# Patient Record
Sex: Female | Born: 1965
Health system: Southern US, Community
[De-identification: ages and names within clinical notes are randomized; demographics above are authoritative.]

## PROBLEM LIST (undated history)

## (undated) DIAGNOSIS — Z9289 Personal history of other medical treatment: Secondary | ICD-10-CM

## (undated) DIAGNOSIS — D649 Anemia, unspecified: Secondary | ICD-10-CM

## (undated) DIAGNOSIS — I82409 Acute embolism and thrombosis of unspecified deep veins of unspecified lower extremity: Secondary | ICD-10-CM

## (undated) DIAGNOSIS — Z9221 Personal history of antineoplastic chemotherapy: Secondary | ICD-10-CM

## (undated) DIAGNOSIS — K219 Gastro-esophageal reflux disease without esophagitis: Secondary | ICD-10-CM

## (undated) DIAGNOSIS — I2699 Other pulmonary embolism without acute cor pulmonale: Secondary | ICD-10-CM

## (undated) DIAGNOSIS — C569 Malignant neoplasm of unspecified ovary: Secondary | ICD-10-CM

## (undated) DIAGNOSIS — Z803 Family history of malignant neoplasm of breast: Secondary | ICD-10-CM

## (undated) DIAGNOSIS — IMO0001 Reserved for inherently not codable concepts without codable children: Secondary | ICD-10-CM

## (undated) DIAGNOSIS — Z923 Personal history of irradiation: Secondary | ICD-10-CM

## (undated) DIAGNOSIS — C801 Malignant (primary) neoplasm, unspecified: Secondary | ICD-10-CM

## (undated) DIAGNOSIS — E790 Hyperuricemia without signs of inflammatory arthritis and tophaceous disease: Secondary | ICD-10-CM

## (undated) DIAGNOSIS — I10 Essential (primary) hypertension: Secondary | ICD-10-CM

## (undated) HISTORY — DX: Personal history of irradiation: Z92.3

## (undated) HISTORY — DX: Family history of malignant neoplasm of breast: Z80.3

## (undated) HISTORY — DX: Malignant neoplasm of unspecified ovary: C56.9

## (undated) HISTORY — PX: PORTACATH PLACEMENT: SHX2246

## (undated) HISTORY — PX: OTHER SURGICAL HISTORY: SHX169

---

## 1997-10-15 ENCOUNTER — Ambulatory Visit: Admission: RE | Admit: 1997-10-15 | Discharge: 1997-10-15 | Payer: Self-pay | Admitting: Family Medicine

## 2006-04-12 ENCOUNTER — Encounter: Admission: RE | Admit: 2006-04-12 | Discharge: 2006-04-12 | Payer: Self-pay | Admitting: Orthopaedic Surgery

## 2010-04-13 ENCOUNTER — Encounter: Payer: Self-pay | Admitting: Orthopaedic Surgery

## 2012-08-02 ENCOUNTER — Encounter (HOSPITAL_COMMUNITY): Payer: Self-pay

## 2012-08-02 ENCOUNTER — Emergency Department (HOSPITAL_COMMUNITY)
Admission: EM | Admit: 2012-08-02 | Discharge: 2012-08-02 | Disposition: A | Discharge status: Home / Self Care | Payer: BC Managed Care – PPO | Attending: Emergency Medicine | Admitting: Emergency Medicine

## 2012-08-02 ENCOUNTER — Emergency Department (HOSPITAL_COMMUNITY): Payer: BC Managed Care – PPO

## 2012-08-02 DIAGNOSIS — M545 Low back pain, unspecified: Secondary | ICD-10-CM | POA: Insufficient documentation

## 2012-08-02 DIAGNOSIS — Y92410 Unspecified street and highway as the place of occurrence of the external cause: Secondary | ICD-10-CM | POA: Insufficient documentation

## 2012-08-02 DIAGNOSIS — Y9389 Activity, other specified: Secondary | ICD-10-CM | POA: Insufficient documentation

## 2012-08-02 DIAGNOSIS — M542 Cervicalgia: Secondary | ICD-10-CM | POA: Insufficient documentation

## 2012-08-02 MED ORDER — OXYCODONE-ACETAMINOPHEN 5-325 MG PO TABS: ORAL_TABLET | ORAL | Status: DC

## 2012-08-02 MED ORDER — OXYCODONE-ACETAMINOPHEN 5-325 MG PO TABS
1.0000 | ORAL_TABLET | Freq: Once | ORAL | Status: AC
  Administered 2012-08-02: 1 via ORAL
  Filled 2012-08-02: qty 1
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
## 2012-08-02 NOTE — ED Notes (Signed)
Patient transported to X-ray 

## 2012-08-04 NOTE — ED Provider Notes (Signed)
Medical screening examination/treatment/procedure(s) were performed by non-physician practitioner and as supervising physician I was immediately available for consultation/collaboration.  Zaynab Chipman, MD 08/04/12 2007 

## 2013-02-05 ENCOUNTER — Emergency Department (HOSPITAL_COMMUNITY)
Admission: EM | Admit: 2013-02-05 | Discharge: 2013-02-05 | Disposition: A | Discharge status: Home / Self Care | Payer: BC Managed Care – PPO | Attending: Emergency Medicine | Admitting: Emergency Medicine

## 2013-02-05 ENCOUNTER — Encounter (HOSPITAL_COMMUNITY): Payer: Self-pay | Admitting: Emergency Medicine

## 2013-02-05 DIAGNOSIS — K219 Gastro-esophageal reflux disease without esophagitis: Secondary | ICD-10-CM | POA: Insufficient documentation

## 2013-02-05 DIAGNOSIS — R141 Gas pain: Secondary | ICD-10-CM | POA: Insufficient documentation

## 2013-02-05 DIAGNOSIS — IMO0001 Reserved for inherently not codable concepts without codable children: Secondary | ICD-10-CM

## 2013-02-05 DIAGNOSIS — Z792 Long term (current) use of antibiotics: Secondary | ICD-10-CM | POA: Insufficient documentation

## 2013-02-05 DIAGNOSIS — R140 Abdominal distension (gaseous): Secondary | ICD-10-CM

## 2013-02-05 DIAGNOSIS — Z79899 Other long term (current) drug therapy: Secondary | ICD-10-CM | POA: Insufficient documentation

## 2013-02-05 DIAGNOSIS — R142 Eructation: Secondary | ICD-10-CM | POA: Insufficient documentation

## 2013-02-05 HISTORY — DX: Gastro-esophageal reflux disease without esophagitis: K21.9

## 2013-02-05 HISTORY — DX: Reserved for inherently not codable concepts without codable children: IMO0001

## 2013-02-05 NOTE — ED Provider Notes (Signed)
CSN: [id number]     Arrival date & time 02/05/13  4098 History   First MD Initiated Contact with Patient 02/05/13 [phone]     Chief Complaint  Patient presents with  . Bloated  . Gastrophageal Reflux   (Consider location/radiation/quality/duration/timing/severity/associated sxs/prior Treatment) Patient is a 47 y.o. female presenting with GI illness. The history is provided by the patient. No language interpreter was used.  GI Problem This is a new problem. The current episode started more than 1 week ago. The problem occurs daily. The problem has been gradually worsening. Pertinent negatives include no chest pain, no abdominal pain, no headaches and no shortness of breath. Associated symptoms comments: Gas, bloating. The symptoms are aggravated by eating. Relieved by: passing flatus. Treatments tried: gas-x, probiotics. The treatment provided mild relief.    Past Medical History  Diagnosis Date  . Reflux    History reviewed. No pertinent past surgical history. History reviewed. No pertinent family history. History  Substance Use Topics  . Smoking status: Never Smoker   . Smokeless tobacco: Not on file  . Alcohol Use: No   OB History   Grav Para Term Preterm Abortions TAB SAB Ect Mult Living                 Review of Systems  Constitutional: Negative for fever, chills, diaphoresis, activity change, appetite change and fatigue.  HENT: Negative for congestion, facial swelling, rhinorrhea and sore throat.   Eyes: Negative for photophobia and discharge.  Respiratory: Negative for cough, chest tightness and shortness of breath.   Cardiovascular: Negative for chest pain, palpitations and leg swelling.  Gastrointestinal: Positive for abdominal distention. Negative for nausea, vomiting, abdominal pain, diarrhea, constipation and blood in stool.  Endocrine: Negative for polydipsia and polyuria.  Genitourinary: Negative for dysuria, frequency, difficulty urinating and pelvic pain.   Musculoskeletal: Negative for arthralgias, back pain, neck pain and neck stiffness.  Skin: Negative for color change and wound.  Allergic/Immunologic: Negative for immunocompromised state.  Neurological: Negative for facial asymmetry, weakness, numbness and headaches.  Hematological: Does not bruise/bleed easily.  Psychiatric/Behavioral: Negative for confusion and agitation.    Allergies  Review of patient's allergies indicates no known allergies.  Home Medications   Current Outpatient Rx  Name  Route  Sig  Dispense  Refill  . cetirizine (ZYRTEC) 10 MG tablet   Oral   Take 10 mg by mouth daily.         Marland Kitchen esomeprazole (NEXIUM) 40 MG capsule   Oral   Take 40 mg by mouth daily before breakfast.         . naproxen sodium (ALEVE) 220 MG tablet   Oral   Take 220 mg by mouth 2 (two) times daily with a meal.         . olmesartan-hydrochlorothiazide (BENICAR HCT) 20-12.5 MG per tablet   Oral   Take 1 tablet by mouth daily.         Marland Kitchen saccharomyces boulardii (FLORASTOR) 250 MG capsule   Oral   Take 250 mg by mouth 2 (two) times daily.         Marland Kitchen azithromycin (ZITHROMAX) 250 MG tablet   Oral   Take 250 mg by mouth daily.          BP 120/66  Pulse 124  Temp(Src) 98.7 F (37.1 C) (Oral)  Resp 16  SpO2 97%  LMP 02/01/2013 Physical Exam  Constitutional: She is oriented to person, place, and time. She appears well-developed and well-nourished. No  distress.  HENT:  Head: Normocephalic and atraumatic.  Mouth/Throat: No oropharyngeal exudate.  Eyes: Pupils are equal, round, and reactive to light.  Neck: Normal range of motion. Neck supple.  Cardiovascular: Normal rate, regular rhythm and normal heart sounds.  Exam reveals no gallop and no friction rub.   No murmur heard. Pulmonary/Chest: Effort normal and breath sounds normal. No respiratory distress. She has no wheezes. She has no rales.  Abdominal: Soft. Bowel sounds are normal. She exhibits no distension and no  mass. There is no tenderness. There is no rebound and no guarding.  Musculoskeletal: Normal range of motion. She exhibits no edema and no tenderness.  Neurological: She is alert and oriented to person, place, and time.  Skin: Skin is warm and dry.  Psychiatric: She has a normal mood and affect.    ED Course  Procedures (including critical care time) Labs Review Labs Reviewed - No data to display Imaging Review No results found.  EKG Interpretation   None       MDM   1. Gas   2. Abdominal bloating    Pt is a 47 y.o. female with Pmhx as above who presents with gas abd bloating for several weeks, worse after eating & at night.  She also has reflux symptoms of burning in epigastrium, chest throat, with acidic taste in mouth at night when laying flat. She had some sharp ab pains 2 weeks ago but has none currently.  She is scheduled to see GI Dec 1st. No pain currently, no fever, d/a or constipation, no urinary symptoms. Had episode of emesis this morning when reflux symptoms were at their worst.  VSS, pt in NAD.  Abdominal exam benign. I doubt acute surgical emergency such as SBO, appendicitis, cholecystitis, diverticulitis.  Also doubt pancreatis, PUD.  I have recommended pt trial OTC Beano before eating, Maalox for symptom control, close f/u with PCP.  Return precautions given for new or worsening symptoms including worsening pain, fever, inability to tolerate PO.          Shanna Cisco, MD 02/05/13 [phone]

## 2013-02-05 NOTE — ED Notes (Signed)
Pt states she had been taking probiotics.   Has hx of reflux.  Started probiotics to help stomach.  Pt was having diarrhea and felt "her system was off".  Pt states now burping, indigestion and increased gas pain noted.

## 2013-02-11 ENCOUNTER — Emergency Department (HOSPITAL_COMMUNITY)
Admission: EM | Admit: 2013-02-11 | Discharge: 2013-02-11 | Disposition: A | Discharge status: Home / Self Care | Payer: BC Managed Care – PPO | Attending: Emergency Medicine | Admitting: Emergency Medicine

## 2013-02-11 ENCOUNTER — Emergency Department (HOSPITAL_COMMUNITY): Payer: BC Managed Care – PPO

## 2013-02-11 ENCOUNTER — Encounter (HOSPITAL_COMMUNITY): Payer: Self-pay | Admitting: Emergency Medicine

## 2013-02-11 DIAGNOSIS — Z79899 Other long term (current) drug therapy: Secondary | ICD-10-CM | POA: Insufficient documentation

## 2013-02-11 DIAGNOSIS — J189 Pneumonia, unspecified organism: Secondary | ICD-10-CM

## 2013-02-11 DIAGNOSIS — R000 Tachycardia, unspecified: Secondary | ICD-10-CM | POA: Insufficient documentation

## 2013-02-11 DIAGNOSIS — I10 Essential (primary) hypertension: Secondary | ICD-10-CM | POA: Insufficient documentation

## 2013-02-11 DIAGNOSIS — J159 Unspecified bacterial pneumonia: Secondary | ICD-10-CM | POA: Insufficient documentation

## 2013-02-11 DIAGNOSIS — K219 Gastro-esophageal reflux disease without esophagitis: Secondary | ICD-10-CM | POA: Insufficient documentation

## 2013-02-11 LAB — RAPID STREP SCREEN (MED CTR MEBANE ONLY): Streptococcus, Group A Screen (Direct): NEGATIVE

## 2013-02-11 MED ORDER — LEVOFLOXACIN 750 MG PO TABS: 750.0000 mg | ORAL_TABLET | Freq: Every day | ORAL | Status: DC

## 2013-02-11 MED ORDER — BENZONATATE 100 MG PO CAPS: 100.0000 mg | ORAL_CAPSULE | Freq: Three times a day (TID) | ORAL | Status: DC

## 2013-02-11 MED ORDER — LEVOFLOXACIN 500 MG PO TABS
750.0000 mg | ORAL_TABLET | Freq: Once | ORAL | Status: AC
  Administered 2013-02-11: 750 mg via ORAL
  Filled 2013-02-11: qty 2

## 2013-02-11 MED ORDER — ALBUTEROL SULFATE HFA 108 (90 BASE) MCG/ACT IN AERS
2.0000 | INHALATION_SPRAY | Freq: Once | RESPIRATORY_TRACT | Status: AC
  Administered 2013-02-11: 2 via RESPIRATORY_TRACT
  Filled 2013-02-11: qty 6.7

## 2013-02-11 MED ORDER — ACETAMINOPHEN 325 MG PO TABS
650.0000 mg | ORAL_TABLET | Freq: Once | ORAL | Status: AC
  Administered 2013-02-11: 650 mg via ORAL
  Filled 2013-02-11: qty 2

## 2013-02-11 NOTE — ED Notes (Signed)
Pharmacy advised that antibiotic is not available in the department

## 2013-02-11 NOTE — ED Provider Notes (Signed)
CSN: [id number]     Arrival date & time 02/11/13  1633 History  This chart was scribed for Jaynie Crumble, PA-C, working with Roney Marion, MD by Ardelia Mems, ED Scribe. This patient was seen in room WTR5/WTR5 and the patient's care was started at 7:24 PM.   Chief Complaint  Patient presents with  . Cough    productive cough x 1 week  . Sore Throat    The history is provided by the patient. No language interpreter was used.    HPI Comments: Caitlyn Higgins is a 47 y.o. female with a history of HTN who presents to the Emergency Department complaining of a gradually worsening cough over the past week. She states that her cough was at first non-productive until 2 days ago, when it became productive of thick green phlegm. She also reports an associated sore throat, postnasal drip, sinus pressure and chills. She states that she has taken Mucinex without relief. She denies fever or any other symptoms. She states that she has no medication allergies. She denies any history of smoking.   Past Medical History  Diagnosis Date  . Reflux    History reviewed. No pertinent past surgical history. Family History  Problem Relation Age of Onset  . Diabetes Mother   . Hypertension Mother   . Hypertension Father   . Diabetes Father    History  Substance Use Topics  . Smoking status: Never Smoker   . Smokeless tobacco: Not on file  . Alcohol Use: No   OB History   Grav Para Term Preterm Abortions TAB SAB Ect Mult Living                 Review of Systems  Constitutional: Positive for chills. Negative for fever.  HENT: Positive for postnasal drip, sinus pressure and sore throat.   Respiratory: Positive for cough.   All other systems reviewed and are negative.   Allergies  Review of patient's allergies indicates no known allergies.  Home Medications   Current Outpatient Rx  Name  Route  Sig  Dispense  Refill  . acetaminophen (TYLENOL) 500 MG tablet   Oral   Take 1,000 mg by  mouth every 6 (six) hours as needed for mild pain.         . cetirizine (ZYRTEC) 10 MG tablet   Oral   Take 10 mg by mouth every evening.          Marland Kitchen dextromethorphan-guaiFENesin (MUCINEX DM) 30-600 MG per 12 hr tablet   Oral   Take 1 tablet by mouth 2 (two) times daily as needed for cough.         . esomeprazole (NEXIUM) 40 MG capsule   Oral   Take 40 mg by mouth daily before breakfast.         . lidocaine (LIDODERM) 5 %   Transdermal   Place 1 patch onto the skin daily as needed (pain). Remove & Discard patch within 12 hours or as directed by MD         . naproxen sodium (ALEVE) 220 MG tablet   Oral   Take 220 mg by mouth 2 (two) times daily as needed (pain).          Marland Kitchen olmesartan-hydrochlorothiazide (BENICAR HCT) 20-12.5 MG per tablet   Oral   Take 1 tablet by mouth every morning.          . pseudoephedrine-acetaminophen (TYLENOL SINUS) 30-500 MG TABS   Oral   Take  1 tablet by mouth every 4 (four) hours as needed (congestion).         . benzonatate (TESSALON) 100 MG capsule   Oral   Take 1 capsule (100 mg total) by mouth every 8 (eight) hours.   21 capsule   0   . levofloxacin (LEVAQUIN) 750 MG tablet   Oral   Take 1 tablet (750 mg total) by mouth daily.   4 tablet   0    Triage Vitals: BP 115/76  Pulse 119  Temp(Src) 99.2 F (37.3 C) (Oral)  Resp 20  SpO2 97%  LMP 02/01/2013  Physical Exam  Nursing note and vitals reviewed. Constitutional: She is oriented to person, place, and time. She appears well-developed and well-nourished. No distress.  HENT:  Head: Normocephalic and atraumatic.  Mouth/Throat: Uvula is midline, oropharynx is clear and moist and mucous membranes are normal. No oropharyngeal exudate, posterior oropharyngeal edema, posterior oropharyngeal erythema or tonsillar abscesses.  Sinuses are non-tender.  Eyes: EOM are normal.  Neck: Normal range of motion. Neck supple.  Cardiovascular: Regular rhythm, normal heart sounds and  intact distal pulses.   tachycardic  Pulmonary/Chest: Effort normal.  Crackles in the right lung.  Musculoskeletal: Normal range of motion.  Neurological: She is alert and oriented to person, place, and time.  Skin: Skin is warm and dry.  Psychiatric: She has a normal mood and affect. Her behavior is normal.    ED Course  Procedures (including critical care time)  DIAGNOSTIC STUDIES: Oxygen Saturation is 97% on RA, Normal by my interpretation.    COORDINATION OF CARE: 6:19 PM- Discussed CXR findings and advised pt that she has pneumonia in the base of her right lung. Dsicussed plan for pt to receive medications in the ED. Discussed treatment plan with pt at bedside and pt agreed to plan.    Medications  albuterol (PROVENTIL HFA;VENTOLIN HFA) 108 (90 BASE) MCG/ACT inhaler 2 puff (2 puffs Inhalation Given 02/11/13 1825)  acetaminophen (TYLENOL) tablet 650 mg (650 mg Oral Given 02/11/13 1825)  levofloxacin (LEVAQUIN) tablet 750 mg (750 mg Oral Given 02/11/13 1908)    Labs Review Labs Reviewed  RAPID STREP SCREEN  CULTURE, GROUP A STREP   Imaging Review Dg Chest 2 View  02/11/2013   CLINICAL DATA:  Cough  EXAM: CHEST  2 VIEW  COMPARISON:  None.  FINDINGS: There is patchy consolidation of right lung base. Lung volumes are low. There is no pulmonary edema. The aorta is tortuous. The heart size is normal. There is scoliosis of spine.  IMPRESSION: Right lung base pneumonia.   Electronically Signed   By: Sherian Rein M.D.   On: 02/11/2013 17:59    EKG Interpretation   None       MDM   1. CAP (community acquired pneumonia)     Pt with cough, congestion, fever, chills. She is tachycardic, otherwise normal VS. She has no other medical problems other than hypertension. CXR obtained which showed pneumonia. Will start on levaquin, given inhaler in ED, PO fluids, tylenol. Will recheck HR.   7:24 PM HR down to 100. Pt feeling better. Will d/c home with levaquin, albuterol, tessalon.  Follow up with pcp.   Filed Vitals:   02/11/13 1740 02/11/13 1815 02/11/13 1913 02/11/13 1933  BP: 115/76   119/68  Pulse: 119 117 102 100  Temp: 99.2 F (37.3 C)     TempSrc: Oral     Resp: 20   18  SpO2: 97% 100%  I personally performed the services described in this documentation, which was scribed in my presence. The recorded information has been reviewed and is accurate.   Lottie Mussel, PA-C 02/11/13 1959

## 2013-02-11 NOTE — ED Notes (Signed)
Pt c/o sore throat, productive cough-thick green secretions. Chills x 2 days. Denies fever

## 2013-02-14 LAB — CULTURE, GROUP A STREP

## 2013-02-15 NOTE — ED Provider Notes (Signed)
Medical screening examination/treatment/procedure(s) were performed by non-physician practitioner and as supervising physician I was immediately available for consultation/collaboration.  EKG Interpretation   None         Viviene Thurston J Marcine Gadway, MD 02/15/13 0724 

## 2013-02-20 ENCOUNTER — Other Ambulatory Visit: Payer: Self-pay | Admitting: Gastroenterology

## 2013-02-20 DIAGNOSIS — R634 Abnormal weight loss: Secondary | ICD-10-CM

## 2013-02-22 ENCOUNTER — Encounter (HOSPITAL_COMMUNITY): Payer: Self-pay | Admitting: Emergency Medicine

## 2013-02-22 ENCOUNTER — Emergency Department (HOSPITAL_COMMUNITY): Payer: BC Managed Care – PPO

## 2013-02-22 ENCOUNTER — Ambulatory Visit
Admission: RE | Admit: 2013-02-22 | Discharge: 2013-02-22 | Disposition: A | Discharge status: Home / Self Care | Payer: BC Managed Care – PPO | Source: Ambulatory Visit | Attending: Gastroenterology | Admitting: Gastroenterology

## 2013-02-22 ENCOUNTER — Inpatient Hospital Stay (HOSPITAL_COMMUNITY)
Admission: EM | Admit: 2013-02-22 | Discharge: 2013-02-23 | DRG: 299 | Disposition: A | Discharge status: Other Acute Inpatient Hospital | Payer: BC Managed Care – PPO | Attending: Internal Medicine | Admitting: Internal Medicine

## 2013-02-22 DIAGNOSIS — I82403 Acute embolism and thrombosis of unspecified deep veins of lower extremity, bilateral: Secondary | ICD-10-CM

## 2013-02-22 DIAGNOSIS — R1900 Intra-abdominal and pelvic swelling, mass and lump, unspecified site: Secondary | ICD-10-CM | POA: Diagnosis present

## 2013-02-22 DIAGNOSIS — N19 Unspecified kidney failure: Secondary | ICD-10-CM | POA: Diagnosis present

## 2013-02-22 DIAGNOSIS — I82409 Acute embolism and thrombosis of unspecified deep veins of unspecified lower extremity: Secondary | ICD-10-CM | POA: Diagnosis present

## 2013-02-22 DIAGNOSIS — D649 Anemia, unspecified: Secondary | ICD-10-CM | POA: Diagnosis present

## 2013-02-22 DIAGNOSIS — I824Y9 Acute embolism and thrombosis of unspecified deep veins of unspecified proximal lower extremity: Principal | ICD-10-CM | POA: Diagnosis present

## 2013-02-22 DIAGNOSIS — N838 Other noninflammatory disorders of ovary, fallopian tube and broad ligament: Secondary | ICD-10-CM | POA: Diagnosis present

## 2013-02-22 DIAGNOSIS — I82401 Acute embolism and thrombosis of unspecified deep veins of right lower extremity: Secondary | ICD-10-CM

## 2013-02-22 DIAGNOSIS — I2699 Other pulmonary embolism without acute cor pulmonale: Secondary | ICD-10-CM

## 2013-02-22 DIAGNOSIS — E43 Unspecified severe protein-calorie malnutrition: Secondary | ICD-10-CM | POA: Insufficient documentation

## 2013-02-22 DIAGNOSIS — I10 Essential (primary) hypertension: Secondary | ICD-10-CM | POA: Diagnosis present

## 2013-02-22 DIAGNOSIS — D638 Anemia in other chronic diseases classified elsewhere: Secondary | ICD-10-CM | POA: Diagnosis present

## 2013-02-22 DIAGNOSIS — K219 Gastro-esophageal reflux disease without esophagitis: Secondary | ICD-10-CM | POA: Diagnosis present

## 2013-02-22 DIAGNOSIS — R634 Abnormal weight loss: Secondary | ICD-10-CM

## 2013-02-22 DIAGNOSIS — N839 Noninflammatory disorder of ovary, fallopian tube and broad ligament, unspecified: Secondary | ICD-10-CM

## 2013-02-22 DIAGNOSIS — J189 Pneumonia, unspecified organism: Secondary | ICD-10-CM | POA: Diagnosis present

## 2013-02-22 HISTORY — DX: Essential (primary) hypertension: I10

## 2013-02-22 LAB — CBC WITH DIFFERENTIAL/PLATELET
Basophils Relative: 0 % (ref 0–1)
Lymphs Abs: 1.4 10*3/uL (ref 0.7–4.0)
Monocytes Relative: 8 % (ref 3–12)
RDW: 17.1 % — ABNORMAL HIGH (ref 11.5–15.5)

## 2013-02-22 LAB — POCT I-STAT, CHEM 8
Calcium, Ion: 1.22 mmol/L (ref 1.12–1.23)
Creatinine, Ser: 2.1 mg/dL — ABNORMAL HIGH (ref 0.50–1.10)
Glucose, Bld: 98 mg/dL (ref 70–99)
HCT: 24 % — ABNORMAL LOW (ref 36.0–46.0)
Hemoglobin: 8.2 g/dL — ABNORMAL LOW (ref 12.0–15.0)
Potassium: 3.8 mEq/L (ref 3.5–5.1)
TCO2: 26 mmol/L (ref 0–100)

## 2013-02-22 LAB — COMPREHENSIVE METABOLIC PANEL
ALT: 5 U/L (ref 0–35)
Alkaline Phosphatase: 83 U/L (ref 39–117)
CO2: 23 mEq/L (ref 19–32)
Chloride: 92 mEq/L — ABNORMAL LOW (ref 96–112)
Glucose, Bld: 94 mg/dL (ref 70–99)
Potassium: 3.8 mEq/L (ref 3.5–5.1)
Sodium: 131 mEq/L — ABNORMAL LOW (ref 135–145)
Total Bilirubin: 0.5 mg/dL (ref 0.3–1.2)

## 2013-02-22 LAB — PROTIME-INR: INR: 1.03 (ref 0.00–1.49)

## 2013-02-22 LAB — APTT: aPTT: 32 seconds (ref 24–37)

## 2013-02-22 MED ORDER — HYDRALAZINE HCL 20 MG/ML IJ SOLN: 10.0000 mg | INTRAMUSCULAR | Status: DC | PRN

## 2013-02-22 MED ORDER — ACETAMINOPHEN 325 MG PO TABS: 650.0000 mg | ORAL_TABLET | Freq: Four times a day (QID) | ORAL | Status: DC | PRN

## 2013-02-22 MED ORDER — HEPARIN BOLUS VIA INFUSION
5000.0000 [IU] | Freq: Once | INTRAVENOUS | Status: AC
  Administered 2013-02-22: 5000 [IU] via INTRAVENOUS
  Filled 2013-02-22: qty 5000

## 2013-02-22 MED ORDER — SODIUM CHLORIDE 0.9 % IV SOLN
INTRAVENOUS | Status: DC
  Administered 2013-02-23: 06:00:00 via INTRAVENOUS
  Administered 2013-02-23: 75 mL/h via INTRAVENOUS

## 2013-02-22 MED ORDER — SODIUM CHLORIDE 0.9 % IJ SOLN: 3.0000 mL | Freq: Two times a day (BID) | INTRAMUSCULAR | Status: DC

## 2013-02-22 MED ORDER — HEPARIN (PORCINE) IN NACL 100-0.45 UNIT/ML-% IJ SOLN
1750.0000 [IU]/h | INTRAMUSCULAR | Status: DC
  Administered 2013-02-22: 1500 [IU]/h via INTRAVENOUS
  Administered 2013-02-23: 1750 [IU]/h via INTRAVENOUS
  Filled 2013-02-22 (×4): qty 250

## 2013-02-22 MED ORDER — ONDANSETRON HCL 4 MG PO TABS: 4.0000 mg | ORAL_TABLET | Freq: Four times a day (QID) | ORAL | Status: DC | PRN

## 2013-02-22 MED ORDER — ACETAMINOPHEN 650 MG RE SUPP: 650.0000 mg | Freq: Four times a day (QID) | RECTAL | Status: DC | PRN

## 2013-02-22 MED ORDER — SODIUM CHLORIDE 0.9 % IV BOLUS (SEPSIS)
500.0000 mL | Freq: Once | INTRAVENOUS | Status: AC
  Administered 2013-02-22: 500 mL via INTRAVENOUS

## 2013-02-22 MED ORDER — ONDANSETRON HCL 4 MG/2ML IJ SOLN: 4.0000 mg | Freq: Four times a day (QID) | INTRAMUSCULAR | Status: DC | PRN

## 2013-02-22 MED ORDER — IOHEXOL 240 MG/ML SOLN
125.0000 mL | Freq: Once | INTRAMUSCULAR | Status: AC | PRN
  Administered 2013-02-22: 125 mL via INTRAVENOUS

## 2013-02-22 NOTE — ED Provider Notes (Signed)
CSN: [id number]     Arrival date & time 02/22/13  1753 History   First MD Initiated Contact with Patient 02/22/13 1810     Chief Complaint  Patient presents with  . Weight Loss  . Shortness of Breath   (Consider location/radiation/quality/duration/timing/severity/associated sxs/prior Treatment) Patient is a 47 y.o. female presenting with shortness of breath. The history is provided by the patient.  Shortness of Breath Associated symptoms: abdominal pain   Associated symptoms: no chest pain, no headaches, no rash and no vomiting    patient presents with an abnormal CT finding. She was seen at gastroenterology for abdominal pain and difficulty eating. She states it has been going on for a couple months. She states she's been eating less. She states she felt as if she got full early. She was attributing it to her gastric reflux. She has lost around 30 pounds in the last couple weeks. No dysuria. She's had some shortness of breath but states that improved after antibiotics. She was recently seen and diagnosed with pneumonia. She had an outpatient CT scan done that showed a likely ovarian tumor. It also has extensive right leg DVT and likely pulmonary embolism. She had no previous known malignancy. She's not had chest pain.  Past Medical History  Diagnosis Date  . Reflux   . Hypertension    History reviewed. No pertinent past surgical history. Family History  Problem Relation Age of Onset  . Diabetes Mother   . Hypertension Mother   . Hypertension Father   . Diabetes Father   . Colon cancer Father   . Breast cancer Maternal Aunt    History  Substance Use Topics  . Smoking status: Never Smoker   . Smokeless tobacco: Not on file  . Alcohol Use: No   OB History   Grav Para Term Preterm Abortions TAB SAB Ect Mult Living                 Review of Systems  Constitutional: Positive for appetite change and unexpected weight change. Negative for activity change.  Eyes: Negative for pain.   Respiratory: Positive for shortness of breath. Negative for chest tightness.   Cardiovascular: Negative for chest pain and leg swelling.  Gastrointestinal: Positive for nausea and abdominal pain. Negative for vomiting and diarrhea.  Genitourinary: Negative for flank pain.  Musculoskeletal: Negative for back pain and neck stiffness.  Skin: Negative for rash.  Neurological: Negative for weakness, numbness and headaches.  Psychiatric/Behavioral: Negative for behavioral problems.    Allergies  Review of patient's allergies indicates no known allergies.  Home Medications   Current Outpatient Rx  Name  Route  Sig  Dispense  Refill  . acetaminophen (TYLENOL) 500 MG tablet   Oral   Take 1,000 mg by mouth every 6 (six) hours as needed for mild pain.         . benzonatate (TESSALON) 100 MG capsule   Oral   Take 1 capsule (100 mg total) by mouth every 8 (eight) hours.   21 capsule   0   . cetirizine (ZYRTEC) 10 MG tablet   Oral   Take 10 mg by mouth every evening.          Marland Kitchen esomeprazole (NEXIUM) 40 MG capsule   Oral   Take 40 mg by mouth daily before breakfast.         . naproxen sodium (ALEVE) 220 MG tablet   Oral   Take 220 mg by mouth 2 (two) times daily as  needed (pain).          Marland Kitchen olmesartan-hydrochlorothiazide (BENICAR HCT) 20-12.5 MG per tablet   Oral   Take 1 tablet by mouth every morning.          Marland Kitchen levofloxacin (LEVAQUIN) 750 MG tablet   Oral   Take 1 tablet (750 mg total) by mouth daily.   4 tablet   0   . lidocaine (LIDODERM) 5 %   Transdermal   Place 1 patch onto the skin daily as needed (pain). Remove & Discard patch within 12 hours or as directed by MD          BP 109/65  Pulse 115  Temp(Src) 97.9 F (36.6 C) (Oral)  Resp 20  Ht 5' 6.93" (1.7 m)  Wt 234 lb 4.8 oz (106.278 kg)  BMI 36.77 kg/m2  SpO2 96%  LMP 02/21/2013 Physical Exam  Nursing note and vitals reviewed. Constitutional: She is oriented to person, place, and time. She  appears well-developed and well-nourished.  HENT:  Head: Normocephalic and atraumatic.  Eyes: EOM are normal. Pupils are equal, round, and reactive to light.  Neck: Normal range of motion. Neck supple.  Cardiovascular: Normal rate, regular rhythm and normal heart sounds.   No murmur heard. Tachycardia  Pulmonary/Chest: Effort normal and breath sounds normal. No respiratory distress. She has no wheezes. She has no rales.  Abdominal: Soft. Bowel sounds are normal. She exhibits mass. She exhibits no distension. There is no tenderness. There is no rebound and no guarding.  Mass that extends from the upper abdomen down to the pelvis.  Musculoskeletal: Normal range of motion. She exhibits edema.  Edema to right lower extremity greater than left lower extremity.  Neurological: She is alert and oriented to person, place, and time. No cranial nerve deficit.  Skin: Skin is warm and dry.  Psychiatric: She has a normal mood and affect. Her speech is normal.    ED Course  Procedures (including critical care time) Labs Review Labs Reviewed  CBC WITH DIFFERENTIAL - Abnormal; Notable for the following:    WBC 19.7 (*)    RBC 2.84 (*)    Hemoglobin 7.5 (*)    HCT 23.6 (*)    RDW 17.1 (*)    Platelets 402 (*)    Neutrophils Relative % 84 (*)    Lymphocytes Relative 7 (*)    Neutro Abs 16.5 (*)    Monocytes Absolute 1.6 (*)    All other components within normal limits  COMPREHENSIVE METABOLIC PANEL - Abnormal; Notable for the following:    Sodium 131 (*)    Chloride 92 (*)    BUN 34 (*)    Creatinine, Ser 1.70 (*)    Total Protein 9.5 (*)    Albumin 2.8 (*)    GFR calc non Af Amer 34 (*)    GFR calc Af Amer 40 (*)    All other components within normal limits  POCT I-STAT, CHEM 8 - Abnormal; Notable for the following:    BUN 36 (*)    Creatinine, Ser 2.10 (*)    Hemoglobin 8.2 (*)    HCT 24.0 (*)    All other components within normal limits  PROTIME-INR  APTT  URINALYSIS, ROUTINE W  REFLEX MICROSCOPIC  HEPARIN LEVEL (UNFRACTIONATED)  CBC  POCT I-STAT TROPONIN I   Imaging Review Dg Chest 2 View  02/22/2013   CLINICAL DATA:  Abdominal pain  EXAM: CHEST  2 VIEW  COMPARISON:  02/11/2013  FINDINGS: Cardiomediastinal  silhouette is stable. Persistent patchy infiltrate/ pneumonia right base. No pulmonary edema.  IMPRESSION: Persistent patchy infiltrate/ pneumonia right base. No pulmonary edema.   Electronically Signed   By: Natasha Mead M.D.   On: 02/22/2013 20:50   Ct Abdomen Pelvis W Contrast  02/22/2013   CLINICAL DATA:  Abdominal fullness and distention. Reflux. Weight loss.  EXAM: CT ABDOMEN AND PELVIS WITH CONTRAST  TECHNIQUE: Multidetector CT imaging of the abdomen and pelvis was performed using the standard protocol following bolus administration of intravenous contrast.  CONTRAST:  OMNIPAQUE IOHEXOL 240 MG/ML SOLN  COMPARISON:  No priors.  FINDINGS: Lung Bases: Pleural-based wedge-shaped mass like opacity in the periphery of the right lower lobe (image 3 of series 4). Patchy ill-defined of peribronchovascular ground-glass attenuation in the right lower lobe. Trace right pleural effusion layering dependently. Multiple small soft tissue nodules are noted in the inferior aspect of the anterior mediastinum, immediately above the diaphragm, likely to represent borderline enlarged lymph nodes. The largest of these measures only 8 mm in short axis. Prominence low right internal mammary lymph node measuring 6 mm in short axis. Patulous distal esophagus.  Abdomen/Pelvis: 5.2 x 1.8 cm heterogeneously enhancing lesion associated with the liver capsule overlying the right lobe of the liver (image 18 of series 3). No intraparenchymal lesions identified within the liver at this time. The appearance of the gallbladder, pancreas, spleen, bilateral adrenal glands and bilateral kidneys is unremarkable.  There is an enormous mass which appears to arise from the pelvis extending cephalad into the  mid to upper abdomen, measuring approximately 24.1 x 16.8 x 19.9 cm. This mass appears predominantly cystic, but has innumerable internal septations and irregular areas of enhancing soft tissue within this lesion. This mass is immediately adjacent to the fundus of the uterus, however, this does not appear to arise from the uterus, and likely arises from one or both of the ovaries. The uterus itself is mildly heterogeneous in appearance, likely related to the presence of fibroids. There is some indistinctness in the omentum adjacent to the mass, best demonstrated on image 56 of series 3, suspicious for early omental disease. Similar findings are also present in the left lower quadrant on image 65 of series 3. Small volume of ascites, most pronounced adjacent to the liver.  No pathologic distention of small bowel. No pneumoperitoneum. Several enlarged right external iliac lymph nodes are noted, measuring up to 1.3 cm in short axis. Notably, the right common femoral vein and visualized portions of the profunda femoral and superficial femoral veins on the right side appear distended, and are filled with nonenhancing material, most compatible with a large volume of deep venous thrombosis. The right external iliac vein and remaining deep veins of the pelvis are otherwise patent bilaterally. Notably, this pelvic mass does appear to significantly compress and nearly completely occlude the proximal inferior vena cava.  Musculoskeletal: There are no aggressive appearing lytic or blastic lesions noted in the visualized portions of the skeleton.  IMPRESSION: 1. 24.1 x 16.8 x 19.9 cm complex cystic mass which appears to arise from the pelvis extending into the mid upper abdomen is highly suspicious for an ovarian neoplasm. There is evidence of early omental disease, as well as a serosal implant upon the surface of the liver, with small volume of probable malignant ascites, and potential transdiaphragmatic extension based on  small lymph nodes immediately above the diaphragm. 2. Importantly, this pelvic mass is causing severe compression of the proximal inferior vena cava, and there  is a large volume of deep venous thrombosis in the visualized portion of the right thigh involving right superficial femoral, profunda femoral and common femoral veins. Additionally, there is a mass like peripheral wedge-shaped opacity in the right lower lobe. In the setting of deep venous thrombosis, this wedge-shaped opacity would be most concerning for potential pulmonary infarction. Associated with this, there is a trace right pleural effusion. Unfortunately, evaluation of the pulmonary arteries in this region is severely limited by suboptimal opacification with contrast and patient respiratory motion. Further evaluation with PE protocol CT scan is strongly recommended at this time. 3. Additional incidental findings, as above. Critical Value/emergent results were called by telephone at the time of interpretation on 02/22/2013 at 5:03 PM to Dr[name], who verbally acknowledged these results.   Electronically Signed   By: Trudie Reed M.D.   On: 02/22/2013 17:03    EKG Interpretation    Date/Time:  Wednesday February 22 2013 18:58:10 EST Ventricular Rate:  109 PR Interval:  173 QRS Duration: 84 QT Interval:  344 QTC Calculation: 463 R Axis:   -40 Text Interpretation:  Sinus tachycardia Consider left atrial enlargement Left anterior fascicular block Consider anterior infarct Minimal ST depression, lateral leads Confirmed by Jaze Rodino  MD, Jemel Ono ([id number]) on 02/22/2013 7:48:18 PM            MDM   1. Ovarian mass   2. DVT (deep venous thrombosis), right   3. Pulmonary embolism    Patient with ovarian mass, DVT and likely pulmonary embolism. Patient's creatinine is elevated at home not get a CT n.p.o. at this time since she had a dye load earlier today. Will be admitted to internal medicine.    Juliet Rude. Rubin Payor,  MD 02/22/13 2125

## 2013-02-22 NOTE — Progress Notes (Signed)
ANTICOAGULATION CONSULT NOTE - Follow Up Consult  Pharmacy Consult for Heparin Indication: pulmonary embolus and DVT  No Known Allergies  Patient Measurements: Height: 5' 6.93" (170 cm) Weight: 234 lb 4.8 oz (106.278 kg) IBW/kg (Calculated) : 61.44 Heparin Dosing Weight: 85 kg  Vital Signs: Temp: 97.9 F (36.6 C) (12/03 1802) Temp src: Oral (12/03 1802) BP: 127/66 mmHg (12/03 1802) Pulse Rate: 120 (12/03 1802)  Labs:  Recent Labs  02/22/13 1837 02/22/13 1910  HGB 7.5* 8.2*  HCT 23.6* 24.0*  PLT 402*  --   APTT 32  --   LABPROT 13.3  --   INR 1.03  --   CREATININE 1.70* 2.10*    Estimated Creatinine Clearance: 40.6 ml/min (by C-G formula based on Cr of 2.1).   Medications:  Infusions:   Assessment: 47 year old female admitted with a new diagnosis of ovarian tumor, right leg DVT, and pulmonary embolus.  She is to begin anticoagulation with Heparin.  Note her creatinine is elevated and her hemoglobin is low.  Goal of Therapy:  Heparin level 0.3-0.7 units/ml Monitor platelets by anticoagulation protocol: Yes   Plan:  Heparin 5000 units IV bolus Start Heparin infusion at 1500 units/hr Check Heparin level in 6 hours Daily Heparin level and CBC  Estella Husk, Pharm.D., BCPS, AAHIVP Clinical Pharmacist Phone [phone] or [phone] 02/22/2013, 8:15 PM

## 2013-02-22 NOTE — H&P (Signed)
[facility] History and Physical  Caitlyn Higgins OZH:[id number] DOB: 01/15/1966 DOA: 02/22/2013  Referring physician: ER physician. PCP: Caitlyn Blamer, MD   Chief Complaint: Abnormal CAT scan.  HPI: Caitlyn Higgins is a 47 y.o. female with history of hypertension and anemia had gone to gastroenterologist as patient was feeling abdominal bloating and uneasiness for last few days. Gastroenterologist Dr. Bosie Clos had ordered CAT scan which shows large pelvic mass concerning for ovarian cancer with metastasis. In addition is also shows right lower extremity DVT with possible pulmonary infarcts. Patient had come to the ER last week for cough and at that time patient was empirically treated for pneumonia with Levaquin. Presently patient denies any chest pain or shortness of breath nausea vomiting abdominal pain or diarrhea. Patient states that the last few months patient has been having weight loss.  Review of Systems: As presented in the history of presenting illness, rest negative.  Past Medical History  Diagnosis Date  . Reflux   . Hypertension    History reviewed. No pertinent past surgical history. Social History:  reports that she has never smoked. She does not have any smokeless tobacco history on file. She reports that she does not drink alcohol or use illicit drugs. Where does patient live home. Can patient participate in ADLs? Yes.  No Known Allergies  Family History:  Family History  Problem Relation Age of Onset  . Diabetes Mother   . Hypertension Mother   . Hypertension Father   . Diabetes Father   . Colon cancer Father   . Breast cancer Maternal Aunt       Prior to Admission medications   Medication Sig Start Date End Date Taking? Authorizing Provider  acetaminophen (TYLENOL) 500 MG tablet Take 1,000 mg by mouth every 6 (six) hours as needed for mild pain.   Yes Historical Provider, MD  benzonatate (TESSALON) 100 MG capsule Take 1 capsule (100 mg  total) by mouth every 8 (eight) hours. 02/11/13  Yes Tatyana A Kirichenko, PA-C  cetirizine (ZYRTEC) 10 MG tablet Take 10 mg by mouth every evening.    Yes Historical Provider, MD  esomeprazole (NEXIUM) 40 MG capsule Take 40 mg by mouth daily before breakfast.   Yes Historical Provider, MD  naproxen sodium (ALEVE) 220 MG tablet Take 220 mg by mouth 2 (two) times daily as needed (pain).    Yes Historical Provider, MD  olmesartan-hydrochlorothiazide (BENICAR HCT) 20-12.5 MG per tablet Take 1 tablet by mouth every morning.    Yes Historical Provider, MD  levofloxacin (LEVAQUIN) 750 MG tablet Take 1 tablet (750 mg total) by mouth daily. 02/11/13   Tatyana A Kirichenko, PA-C  lidocaine (LIDODERM) 5 % Place 1 patch onto the skin daily as needed (pain). Remove & Discard patch within 12 hours or as directed by MD    Historical Provider, MD    Physical Exam: Filed Vitals:   02/22/13 1915 02/22/13 1930 02/22/13 1945 02/22/13 2000  BP: 111/64 119/67 119/65 109/65  Pulse: 115 117 113 115  Temp:      TempSrc:      Resp: 14 20 18 20   Height:      Weight:      SpO2: 99% 96% 97% 96%     General:  Well-developed and nourished.  Eyes: Mild pallor no icterus.  ENT: No discharge from ears eyes nose mouth.  Neck: No mass felt.  Cardiovascular: S1-S2 heard.  Respiratory: No rhonchi or crepitations.  Abdomen: Distended abdomen. No guarding or rigidity.  Skin: No rash.  Musculoskeletal: Right lower extremity is swollen down to the foot from the groin.  Psychiatric: Appears normal.  Neurologic: Alert awake oriented to time place and person. Moves all extremities.  Labs on Admission:  Basic Metabolic Panel:  Recent Labs Lab 02/22/13 1837 02/22/13 1910  NA 131* 136  K 3.8 3.8  CL 92* 98  CO2 23  --   GLUCOSE 94 98  BUN 34* 36*  CREATININE 1.70* 2.10*  CALCIUM 9.3  --    Liver Function Tests:  Recent Labs Lab 02/22/13 1837  AST 16  ALT 5  ALKPHOS 83  BILITOT 0.5  PROT 9.5*   ALBUMIN 2.8*   No results found for this basename: LIPASE, AMYLASE,  in the last 168 hours No results found for this basename: AMMONIA,  in the last 168 hours CBC:  Recent Labs Lab 02/22/13 1837 02/22/13 1910  WBC 19.7*  --   NEUTROABS 16.5*  --   HGB 7.5* 8.2*  HCT 23.6* 24.0*  MCV 83.1  --   PLT 402*  --    Cardiac Enzymes: No results found for this basename: CKTOTAL, CKMB, CKMBINDEX, TROPONINI,  in the last 168 hours  BNP (last 3 results) No results found for this basename: PROBNP,  in the last 8760 hours CBG: No results found for this basename: GLUCAP,  in the last 168 hours  Radiological Exams on Admission: Dg Chest 2 View  02/22/2013   CLINICAL DATA:  Abdominal pain  EXAM: CHEST  2 VIEW  COMPARISON:  02/11/2013  FINDINGS: Cardiomediastinal silhouette is stable. Persistent patchy infiltrate/ pneumonia right base. No pulmonary edema.  IMPRESSION: Persistent patchy infiltrate/ pneumonia right base. No pulmonary edema.   Electronically Signed   By: Natasha Mead M.D.   On: 02/22/2013 20:50   Ct Abdomen Pelvis W Contrast  02/22/2013   CLINICAL DATA:  Abdominal fullness and distention. Reflux. Weight loss.  EXAM: CT ABDOMEN AND PELVIS WITH CONTRAST  TECHNIQUE: Multidetector CT imaging of the abdomen and pelvis was performed using the standard protocol following bolus administration of intravenous contrast.  CONTRAST:  OMNIPAQUE IOHEXOL 240 MG/ML SOLN  COMPARISON:  No priors.  FINDINGS: Lung Bases: Pleural-based wedge-shaped mass like opacity in the periphery of the right lower lobe (image 3 of series 4). Patchy ill-defined of peribronchovascular ground-glass attenuation in the right lower lobe. Trace right pleural effusion layering dependently. Multiple small soft tissue nodules are noted in the inferior aspect of the anterior mediastinum, immediately above the diaphragm, likely to represent borderline enlarged lymph nodes. The largest of these measures only 8 mm in short axis.  Prominence low right internal mammary lymph node measuring 6 mm in short axis. Patulous distal esophagus.  Abdomen/Pelvis: 5.2 x 1.8 cm heterogeneously enhancing lesion associated with the liver capsule overlying the right lobe of the liver (image 18 of series 3). No intraparenchymal lesions identified within the liver at this time. The appearance of the gallbladder, pancreas, spleen, bilateral adrenal glands and bilateral kidneys is unremarkable.  There is an enormous mass which appears to arise from the pelvis extending cephalad into the mid to upper abdomen, measuring approximately 24.1 x 16.8 x 19.9 cm. This mass appears predominantly cystic, but has innumerable internal septations and irregular areas of enhancing soft tissue within this lesion. This mass is immediately adjacent to the fundus of the uterus, however, this does not appear to arise from the uterus, and likely arises from one or both of the ovaries. The uterus itself  is mildly heterogeneous in appearance, likely related to the presence of fibroids. There is some indistinctness in the omentum adjacent to the mass, best demonstrated on image 56 of series 3, suspicious for early omental disease. Similar findings are also present in the left lower quadrant on image 65 of series 3. Small volume of ascites, most pronounced adjacent to the liver.  No pathologic distention of small bowel. No pneumoperitoneum. Several enlarged right external iliac lymph nodes are noted, measuring up to 1.3 cm in short axis. Notably, the right common femoral vein and visualized portions of the profunda femoral and superficial femoral veins on the right side appear distended, and are filled with nonenhancing material, most compatible with a large volume of deep venous thrombosis. The right external iliac vein and remaining deep veins of the pelvis are otherwise patent bilaterally. Notably, this pelvic mass does appear to significantly compress and nearly completely occlude the  proximal inferior vena cava.  Musculoskeletal: There are no aggressive appearing lytic or blastic lesions noted in the visualized portions of the skeleton.  IMPRESSION: 1. 24.1 x 16.8 x 19.9 cm complex cystic mass which appears to arise from the pelvis extending into the mid upper abdomen is highly suspicious for an ovarian neoplasm. There is evidence of early omental disease, as well as a serosal implant upon the surface of the liver, with small volume of probable malignant ascites, and potential transdiaphragmatic extension based on small lymph nodes immediately above the diaphragm. 2. Importantly, this pelvic mass is causing severe compression of the proximal inferior vena cava, and there is a large volume of deep venous thrombosis in the visualized portion of the right thigh involving right superficial femoral, profunda femoral and common femoral veins. Additionally, there is a mass like peripheral wedge-shaped opacity in the right lower lobe. In the setting of deep venous thrombosis, this wedge-shaped opacity would be most concerning for potential pulmonary infarction. Associated with this, there is a trace right pleural effusion. Unfortunately, evaluation of the pulmonary arteries in this region is severely limited by suboptimal opacification with contrast and patient respiratory motion. Further evaluation with PE protocol CT scan is strongly recommended at this time. 3. Additional incidental findings, as above. Critical Value/emergent results were called by telephone at the time of interpretation on 02/22/2013 at 5:03 PM to Dr.[name] [facility], who verbally acknowledged these results.   Electronically Signed   By: Trudie Reed M.D.   On: 02/22/2013 17:03    EKG: Independently reviewed. Sinus tachycardia.  Assessment/Plan Principal Problem:   Abdominal mass Active Problems:   DVT (deep venous thrombosis)   Anemia   Renal failure   HTN (hypertension)   1. Abdominal mass concerning for ovarian  cancer with metastasis - I have discussed with on-call OB/GYN Dr. Emelda Fear. Dr. Emelda Fear has taken patient's details and said that he would be contacting GYN oncologist who will be seeing patient in consult at [facility]. Further recommendations per them. 2. DVT with possible PE - since patient's creatinine is elevated CT angiography chest was not done. CT abdomen the show DVT of the right lower extremity. Patient has been placed on heparin infusion for now. 3. Leukocytosis - since chest x-ray was showing possible infiltrates which could be from infarct from PE but given the leukocytosis have placed patient on empiric antibiotics for possible pneumonia since patient has already taken Levaquin I have placed patient on vancomycin and cefepime. 4. Anemia - patient states that she has chronic anemia. Check anemia panel and closely follow CBC.  5. History of hypertension - since patient has renal failure at this time I'm holding off patient's hydrochlorothiazide and ARB. Patient has been placed on when necessary IV hydralazine for systolic blood pressure more than 160. Closely follow blood pressure trends. 6. Renal failure - could be chronic. Closely follow intake output. Check a UA and urine creatinine sodium.    Code Status: Full code.  Family Communication: Patient's sister at the bedside.  Disposition Plan: Admit to inpatient.    Wyett Narine N. [facility] Pager [phone].  If 7PM-7AM, please contact night-coverage [email or web address] Password [email or web address] [facility] 02/22/2013, 9:48 PM

## 2013-02-22 NOTE — ED Notes (Signed)
Pt sent by MD after having imagine done with dx of pulmonary embolism, DVT in right leg and uterine cancer. Sent here. Pt denies chest pain. Reports recent weight loss- 25lb in last 3 weeks. No appetite.

## 2013-02-22 NOTE — Progress Notes (Signed)
ANTIBIOTIC CONSULT NOTE - INITIAL  Pharmacy Consult for Vancomycin and Cefepime Indication: rule out pneumonia  No Known Allergies  Patient Measurements: Height: 5' 6.93" (170 cm) Weight: 233 lb 8 oz (105.915 kg) IBW/kg (Calculated) : 61.44  Vital Signs: Temp: 98.9 F (37.2 C) (12/03 2318) Temp src: Oral (12/03 2318) BP: 115/57 mmHg (12/03 2318) Pulse Rate: 106 (12/03 2318) Intake/Output from previous day:   Intake/Output from this shift:    Labs:  Recent Labs  02/22/13 1837 02/22/13 1910  WBC 19.7*  --   HGB 7.5* 8.2*  PLT 402*  --   CREATININE 1.70* 2.10*   Estimated Creatinine Clearance: 40.5 ml/min (by C-G formula based on Cr of 2.1). No results found for this basename: VANCOTROUGH, Leodis Binet, VANCORANDOM, GENTTROUGH, GENTPEAK, GENTRANDOM, TOBRATROUGH, TOBRAPEAK, TOBRARND, AMIKACINPEAK, AMIKACINTROU, AMIKACIN,  in the last 72 hours   Microbiology: Recent Results (from the past 720 hour(s))  RAPID STREP SCREEN     Status: None   Collection Time    02/11/13  5:51 PM      Result Value Range Status   Streptococcus, Group A Screen (Direct) NEGATIVE  NEGATIVE Final   Comment: (NOTE)     A Rapid Antigen test may result negative if the antigen level in the     sample is below the detection level of this test. The FDA has not     cleared this test as a stand-alone test therefore the rapid antigen     negative result has reflexed to a Group A Strep culture.  CULTURE, GROUP A STREP     Status: None   Collection Time    02/11/13  5:51 PM      Result Value Range Status   Specimen Description THROAT   Final   Special Requests NONE   Final   Culture     Final   Value: No Beta Hemolytic Streptococci Isolated     Performed at [facility]   Report Status 02/14/2013 FINAL   Final   Assessment: 47 yo female with possible PNA for empiric antibiotics   Goal of Therapy:  Vancomycin trough level 15-20 mcg/ml  Plan:  Vancomycin 1500 mg IV now, then 750 mg IV  q12h Cefepime 1 g IV q12h  Eddie Candle 02/22/2013,11:57 PM

## 2013-02-23 DIAGNOSIS — E43 Unspecified severe protein-calorie malnutrition: Secondary | ICD-10-CM | POA: Insufficient documentation

## 2013-02-23 LAB — VITAMIN B12: Vitamin B-12: 467 pg/mL (ref 211–911)

## 2013-02-23 LAB — RETICULOCYTES: Retic Ct Pct: 2.2 % (ref 0.4–3.1)

## 2013-02-23 LAB — CBC
HCT: 20 % — ABNORMAL LOW (ref 36.0–46.0)
MCH: 25.9 pg — ABNORMAL LOW (ref 26.0–34.0)
MCHC: 32 g/dL (ref 30.0–36.0)
MCV: 81 fL (ref 78.0–100.0)
RDW: 17.3 % — ABNORMAL HIGH (ref 11.5–15.5)

## 2013-02-23 LAB — COMPREHENSIVE METABOLIC PANEL
ALT: <5 U/L (ref 0–35)
Albumin: 2.6 g/dL — ABNORMAL LOW (ref 3.5–5.2)
Alkaline Phosphatase: 80 U/L (ref 39–117)
CO2: 25 mEq/L (ref 19–32)
Calcium: 9 mg/dL (ref 8.4–10.5)
GFR calc Af Amer: 47 mL/min — ABNORMAL LOW (ref >90)
Glucose, Bld: 89 mg/dL (ref 70–99)
Potassium: 3.9 mEq/L (ref 3.5–5.1)
Sodium: 134 mEq/L — ABNORMAL LOW (ref 135–145)
Total Protein: 8.9 g/dL — ABNORMAL HIGH (ref 6.0–8.3)

## 2013-02-23 LAB — CREATININE, URINE, RANDOM: Creatinine, Urine: 134.3 mg/dL

## 2013-02-23 LAB — URINE MICROSCOPIC-ADD ON

## 2013-02-23 LAB — HEPARIN LEVEL (UNFRACTIONATED): Heparin Unfractionated: 0.4 IU/mL (ref 0.30–0.70)

## 2013-02-23 LAB — URINALYSIS, ROUTINE W REFLEX MICROSCOPIC
Bilirubin Urine: NEGATIVE
Glucose, UA: NEGATIVE mg/dL
Ketones, ur: NEGATIVE mg/dL
Protein, ur: NEGATIVE mg/dL

## 2013-02-23 LAB — IRON AND TIBC
Iron: 21 ug/dL — ABNORMAL LOW (ref 42–135)
Saturation Ratios: 11 % — ABNORMAL LOW (ref 20–55)
TIBC: 187 ug/dL — ABNORMAL LOW (ref 250–470)

## 2013-02-23 LAB — ABO/RH: ABO/RH(D): O POS

## 2013-02-23 LAB — PREPARE RBC (CROSSMATCH)

## 2013-02-23 LAB — FOLATE: Folate: 6.4 ng/mL

## 2013-02-23 MED ORDER — VANCOMYCIN HCL IN DEXTROSE 750-5 MG/150ML-% IV SOLN: 750.0000 mg | Freq: Two times a day (BID) | INTRAVENOUS | Status: DC

## 2013-02-23 MED ORDER — DEXTROSE 5 % IV SOLN: 1.0000 g | Freq: Two times a day (BID) | INTRAVENOUS | Status: DC

## 2013-02-23 MED ORDER — VANCOMYCIN HCL 10 G IV SOLR
1500.0000 mg | Freq: Once | INTRAVENOUS | Status: AC
  Administered 2013-02-23: 1500 mg via INTRAVENOUS
  Filled 2013-02-23: qty 1500

## 2013-02-23 MED ORDER — DEXTROSE 5 % IV SOLN
1.0000 g | Freq: Two times a day (BID) | INTRAVENOUS | Status: DC
  Administered 2013-02-23 (×2): 1 g via INTRAVENOUS
  Filled 2013-02-23 (×3): qty 1

## 2013-02-23 MED ORDER — VANCOMYCIN HCL IN DEXTROSE 750-5 MG/150ML-% IV SOLN
750.0000 mg | Freq: Two times a day (BID) | INTRAVENOUS | Status: DC
  Filled 2013-02-23 (×3): qty 150

## 2013-02-23 MED ORDER — HEPARIN BOLUS VIA INFUSION
2000.0000 [IU] | Freq: Once | INTRAVENOUS | Status: AC
  Administered 2013-02-23: 2000 [IU] via INTRAVENOUS
  Filled 2013-02-23: qty 2000

## 2013-02-23 MED ORDER — ENSURE COMPLETE PO LIQD: 237.0000 mL | Freq: Two times a day (BID) | ORAL | Status: DC

## 2013-02-23 MED ORDER — HEPARIN (PORCINE) IN NACL 100-0.45 UNIT/ML-% IJ SOLN: 1750.0000 [IU]/h | INTRAMUSCULAR | Status: DC

## 2013-02-23 NOTE — Progress Notes (Signed)
[facility]                                                                                Patient Demographics  Caitlyn Higgins, is a 47 y.o. female, DOB - 1965-10-23, KGM:[id number]  Admit date - 02/22/2013   Admitting Physician Eduard Clos, MD  Outpatient Primary MD for the patient is Johny Blamer, MD  LOS - 1   Chief Complaint  Patient presents with  . Weight Loss  . Shortness of Breath        Assessment & Plan    1. Abdominal mass concerning for ovarian cancer with metastasis - admitting physician had detailed discussion with on-call OB/GYN Dr. Emelda Fear. Dr. Emelda Fear had taken patient's details and said that he would be contacting GYN oncologist who will be seeing patient in consult at [facility]. Further recommendations per them.     2.DVT with possible PE - since patient's creatinine is elevated CT angiography chest was not done. Venous duplex of the right leg confirms extensive DVT of the right lower extremity. Patient has been placed on heparin infusion for now. She remains chest pain-free and has no shortness of breath. Once confirmed by OB that no surgical intervention is needed acutely we will transition the patient to oral xaralto.     3. Reactionary Leukocytosis (from DVT versus pneumonia) - since chest x-ray was showing possible infiltrates which could be from infarct from PE but given the leukocytosis have placed patient on empiric antibiotics for possible pneumonia since patient has already taken Levaquin we have placed patient on vancomycin and cefepime. We'll monitor clinically.      4. Anemia of chronic disease. Iron panel is inconclusive, she will get 2 units of packed RBC transfusion on 02/23/2013, will monitor H&H. No history of blood in stool or melena.      5.HTN -  Patient has been placed on when necessary IV hydralazine for systolic blood pressure more than 160. Closely follow blood pressure trends.      6. Renal failure -  could be chronic baseline creatinine in chart. Closely follow intake output.FeNA is <1, follow BMP with gentle IV fluids post transfusion. Creatinine improving.     Code Status: Full  Family Communication: Esther bedside  Disposition Plan: Home   Procedures  lower extremity venous duplex, CT abdomen   Consults  OB   Medications  Scheduled Meds: . ceFEPime (MAXIPIME) IV  1 g Intravenous Q12H  . feeding supplement (ENSURE COMPLETE)  237 mL Oral BID BM  . sodium chloride  3 mL Intravenous Q12H  . vancomycin  750 mg Intravenous Q12H   Continuous Infusions: . sodium chloride 75 mL/hr at 02/23/13 0610  . heparin 1,750 Units/hr (02/23/13 0655)   PRN Meds:.acetaminophen, acetaminophen, hydrALAZINE, ondansetron (ZOFRAN) IV, ondansetron  DVT Prophylaxis    Heparin   Lab Results  Component Value Date   PLT 382 02/23/2013    Antibiotics     Anti-infectives   Start     Dose/Rate Route Frequency Ordered Stop   02/23/13 0600  vancomycin (VANCOCIN) IVPB 750 mg/150 ml premix     750 mg 150 mL/hr over 60 Minutes Intravenous Every 12 hours  02/23/13 0000     02/23/13 0030  vancomycin (VANCOCIN) 1,500 mg in sodium chloride 0.9 % 500 mL IVPB     1,500 mg 250 mL/hr over 120 Minutes Intravenous  Once 02/23/13 0000 02/23/13 0505   02/23/13 0015  ceFEPIme (MAXIPIME) 1 g in dextrose 5 % 50 mL IVPB     1 g 100 mL/hr over 30 Minutes Intravenous Every 12 hours 02/23/13 0000            Subjective:   Matalie Compton today has, No headache, No chest pain, No abdominal pain - No Nausea, No new weakness tingling or numbness, No Cough - SOB.    Objective:   Filed Vitals:   02/23/13 0825 02/23/13 0930 02/23/13 1041 02/23/13 1120  BP: 98/62 102/68 103/71 100/68  Pulse: 102 103 95 93  Temp: 98.2 F (36.8 C) 98 F (36.7 C) 98.5 F (36.9 C) 98.3 F (36.8 C)  TempSrc: Oral Oral Oral Oral  Resp: 16 18 18 18   Height:      Weight:      SpO2: 94% 96% 95%     Wt Readings from Last 3  Encounters:  02/22/13 105.915 kg (233 lb 8 oz)  08/02/12 125.703 kg (277 lb 2 oz)     Intake/Output Summary (Last 24 hours) at 02/23/13 1344 Last data filed at 02/23/13 1120  Gross per 24 hour  Intake  697.5 ml  Output    350 ml  Net  347.5 ml    Exam Awake Alert, Oriented X 3, No new F.N deficits, Normal affect .AT,PERRAL Supple Neck,No JVD, No cervical lymphadenopathy appriciated.  Symmetrical Chest wall movement, Good air movement bilaterally, CTAB RRR,No Gallops,Rubs or new Murmurs, No Parasternal Heave +ve B.Sounds, Abd Soft, Non tender, No organomegaly appriciated, No rebound - guarding or rigidity. No Cyanosis, Clubbing or edema, No new Rash or bruise  , R Leg mildly swollen   Data Review   Micro Results No results found for this or any previous visit (from the past 240 hour(s)).  Radiology Reports Dg Chest 2 View  02/22/2013   CLINICAL DATA:  Abdominal pain  EXAM: CHEST  2 VIEW  COMPARISON:  02/11/2013  FINDINGS: Cardiomediastinal silhouette is stable. Persistent patchy infiltrate/ pneumonia right base. No pulmonary edema.  IMPRESSION: Persistent patchy infiltrate/ pneumonia right base. No pulmonary edema.   Electronically Signed   By: Natasha Mead M.D.   On: 02/22/2013 20:50   Dg Chest 2 View  02/11/2013   CLINICAL DATA:  Cough  EXAM: CHEST  2 VIEW  COMPARISON:  None.  FINDINGS: There is patchy consolidation of right lung base. Lung volumes are low. There is no pulmonary edema. The aorta is tortuous. The heart size is normal. There is scoliosis of spine.  IMPRESSION: Right lung base pneumonia.   Electronically Signed   By: Sherian Rein M.D.   On: 02/11/2013 17:59   Ct Abdomen Pelvis W Contrast  02/22/2013   CLINICAL DATA:  Abdominal fullness and distention. Reflux. Weight loss.  EXAM: CT ABDOMEN AND PELVIS WITH CONTRAST  TECHNIQUE: Multidetector CT imaging of the abdomen and pelvis was performed using the standard protocol following bolus administration of intravenous  contrast.  CONTRAST:  OMNIPAQUE IOHEXOL 240 MG/ML SOLN  COMPARISON:  No priors.  FINDINGS: Lung Bases: Pleural-based wedge-shaped mass like opacity in the periphery of the right lower lobe (image 3 of series 4). Patchy ill-defined of peribronchovascular ground-glass attenuation in the right lower lobe. Trace right pleural effusion layering  dependently. Multiple small soft tissue nodules are noted in the inferior aspect of the anterior mediastinum, immediately above the diaphragm, likely to represent borderline enlarged lymph nodes. The largest of these measures only 8 mm in short axis. Prominence low right internal mammary lymph node measuring 6 mm in short axis. Patulous distal esophagus.  Abdomen/Pelvis: 5.2 x 1.8 cm heterogeneously enhancing lesion associated with the liver capsule overlying the right lobe of the liver (image 18 of series 3). No intraparenchymal lesions identified within the liver at this time. The appearance of the gallbladder, pancreas, spleen, bilateral adrenal glands and bilateral kidneys is unremarkable.  There is an enormous mass which appears to arise from the pelvis extending cephalad into the mid to upper abdomen, measuring approximately 24.1 x 16.8 x 19.9 cm. This mass appears predominantly cystic, but has innumerable internal septations and irregular areas of enhancing soft tissue within this lesion. This mass is immediately adjacent to the fundus of the uterus, however, this does not appear to arise from the uterus, and likely arises from one or both of the ovaries. The uterus itself is mildly heterogeneous in appearance, likely related to the presence of fibroids. There is some indistinctness in the omentum adjacent to the mass, best demonstrated on image 56 of series 3, suspicious for early omental disease. Similar findings are also present in the left lower quadrant on image 65 of series 3. Small volume of ascites, most pronounced adjacent to the liver.  No pathologic  distention of small bowel. No pneumoperitoneum. Several enlarged right external iliac lymph nodes are noted, measuring up to 1.3 cm in short axis. Notably, the right common femoral vein and visualized portions of the profunda femoral and superficial femoral veins on the right side appear distended, and are filled with nonenhancing material, most compatible with a large volume of deep venous thrombosis. The right external iliac vein and remaining deep veins of the pelvis are otherwise patent bilaterally. Notably, this pelvic mass does appear to significantly compress and nearly completely occlude the proximal inferior vena cava.  Musculoskeletal: There are no aggressive appearing lytic or blastic lesions noted in the visualized portions of the skeleton.  IMPRESSION: 1. 24.1 x 16.8 x 19.9 cm complex cystic mass which appears to arise from the pelvis extending into the mid upper abdomen is highly suspicious for an ovarian neoplasm. There is evidence of early omental disease, as well as a serosal implant upon the surface of the liver, with small volume of probable malignant ascites, and potential transdiaphragmatic extension based on small lymph nodes immediately above the diaphragm. 2. Importantly, this pelvic mass is causing severe compression of the proximal inferior vena cava, and there is a large volume of deep venous thrombosis in the visualized portion of the right thigh involving right superficial femoral, profunda femoral and common femoral veins. Additionally, there is a mass like peripheral wedge-shaped opacity in the right lower lobe. In the setting of deep venous thrombosis, this wedge-shaped opacity would be most concerning for potential pulmonary infarction. Associated with this, there is a trace right pleural effusion. Unfortunately, evaluation of the pulmonary arteries in this region is severely limited by suboptimal opacification with contrast and patient respiratory motion. Further evaluation with PE  protocol CT scan is strongly recommended at this time. 3. Additional incidental findings, as above. Critical Value/emergent results were called by telephone at the time of interpretation on 02/22/2013 at 5:03 PM to Dr.[name] [facility], who verbally acknowledged these results.   Electronically Signed   By: Reuel Boom  Entrikin M.D.   On: 02/22/2013 17:03    CBC  Recent Labs Lab 02/22/13 1837 02/22/13 1910 02/23/13 0400  WBC 19.7*  --  18.9*  HGB 7.5* 8.2* 6.4*  HCT 23.6* 24.0* 20.0*  PLT 402*  --  382  MCV 83.1  --  81.0  MCH 26.4  --  25.9*  MCHC 31.8  --  32.0  RDW 17.1*  --  17.3*  LYMPHSABS 1.4  --   --   MONOABS 1.6*  --   --   EOSABS 0.2  --   --   BASOSABS 0.0  --   --     Chemistries   Recent Labs Lab 02/22/13 1837 02/22/13 1910 02/23/13 0400  NA 131* 136 134*  K 3.8 3.8 3.9  CL 92* 98 97  CO2 23  --  25  GLUCOSE 94 98 89  BUN 34* 36* 31*  CREATININE 1.70* 2.10* 1.49*  CALCIUM 9.3  --  9.0  AST 16  --  17  ALT 5  --  <5  ALKPHOS 83  --  80  BILITOT 0.5  --  0.3   ------------------------------------------------------------------------------------------------------------------ estimated creatinine clearance is 57.1 ml/min (by C-G formula based on Cr of 1.49). ------------------------------------------------------------------------------------------------------------------ No results found for this basename: HGBA1C,  in the last 72 hours ------------------------------------------------------------------------------------------------------------------ No results found for this basename: CHOL, HDL, LDLCALC, TRIG, CHOLHDL, LDLDIRECT,  in the last 72 hours ------------------------------------------------------------------------------------------------------------------ No results found for this basename: TSH, T4TOTAL, FREET3, T3FREE, THYROIDAB,  in the last 72  hours ------------------------------------------------------------------------------------------------------------------  Recent Labs  02/23/13 0400  VITAMINB12 467  FOLATE 6.4  FERRITIN 304*  TIBC 187*  IRON 21*  RETICCTPCT 2.2    Coagulation profile  Recent Labs Lab 02/22/13 1837  INR 1.03    No results found for this basename: DDIMER,  in the last 72 hours  Cardiac Enzymes No results found for this basename: CK, CKMB, TROPONINI, MYOGLOBIN,  in the last 168 hours ------------------------------------------------------------------------------------------------------------------ No components found with this basename: POCBNP,      Time Spent in minutes   35   Ester Hilley K M.D on 02/23/2013 at 1:44 PM  Between 7am to 7pm - Pager - [phone]  After 7pm go to [email or web address] - password [email or web address]  And look for the night coverage person covering for me after hours  [facility] Office  [phone]

## 2013-02-23 NOTE — Discharge Summary (Signed)
[facility]                                                                                   Kynzlee Laretta Bolster, is a 47 y.o. female  DOB 1965-10-30  MRN [id number].  Admission date:  02/22/2013  Admitting Physician  Eduard Clos, MD  Discharge Date:  02/23/2013   Primary MD  Johny Blamer, MD  Recommendations for primary care physician for things to follow:      Admission Diagnosis  Renal failure [586] Pulmonary embolism [415.19] Anemia [285.9] Ovarian mass [620.9] DVT (deep venous thrombosis), right [453.40]  Discharge Diagnosis  right leg extensive DVT, possible PE, community-acquired pneumonia failed Levaquin, ovarian mass, anemia of chronic disease  Principal Problem:   Abdominal mass Active Problems:   DVT (deep venous thrombosis)   Anemia   Renal failure   HTN (hypertension)   Protein-calorie malnutrition, severe      Past Medical History  Diagnosis Date  . Reflux   . Hypertension     History reviewed. No pertinent past surgical history.   Discharge Condition: Stable   Consults obtained - OB physician Dr. Duard Brady   Discharge Medications      Medication List    STOP taking these medications       acetaminophen 500 MG tablet  Commonly known as:  TYLENOL     ALEVE 220 MG tablet  Generic drug:  naproxen sodium     levofloxacin 750 MG tablet  Commonly known as:  LEVAQUIN     olmesartan-hydrochlorothiazide 20-12.5 MG per tablet  Commonly known as:  BENICAR HCT      TAKE these medications       benzonatate 100 MG capsule  Commonly known as:  TESSALON  Take 1 capsule (100 mg total) by mouth every 8 (eight) hours.     cetirizine 10 MG tablet  Commonly known as:  ZYRTEC  Take 10 mg by mouth every evening.     dextrose 5 % SOLN 50 mL with ceFEPIme 1 G SOLR 1 g  Inject 1 g into the vein every 12 (twelve) hours.     esomeprazole 40 MG capsule  Commonly known as:  NEXIUM  Take 40 mg by mouth daily before breakfast.     heparin 100-0.45 UNIT/ML-% infusion  Inject 1,750 Units/hr into the vein continuous.     lidocaine 5 %  Commonly known as:  LIDODERM  Place 1 patch onto the skin daily as needed (pain). Remove & Discard patch within 12 hours or as directed by MD     Vancomycin 750 MG/150ML Soln  Commonly known as:  VANCOCIN  Inject 150 mLs (750 mg total) into the vein every 12 (twelve) hours.         Diet and Activity recommendation: See Discharge Instructions below     Major procedures and Radiology Reports - PLEASE review detailed and final reports for all details, in brief -    Leg Venous Duplex  Right: DVT noted in the CFV, PFV, distal FV, popliteal v, gastrocnemius veins. Left: DVT noted in the distal FV, popliteal v,PTV, and peroneal veins.    Dg Chest 2 View  02/22/2013  CLINICAL DATA:  Abdominal pain  EXAM: CHEST  2 VIEW  COMPARISON:  02/11/2013  FINDINGS: Cardiomediastinal silhouette is stable. Persistent patchy infiltrate/ pneumonia right base. No pulmonary edema.  IMPRESSION: Persistent patchy infiltrate/ pneumonia right base. No pulmonary edema.   Electronically Signed   By: Natasha Mead M.D.   On: 02/22/2013 20:50   Dg Chest 2 View  02/11/2013   CLINICAL DATA:  Cough  EXAM: CHEST  2 VIEW  COMPARISON:  None.  FINDINGS: There is patchy consolidation of right lung base. Lung volumes are low. There is no pulmonary edema. The aorta is tortuous. The heart size is normal. There is scoliosis of spine.  IMPRESSION: Right lung base pneumonia.   Electronically Signed   By: Sherian Rein M.D.   On: 02/11/2013 17:59   Ct Abdomen Pelvis W Contrast  02/22/2013   CLINICAL DATA:  Abdominal fullness and distention. Reflux. Weight loss.  EXAM: CT ABDOMEN AND PELVIS WITH CONTRAST  TECHNIQUE: Multidetector CT imaging of the abdomen and pelvis was performed using the standard protocol following bolus administration of intravenous contrast.  CONTRAST:  OMNIPAQUE IOHEXOL 240 MG/ML SOLN  COMPARISON:  No  priors.  FINDINGS: Lung Bases: Pleural-based wedge-shaped mass like opacity in the periphery of the right lower lobe (image 3 of series 4). Patchy ill-defined of peribronchovascular ground-glass attenuation in the right lower lobe. Trace right pleural effusion layering dependently. Multiple small soft tissue nodules are noted in the inferior aspect of the anterior mediastinum, immediately above the diaphragm, likely to represent borderline enlarged lymph nodes. The largest of these measures only 8 mm in short axis. Prominence low right internal mammary lymph node measuring 6 mm in short axis. Patulous distal esophagus.  Abdomen/Pelvis: 5.2 x 1.8 cm heterogeneously enhancing lesion associated with the liver capsule overlying the right lobe of the liver (image 18 of series 3). No intraparenchymal lesions identified within the liver at this time. The appearance of the gallbladder, pancreas, spleen, bilateral adrenal glands and bilateral kidneys is unremarkable.  There is an enormous mass which appears to arise from the pelvis extending cephalad into the mid to upper abdomen, measuring approximately 24.1 x 16.8 x 19.9 cm. This mass appears predominantly cystic, but has innumerable internal septations and irregular areas of enhancing soft tissue within this lesion. This mass is immediately adjacent to the fundus of the uterus, however, this does not appear to arise from the uterus, and likely arises from one or both of the ovaries. The uterus itself is mildly heterogeneous in appearance, likely related to the presence of fibroids. There is some indistinctness in the omentum adjacent to the mass, best demonstrated on image 56 of series 3, suspicious for early omental disease. Similar findings are also present in the left lower quadrant on image 65 of series 3. Small volume of ascites, most pronounced adjacent to the liver.  No pathologic distention of small bowel. No pneumoperitoneum. Several enlarged right external iliac  lymph nodes are noted, measuring up to 1.3 cm in short axis. Notably, the right common femoral vein and visualized portions of the profunda femoral and superficial femoral veins on the right side appear distended, and are filled with nonenhancing material, most compatible with a large volume of deep venous thrombosis. The right external iliac vein and remaining deep veins of the pelvis are otherwise patent bilaterally. Notably, this pelvic mass does appear to significantly compress and nearly completely occlude the proximal inferior vena cava.  Musculoskeletal: There are no aggressive appearing lytic or blastic  lesions noted in the visualized portions of the skeleton.  IMPRESSION: 1. 24.1 x 16.8 x 19.9 cm complex cystic mass which appears to arise from the pelvis extending into the mid upper abdomen is highly suspicious for an ovarian neoplasm. There is evidence of early omental disease, as well as a serosal implant upon the surface of the liver, with small volume of probable malignant ascites, and potential transdiaphragmatic extension based on small lymph nodes immediately above the diaphragm. 2. Importantly, this pelvic mass is causing severe compression of the proximal inferior vena cava, and there is a large volume of deep venous thrombosis in the visualized portion of the right thigh involving right superficial femoral, profunda femoral and common femoral veins. Additionally, there is a mass like peripheral wedge-shaped opacity in the right lower lobe. In the setting of deep venous thrombosis, this wedge-shaped opacity would be most concerning for potential pulmonary infarction. Associated with this, there is a trace right pleural effusion. Unfortunately, evaluation of the pulmonary arteries in this region is severely limited by suboptimal opacification with contrast and patient respiratory motion. Further evaluation with PE protocol CT scan is strongly recommended at this time. 3. Additional incidental  findings, as above. Critical Value/emergent results were called by telephone at the time of interpretation on 02/22/2013 at 5:03 PM to Dr.[name], who verbally acknowledged these results.   Electronically Signed   By: Trudie Reed M.D.   On: 02/22/2013 17:03    Micro Results      No results found for this or any previous visit (from the past 240 hour(s)).   History of present illness and  Hospital Course:     Kindly see H&P for history of present illness and admission details, please review complete Labs, Consult reports and Test reports for all details in brief Caitlyn Higgins, is a 47 y.o. female, patient with history of   anemia of chronic disease, hypertension was experiencing abdominal bloating for the last several weeks and saw a local GI physician for the workup of the same. She underwent CT scan of the abdomen pelvis which revealed a large ovarian mass which is for ovarian cancer. Also during her workup she was found to be profoundly anemic with iron studies being inconclusive and she was found to have an acute right leg DVT which was extensive.   Patient was admitted to our hospital she received 2 units of packed RBC, she does not have any history of red blood per stool or melena, her iron studies were inconclusive, this most likely is anemia of chronic disease in the setting of ovarian malignancy. Also for her acute right leg DVT she has been started on heparin drip.   For ovarian mass we consulted OB service, case was discussed in detail with Duard Brady suggested that patient should be transferred to [facility] in the service for surgical intervention, she suggested that they might have to hold heparin drip and place an IVC filter. This was discussed in extent to the patient was agreeable for transfer.   Note patient was also found to be in renal failure, no baseline creatinine was available in the system, when she was admitted her creatinine was 1.79, her fractional  excretion of sodium was less than 1 I think she had prerenal insult due to her severe anemia and also she was taking NSAIDs along with him addition of ARB and HCTZ, offending medications were held with gentle transfusion of one unit of RBC and second unit do her creatinine  is already improving.   Note patient had leukocytosis and infiltrate on admission, she was treated for community-acquired pneumonia with Levaquin in the outpatient setting, it was presumed that she failed Levaquin and she was empirically placed on combination of cephalosporin and vancomycin. Close clinical monitoring and repeat chest x-ray is recommended.      Today   Subjective:   Devani Higgins today has no headache,no chest abdominal pain,no new weakness tingling or numbness, feels much better .  Objective:   Blood pressure 100/68, pulse 93, temperature 98.3 F (36.8 C), temperature source Oral, resp. rate 18, height 5' 6.93" (1.7 m), weight 105.915 kg (233 lb 8 oz), last menstrual period 02/21/2013, SpO2 95.00%.   Intake/Output Summary (Last 24 hours) at 02/23/13 1408 Last data filed at 02/23/13 1120  Gross per 24 hour  Intake  697.5 ml  Output    350 ml  Net  347.5 ml    Exam Awake Alert, Oriented *3, No new F.N deficits, Normal affect [place].AT,PERRAL Supple Neck,No JVD, No cervical lymphadenopathy appriciated.  Symmetrical Chest wall movement, Good air movement bilaterally, CTAB RRR,No Gallops,Rubs or new Murmurs, No Parasternal Heave +ve B.Sounds, Abd Soft, Non tender, No organomegaly appriciated, No rebound -guarding or rigidity. No Cyanosis, Clubbing or edema, No new Rash or bruise, mild L Leg swelling    Data Review   CBC w Diff: Lab Results  Component Value Date   WBC 18.9* 02/23/2013   HGB 6.4* 02/23/2013   HCT 20.0* 02/23/2013   PLT 382 02/23/2013   LYMPHOPCT 7* 02/22/2013   MONOPCT 8 02/22/2013   EOSPCT 1 02/22/2013   BASOPCT 0 02/22/2013    CMP: Lab Results  Component Value Date   NA  134* 02/23/2013   K 3.9 02/23/2013   CL 97 02/23/2013   CO2 25 02/23/2013   BUN 31* 02/23/2013   CREATININE 1.49* 02/23/2013   PROT 8.9* 02/23/2013   ALBUMIN 2.6* 02/23/2013   BILITOT 0.3 02/23/2013   ALKPHOS 80 02/23/2013   AST 17 02/23/2013   ALT <5 02/23/2013  .   Total Time in preparing paper work, data evaluation and todays exam - 35 minutes  Leroy Sea M.D on 02/23/2013 at 2:08 PM  [facility] Office  [phone]

## 2013-02-23 NOTE — Progress Notes (Signed)
ANTICOAGULATION CONSULT NOTE  Pharmacy Consult for Heparin Indication: pulmonary embolus and DVT  No Known Allergies  Patient Measurements: Height: 5' 6.93" (170 cm) Weight: 233 lb 8 oz (105.915 kg) IBW/kg (Calculated) : 61.44 Heparin Dosing Weight: 85 kg  Vital Signs: Temp: 99.1 F (37.3 C) (12/04 0503) Temp src: Oral (12/04 0503) BP: 96/61 mmHg (12/04 0503) Pulse Rate: 97 (12/04 0503)  Labs:  Recent Labs  02/22/13 1837 02/22/13 1910 02/23/13 0400  HGB 7.5* 8.2* 6.4*  HCT 23.6* 24.0* 20.0*  PLT 402*  --  382  APTT 32  --   --   LABPROT 13.3  --   --   INR 1.03  --   --   HEPARINUNFRC  --   --  0.16*  CREATININE 1.70* 2.10* 1.49*    Estimated Creatinine Clearance: 57.1 ml/min (by C-G formula based on Cr of 1.49).  Assessment: 47 year old female with large abdominal mass, new PE/DVT, for heparin  Goal of Therapy:  Heparin level 0.3-0.7 units/ml Monitor platelets by anticoagulation protocol: Yes   Plan:  Heparin 2000 units IV bolus, then increase heparin 1750 units/hr Check heparin level in 6 hours.  Geannie Risen, PharmD, BCPS  02/23/2013, 6:15 AM

## 2013-02-23 NOTE — Progress Notes (Signed)
Received referral for spiritual care for [name] as she had just found out she has cancer. Consulted with RN and learned that pt was here for blood clot and was diagnosed with ovarian cancer. RN also said pt has very supportive family. Chaplain offered emotional and spiritual support, reflective listening, and caring presence to patient and her family. Pt is preparing for transit to [facility] for surgery tonight or tomorrow. Family was very supportive. Chaplain prayed with patient and her family. They were very grateful for chaplain support.   Maurene Capes, [place] [phone]

## 2013-02-23 NOTE — Progress Notes (Signed)
ANTICOAGULATION CONSULT NOTE - Follow Up Consult  Pharmacy Consult for Heparin Indication: pulmonary embolus and DVT  No Known Allergies  Patient Measurements: Height: 5' 6.93" (170 cm) Weight: 233 lb 8 oz (105.915 kg) IBW/kg (Calculated) : 61.44 Heparin Dosing Weight: 85 kg  Vital Signs: Temp: 98.3 F (36.8 C) (12/04 1120) Temp src: Oral (12/04 1120) BP: 100/68 mmHg (12/04 1120) Pulse Rate: 93 (12/04 1120)  Labs:  Recent Labs  02/22/13 1837 02/22/13 1910 02/23/13 0400 02/23/13 1142  HGB 7.5* 8.2* 6.4*  --   HCT 23.6* 24.0* 20.0*  --   PLT 402*  --  382  --   APTT 32  --   --   --   LABPROT 13.3  --   --   --   INR 1.03  --   --   --   HEPARINUNFRC  --   --  0.16* 0.40  CREATININE 1.70* 2.10* 1.49*  --     Estimated Creatinine Clearance: 57.1 ml/min (by C-G formula based on Cr of 1.49).  Assessment: 48 year old female admitted with a new diagnosis of ovarian tumor, right leg DVT, and pulmonary embolus. She continue on IV heparin for anticoagulation. Heparin level is now therapeutic at 0.4 after dose adjustment this AM. Pt was transfused for low hemoglobin. No overt bleeding noted.   Goal of Therapy:  Heparin level 0.3-0.7 units/ml Monitor platelets by anticoagulation protocol: Yes   Plan:  1. Continue heparin gtt at 1750 units/hr 2. F/u AM heparin level and CBC to confirm dosing 3. F/u initiation of oral [facility]  Lysle Pearl, PharmD, BCPS Pager # [phone] 02/23/2013 1:06 PM

## 2013-02-23 NOTE — Progress Notes (Signed)
Called report to RN Jasmine December at [facility].  Picked up CD copy of CT scan and placed with paperwork for [facility].  Spoke with patient and family to update on status of transport.  Pt receiving second unit of PRBC at this time and will transport while transfusing. Pt resting with call bell within reach.  Will continue to monitor. Thomas Hoff

## 2013-02-23 NOTE — Progress Notes (Signed)
Utilization review completed. Domenique Quest, RN, BSN. 

## 2013-02-23 NOTE — Progress Notes (Addendum)
CRITICAL VALUE ALERT  Critical value received:  HGB:6.4  Date of notification:  02/23/2013  Time of notification:  0450  Critical value read back:yes  Nurse who received alert:  Chesley Mires   MD notified (1st page): Kirtland Bouchard Schorr   Time of first page:  [phone]

## 2013-02-23 NOTE — Progress Notes (Signed)
INITIAL NUTRITION ASSESSMENT  DOCUMENTATION CODES Per approved criteria  -Severe malnutrition in the context of chronic illness -Obesity Unspecified   INTERVENTION:  Ensure Complete twice daily (350 kcals, 13 gm protein per 8 fl oz bottle) RD to follow for nutrition care plan  NUTRITION DIAGNOSIS: Increased nutrient needs related to catabolic illness as evidenced by estimated nutrition needs  Goal: Pt to meet >/= 90% of their estimated nutrition needs   Monitor:  PO & supplemental intake, weight, labs, I/O's  Reason for Assessment: Malnutrition Screening Tool Report  47 y.o. female  Admitting Dx: Abdominal mass  ASSESSMENT: Patient with PMH of HTN; presented to GI office for abdominal bloating and uneasiness; CAT scan showed large pelvic mass concerning for ovarian cancer with metastasis.   Patient reports a decreased appetite; states she's been eating smaller portion of her meals; also endorses weight loss of 25-30 lbs; per weight records, patient has lost 44 lbs since May 2014 (16%) -- severe for time frame; PO intake 50% per flowsheet records; amenable to chocolate Ensure Complete supplements -- RD to order.  Patient meets criteria for severe malnutrition in the context of chronic illness as evidenced by < 75% intake of estimated energy requirement for > 1 month and 16% weight loss x 6 months.  Height: Ht Readings from Last 1 Encounters:  02/22/13 5' 6.93" (1.7 m)    Weight: Wt Readings from Last 1 Encounters:  02/22/13 233 lb 8 oz (105.915 kg)    Ideal Body Weight: 135 lb  % Ideal Body Weight: 172%  Wt Readings from Last 10 Encounters:  02/22/13 233 lb 8 oz (105.915 kg)  08/02/12 277 lb 2 oz (125.703 kg)    Usual Body Weight: 277 lb  % Usual Body Weight: 85%  BMI:  Body mass index is 36.65 kg/(m^2).  Estimated Nutritional Needs: Kcal: 2000-2200 Protein: 100-110 gm Fluid: 2.0-2.2 L  Skin: Intact  Diet Order: Cardiac  EDUCATION NEEDS: -No  education needs identified at this time   Intake/Output Summary (Last 24 hours) at 02/23/13 1309 Last data filed at 02/23/13 1120  Gross per 24 hour  Intake  697.5 ml  Output    350 ml  Net  347.5 ml    Labs:   Recent Labs Lab 02/22/13 1837 02/22/13 1910 02/23/13 0400  NA 131* 136 134*  K 3.8 3.8 3.9  CL 92* 98 97  CO2 23  --  25  BUN 34* 36* 31*  CREATININE 1.70* 2.10* 1.49*  CALCIUM 9.3  --  9.0  GLUCOSE 94 98 89    Scheduled Meds: . ceFEPime (MAXIPIME) IV  1 g Intravenous Q12H  . sodium chloride  3 mL Intravenous Q12H  . vancomycin  750 mg Intravenous Q12H    Continuous Infusions: . sodium chloride 75 mL/hr at 02/23/13 0610  . heparin 1,750 Units/hr (02/23/13 1610)    Past Medical History  Diagnosis Date  . Reflux   . Hypertension     History reviewed. No pertinent past surgical history.  Maureen Chatters, RD, LDN Pager #: [phone] After-Hours Pager #: [phone]

## 2013-02-23 NOTE — Progress Notes (Signed)
VASCULAR LAB PRELIMINARY  PRELIMINARY  PRELIMINARY  PRELIMINARY  Bilateral lower extremity venous duplex  completed.    Preliminary report:  Right:  DVT noted in the CFV, PFV, distal FV, popliteal v, gastrocnemius veins.  Left: DVT noted in the distal FV, popliteal v,PTV, and peroneal veins.    Reet Scharrer, RVT 02/23/2013, 11:20 AM

## 2013-02-23 NOTE — Care Management Note (Signed)
    Page 1 of 1   02/23/2013     4:41:57 PM   CARE MANAGEMENT NOTE 02/23/2013  Patient:  Caitlyn Higgins   Account Number:  [id number]  Date Initiated:  02/23/2013  Documentation initiated by:  Hilde Churchman  Subjective/Objective Assessment:   PT ADM ON 02/22/13 WITH ANEMIA, NEW DX OVARIAN CA, DVT. PTA, PT INDEPENDENT, LIVES WITH HUSBAND.     Action/Plan:   PT TO TRANSFER TO [facility] THIS EVENING; NEEDS URGENT SURGICAL INTERVENTION.   Anticipated DC Date:  02/23/2013   Anticipated DC Plan:  ACUTE TO ACUTE TRANS      DC Planning Services  CM consult      Choice offered to / List presented to:             Status of service:  Completed, signed off Medicare Important Message given?   (If response is "NO", the following Medicare IM given date fields will be blank) Date Medicare IM given:   Date Additional Medicare IM given:    Discharge Disposition:  ACUTE TO ACUTE TRANS  Per UR Regulation:  Reviewed for med. necessity/level of care/duration of stay  If discussed at Long Length of Stay Meetings, dates discussed:    Comments:

## 2013-02-24 LAB — TYPE AND SCREEN
ABO/RH(D): O POS
Antibody Screen: NEGATIVE
Unit division: 0
Unit division: 0

## 2013-03-07 ENCOUNTER — Ambulatory Visit: Payer: BC Managed Care – PPO

## 2013-03-07 ENCOUNTER — Encounter: Payer: Self-pay | Admitting: Gynecologic Oncology

## 2013-03-07 ENCOUNTER — Telehealth: Payer: Self-pay | Admitting: Oncology

## 2013-03-07 ENCOUNTER — Telehealth: Payer: Self-pay | Admitting: Gynecologic Oncology

## 2013-03-07 ENCOUNTER — Ambulatory Visit: Payer: BC Managed Care – PPO | Attending: Gynecologic Oncology | Admitting: Gynecologic Oncology

## 2013-03-07 ENCOUNTER — Encounter (INDEPENDENT_AMBULATORY_CARE_PROVIDER_SITE_OTHER): Payer: Self-pay

## 2013-03-07 VITALS — BP 145/87 | HR 105 | Temp 98.1°F | Resp 16 | Ht 67.91 in | Wt 234.4 lb

## 2013-03-07 DIAGNOSIS — Z86711 Personal history of pulmonary embolism: Secondary | ICD-10-CM | POA: Insufficient documentation

## 2013-03-07 DIAGNOSIS — R05 Cough: Secondary | ICD-10-CM | POA: Insufficient documentation

## 2013-03-07 DIAGNOSIS — R1900 Intra-abdominal and pelvic swelling, mass and lump, unspecified site: Secondary | ICD-10-CM

## 2013-03-07 DIAGNOSIS — I10 Essential (primary) hypertension: Secondary | ICD-10-CM | POA: Insufficient documentation

## 2013-03-07 DIAGNOSIS — I82409 Acute embolism and thrombosis of unspecified deep veins of unspecified lower extremity: Secondary | ICD-10-CM | POA: Insufficient documentation

## 2013-03-07 DIAGNOSIS — K219 Gastro-esophageal reflux disease without esophagitis: Secondary | ICD-10-CM | POA: Insufficient documentation

## 2013-03-07 DIAGNOSIS — C7982 Secondary malignant neoplasm of genital organs: Secondary | ICD-10-CM | POA: Insufficient documentation

## 2013-03-07 DIAGNOSIS — Z6835 Body mass index (BMI) 35.0-35.9, adult: Secondary | ICD-10-CM | POA: Insufficient documentation

## 2013-03-07 DIAGNOSIS — R5381 Other malaise: Secondary | ICD-10-CM | POA: Insufficient documentation

## 2013-03-07 DIAGNOSIS — N939 Abnormal uterine and vaginal bleeding, unspecified: Secondary | ICD-10-CM | POA: Insufficient documentation

## 2013-03-07 DIAGNOSIS — C549 Malignant neoplasm of corpus uteri, unspecified: Secondary | ICD-10-CM | POA: Insufficient documentation

## 2013-03-07 DIAGNOSIS — I871 Compression of vein: Secondary | ICD-10-CM | POA: Insufficient documentation

## 2013-03-07 DIAGNOSIS — R059 Cough, unspecified: Secondary | ICD-10-CM | POA: Insufficient documentation

## 2013-03-07 DIAGNOSIS — R609 Edema, unspecified: Secondary | ICD-10-CM | POA: Insufficient documentation

## 2013-03-07 DIAGNOSIS — N926 Irregular menstruation, unspecified: Secondary | ICD-10-CM | POA: Insufficient documentation

## 2013-03-07 DIAGNOSIS — R634 Abnormal weight loss: Secondary | ICD-10-CM | POA: Insufficient documentation

## 2013-03-07 LAB — CBC WITH DIFFERENTIAL/PLATELET
BASO%: 0.7 % (ref 0.0–2.0)
Basophils Absolute: 0.1 10*3/uL (ref 0.0–0.1)
EOS%: 4.5 % (ref 0.0–7.0)
HCT: 29 % — ABNORMAL LOW (ref 34.8–46.6)
LYMPH%: 12.5 % — ABNORMAL LOW (ref 14.0–49.7)
MCH: 27.5 pg (ref 25.1–34.0)
MCHC: 32.3 g/dL (ref 31.5–36.0)
MCV: 85.1 fL (ref 79.5–101.0)
MONO%: 8.1 % (ref 0.0–14.0)
NEUT%: 74.2 % (ref 38.4–76.8)
Platelets: 224 10*3/uL (ref 145–400)
RBC: 3.41 10*6/uL — ABNORMAL LOW (ref 3.70–5.45)
WBC: 8.5 10*3/uL (ref 3.9–10.3)

## 2013-03-07 LAB — COMPREHENSIVE METABOLIC PANEL (CC13)
Alkaline Phosphatase: 73 U/L (ref 40–150)
Anion Gap: 13 mEq/L — ABNORMAL HIGH (ref 3–11)
BUN: 13.6 mg/dL (ref 7.0–26.0)
Chloride: 99 mEq/L (ref 98–109)
Glucose: 94 mg/dl (ref 70–140)
Total Bilirubin: 0.51 mg/dL (ref 0.20–1.20)

## 2013-03-07 LAB — PROTIME-INR: Protime: 13.2 Seconds (ref 10.6–13.4)

## 2013-03-07 NOTE — Patient Instructions (Signed)
We will contact you with the results of your biopsy when available and with the results of your lab work.  Plan to see Dr. Jama Flavors, Medical Oncologist, on Dec 23.  Please call for any questions or concerns.

## 2013-03-07 NOTE — Telephone Encounter (Signed)
C/D 03/07/13 for appt. 03/14/13 °

## 2013-03-07 NOTE — Telephone Encounter (Signed)
Patient informed of lab results.  Results reviewed by Dr. Nelly Rout.  No new orders taken.  Instructed to call for any needs or concerns.

## 2013-03-07 NOTE — Progress Notes (Addendum)
Office Visit:  GYN ONCOLOGY  Chief Complaint: Pelvic mass, pulmonary embolism, vaginal bleeding  Assessment/Plan  [name] is a 47 y.o. with new diagnosis of pelvic mass, likely metastatic uterine cancer for evaluation with with large burden RLE DVT, PE with lung necrosis, S/P IVC filter,  - CT 02/22/13: 24cm pelvic mass concerning for ovarian ca, liver capsular implant, omental dz, pelvic and para-aortic lymphadenopathy. Mass compresses proximal IVC. Large volume RLE DVT. ?RLL infarct, suspicious for PE.   Biopsies of the pelvic and mesenteric mass were attempted however malignancy could not be confirmed. Today cervical and endometrial biopsies were collected. I am very optimistic that a histologic diagnosis can be made from the tissue obtained.    Given the evidence of metastatic disease the large volume DVT and a recent pulmonary embolism it would be prudent to treat this patient with neoadjuvant chemotherapy and then consider interval debulking.  The specimens collected today were sent as a rush. I've scheduled the patient to see Dr. Darrold Span next Tuesday to facilitate initiation of chemotherapy as soon as possible. I suspect that this is of uterine origin with ovarian and abdominal carcinomatosis. Taxane and platin agents would probably be most prudent.  Significant amount of vaginal bleeding was appreciated as such a CBC was obtained today in addition to chemotherapy labs. If the hematocrit is less than 25% will consider transfusion.   History of Present Illness  Caitlyn Higgins is a 47 y.o. female with new diagnosis of pelvic mass, likely metastatic ovarian/uterine  cancer transfered from [facility] to [facility] 02/23/2013.   The patient feels that she was in her usual state of health until a few days ago when she noted abdominal distention and bloating. She presented to her gastroenterologist to send her for CT scan. The CT 02/22/2013 1. 24.1 x 16.8 x 19.9 cm complex cystic mass which  appears to arise from the pelvis extending into the mid upper abdomen is highly suspicious for an ovarian neoplasm. There is evidence of early omental disease, as well as a serosal implant upon the surface of the liver, with small volume of probable malignant ascites, and potential transdiaphragmatic extension based on small lymph nodes immediately above the diaphragm.  2. Importantly, this pelvic mass is causing severe compression of the proximal inferior vena cava, and there is a large volume of deep venous thrombosis in the visualized portion of the right thigh involving right superficial femoral, profunda femoral and common femoral veins. Additionally, there is a mass like peripheral wedge-shaped opacity in the right lower lobe. In the setting of deep venous thrombosis, this wedge-shaped opacity would be most concerning for potential pulmonary infarction. Associated with this, there is a trace right pleural effusion. 3. 5.2 x 1.8 cm heterogeneously enhancing lesion  associated with the liver capsule overlying the right lobe of the  liver   IVC filter was placed due to started on Lovenox.   A biopsy of the pelvic mass and the mesenteric lesion was collected on 02/24/2013.  Pathology A. Pelvic mass, core biopsy and touch prep:  - Spindle cell proliferation with dense fibrosis and focal mixed, predominantly chronic, inflammation, favor reactive/inflammatory process (see comment)  B. Mesenteric lesion, core biopsy and touch prep:  - Fibroadipose tissue with polytypic plasma cell infiltrate (see comment)   Specimen Adequacy A. Pelvic mass, core biopsy and touch prep:  Satisfactory for evaluation.  B. Mesenteric lesion, core biopsy and touch prep:  Satisfactory for evaluation.  comment: Part A: Core biopsies show a bland  spindle cell proliferation with variable cellularity and focal mixed, predominantly chronic inflammatory infiltrate. No mitotic figures are identified. Immunohistochemical stains  were performed and the more cellular spindled areas are positive for smooth muscle actin and CD99, and negative for cytokeratin AE1/AE3, desmin, calponin, c-kit, S-100, and CD34.  (Desmin and calponin show patchy positivity, but are negative within the more cellular areas.) Beta-catenin shows cytoplasmic, but no nuclear, staining.  Focally, the tissue is lined by mesothelial cells which are positive for cytokeratin AE1/AE3. Findings are not diagnostic of a specific spindle cell neoplasm and most likely represent a reactive/inflammatory process. Given the clinical history, it is not clear whether the core biopsy material is representative of the pelvic mass, and it may represent an inflammatory reaction to the primary process.  Part B: This part consists of a few tiny fragments of fibroadipose tissue with dense fibrosis and a plasma cell infiltrate. Immunohistochemical stains were performed and CD138 along with kappa and lambda in situ hybridization support a polytypic population of plasma cells. IgG is positive in the plasma cells and IgG4 stains only rare plasma cells, arguing against an IgG4 associated sclerosing process.   [name] reports that she's had vaginal bleeding for the last 3 weeks associated with menses. She reports a 25-30 pound weight loss in the last 2 months. She states that her last Pap was approximately 4 years ago.  Allergies  Review of patient's allergies indicates no known allergies.  Past Medical History  Diagnosis Date  . Reflux   . Hypertension    History reviewed. No pertinent past surgical history.   GYN History  Hx of Abnormal Pap Smears: no, last pap ~2010  Hx of STIs: no  Never had regular periods.    Family History  family history is not on file.   Review of Systems  A 10 point review of systems is notable for a 25-30 pound weight loss in 2 months. Cough but no shortness of breath, no hemoptysis, reports intermittent nausea she denies vomiting diarrhea  or constipation. Reports fatigue. Reports irregular menses and continuous bleeding over the last 3 weeks  Physical Exam  BP 145/87  Pulse 105  Temp(Src) 98.1 F (36.7 C) (Oral)  Resp 16  Ht 5' 7.91" (1.725 m)  Wt 234 lb 6.4 oz (106.323 kg)  BMI 35.73 kg/m2  LMP 03/07/2013 General: No acute distress.  HEENT: Extra occular muscles grossly intact.  Pulmonary: CTAB. Normal work of breathing.  Cardiovascular: RRR. 3/6 systolic murmur.  Abdomen: Firm, moderately distended, non-tender. Pelvic and abdominal mass is palpable No rebound/guarding.  Musculoskeletal: Grossly normal ROM.  Extremities: Warm and well-perfused. 2+ pitting edema RLE to thigh. Trace edema LLE.  Neurological: Alert and oriented x3.  Psychological: Appropriate mood/affect.  Genitourinary: Normal external genitalia Bartholin's urethra and Skene's. Blood is noted within the vaginal vault. The cervix is approximately 3 cm and there is tumor noted within the external os and endocervical canal. I Pipelle was inserted and advanced approximately 7 cm.  Copious amount of tissue was collected for biopsy.  A large multinodular fixed mass is noted within the pelvis and cul-de-sac. Rectal: Good tone and pelvic masses palpable no intraluminal masses.

## 2013-03-08 ENCOUNTER — Telehealth: Payer: Self-pay | Admitting: Oncology

## 2013-03-08 NOTE — Telephone Encounter (Signed)
S/W PT AND GVE NEW D/T PER MD 12/18 @ 9 PT CONFIRMED

## 2013-03-09 ENCOUNTER — Telehealth: Payer: Self-pay | Admitting: *Deleted

## 2013-03-09 ENCOUNTER — Ambulatory Visit (HOSPITAL_BASED_OUTPATIENT_CLINIC_OR_DEPARTMENT_OTHER): Payer: BC Managed Care – PPO | Admitting: Lab

## 2013-03-09 ENCOUNTER — Other Ambulatory Visit: Payer: Self-pay | Admitting: Oncology

## 2013-03-09 ENCOUNTER — Encounter: Payer: Self-pay | Admitting: Oncology

## 2013-03-09 ENCOUNTER — Ambulatory Visit: Payer: BC Managed Care – PPO

## 2013-03-09 ENCOUNTER — Ambulatory Visit (HOSPITAL_BASED_OUTPATIENT_CLINIC_OR_DEPARTMENT_OTHER): Payer: BC Managed Care – PPO | Admitting: Oncology

## 2013-03-09 VITALS — BP 105/71 | HR 92 | Temp 98.4°F | Resp 20 | Ht 67.91 in | Wt 231.3 lb

## 2013-03-09 DIAGNOSIS — N898 Other specified noninflammatory disorders of vagina: Secondary | ICD-10-CM

## 2013-03-09 DIAGNOSIS — C541 Malignant neoplasm of endometrium: Secondary | ICD-10-CM

## 2013-03-09 DIAGNOSIS — C50919 Malignant neoplasm of unspecified site of unspecified female breast: Secondary | ICD-10-CM

## 2013-03-09 DIAGNOSIS — D500 Iron deficiency anemia secondary to blood loss (chronic): Secondary | ICD-10-CM

## 2013-03-09 DIAGNOSIS — C549 Malignant neoplasm of corpus uteri, unspecified: Secondary | ICD-10-CM

## 2013-03-09 DIAGNOSIS — N289 Disorder of kidney and ureter, unspecified: Secondary | ICD-10-CM

## 2013-03-09 DIAGNOSIS — C801 Malignant (primary) neoplasm, unspecified: Secondary | ICD-10-CM

## 2013-03-09 DIAGNOSIS — D508 Other iron deficiency anemias: Secondary | ICD-10-CM

## 2013-03-09 DIAGNOSIS — Z86718 Personal history of other venous thrombosis and embolism: Secondary | ICD-10-CM

## 2013-03-09 LAB — CBC WITH DIFFERENTIAL/PLATELET
Eosinophils Absolute: 0.6 10*3/uL — ABNORMAL HIGH (ref 0.0–0.5)
HCT: 27.6 % — ABNORMAL LOW (ref 34.8–46.6)
LYMPH%: 15.3 % (ref 14.0–49.7)
MONO#: 0.8 10*3/uL (ref 0.1–0.9)
MONO%: 10 % (ref 0.0–14.0)
NEUT#: 5.4 10*3/uL (ref 1.5–6.5)
NEUT%: 66.9 % (ref 38.4–76.8)
Platelets: 271 10*3/uL (ref 145–400)
RBC: 3.26 10*6/uL — ABNORMAL LOW (ref 3.70–5.45)
WBC: 8 10*3/uL (ref 3.9–10.3)
lymph#: 1.2 10*3/uL (ref 0.9–3.3)

## 2013-03-09 LAB — FERRITIN CHCC: Ferritin: 431 ng/ml — ABNORMAL HIGH (ref 9–269)

## 2013-03-09 LAB — CA 125: CA 125: 477.3 U/mL — ABNORMAL HIGH (ref 0.0–30.2)

## 2013-03-09 LAB — BASIC METABOLIC PANEL (CC13)
CO2: 26 mEq/L (ref 22–29)
Calcium: 9.6 mg/dL (ref 8.4–10.4)
Chloride: 103 mEq/L (ref 98–109)
Creatinine: 1.3 mg/dL — ABNORMAL HIGH (ref 0.6–1.1)
Glucose: 94 mg/dl (ref 70–140)
Sodium: 138 mEq/L (ref 136–145)

## 2013-03-09 LAB — IRON AND TIBC CHCC
%SAT: 31 % (ref 21–57)
Iron: 56 ug/dL (ref 41–142)
TIBC: 181 ug/dL — ABNORMAL LOW (ref 236–444)
UIBC: 125 ug/dL (ref 120–384)

## 2013-03-09 NOTE — Patient Instructions (Signed)
Dr Nelly Rout or Dr Darrold Span will be in touch with you about the pathology information when that is available  We will send prescriptions to your pharmacy for nausea medicine, for iron (Hemocyte or ferrous fumarate) and possible other medicines depending on the chemotherapy. It is fine for you to take the zofran (ondansetron) two of the 4 mg tablets every 8 hours if needed for nausea. You can use the oxycodone 5-325 one or two tablets every 6 hours as needed for pain. It will be better for kidney function and with the blood thinner if you do not use Motrin/ ibuprofen now.  We will set up CT chest (no IV contrast), and the chemotherapy teaching class.   We will set up portacath when the other appointments are made.
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
## 2013-03-09 NOTE — Telephone Encounter (Signed)
appts made and printed. Pt is aware that cs will call w/ appt for her CT chest...td

## 2013-03-09 NOTE — Telephone Encounter (Signed)
Per staff message and POF I have scheduled appts.  JMW  

## 2013-03-09 NOTE — Telephone Encounter (Signed)
Lm informing the pt to come in on 03/14/13  To have labs@ 8:45am and ov@ 9:30am..asked pt to come by on 12/22 and pick up a schedule. i also sent MW asking her to adjust the tx time.td

## 2013-03-09 NOTE — Progress Notes (Signed)
Checked in new patients with no financial issues. She has appt card and has not been to [place].

## 2013-03-10 ENCOUNTER — Encounter (HOSPITAL_COMMUNITY): Payer: Self-pay

## 2013-03-10 ENCOUNTER — Encounter: Payer: Self-pay | Admitting: Oncology

## 2013-03-10 ENCOUNTER — Telehealth: Payer: Self-pay

## 2013-03-10 ENCOUNTER — Telehealth: Payer: Self-pay | Admitting: Oncology

## 2013-03-10 ENCOUNTER — Ambulatory Visit (HOSPITAL_COMMUNITY)
Admission: RE | Admit: 2013-03-10 | Discharge: 2013-03-10 | Disposition: A | Discharge status: Home / Self Care | Payer: BC Managed Care – PPO | Source: Ambulatory Visit | Attending: Oncology | Admitting: Oncology

## 2013-03-10 DIAGNOSIS — C549 Malignant neoplasm of corpus uteri, unspecified: Secondary | ICD-10-CM | POA: Insufficient documentation

## 2013-03-10 DIAGNOSIS — I700 Atherosclerosis of aorta: Secondary | ICD-10-CM | POA: Insufficient documentation

## 2013-03-10 DIAGNOSIS — R599 Enlarged lymph nodes, unspecified: Secondary | ICD-10-CM | POA: Insufficient documentation

## 2013-03-10 DIAGNOSIS — C541 Malignant neoplasm of endometrium: Secondary | ICD-10-CM | POA: Insufficient documentation

## 2013-03-10 DIAGNOSIS — E049 Nontoxic goiter, unspecified: Secondary | ICD-10-CM | POA: Insufficient documentation

## 2013-03-10 DIAGNOSIS — J90 Pleural effusion, not elsewhere classified: Secondary | ICD-10-CM | POA: Insufficient documentation

## 2013-03-10 DIAGNOSIS — R634 Abnormal weight loss: Secondary | ICD-10-CM | POA: Insufficient documentation

## 2013-03-10 MED ORDER — LORAZEPAM 0.5 MG PO TABS: ORAL_TABLET | ORAL | Status: DC

## 2013-03-10 MED ORDER — FERROUS FUMARATE 325 (106 FE) MG PO TABS: 1.0000 | ORAL_TABLET | Freq: Every day | ORAL | Status: DC

## 2013-03-10 MED ORDER — DEXAMETHASONE 4 MG PO TABS: ORAL_TABLET | ORAL | Status: DC

## 2013-03-10 NOTE — Progress Notes (Signed)
Sallye Ober advised the patient to receive Lovenox. I found possible asst with [employer]. I will need the patient's income verf to send with application and will submit for possible asst.

## 2013-03-10 NOTE — Telephone Encounter (Signed)
[name] wanted to discuss with Dr. Darrold Span faxing office notes/recrods to Dr. [employer]ur[employer] Pea as she is employed there.  She would prefer to take office notes/records as needed to Dr. Tiburcio Pea herself or her sister [name].  Told [name] that i would give this information to Dr. Darrold Span.

## 2013-03-10 NOTE — Telephone Encounter (Signed)
Medical Oncology  Final pathology information available and reviewed. Dr Nelly Rout in agreement with taxol carboplatin. I have spoken now with patient and with sister Evelena Peat at patient's request, both at sister's home # [phone]. I have told them path confirms endometrial primary, which appears high grade and to be stage IV. She is for CT chest today and will have teaching class for chemo on 12-22; I have mentioned q 3 week taxol carboplatin, with close monitoring of renal function and premedication steroids. Ativan, Hemocyte and decadron will be sent to [facility] today and RN will also be in touch with her to give further instructions. They are aware that they should call if heavy bleeding (would hold lovenox if so) or more symptoms from anemia, or if other concerns. Patient agrees with starting chemo with peripheral IV and getting PAC after first treatment. Patient aware that I have let her other physicians know situation. Patient and sister appreciated call.  Ila Mcgill, MD

## 2013-03-10 NOTE — Telephone Encounter (Signed)
Spoke with Caitlyn Higgins and reviewed prescriptions called in for her treatment on 03-14-13.   Pt. To take decadron premed at 2400 03-13-13 and 0600 03-14-13 with food/boost Take iron tab daily.  She may stop it if it is causing nausea per Dr. Darrold Span.   Reviewed black stools and constipation with iron tabs.  Suggested Caitlyn Higgins take her colace daily not prn as iron, antiemetics , and chemotherapy meds will increase potential for constipation. She is not to go longer than three days with out a bowel movement.  Pt. Verbalized understanding.  Patient 's co-pay is $ 600.00 for her lovenox injections. 100 mg  Bid.  Spoke with Caitlyn Higgins and she will look  into co-pay assistance and contact patient. Updated patient's med list per Dr. Precious Reel office note 03-09-13.

## 2013-03-10 NOTE — Progress Notes (Signed)
Called and left a message for patient to call me back to advise to bring proof of income with her on Monday for application.

## 2013-03-11 ENCOUNTER — Other Ambulatory Visit: Payer: Self-pay | Admitting: Oncology

## 2013-03-12 ENCOUNTER — Other Ambulatory Visit: Payer: Self-pay | Admitting: Oncology

## 2013-03-13 ENCOUNTER — Other Ambulatory Visit: Payer: BC Managed Care – PPO

## 2013-03-13 ENCOUNTER — Encounter: Payer: Self-pay | Admitting: *Deleted

## 2013-03-13 ENCOUNTER — Telehealth: Payer: Self-pay | Admitting: *Deleted

## 2013-03-13 NOTE — Telephone Encounter (Signed)
No additional note

## 2013-03-14 ENCOUNTER — Other Ambulatory Visit (HOSPITAL_BASED_OUTPATIENT_CLINIC_OR_DEPARTMENT_OTHER): Payer: BC Managed Care – PPO

## 2013-03-14 ENCOUNTER — Other Ambulatory Visit: Payer: BC Managed Care – PPO

## 2013-03-14 ENCOUNTER — Encounter: Payer: Self-pay | Admitting: Oncology

## 2013-03-14 ENCOUNTER — Telehealth: Payer: Self-pay | Admitting: Oncology

## 2013-03-14 ENCOUNTER — Ambulatory Visit (HOSPITAL_BASED_OUTPATIENT_CLINIC_OR_DEPARTMENT_OTHER): Payer: BC Managed Care – PPO

## 2013-03-14 ENCOUNTER — Ambulatory Visit: Payer: BC Managed Care – PPO

## 2013-03-14 ENCOUNTER — Encounter: Payer: Self-pay | Admitting: *Deleted

## 2013-03-14 ENCOUNTER — Telehealth: Payer: Self-pay | Admitting: *Deleted

## 2013-03-14 ENCOUNTER — Ambulatory Visit: Payer: BC Managed Care – PPO | Admitting: Oncology

## 2013-03-14 ENCOUNTER — Ambulatory Visit (HOSPITAL_BASED_OUTPATIENT_CLINIC_OR_DEPARTMENT_OTHER): Payer: BC Managed Care – PPO | Admitting: Oncology

## 2013-03-14 VITALS — BP 120/82 | HR 108 | Temp 97.9°F | Resp 18 | Ht 67.91 in | Wt 230.3 lb

## 2013-03-14 VITALS — BP 98/70 | HR 84 | Temp 97.9°F | Resp 18

## 2013-03-14 DIAGNOSIS — I82409 Acute embolism and thrombosis of unspecified deep veins of unspecified lower extremity: Secondary | ICD-10-CM

## 2013-03-14 DIAGNOSIS — C541 Malignant neoplasm of endometrium: Secondary | ICD-10-CM

## 2013-03-14 DIAGNOSIS — C50919 Malignant neoplasm of unspecified site of unspecified female breast: Secondary | ICD-10-CM

## 2013-03-14 DIAGNOSIS — G8929 Other chronic pain: Secondary | ICD-10-CM

## 2013-03-14 DIAGNOSIS — Z5111 Encounter for antineoplastic chemotherapy: Secondary | ICD-10-CM

## 2013-03-14 DIAGNOSIS — Z803 Family history of malignant neoplasm of breast: Secondary | ICD-10-CM

## 2013-03-14 DIAGNOSIS — N289 Disorder of kidney and ureter, unspecified: Secondary | ICD-10-CM

## 2013-03-14 DIAGNOSIS — D509 Iron deficiency anemia, unspecified: Secondary | ICD-10-CM

## 2013-03-14 DIAGNOSIS — C801 Malignant (primary) neoplasm, unspecified: Secondary | ICD-10-CM

## 2013-03-14 DIAGNOSIS — C549 Malignant neoplasm of corpus uteri, unspecified: Secondary | ICD-10-CM

## 2013-03-14 DIAGNOSIS — D500 Iron deficiency anemia secondary to blood loss (chronic): Secondary | ICD-10-CM

## 2013-03-14 DIAGNOSIS — I82401 Acute embolism and thrombosis of unspecified deep veins of right lower extremity: Secondary | ICD-10-CM

## 2013-03-14 DIAGNOSIS — R110 Nausea: Secondary | ICD-10-CM

## 2013-03-14 DIAGNOSIS — M545 Low back pain: Secondary | ICD-10-CM

## 2013-03-14 DIAGNOSIS — Z809 Family history of malignant neoplasm, unspecified: Secondary | ICD-10-CM

## 2013-03-14 DIAGNOSIS — R634 Abnormal weight loss: Secondary | ICD-10-CM

## 2013-03-14 LAB — COMPREHENSIVE METABOLIC PANEL (CC13)
ALT: 9 U/L (ref 0–55)
Albumin: 3 g/dL — ABNORMAL LOW (ref 3.5–5.0)
Anion Gap: 11 mEq/L (ref 3–11)
CO2: 24 mEq/L (ref 22–29)
Calcium: 9.6 mg/dL (ref 8.4–10.4)
Chloride: 101 mEq/L (ref 98–109)
Glucose: 125 mg/dl (ref 70–140)
Potassium: 4 mEq/L (ref 3.5–5.1)
Sodium: 135 mEq/L — ABNORMAL LOW (ref 136–145)
Total Bilirubin: 0.4 mg/dL (ref 0.20–1.20)
Total Protein: 10 g/dL — ABNORMAL HIGH (ref 6.4–8.3)

## 2013-03-14 LAB — CBC WITH DIFFERENTIAL/PLATELET
BASO%: 0.1 % (ref 0.0–2.0)
Basophils Absolute: 0 10*3/uL (ref 0.0–0.1)
HCT: 29.9 % — ABNORMAL LOW (ref 34.8–46.6)
HGB: 9.8 g/dL — ABNORMAL LOW (ref 11.6–15.9)
MCHC: 32.8 g/dL (ref 31.5–36.0)
MONO#: 0.1 10*3/uL (ref 0.1–0.9)
MONO%: 1.2 % (ref 0.0–14.0)
NEUT%: 88.7 % — ABNORMAL HIGH (ref 38.4–76.8)
RDW: 18.4 % — ABNORMAL HIGH (ref 11.2–14.5)
WBC: 11.6 10*3/uL — ABNORMAL HIGH (ref 3.9–10.3)
lymph#: 1.2 10*3/uL (ref 0.9–3.3)

## 2013-03-14 LAB — URIC ACID (CC13): Uric Acid, Serum: 9.3 mg/dl — ABNORMAL HIGH (ref 2.6–7.4)

## 2013-03-14 MED ORDER — SODIUM CHLORIDE 0.9 % IV SOLN
Freq: Once | INTRAVENOUS | Status: AC
  Administered 2013-03-14: 12:00:00 via INTRAVENOUS

## 2013-03-14 MED ORDER — FAMOTIDINE IN NACL 20-0.9 MG/50ML-% IV SOLN
INTRAVENOUS | Status: AC
  Filled 2013-03-14: qty 50

## 2013-03-14 MED ORDER — ONDANSETRON 16 MG/50ML IVPB (CHCC)
INTRAVENOUS | Status: AC
  Filled 2013-03-14: qty 16

## 2013-03-14 MED ORDER — PACLITAXEL CHEMO INJECTION 300 MG/50ML
175.0000 mg/m2 | Freq: Once | INTRAVENOUS | Status: AC
  Administered 2013-03-14: 390 mg via INTRAVENOUS
  Filled 2013-03-14: qty 65

## 2013-03-14 MED ORDER — FAMOTIDINE IN NACL 20-0.9 MG/50ML-% IV SOLN
20.0000 mg | Freq: Once | INTRAVENOUS | Status: AC
  Administered 2013-03-14: 20 mg via INTRAVENOUS

## 2013-03-14 MED ORDER — ONDANSETRON 16 MG/50ML IVPB (CHCC)
16.0000 mg | Freq: Once | INTRAVENOUS | Status: AC
  Administered 2013-03-14: 16 mg via INTRAVENOUS

## 2013-03-14 MED ORDER — SODIUM CHLORIDE 0.9 % IV SOLN
335.1000 mg | Freq: Once | INTRAVENOUS | Status: AC
  Administered 2013-03-14: 340 mg via INTRAVENOUS
  Filled 2013-03-14: qty 34

## 2013-03-14 MED ORDER — OXYCODONE HCL 10 MG PO TABS: ORAL_TABLET | ORAL | Status: DC

## 2013-03-14 MED ORDER — DEXAMETHASONE SODIUM PHOSPHATE 20 MG/5ML IJ SOLN
INTRAMUSCULAR | Status: AC
  Filled 2013-03-14: qty 5

## 2013-03-14 MED ORDER — DEXAMETHASONE SODIUM PHOSPHATE 20 MG/5ML IJ SOLN
20.0000 mg | Freq: Once | INTRAMUSCULAR | Status: AC
  Administered 2013-03-14: 20 mg via INTRAVENOUS

## 2013-03-14 MED ORDER — OXYCODONE HCL ER 15 MG PO T12A: 15.0000 mg | EXTENDED_RELEASE_TABLET | Freq: Two times a day (BID) | ORAL | Status: DC

## 2013-03-14 MED ORDER — DIPHENHYDRAMINE HCL 50 MG/ML IJ SOLN
INTRAMUSCULAR | Status: AC
  Filled 2013-03-14: qty 1

## 2013-03-14 MED ORDER — DIPHENHYDRAMINE HCL 50 MG/ML IJ SOLN
50.0000 mg | Freq: Once | INTRAMUSCULAR | Status: AC
  Administered 2013-03-14: 50 mg via INTRAVENOUS

## 2013-03-14 NOTE — Telephone Encounter (Signed)
, °

## 2013-03-14 NOTE — Patient Instructions (Addendum)
[facility] Discharge Instructions for Patients Receiving Chemotherapy  Today you received the following chemotherapy agents Taxol/Carboplatin To help prevent nausea and vomiting after your treatment, we encourage you to take your nausea medication as prescribed.If you develop nausea and vomiting that is not controlled by your nausea medication, call the clinic.   BELOW ARE SYMPTOMS THAT SHOULD BE REPORTED IMMEDIATELY:  *FEVER GREATER THAN 100.5 F  *CHILLS WITH OR WITHOUT FEVER  NAUSEA AND VOMITING THAT IS NOT CONTROLLED WITH YOUR NAUSEA MEDICATION  *UNUSUAL SHORTNESS OF BREATH  *UNUSUAL BRUISING OR BLEEDING  TENDERNESS IN MOUTH AND THROAT WITH OR WITHOUT PRESENCE OF ULCERS  *URINARY PROBLEMS  *BOWEL PROBLEMS  UNUSUAL RASH Items with * indicate a potential emergency and should be followed up as soon as possible.  Feel free to call the clinic you have any questions or concerns. The clinic phone number is [phone].   Paclitaxel injection (Taxol) What is this medicine? PACLITAXEL (PAK li TAX el) is a chemotherapy drug. It targets fast dividing cells, like cancer cells, and causes these cells to die. This medicine is used to treat ovarian cancer, breast cancer, and other cancers. This medicine may be used for other purposes; ask your health care provider or pharmacist if you have questions. COMMON BRAND NAME(S): Onxol , Taxol What should I tell my health care provider before I take this medicine? They need to know if you have any of these conditions: -blood disorders -irregular heartbeat -infection (especially a virus infection such as chickenpox, cold sores, or herpes) -liver disease -previous or ongoing radiation therapy -an unusual or allergic reaction to paclitaxel, alcohol, polyoxyethylated castor oil, other chemotherapy agents, other medicines, foods, dyes, or preservatives -pregnant or trying to get pregnant -breast-feeding How should I use this  medicine? This drug is given as an infusion into a vein. It is administered in a hospital or clinic by a specially trained health care professional. Talk to your pediatrician regarding the use of this medicine in children. Special care may be needed. Overdosage: If you think you have taken too much of this medicine contact a poison control center or emergency room at once. NOTE: This medicine is only for you. Do not share this medicine with others. What if I miss a dose? It is important not to miss your dose. Call your doctor or health care professional if you are unable to keep an appointment. What may interact with this medicine? Do not take this medicine with any of the following medications: -disulfiram -metronidazole This medicine may also interact with the following medications: -cyclosporine -diazepam -ketoconazole -medicines to increase blood counts like filgrastim, pegfilgrastim, sargramostim -other chemotherapy drugs like cisplatin, doxorubicin, epirubicin, etoposide, teniposide, vincristine -quinidine -testosterone -vaccines -verapamil Talk to your doctor or health care professional before taking any of these medicines: -acetaminophen -aspirin -ibuprofen -ketoprofen -naproxen This list may not describe all possible interactions. Give your health care provider a list of all the medicines, herbs, non-prescription drugs, or dietary supplements you use. Also tell them if you smoke, drink alcohol, or use illegal drugs. Some items may interact with your medicine. What should I watch for while using this medicine? Your condition will be monitored carefully while you are receiving this medicine. You will need important blood work done while you are taking this medicine. This drug may make you feel generally unwell. This is not uncommon, as chemotherapy can affect healthy cells as well as cancer cells. Report any side effects. Continue your course of treatment even though  you feel ill  unless your doctor tells you to stop. In some cases, you may be given additional medicines to help with side effects. Follow all directions for their use. Call your doctor or health care professional for advice if you get a fever, chills or sore throat, or other symptoms of a cold or flu. Do not treat yourself. This drug decreases your body's ability to fight infections. Try to avoid being around people who are sick. This medicine may increase your risk to bruise or bleed. Call your doctor or health care professional if you notice any unusual bleeding. Be careful brushing and flossing your teeth or using a toothpick because you may get an infection or bleed more easily. If you have any dental work done, tell your dentist you are receiving this medicine. Avoid taking products that contain aspirin, acetaminophen, ibuprofen, naproxen, or ketoprofen unless instructed by your doctor. These medicines may hide a fever. Do not become pregnant while taking this medicine. Women should inform their doctor if they wish to become pregnant or think they might be pregnant. There is a potential for serious side effects to an unborn child. Talk to your health care professional or pharmacist for more information. Do not breast-feed an infant while taking this medicine. Men are advised not to father a child while receiving this medicine. What side effects may I notice from receiving this medicine? Side effects that you should report to your doctor or health care professional as soon as possible: -allergic reactions like skin rash, itching or hives, swelling of the face, lips, or tongue -low blood counts - This drug may decrease the number of white blood cells, red blood cells and platelets. You may be at increased risk for infections and bleeding. -signs of infection - fever or chills, cough, sore throat, pain or difficulty passing urine -signs of decreased platelets or bleeding - bruising, pinpoint red spots on the skin,  black, tarry stools, nosebleeds -signs of decreased red blood cells - unusually weak or tired, fainting spells, lightheadedness -breathing problems -chest pain -high or low blood pressure -mouth sores -nausea and vomiting -pain, swelling, redness or irritation at the injection site -pain, tingling, numbness in the hands or feet -slow or irregular heartbeat -swelling of the ankle, feet, hands Side effects that usually do not require medical attention (report to your doctor or health care professional if they continue or are bothersome): -bone pain -complete hair loss including hair on your head, underarms, pubic hair, eyebrows, and eyelashes -changes in the color of fingernails -diarrhea -loosening of the fingernails -loss of appetite -muscle or joint pain -red flush to skin -sweating This list may not describe all possible side effects. Call your doctor for medical advice about side effects. You may report side effects to FDA at 1-800-FDA-1088. Where should I keep my medicine? This drug is given in a hospital or clinic and will not be stored at home. NOTE: This sheet is a summary. It may not cover all possible information. If you have questions about this medicine, talk to your doctor, pharmacist, or health care provider.  2014, Elsevier/Gold Standard. (2012-05-02 16:41:21)   Carboplatin injection What is this medicine? CARBOPLATIN (KAR boe pla tin) is a chemotherapy drug. It targets fast dividing cells, like cancer cells, and causes these cells to die. This medicine is used to treat ovarian cancer and many other cancers. This medicine may be used for other purposes; ask your health care provider or pharmacist if you have questions. COMMON BRAND NAME(S):  NAME(S): Paraplatin What should I tell my health care provider before I take this medicine? They need to know if you have any of these conditions: -blood disorders -hearing problems -kidney disease -recent or ongoing radiation  therapy -an unusual or allergic reaction to carboplatin, cisplatin, other chemotherapy, other medicines, foods, dyes, or preservatives -pregnant or trying to get pregnant -breast-feeding How should I use this medicine? This drug is usually given as an infusion into a vein. It is administered in a hospital or clinic by a specially trained health care professional. Talk to your pediatrician regarding the use of this medicine in children. Special care may be needed. Overdosage: If you think you have taken too much of this medicine contact a poison control center or emergency room at once. NOTE: This medicine is only for you. Do not share this medicine with others. What if I miss a dose? It is important not to miss a dose. Call your doctor or health care professional if you are unable to keep an appointment. What may interact with this medicine? -medicines for seizures -medicines to increase blood counts like filgrastim, pegfilgrastim, sargramostim -some antibiotics like amikacin, gentamicin, neomycin, streptomycin, tobramycin -vaccines Talk to your doctor or health care professional before taking any of these medicines: -acetaminophen -aspirin -ibuprofen -ketoprofen -naproxen This list may not describe all possible interactions. Give your health care provider a list of all the medicines, herbs, non-prescription drugs, or dietary supplements you use. Also tell them if you smoke, drink alcohol, or use illegal drugs. Some items may interact with your medicine. What should I watch for while using this medicine? Your condition will be monitored carefully while you are receiving this medicine. You will need important blood work done while you are taking this medicine. This drug may make you feel generally unwell. This is not uncommon, as chemotherapy can affect healthy cells as well as cancer cells. Report any side effects. Continue your course of treatment even though you feel ill unless your doctor  tells you to stop. In some cases, you may be given additional medicines to help with side effects. Follow all directions for their use. Call your doctor or health care professional for advice if you get a fever, chills or sore throat, or other symptoms of a cold or flu. Do not treat yourself. This drug decreases your body's ability to fight infections. Try to avoid being around people who are sick. This medicine may increase your risk to bruise or bleed. Call your doctor or health care professional if you notice any unusual bleeding. Be careful brushing and flossing your teeth or using a toothpick because you may get an infection or bleed more easily. If you have any dental work done, tell your dentist you are receiving this medicine. Avoid taking products that contain aspirin, acetaminophen, ibuprofen, naproxen, or ketoprofen unless instructed by your doctor. These medicines may hide a fever. Do not become pregnant while taking this medicine. Women should inform their doctor if they wish to become pregnant or think they might be pregnant. There is a potential for serious side effects to an unborn child. Talk to your health care professional or pharmacist for more information. Do not breast-feed an infant while taking this medicine. What side effects may I notice from receiving this medicine? Side effects that you should report to your doctor or health care professional as soon as possible: -allergic reactions like skin rash, itching or hives, swelling of the face, lips, or tongue -signs of infection - fever or   cough, sore throat, pain or difficulty passing urine -signs of decreased platelets or bleeding - bruising, pinpoint red spots on the skin, black, tarry stools, nosebleeds -signs of decreased red blood cells - unusually weak or tired, fainting spells, lightheadedness -breathing problems -changes in hearing -changes in vision -chest pain -high blood pressure -low blood counts - This  drug may decrease the number of white blood cells, red blood cells and platelets. You may be at increased risk for infections and bleeding. -nausea and vomiting -pain, swelling, redness or irritation at the injection site -pain, tingling, numbness in the hands or feet -problems with balance, talking, walking -trouble passing urine or change in the amount of urine Side effects that usually do not require medical attention (report to your doctor or health care professional if they continue or are bothersome): -hair loss -loss of appetite -metallic taste in the mouth or changes in taste This list may not describe all possible side effects. Call your doctor for medical advice about side effects. You may report side effects to FDA at 1-800-FDA-1088. Where should I keep my medicine? This drug is given in a hospital or clinic and will not be stored at home. NOTE: This sheet is a summary. It may not cover all possible information. If you have questions about this medicine, talk to your doctor, pharmacist, or health care provider.  2014, [employer]vier/Gold Standard. (2007-06-14 14:38:05)

## 2013-03-14 NOTE — Progress Notes (Signed)
[facility] Psychosocial Distress Screening Clinical Social Work  Clinical Social Work was referred by distress screening protocol.  The patient scored a 5 on the Psychosocial Distress Thermometer which indicates moderate distress. Clinical Social Worker met with patient in chemo room to assess for distress and other psychosocial needs.  The patient shares she feels the first day of chemotherapy is going well and feels comfortable with the information she's received.  She reports her biggest concern is the cost of her Lovenox prescription and she over qualifies for any assistance.  CSW unaware of other assistance programs, but will discuss with Sallye Ober, Charity fundraiser.  CSW briefly explained CSW role and encouraged patient to attend GYN support group.  The patient plans to contact CSW with any questions or concerns.   Clinical Social Worker follow up needed: no  If yes, follow up plan:   Kathrin Penner, MSW, LCSW Clinical Social Worker [facility] [phone]
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
## 2013-03-14 NOTE — Progress Notes (Signed)
Patient is overqual for asst. I have her Lauren's card for possible asst. I advised Louise.

## 2013-03-14 NOTE — Telephone Encounter (Signed)
Per staff message and POF I have scheduled appts.  JMW  

## 2013-03-14 NOTE — Patient Instructions (Signed)
Night of chemo take ativan (lorazepam) at bedtime whether or not any nausea.  AM after chemo take zofran (ondansetron) 8 mg whether or not any nausea. Other than these doses, you can take nausea medicines as instructed if you have nausea  We will start oxycontin, which is sustained release oxycodone. You will take this every 12 hours whether or not any pain, to keep it in your system. It will take 2-3 doses to build up in your system enough to notice a difference, and Dr Darrold Span may need to adjust the dose at next visit depending on how your pain is. You can still take the breakthru pain medicine as needed for pain in addition to the sustained release tablets. We will change your hydrocodone to oxycodone for breakthru now.  You may need to increase stool softeners or laxatives with increase in pain medicine and with chemo, as these can make you more constipated. Colace stool softener twice daily is good. Over the counter miralax (glycolax) one capful daily is fine, and senokot S or pharmacy equivalent 2 tablets once or twice daily is fine.   You can call if any questions or problems   [phone]

## 2013-03-15 ENCOUNTER — Telehealth: Payer: Self-pay | Admitting: *Deleted

## 2013-03-15 ENCOUNTER — Telehealth: Payer: Self-pay

## 2013-03-15 DIAGNOSIS — C541 Malignant neoplasm of endometrium: Secondary | ICD-10-CM

## 2013-03-15 MED ORDER — ALLOPURINOL 100 MG PO TABS: 100.0000 mg | ORAL_TABLET | Freq: Every day | ORAL | Status: DC

## 2013-03-15 NOTE — Telephone Encounter (Signed)
Message copied by Lorine Bears on Wed Mar 15, 2013 12:46 PM ------      Message from: Reece Packer      Created: Wed Mar 15, 2013 12:17 PM       Labs seen and need follow up: please have her start allopurinol 100 mg daily and remind her to push fluids. Allopurinol #30 ------

## 2013-03-15 NOTE — Telephone Encounter (Signed)
Called Caitlyn Higgins for chemotherapy F/U.  Patient is doing well.  Denies n/v.  Reports taking the lorazapam last night and zofran ODT this morning.  "Eating better than she has in a long time."  Denies any new side effects or symptoms.  Bowel and bladder is functioning well.  Eating and drinking well and I instructed to drink 64 oz minimum daily or at least the day before, of and after treatment.  Denies questions at this time and encouraged to call if needed.  Reviewed how to call after hours in the case of an emergency.

## 2013-03-15 NOTE — Telephone Encounter (Signed)
Message copied by Augusto Garbe on Wed Mar 15, 2013 11:23 AM ------      Message from: Corky Sing      Created: Tue Mar 14, 2013  4:28 PM      Regarding: Chemo follow up call      Contact: [phone]       First time Taxol/Carboplatin. Dr. Darrold Span patient. Please call.            Thanks,      Santina Evans, RN ------

## 2013-03-15 NOTE — Telephone Encounter (Signed)
Told Caitlyn Higgins that her uric acid level was high yesterday 9.3.  She needs to drink 64 oz of fluid daily to help flush kidneys.  The allopurinol will also help kidneys get rid of waste better.  Pt. verbalized understanding.

## 2013-03-21 ENCOUNTER — Other Ambulatory Visit (HOSPITAL_BASED_OUTPATIENT_CLINIC_OR_DEPARTMENT_OTHER): Payer: BC Managed Care – PPO

## 2013-03-21 ENCOUNTER — Ambulatory Visit: Payer: BC Managed Care – PPO | Admitting: Nutrition

## 2013-03-21 ENCOUNTER — Ambulatory Visit (HOSPITAL_BASED_OUTPATIENT_CLINIC_OR_DEPARTMENT_OTHER): Payer: BC Managed Care – PPO | Admitting: Oncology

## 2013-03-21 ENCOUNTER — Encounter: Payer: Self-pay | Admitting: Oncology

## 2013-03-21 ENCOUNTER — Encounter (HOSPITAL_COMMUNITY)
Admission: RE | Admit: 2013-03-21 | Discharge: 2013-03-21 | Disposition: A | Discharge status: Home / Self Care | Payer: BC Managed Care – PPO | Source: Ambulatory Visit | Attending: Oncology | Admitting: Oncology

## 2013-03-21 ENCOUNTER — Ambulatory Visit (HOSPITAL_BASED_OUTPATIENT_CLINIC_OR_DEPARTMENT_OTHER): Payer: BC Managed Care – PPO

## 2013-03-21 VITALS — BP 111/51 | HR 127 | Temp 99.1°F | Resp 20 | Wt 222.4 lb

## 2013-03-21 VITALS — BP 106/55 | HR 98 | Temp 98.5°F | Resp 18

## 2013-03-21 DIAGNOSIS — I82409 Acute embolism and thrombosis of unspecified deep veins of unspecified lower extremity: Secondary | ICD-10-CM

## 2013-03-21 DIAGNOSIS — Z7901 Long term (current) use of anticoagulants: Secondary | ICD-10-CM

## 2013-03-21 DIAGNOSIS — D500 Iron deficiency anemia secondary to blood loss (chronic): Secondary | ICD-10-CM

## 2013-03-21 DIAGNOSIS — C549 Malignant neoplasm of corpus uteri, unspecified: Secondary | ICD-10-CM

## 2013-03-21 DIAGNOSIS — N920 Excessive and frequent menstruation with regular cycle: Secondary | ICD-10-CM

## 2013-03-21 DIAGNOSIS — C541 Malignant neoplasm of endometrium: Secondary | ICD-10-CM

## 2013-03-21 DIAGNOSIS — D649 Anemia, unspecified: Secondary | ICD-10-CM | POA: Insufficient documentation

## 2013-03-21 DIAGNOSIS — E43 Unspecified severe protein-calorie malnutrition: Secondary | ICD-10-CM

## 2013-03-21 DIAGNOSIS — I82401 Acute embolism and thrombosis of unspecified deep veins of right lower extremity: Secondary | ICD-10-CM

## 2013-03-21 DIAGNOSIS — N289 Disorder of kidney and ureter, unspecified: Secondary | ICD-10-CM

## 2013-03-21 LAB — BASIC METABOLIC PANEL (CC13)
Anion Gap: 13 mEq/L — ABNORMAL HIGH (ref 3–11)
BUN: 19.6 mg/dL (ref 7.0–26.0)
Chloride: 99 mEq/L (ref 98–109)
Potassium: 4.3 mEq/L (ref 3.5–5.1)

## 2013-03-21 LAB — CBC WITH DIFFERENTIAL/PLATELET
BASO%: 0.4 % (ref 0.0–2.0)
EOS%: 1.4 % (ref 0.0–7.0)
HCT: 24.7 % — ABNORMAL LOW (ref 34.8–46.6)
LYMPH%: 11.6 % — ABNORMAL LOW (ref 14.0–49.7)
MCHC: 31.6 g/dL (ref 31.5–36.0)
MONO#: 0.3 10*3/uL (ref 0.1–0.9)
NEUT#: 4 10*3/uL (ref 1.5–6.5)
NEUT%: 81.5 % — ABNORMAL HIGH (ref 38.4–76.8)
RBC: 2.86 10*6/uL — ABNORMAL LOW (ref 3.70–5.45)
WBC: 4.9 10*3/uL (ref 3.9–10.3)
lymph#: 0.6 10*3/uL — ABNORMAL LOW (ref 0.9–3.3)
nRBC: 0 % (ref 0–0)

## 2013-03-21 LAB — HOLD TUBE, BLOOD BANK

## 2013-03-21 LAB — PREPARE RBC (CROSSMATCH)

## 2013-03-21 LAB — ABO/RH: ABO/RH(D): O POS

## 2013-03-21 MED ORDER — MORPHINE SULFATE 4 MG/ML IJ SOLN
2.0000 mg | Freq: Once | INTRAMUSCULAR | Status: AC
  Administered 2013-03-21: 2 mg via INTRAVENOUS

## 2013-03-21 MED ORDER — MORPHINE SULFATE 4 MG/ML IJ SOLN
INTRAMUSCULAR | Status: AC
  Filled 2013-03-21: qty 1

## 2013-03-21 MED ORDER — DEXAMETHASONE SODIUM PHOSPHATE 10 MG/ML IJ SOLN
6.0000 mg | Freq: Once | INTRAMUSCULAR | Status: AC
  Administered 2013-03-21: 6 mg via INTRAVENOUS

## 2013-03-21 MED ORDER — SODIUM CHLORIDE 0.9 % IV SOLN
INTRAVENOUS | Status: DC
  Administered 2013-03-21: 12:00:00 via INTRAVENOUS

## 2013-03-21 MED ORDER — LORAZEPAM 1 MG PO TABS
ORAL_TABLET | ORAL | Status: AC
  Filled 2013-03-21: qty 1

## 2013-03-21 MED ORDER — ACETAMINOPHEN 325 MG PO TABS
ORAL_TABLET | ORAL | Status: AC
  Filled 2013-03-21: qty 2

## 2013-03-21 MED ORDER — SODIUM CHLORIDE 0.9 % IV SOLN
250.0000 mL | Freq: Once | INTRAVENOUS | Status: AC
  Administered 2013-03-21: 250 mL via INTRAVENOUS

## 2013-03-21 MED ORDER — ONDANSETRON 8 MG/50ML IVPB (CHCC)
8.0000 mg | Freq: Once | INTRAVENOUS | Status: AC | PRN
  Administered 2013-03-21: 8 mg via INTRAVENOUS

## 2013-03-21 MED ORDER — ONDANSETRON 8 MG/NS 50 ML IVPB
INTRAVENOUS | Status: AC
  Filled 2013-03-21: qty 8

## 2013-03-21 MED ORDER — ACETAMINOPHEN 325 MG PO TABS
650.0000 mg | ORAL_TABLET | Freq: Once | ORAL | Status: AC
  Administered 2013-03-21: 650 mg via ORAL

## 2013-03-21 MED ORDER — DEXAMETHASONE SODIUM PHOSPHATE 10 MG/ML IJ SOLN
INTRAMUSCULAR | Status: AC
  Filled 2013-03-21: qty 1

## 2013-03-21 MED ORDER — LORAZEPAM 1 MG PO TABS
0.5000 mg | ORAL_TABLET | Freq: Once | ORAL | Status: AC
  Administered 2013-03-21: 0.5 mg via ORAL

## 2013-03-21 NOTE — Progress Notes (Signed)
Patient is a 47 year old female diagnosed with endometrial cancer receiving chemotherapy.  She is a patient of Dr. Darrold Span.  Past medical history includes reflux and hypertension.  Medications include Decadron, Colace, Nexium, lorazepam, Zofran, and oxycodone.  Labs include sodium 134, and creatinine 1.2 on December 30.  Height: 67 inches. Weight: 222 pounds December 30. Usual body weight: 277 pounds in May 2014. BMI: 33.9.  Patient complains of nausea not completely relieved by, Zofran, 4.  Lorazepam.  Patient states she does not take these meds medications as scheduled.  Patient has lost 20% of her usual body weight over 7 months.  She is trying to eat frequently throughout the day.  However, admits to decreased oral intake.  Patient's sister helps prepare meals and remind patient to eat.  Patient reports difficulty increasing fluids.  Patient does drink oral nutrition supplements one to 3 times daily.  Complains of lactose intolerance and states sometimes these oral nutrition supplements bother her.  Nutrition focused Physical exam was deferred secondary to patient receiving blood today.  Patient meets criteria for severe malnutrition in the context of acute illness secondary to greater than 7-1/2% weight loss in 3 months and less than 50% of estimated energy requirements for greater than 5 days.  Nutrition diagnosis: Inadequate oral intake related to diagnosis of endometrial cancer and associated treatments as evidenced by 55 pound weight loss over 7 months.  Intervention: Patient educated on strategies for eating with nausea.  Patient educated to take nausea medication as prescribed by physician.  I recommended patient increase oral intake to a minimum of 6 small, frequent meals or snacks daily.  Recommended increase oral nutrition supplements 3 times a day between meals.  Patient can improve tolerance of oral nutrition supplements by drinking them slowly and limiting them to 4 ounces at a  time.  Patient educated on strategies for increasing overall calories and protein to minimize further weight loss.  Fact sheets were provided.  Questions were answered. My contact information was given.  Teach back method used.  Monitoring, evaluation, goals: Patient will tolerate increased calories and protein with oral nutrition supplements 3 times a day to minimize weight loss and promote weight stabilization.  Next visit: Tuesday, January 13, during chemotherapy.
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
## 2013-03-21 NOTE — Patient Instructions (Signed)
[facility] Discharge Instructions for Patients Receiving Chemotherapy  Today you received Decadron and Zofran for nausea, as well as 1 unit of blood transfusion..  To help prevent nausea and vomiting after your treatment, we encourage you to take your nausea medication.   If you develop nausea and vomiting that is not controlled by your nausea medication, call the clinic.   BELOW ARE SYMPTOMS THAT SHOULD BE REPORTED IMMEDIATELY:  *FEVER GREATER THAN 100.5 F  *CHILLS WITH OR WITHOUT FEVER  NAUSEA AND VOMITING THAT IS NOT CONTROLLED WITH YOUR NAUSEA MEDICATION  *UNUSUAL SHORTNESS OF BREATH  *UNUSUAL BRUISING OR BLEEDING  TENDERNESS IN MOUTH AND THROAT WITH OR WITHOUT PRESENCE OF ULCERS  *URINARY PROBLEMS  *BOWEL PROBLEMS  UNUSUAL RASH Items with * indicate a potential emergency and should be followed up as soon as possible.  Feel free to call the clinic you have any questions or concerns. The clinic phone number is [phone].     Blood Transfusion Information WHAT IS A BLOOD TRANSFUSION? A transfusion is the replacement of blood or some of its parts. Blood is made up of multiple cells which provide different functions.  Red blood cells carry oxygen and are used for blood loss replacement.  White blood cells fight against infection.  Platelets control bleeding.  Plasma helps clot blood.  Other blood products are available for specialized needs, such as hemophilia or other clotting disorders. BEFORE THE TRANSFUSION  Who gives blood for transfusions?   You may be able to donate blood to be used at a later date on yourself (autologous donation).  Relatives can be asked to donate blood. This is generally not any safer than if you have received blood from a stranger. The same precautions are taken to ensure safety when a relative's blood is donated.  Healthy volunteers who are fully evaluated to make sure their blood is safe. This is blood bank  blood. Transfusion therapy is the safest it has ever been in the practice of medicine. Before blood is taken from a donor, a complete history is taken to make sure that person has no history of diseases nor engages in risky social behavior (examples are intravenous drug use or sexual activity with multiple partners). The donor's travel history is screened to minimize risk of transmitting infections, such as malaria. The donated blood is tested for signs of infectious diseases, such as HIV and hepatitis. The blood is then tested to be sure it is compatible with you in order to minimize the chance of a transfusion reaction. If you or a relative donates blood, this is often done in anticipation of surgery and is not appropriate for emergency situations. It takes many days to process the donated blood. RISKS AND COMPLICATIONS Although transfusion therapy is very safe and saves many lives, the main dangers of transfusion include:   Getting an infectious disease.  Developing a transfusion reaction. This is an allergic reaction to something in the blood you were given. Every precaution is taken to prevent this. The decision to have a blood transfusion has been considered carefully by your caregiver before blood is given. Blood is not given unless the benefits outweigh the risks. AFTER THE TRANSFUSION  Right after receiving a blood transfusion, you will usually feel much better and more energetic. This is especially true if your red blood cells have gotten low (anemic). The transfusion raises the level of the red blood cells which carry oxygen, and this usually causes an energy increase.  The  nurse administering the transfusion will monitor you carefully for complications. HOME CARE INSTRUCTIONS  No special instructions are needed after a transfusion. You may find your energy is better. Speak with your caregiver about any limitations on activity for underlying diseases you may have. SEEK MEDICAL CARE IF:    Your condition is not improving after your transfusion.  You develop redness or irritation at the intravenous (IV) site. SEEK IMMEDIATE MEDICAL CARE IF:  Any of the following symptoms occur over the next 12 hours:  Shaking chills.  You have a temperature by mouth above 102 F (38.9 C), not controlled by medicine.  Chest, back, or muscle pain.  People around you feel you are not acting correctly or are confused.  Shortness of breath or difficulty breathing.  Dizziness and fainting.  You get a rash or develop hives.  You have a decrease in urine output.  Your urine turns a dark color or changes to pink, red, or brown. Any of the following symptoms occur over the next 10 days:  You have a temperature by mouth above 102 F (38.9 C), not controlled by medicine.  Shortness of breath.  Weakness after normal activity.  The white part of the eye turns yellow (jaundice).  You have a decrease in the amount of urine or are urinating less often.  Your urine turns a dark color or changes to pink, red, or brown. Document Released: 03/06/2000 Document Revised: 06/01/2011 Document Reviewed: 10/24/2007 [facility] Patient Information 2014 [place], [place].

## 2013-03-22 LAB — TYPE AND SCREEN
Antibody Screen: NEGATIVE
Unit division: 0

## 2013-03-23 DIAGNOSIS — C569 Malignant neoplasm of unspecified ovary: Secondary | ICD-10-CM

## 2013-03-23 HISTORY — DX: Malignant neoplasm of unspecified ovary: C56.9

## 2013-03-24 ENCOUNTER — Telehealth: Payer: Self-pay

## 2013-03-24 NOTE — Telephone Encounter (Signed)
[name] is feeling much better than she did not 03-21-13.  She is afebrile. Sh is using ativan 0.5 mg 2 tabs for nausea.  This very effective.  Her appetite has improved.  She is drinkin ~1 1/2 -2 liters per day. Vaginal bleeding decreased greatly.  She is going through 2 pads in a day. Her pain level in abdomen is a 5/10.  Taking Oxycontin 15 mg q 12 hours.  Taking 2 Oxycodone 10 mg twice a day for breakthrough pain.  Suggested that she take a tablet ~6 hours  and see if pain level can be decreased to 2-3/10. Told [name] that she needs to call if she has a temp of 100.5 or greater as she is near her low point in her wbc.  [name] understanding and appreciates the follow up call. She does not need to come in today for additional lab as she is feel much better per Dr. Marko Plume.

## 2013-03-28 ENCOUNTER — Other Ambulatory Visit: Payer: Self-pay | Admitting: Oncology

## 2013-03-28 ENCOUNTER — Other Ambulatory Visit: Payer: Self-pay | Admitting: *Deleted

## 2013-03-28 DIAGNOSIS — C541 Malignant neoplasm of endometrium: Secondary | ICD-10-CM

## 2013-03-28 DIAGNOSIS — I82401 Acute embolism and thrombosis of unspecified deep veins of right lower extremity: Secondary | ICD-10-CM

## 2013-03-28 MED ORDER — OXYCODONE HCL 10 MG PO TABS: ORAL_TABLET | ORAL | Status: DC

## 2013-03-28 NOTE — Telephone Encounter (Signed)
Patient called asking for refill on ocycodone 10 mg pills.  Reports she ran out yesterday, trying to wait for tomorrow's f/u but can't wait.  Denies any new change in pain only that she has run out.  Will notify on-call provider of this request.  Lattie Haw can be reached at [phone].

## 2013-03-29 ENCOUNTER — Ambulatory Visit (HOSPITAL_BASED_OUTPATIENT_CLINIC_OR_DEPARTMENT_OTHER): Payer: BC Managed Care – PPO | Admitting: Oncology

## 2013-03-29 ENCOUNTER — Encounter: Payer: Self-pay | Admitting: Oncology

## 2013-03-29 ENCOUNTER — Telehealth: Payer: Self-pay | Admitting: Oncology

## 2013-03-29 ENCOUNTER — Other Ambulatory Visit (HOSPITAL_BASED_OUTPATIENT_CLINIC_OR_DEPARTMENT_OTHER): Payer: BC Managed Care – PPO

## 2013-03-29 VITALS — BP 101/59 | HR 110 | Temp 98.2°F | Resp 20 | Ht 67.91 in | Wt 220.6 lb

## 2013-03-29 DIAGNOSIS — R110 Nausea: Secondary | ICD-10-CM

## 2013-03-29 DIAGNOSIS — C549 Malignant neoplasm of corpus uteri, unspecified: Secondary | ICD-10-CM

## 2013-03-29 DIAGNOSIS — R109 Unspecified abdominal pain: Secondary | ICD-10-CM

## 2013-03-29 DIAGNOSIS — D500 Iron deficiency anemia secondary to blood loss (chronic): Secondary | ICD-10-CM

## 2013-03-29 DIAGNOSIS — I82401 Acute embolism and thrombosis of unspecified deep veins of right lower extremity: Secondary | ICD-10-CM

## 2013-03-29 DIAGNOSIS — R778 Other specified abnormalities of plasma proteins: Secondary | ICD-10-CM

## 2013-03-29 DIAGNOSIS — E46 Unspecified protein-calorie malnutrition: Secondary | ICD-10-CM

## 2013-03-29 DIAGNOSIS — C541 Malignant neoplasm of endometrium: Secondary | ICD-10-CM

## 2013-03-29 DIAGNOSIS — N898 Other specified noninflammatory disorders of vagina: Secondary | ICD-10-CM

## 2013-03-29 DIAGNOSIS — R799 Abnormal finding of blood chemistry, unspecified: Secondary | ICD-10-CM

## 2013-03-29 DIAGNOSIS — C55 Malignant neoplasm of uterus, part unspecified: Secondary | ICD-10-CM

## 2013-03-29 DIAGNOSIS — I82409 Acute embolism and thrombosis of unspecified deep veins of unspecified lower extremity: Secondary | ICD-10-CM

## 2013-03-29 LAB — COMPREHENSIVE METABOLIC PANEL (CC13)
ALK PHOS: 68 U/L (ref 40–150)
ALT: 9 U/L (ref 0–55)
ANION GAP: 11 meq/L (ref 3–11)
AST: 28 U/L (ref 5–34)
Albumin: 3.5 g/dL (ref 3.5–5.0)
BUN: 17.6 mg/dL (ref 7.0–26.0)
CHLORIDE: 101 meq/L (ref 98–109)
CO2: 25 mEq/L (ref 22–29)
Calcium: 10 mg/dL (ref 8.4–10.4)
Creatinine: 1.4 mg/dL — ABNORMAL HIGH (ref 0.6–1.1)
GLUCOSE: 105 mg/dL (ref 70–140)
Potassium: 4.1 mEq/L (ref 3.5–5.1)
Sodium: 137 mEq/L (ref 136–145)
TOTAL PROTEIN: 9.3 g/dL — AB (ref 6.4–8.3)
Total Bilirubin: 0.46 mg/dL (ref 0.20–1.20)

## 2013-03-29 LAB — CBC WITH DIFFERENTIAL/PLATELET
BASO%: 1.1 % (ref 0.0–2.0)
BASOS ABS: 0 10*3/uL (ref 0.0–0.1)
EOS%: 0.7 % (ref 0.0–7.0)
Eosinophils Absolute: 0 10*3/uL (ref 0.0–0.5)
HCT: 27.1 % — ABNORMAL LOW (ref 34.8–46.6)
HGB: 9 g/dL — ABNORMAL LOW (ref 11.6–15.9)
LYMPH#: 0.8 10*3/uL — AB (ref 0.9–3.3)
LYMPH%: 22.3 % (ref 14.0–49.7)
MCH: 28.9 pg (ref 25.1–34.0)
MCHC: 33.2 g/dL (ref 31.5–36.0)
MCV: 87.2 fL (ref 79.5–101.0)
MONO#: 0.7 10*3/uL (ref 0.1–0.9)
MONO%: 17.4 % — ABNORMAL HIGH (ref 0.0–14.0)
NEUT#: 2.2 10*3/uL (ref 1.5–6.5)
NEUT%: 58.5 % (ref 38.4–76.8)
Platelets: 273 10*3/uL (ref 145–400)
RBC: 3.11 10*6/uL — ABNORMAL LOW (ref 3.70–5.45)
RDW: 18.5 % — AB (ref 11.2–14.5)
WBC: 3.8 10*3/uL — ABNORMAL LOW (ref 3.9–10.3)

## 2013-03-29 MED ORDER — LIDOCAINE-PRILOCAINE 2.5-2.5 % EX CREA: 1.0000 "application " | TOPICAL_CREAM | CUTANEOUS | Status: DC | PRN
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
## 2013-03-29 NOTE — Telephone Encounter (Signed)
, °

## 2013-03-29 NOTE — Patient Instructions (Signed)
Do not take lovenox shot on Sunday Jan 11 or before portacath placement on Mon Jan 12. You can start back after the portacath is done, as soon as the radiologist tells you it is ok. We will begin coumadin later next week.  You can take senokot 1-2 tablets once or twice daily, and the colace once or twice daily. Try to keep your bowels moving well daily.

## 2013-03-30 ENCOUNTER — Other Ambulatory Visit: Payer: Self-pay | Admitting: Radiology

## 2013-03-30 ENCOUNTER — Encounter (HOSPITAL_COMMUNITY): Payer: Self-pay | Admitting: Pharmacy Technician

## 2013-03-30 LAB — CA 125: CA 125: 342.5 U/mL — ABNORMAL HIGH (ref 0.0–30.2)

## 2013-04-01 ENCOUNTER — Encounter: Payer: Self-pay | Admitting: Oncology

## 2013-04-01 ENCOUNTER — Other Ambulatory Visit: Payer: Self-pay | Admitting: Oncology

## 2013-04-03 ENCOUNTER — Other Ambulatory Visit: Payer: BC Managed Care – PPO

## 2013-04-03 ENCOUNTER — Telehealth: Payer: Self-pay

## 2013-04-03 ENCOUNTER — Encounter (HOSPITAL_COMMUNITY): Payer: Self-pay

## 2013-04-03 ENCOUNTER — Ambulatory Visit: Payer: BC Managed Care – PPO | Admitting: Oncology

## 2013-04-03 ENCOUNTER — Ambulatory Visit (HOSPITAL_COMMUNITY): Admission: RE | Admit: 2013-04-03 | Payer: BC Managed Care – PPO | Source: Ambulatory Visit

## 2013-04-03 ENCOUNTER — Ambulatory Visit (HOSPITAL_COMMUNITY)
Admission: RE | Admit: 2013-04-03 | Discharge: 2013-04-03 | Disposition: A | Discharge status: Home / Self Care | Payer: BC Managed Care – PPO | Source: Ambulatory Visit | Attending: Oncology | Admitting: Oncology

## 2013-04-03 ENCOUNTER — Other Ambulatory Visit: Payer: Self-pay

## 2013-04-03 ENCOUNTER — Other Ambulatory Visit: Payer: Self-pay | Admitting: Pharmacist

## 2013-04-03 ENCOUNTER — Other Ambulatory Visit: Payer: Self-pay | Admitting: Oncology

## 2013-04-03 ENCOUNTER — Other Ambulatory Visit (HOSPITAL_COMMUNITY): Payer: BC Managed Care – PPO

## 2013-04-03 DIAGNOSIS — K219 Gastro-esophageal reflux disease without esophagitis: Secondary | ICD-10-CM | POA: Insufficient documentation

## 2013-04-03 DIAGNOSIS — I82401 Acute embolism and thrombosis of unspecified deep veins of right lower extremity: Secondary | ICD-10-CM

## 2013-04-03 DIAGNOSIS — C541 Malignant neoplasm of endometrium: Secondary | ICD-10-CM

## 2013-04-03 DIAGNOSIS — C549 Malignant neoplasm of corpus uteri, unspecified: Secondary | ICD-10-CM | POA: Insufficient documentation

## 2013-04-03 DIAGNOSIS — I824Y9 Acute embolism and thrombosis of unspecified deep veins of unspecified proximal lower extremity: Secondary | ICD-10-CM | POA: Insufficient documentation

## 2013-04-03 DIAGNOSIS — R609 Edema, unspecified: Secondary | ICD-10-CM | POA: Insufficient documentation

## 2013-04-03 DIAGNOSIS — Z79899 Other long term (current) drug therapy: Secondary | ICD-10-CM | POA: Insufficient documentation

## 2013-04-03 DIAGNOSIS — I10 Essential (primary) hypertension: Secondary | ICD-10-CM | POA: Insufficient documentation

## 2013-04-03 HISTORY — DX: Malignant (primary) neoplasm, unspecified: C80.1

## 2013-04-03 LAB — BASIC METABOLIC PANEL
BUN: 21 mg/dL (ref 6–23)
CHLORIDE: 98 meq/L (ref 96–112)
CO2: 25 mEq/L (ref 19–32)
CREATININE: 1.33 mg/dL — AB (ref 0.50–1.10)
Calcium: 9.7 mg/dL (ref 8.4–10.5)
GFR calc non Af Amer: 46 mL/min — ABNORMAL LOW (ref >90)
GFR, EST AFRICAN AMERICAN: 53 mL/min — AB (ref >90)
Glucose, Bld: 98 mg/dL (ref 70–99)
POTASSIUM: 4 meq/L (ref 3.7–5.3)
Sodium: 139 mEq/L (ref 137–147)

## 2013-04-03 LAB — CBC
HEMATOCRIT: 28.4 % — AB (ref 36.0–46.0)
Hemoglobin: 9.2 g/dL — ABNORMAL LOW (ref 12.0–15.0)
MCH: 28.7 pg (ref 26.0–34.0)
MCHC: 32.4 g/dL (ref 30.0–36.0)
MCV: 88.5 fL (ref 78.0–100.0)
PLATELETS: 298 10*3/uL (ref 150–400)
RBC: 3.21 MIL/uL — ABNORMAL LOW (ref 3.87–5.11)
RDW: 18.2 % — AB (ref 11.5–15.5)
WBC: 7.1 10*3/uL (ref 4.0–10.5)

## 2013-04-03 LAB — POCT INR: INR: 1.03

## 2013-04-03 LAB — DIFFERENTIAL
BASOS PCT: 1 % (ref 0–1)
Basophils Absolute: 0 10*3/uL (ref 0.0–0.1)
Eosinophils Absolute: 0.2 10*3/uL (ref 0.0–0.7)
Eosinophils Relative: 2 % (ref 0–5)
Lymphocytes Relative: 15 % (ref 12–46)
Lymphs Abs: 1 10*3/uL (ref 0.7–4.0)
Monocytes Absolute: 0.7 10*3/uL (ref 0.1–1.0)
Monocytes Relative: 10 % (ref 3–12)
NEUTROS PCT: 73 % (ref 43–77)
Neutro Abs: 5.1 10*3/uL (ref 1.7–7.7)

## 2013-04-03 LAB — PROTIME-INR
INR: 1.03 (ref 0.00–1.49)
Prothrombin Time: 13.3 seconds (ref 11.6–15.2)

## 2013-04-03 LAB — APTT: aPTT: 35 seconds (ref 24–37)

## 2013-04-03 MED ORDER — MIDAZOLAM HCL 2 MG/2ML IJ SOLN
INTRAMUSCULAR | Status: AC
  Filled 2013-04-03: qty 6

## 2013-04-03 MED ORDER — MIDAZOLAM HCL 2 MG/2ML IJ SOLN
INTRAMUSCULAR | Status: AC
  Filled 2013-04-03: qty 4

## 2013-04-03 MED ORDER — FENTANYL CITRATE 0.05 MG/ML IJ SOLN
INTRAMUSCULAR | Status: AC
  Filled 2013-04-03: qty 6

## 2013-04-03 MED ORDER — LIDOCAINE HCL 1 % IJ SOLN
INTRAMUSCULAR | Status: AC
  Filled 2013-04-03: qty 20

## 2013-04-03 MED ORDER — HEPARIN SOD (PORK) LOCK FLUSH 100 UNIT/ML IV SOLN: 500.0000 [IU] | Freq: Once | INTRAVENOUS | Status: DC

## 2013-04-03 MED ORDER — MIDAZOLAM HCL 2 MG/2ML IJ SOLN
INTRAMUSCULAR | Status: AC | PRN
  Administered 2013-04-03 (×2): 2 mg via INTRAVENOUS
  Administered 2013-04-03: 1 mg via INTRAVENOUS

## 2013-04-03 MED ORDER — SODIUM CHLORIDE 0.9 % IV SOLN
Freq: Once | INTRAVENOUS | Status: AC
  Administered 2013-04-03: 13:00:00 via INTRAVENOUS

## 2013-04-03 MED ORDER — HEPARIN SOD (PORK) LOCK FLUSH 100 UNIT/ML IV SOLN
INTRAVENOUS | Status: AC | PRN
  Administered 2013-04-03: 500 [IU]

## 2013-04-03 MED ORDER — CEFAZOLIN SODIUM-DEXTROSE 2-3 GM-% IV SOLR
2.0000 g | Freq: Once | INTRAVENOUS | Status: AC
  Administered 2013-04-03: 2 g via INTRAVENOUS
  Filled 2013-04-03: qty 50

## 2013-04-03 MED ORDER — FENTANYL CITRATE 0.05 MG/ML IJ SOLN
INTRAMUSCULAR | Status: AC | PRN
  Administered 2013-04-03: 100 ug via INTRAVENOUS

## 2013-04-03 MED ORDER — FENTANYL CITRATE 0.05 MG/ML IJ SOLN
INTRAMUSCULAR | Status: AC
  Filled 2013-04-03: qty 4

## 2013-04-03 NOTE — H&P (Signed)
Agree 

## 2013-04-03 NOTE — Telephone Encounter (Signed)
Caitlyn Higgins was called by short stay nurse to see where she was as her PAC placement was at 1030 today.  Pt. Stated that she was confused about her appointments. Told Short Stay that Pac placement a priority over the follow up visit with Dr. Marko Plume this afternoon.  She was rescheduled for PAC placement at 2 pm today. CBC/diff, b-met, and  PT reviewed by Dr. Marko Plume.   Caitlyn Higgins can receive her treatment tomorrow as scheduled. A referral was made to the coumadin clinic tomorrow while patient here for treatment.  She will begin coumadin and continue with Lovenox 150 mg inj. until therapeutic.  [name] asked to provide samples of lovenox and coumadin for patient. Caitlyn Higgins will keep appt. On 04-10-13 with Dr. Marko Plume. Faxed short term disability form to [employer] with attatchments per Dr. Peggye Fothergill.  Sent a copy To [employer] to be scanned into patient's EMR as well as a copy of her completed FMLA form. The FMLA Form will be given to [name] at her visit 04-10-13.

## 2013-04-03 NOTE — H&P (Signed)
Caitlyn Higgins is an 47 y.o. female.   Chief Complaint: "I'm here for a port a cath" HPI: Patient with history of advanced uterine carcinosarcoma presents today for port a cath placement for chemotherapy.  Past Medical History  Diagnosis Date  . Reflux   . Hypertension   . Cancer     uterine carcinosarcoma    Past Surgical History  Procedure Laterality Date  . No past surgeries      Family History  Problem Relation Age of Onset  . Diabetes Mother   . Hypertension Mother   . Hypertension Father   . Diabetes Father   . Colon cancer Father   . Breast cancer Maternal Aunt    Social History:  reports that she has never smoked. She does not have any smokeless tobacco history on file. She reports that she does not drink alcohol or use illicit drugs.  Allergies: No Known Allergies  Current outpatient prescriptions:allopurinol (ZYLOPRIM) 100 MG tablet, Take 100 mg by mouth daily with breakfast., Disp: , Rfl: ;  benzonatate (TESSALON) 100 MG capsule, Take 100 mg by mouth 3 (three) times daily as needed for cough. , Disp: , Rfl: ;  cetirizine (ZYRTEC) 10 MG tablet, Take 10 mg by mouth every evening. , Disp: , Rfl:  dexamethasone (DECADRON) 4 MG tablet, Take 20 mg by mouth as directed. Take 5 tabs 12 hr and 6 hr prior to taxol chemotherapy with food., Disp: , Rfl: ;  docusate sodium (COLACE) 100 MG capsule, Take 100 mg by mouth daily., Disp: , Rfl: ;  ENDOCET 5-325 MG per tablet, Take 1-2 tablets by mouth every 6 (six) hours as needed for moderate pain or severe pain. , Disp: , Rfl:  enoxaparin (LOVENOX) 100 MG/ML injection, Inject 150 mg into the skin daily. , Disp: , Rfl: ;  esomeprazole (NEXIUM) 40 MG capsule, Take 40 mg by mouth daily before breakfast., Disp: , Rfl: ;  ferrous fumarate (HEMOCYTE - 106 MG FE) 325 (106 FE) MG TABS tablet, Take 1 tablet by mouth daily., Disp: , Rfl: ;  lidocaine-prilocaine (EMLA) cream, Apply 1 application topically as needed (port)., Disp: , Rfl:   loratadine (CLARITIN) 10 MG tablet, Take 10 mg by mouth daily., Disp: , Rfl: ;  LORazepam (ATIVAN) 0.5 MG tablet, Take 0.5 mg by mouth every 6 (six) hours as needed (nausea). Place 1-2 tablets under the tongue or swallow every 4-6 hours as needed for nausea.  Will make drowsy, Disp: , Rfl: ;  ondansetron (ZOFRAN-ODT) 4 MG disintegrating tablet, 4-8 mg every 8 (eight) hours as needed for nausea or vomiting. , Disp: , Rfl:  OxyCODONE (OXYCONTIN) 15 mg T12A 12 hr tablet, Take 15 mg by mouth every 12 (twelve) hours., Disp: , Rfl: ;  Oxycodone HCl 10 MG TABS, Take 10 mg by mouth daily as needed (pain)., Disp: , Rfl:  Current facility-administered medications:fentaNYL (SUBLIMAZE) 0.05 MG/ML injection, , , , ;  midazolam (VERSED) 2 MG/2ML injection, , , ,  Facility-Administered Medications Ordered in Other Encounters: ceFAZolin (ANCEF) IVPB 2 g/50 mL premix, 2 g, Intravenous, Once, Hedy Jacob, PA-C   Results for orders placed during the hospital encounter of 04/03/13 (from the past 48 hour(s))  APTT     Status: None   Collection Time    04/03/13 12:30 PM      Result Value Range   aPTT 35  24 - 37 seconds  BASIC METABOLIC PANEL     Status: Abnormal   Collection Time  04/03/13 12:30 PM      Result Value Range   Sodium 139  137 - 147 mEq/L   Potassium 4.0  3.7 - 5.3 mEq/L   Chloride 98  96 - 112 mEq/L   CO2 25  19 - 32 mEq/L   Glucose, Bld 98  70 - 99 mg/dL   BUN 21  6 - 23 mg/dL   Creatinine, Ser 1.33 (*) 0.50 - 1.10 mg/dL   Calcium 9.7  8.4 - 10.5 mg/dL   GFR calc non Af Amer 46 (*) >90 mL/min   GFR calc Af Amer 53 (*) >90 mL/min   Comment: (NOTE)     The eGFR has been calculated using the CKD EPI equation.     This calculation has not been validated in all clinical situations.     eGFR's persistently <90 mL/min signify possible Chronic Kidney     Disease.  CBC     Status: Abnormal   Collection Time    04/03/13 12:30 PM      Result Value Range   WBC 7.1  4.0 - 10.5 K/uL   RBC  3.21 (*) 3.87 - 5.11 MIL/uL   Hemoglobin 9.2 (*) 12.0 - 15.0 g/dL   HCT 28.4 (*) 36.0 - 46.0 %   MCV 88.5  78.0 - 100.0 fL   MCH 28.7  26.0 - 34.0 pg   MCHC 32.4  30.0 - 36.0 g/dL   RDW 18.2 (*) 11.5 - 15.5 %   Platelets 298  150 - 400 K/uL  PROTIME-INR     Status: None   Collection Time    04/03/13 12:30 PM      Result Value Range   Prothrombin Time 13.3  11.6 - 15.2 seconds   INR 1.03  0.00 - 1.49   No results found.  Review of Systems  Constitutional: Negative for fever and chills.  Respiratory: Negative for cough.   Cardiovascular: Negative for chest pain.  Gastrointestinal: Negative for nausea, vomiting and blood in stool.       Occ abd pain  Genitourinary: Negative for dysuria and hematuria.  Musculoskeletal: Positive for back pain.  Neurological: Negative for headaches.  Endo/Heme/Allergies:       Prior hx of vaginal bleeding   Vitals: BP 113/72  HR 114  R 18  O2 SATS 92% RA  TEMP 97 Last menstrual period 02/21/2013. Physical Exam  Constitutional: She is oriented to person, place, and time. She appears well-developed and well-nourished.  Cardiovascular: Regular rhythm.   tachy  Respiratory: Effort normal.  Sl dim BS rt base, left clear  GI: Soft. Bowel sounds are normal. There is no tenderness.  Musculoskeletal: Normal range of motion. She exhibits edema.  Neurological: She is alert and oriented to person, place, and time.     Assessment/Plan Patient with history of advanced uterine carcinosarcoma presents today for port a cath placement for chemotherapy. Details/risks of procedure d/w pt/sister with their understanding and consent.   Caitlyn Higgins,D KEVIN 04/03/2013, 1:23 PM

## 2013-04-03 NOTE — Discharge Instructions (Signed)
Implanted Port Home Guide °An implanted port is a type of central line that is placed under the skin. Central lines are used to provide IV access when treatment or nutrition needs to be given through a person's veins. Implanted ports are used for long-term IV access. An implanted port may be placed because:  °· You need IV medicine that would be irritating to the small veins in your hands or arms.   °· You need long-term IV medicines, such as antibiotics.   °· You need IV nutrition for a long period.   °· You need frequent blood draws for lab tests.   °· You need dialysis.   °Implanted ports are usually placed in the chest area, but they can also be placed in the upper arm, the abdomen, or the leg. An implanted port has two main parts:  °· Reservoir. The reservoir is round and will appear as a small, raised area under your skin. The reservoir is the part where a needle is inserted to give medicines or draw blood.   °· Catheter. The catheter is a thin, flexible tube that extends from the reservoir. The catheter is placed into a large vein. Medicine that is inserted into the reservoir goes into the catheter and then into the vein.   °HOW WILL I CARE FOR MY INCISION SITE? °Do not get the incision site wet. Bathe or shower as directed by your health care provider.  °HOW IS MY PORT ACCESSED? °Special steps must be taken to access the port:  °· Before the port is accessed, a numbing cream can be placed on the skin. This helps numb the skin over the port site.   °· Your health care provider uses a sterile technique to access the port. °· Your health care provider must put on a mask and sterile gloves. °· The skin over your port is cleaned carefully with an antiseptic and allowed to dry. °· The port is gently pinched between sterile gloves, and a needle is inserted into the port. °· Only "non-coring" port needles should be used to access the port. Once the port is accessed, a blood return should be checked. This helps  ensure that the port is in the vein and is not clogged.   °· If your port needs to remain accessed for a constant infusion, a clear (transparent) bandage will be placed over the needle site. The bandage and needle will need to be changed every week, or as directed by your health care provider.   °· Keep the bandage covering the needle clean and dry. Do not get it wet. Follow your health care provider's instructions on how to take a shower or bath while the port is accessed.   °· If your port does not need to stay accessed, no bandage is needed over the port.   °WHAT IS FLUSHING? °Flushing helps keep the port from getting clogged. Follow your health care provider's instructions on how and when to flush the port. Ports are usually flushed with saline solution or a medicine called heparin. The need for flushing will depend on how the port is used.  °· If the port is used for intermittent medicines or blood draws, the port will need to be flushed:   °· After medicines have been given.   °· After blood has been drawn.   °· As part of routine maintenance.   °· If a constant infusion is running, the port may not need to be flushed.   °HOW LONG WILL MY PORT STAY IMPLANTED? °The port can stay in for as long as your health care   provider thinks it is needed. When it is time for the port to come out, surgery will be done to remove it. The procedure is similar to the one performed when the port was put in.  °WHEN SHOULD I SEEK IMMEDIATE MEDICAL CARE? °When you have an implanted port, you should seek immediate medical care if:  °· You notice a bad smell coming from the incision site.   °· You have swelling, redness, or drainage at the incision site.   °· You have more swelling or pain at the port site or the surrounding area.   °· You have a fever that is not controlled with medicine. °Document Released: 03/09/2005 Document Revised: 12/28/2012 Document Reviewed: 11/14/2012 °Exit[employer]® Patient Information ©2014 [employer]. °Moderate Sedation, Adult °Moderate sedation is given to help you relax or even sleep through a procedure. You may remain sleepy, be clumsy, or have poor balance for several hours following this procedure. Arrange for a responsible adult, family member, or friend to take you home. A responsible adult should stay with you for at least 24 hours or until the medicines have worn off. °· Do not participate in any activities where you could become injured for the next 24 hours, or until you feel normal again. Do not: °· Drive. °· Swim. °· Ride a bicycle. °· Operate heavy machinery. °· Cook. °· Use power tools. °· Climb ladders. °· Work at heights. °· Do not make important decisions or sign legal documents until you are improved. °· Vomiting may occur if you eat too soon. When you can drink without vomiting, try water, juice, or soup. Try solid foods if you feel little or no nausea. °· Only take over-the-counter or prescription medications for pain, discomfort, or fever as directed by your caregiver.If pain medications have been prescribed for you, ask your caregiver how soon it is safe to take them. °· Make sure you and your family fully understands everything about the medication given to you. Make sure you understand what side effects may occur. °· You should not drink alcohol, take sleeping pills, or medications that cause drowsiness for at least 24 hours. °· If you smoke, do not smoke alone. °· If you are feeling better, you may resume normal activities 24 hours after receiving sedation. °· Keep all appointments as scheduled. Follow all instructions. °· Ask questions if you do not understand. °SEEK MEDICAL CARE IF:  °· Your skin is pale or bluish in color. °· You continue to feel sick to your stomach (nauseous) or throw up (vomit). °· Your pain is getting worse and not helped by medication. °· You have bleeding or swelling. °· You are still sleepy or feeling clumsy after 24 hours. °SEEK IMMEDIATE MEDICAL CARE IF:   °· You develop a rash. °· You have difficulty breathing. °· You develop any type of allergic problem. °· You have a fever. °Document Released: 12/02/2000 Document Revised: 06/01/2011 Document Reviewed: 11/14/2012 °ExitCare® Patient Information ©2014 [employer]. ° °

## 2013-04-03 NOTE — Telephone Encounter (Signed)
Message copied by Baruch Merl on Mon Apr 03, 2013  5:21 PM ------      Message from: Gordy Levan      Created: Sat Apr 01, 2013  5:52 PM       Will start coumadin after 1-12 visit. It would be great if we can give samples when I see her on 1-12, probably 5 mg tablets to start. She will need at least 5-7 days of lovenox to overlap start of coumadin, so may need more samples that day also.      Thanks      Cc LA, TH, Eliezer Bottom ------

## 2013-04-03 NOTE — Procedures (Signed)
Procedure:  Right IJ portacath placement Findings:  Catheter tip is at cavoatrial junction.  OK to use.  No PTX.

## 2013-04-04 ENCOUNTER — Ambulatory Visit (HOSPITAL_BASED_OUTPATIENT_CLINIC_OR_DEPARTMENT_OTHER): Payer: BC Managed Care – PPO

## 2013-04-04 ENCOUNTER — Ambulatory Visit: Payer: No Typology Code available for payment source | Admitting: Pharmacist

## 2013-04-04 ENCOUNTER — Ambulatory Visit: Payer: No Typology Code available for payment source | Admitting: Nutrition

## 2013-04-04 DIAGNOSIS — C549 Malignant neoplasm of corpus uteri, unspecified: Secondary | ICD-10-CM

## 2013-04-04 DIAGNOSIS — C541 Malignant neoplasm of endometrium: Secondary | ICD-10-CM

## 2013-04-04 DIAGNOSIS — Z5111 Encounter for antineoplastic chemotherapy: Secondary | ICD-10-CM

## 2013-04-04 MED ORDER — SODIUM CHLORIDE 0.9 % IV SOLN
Freq: Once | INTRAVENOUS | Status: AC
  Administered 2013-04-04: 13:00:00 via INTRAVENOUS

## 2013-04-04 MED ORDER — DIPHENHYDRAMINE HCL 50 MG/ML IJ SOLN
INTRAMUSCULAR | Status: AC
  Filled 2013-04-04: qty 1

## 2013-04-04 MED ORDER — FAMOTIDINE IN NACL 20-0.9 MG/50ML-% IV SOLN
20.0000 mg | Freq: Once | INTRAVENOUS | Status: AC
  Administered 2013-04-04: 20 mg via INTRAVENOUS

## 2013-04-04 MED ORDER — HEPARIN SOD (PORK) LOCK FLUSH 100 UNIT/ML IV SOLN
500.0000 [IU] | Freq: Once | INTRAVENOUS | Status: AC | PRN
  Administered 2013-04-04: 500 [IU]
  Filled 2013-04-04: qty 5

## 2013-04-04 MED ORDER — DEXAMETHASONE SODIUM PHOSPHATE 20 MG/5ML IJ SOLN
INTRAMUSCULAR | Status: AC
  Filled 2013-04-04: qty 5

## 2013-04-04 MED ORDER — PACLITAXEL CHEMO INJECTION 300 MG/50ML
175.0000 mg/m2 | Freq: Once | INTRAVENOUS | Status: AC
  Administered 2013-04-04: 390 mg via INTRAVENOUS
  Filled 2013-04-04: qty 65

## 2013-04-04 MED ORDER — ONDANSETRON 16 MG/50ML IVPB (CHCC)
INTRAVENOUS | Status: AC
  Filled 2013-04-04: qty 16

## 2013-04-04 MED ORDER — SODIUM CHLORIDE 0.9 % IJ SOLN
10.0000 mL | INTRAMUSCULAR | Status: DC | PRN
  Administered 2013-04-04: 10 mL
  Filled 2013-04-04: qty 10

## 2013-04-04 MED ORDER — DIPHENHYDRAMINE HCL 50 MG/ML IJ SOLN
50.0000 mg | Freq: Once | INTRAMUSCULAR | Status: AC
  Administered 2013-04-04: 50 mg via INTRAVENOUS

## 2013-04-04 MED ORDER — FAMOTIDINE IN NACL 20-0.9 MG/50ML-% IV SOLN
INTRAVENOUS | Status: AC
  Filled 2013-04-04: qty 50

## 2013-04-04 MED ORDER — SODIUM CHLORIDE 0.9 % IV SOLN
527.5000 mg | Freq: Once | INTRAVENOUS | Status: AC
  Administered 2013-04-04: 530 mg via INTRAVENOUS
  Filled 2013-04-04: qty 53

## 2013-04-04 MED ORDER — DEXAMETHASONE SODIUM PHOSPHATE 20 MG/5ML IJ SOLN
20.0000 mg | Freq: Once | INTRAMUSCULAR | Status: AC
Start: 2013-04-04 — End: 2013-04-04
  Administered 2013-04-04: 20 mg via INTRAVENOUS

## 2013-04-04 MED ORDER — ONDANSETRON 16 MG/50ML IVPB (CHCC)
16.0000 mg | Freq: Once | INTRAVENOUS | Status: AC
  Administered 2013-04-04: 16 mg via INTRAVENOUS

## 2013-04-04 NOTE — Progress Notes (Signed)
Followup completed for patient with endometrial cancer receiving chemotherapy.  Patient's weight was documented as 220.6 pounds January 7 down just slightly from 222 pounds December 30.  Patient reports nausea has improved.  She continues to drink oral nutrition supplements and states she is now tolerating these without difficulty.  Patient has no nutrition concerns today.  Nutrition diagnosis: Inadequate oral intake has improved.  Intervention: Patient educated to continue medications as prescribed by physician.  She should continue adequate calories and protein in 6 small, frequent meals or snacks daily.  She will continue oral nutrition supplements as tolerated for weight maintenance.  Questions were answered.  Teach back method used.  Monitoring, evaluation, goals: Patient will continue to tolerate adequate calories and protein to minimize weight loss.  Next visit: Patient will contact me for her followup as needed.

## 2013-04-04 NOTE — Progress Notes (Signed)
INR = 1 since Caitlyn Higgins has not started Coumadin yet.  Coumadin eduction done including indication, INR testing, s/s bleeding, DI (Rx/OTC/herbal) VitK containing foods and alcohol.  Caitlyn Higgins had already received benadryl as a premed so she was somewhat sleep when I spoke to her.  Coumadin edu should be reviewed at next coumadin clinic appt.  Caitlyn Higgins will continue Lovenox 150mg  daily and start coumadin 5mg  daily today.  Will check PT/INR on 04/10/13.    Coumadin 5mg  #10 samples given: Lot [id number] ExpJun/2016.

## 2013-04-04 NOTE — Patient Instructions (Signed)
[facility] Discharge Instructions for Patients Receiving Chemotherapy  Today you received the following chemotherapy agents Taxol/Carboplatin.   To help prevent nausea and vomiting after your treatment, we encourage you to take your nausea medication as prescribed,    If you develop nausea and vomiting that is not controlled by your nausea medication, call the clinic.   BELOW ARE SYMPTOMS THAT SHOULD BE REPORTED IMMEDIATELY:  *FEVER GREATER THAN 100.5 F  *CHILLS WITH OR WITHOUT FEVER  NAUSEA AND VOMITING THAT IS NOT CONTROLLED WITH YOUR NAUSEA MEDICATION  *UNUSUAL SHORTNESS OF BREATH  *UNUSUAL BRUISING OR BLEEDING  TENDERNESS IN MOUTH AND THROAT WITH OR WITHOUT PRESENCE OF ULCERS  *URINARY PROBLEMS  *BOWEL PROBLEMS  UNUSUAL RASH Items with * indicate a potential emergency and should be followed up as soon as possible.  Feel free to call the clinic you have any questions or concerns. The clinic phone number is [phone].  It was my pleasure to take care of you today!  Leeanne Rio, RN

## 2013-04-05 LAB — PROTEIN ELECTROPH W RFLX QUANT IMMUNOGLOBULINS
Albumin ELP: 41.2 % — ABNORMAL LOW (ref 55.8–66.1)
Alpha-1-Globulin: 5.3 % — ABNORMAL HIGH (ref 2.9–4.9)
Alpha-2-Globulin: 9.5 % (ref 7.1–11.8)
Beta 2: 6.9 % — ABNORMAL HIGH (ref 3.2–6.5)
Beta Globulin: 5.3 % (ref 4.7–7.2)
Gamma Globulin: 31.8 % — ABNORMAL HIGH (ref 11.1–18.8)
M-SPIKE, %: NOT DETECTED g/dL
TOTAL PROTEIN ELP: 7.9 g/dL (ref 6.0–8.3)

## 2013-04-06 ENCOUNTER — Other Ambulatory Visit (HOSPITAL_COMMUNITY): Payer: BC Managed Care – PPO

## 2013-04-09 ENCOUNTER — Other Ambulatory Visit: Payer: Self-pay | Admitting: Oncology

## 2013-04-10 ENCOUNTER — Ambulatory Visit (HOSPITAL_BASED_OUTPATIENT_CLINIC_OR_DEPARTMENT_OTHER): Payer: BC Managed Care – PPO | Admitting: Pharmacist

## 2013-04-10 ENCOUNTER — Other Ambulatory Visit: Payer: Self-pay

## 2013-04-10 ENCOUNTER — Encounter: Payer: Self-pay | Admitting: Oncology

## 2013-04-10 ENCOUNTER — Telehealth: Payer: Self-pay | Admitting: Oncology

## 2013-04-10 ENCOUNTER — Ambulatory Visit (HOSPITAL_BASED_OUTPATIENT_CLINIC_OR_DEPARTMENT_OTHER): Payer: BC Managed Care – PPO | Admitting: Oncology

## 2013-04-10 ENCOUNTER — Other Ambulatory Visit (HOSPITAL_BASED_OUTPATIENT_CLINIC_OR_DEPARTMENT_OTHER): Payer: BC Managed Care – PPO

## 2013-04-10 ENCOUNTER — Encounter (INDEPENDENT_AMBULATORY_CARE_PROVIDER_SITE_OTHER): Payer: Self-pay

## 2013-04-10 VITALS — BP 101/68 | HR 130 | Temp 97.5°F | Resp 18 | Ht 67.91 in | Wt 210.7 lb

## 2013-04-10 DIAGNOSIS — I82401 Acute embolism and thrombosis of unspecified deep veins of right lower extremity: Secondary | ICD-10-CM

## 2013-04-10 DIAGNOSIS — I82409 Acute embolism and thrombosis of unspecified deep veins of unspecified lower extremity: Secondary | ICD-10-CM

## 2013-04-10 DIAGNOSIS — C541 Malignant neoplasm of endometrium: Secondary | ICD-10-CM

## 2013-04-10 DIAGNOSIS — R7989 Other specified abnormal findings of blood chemistry: Secondary | ICD-10-CM

## 2013-04-10 DIAGNOSIS — E43 Unspecified severe protein-calorie malnutrition: Secondary | ICD-10-CM

## 2013-04-10 DIAGNOSIS — C549 Malignant neoplasm of corpus uteri, unspecified: Secondary | ICD-10-CM

## 2013-04-10 DIAGNOSIS — D649 Anemia, unspecified: Secondary | ICD-10-CM

## 2013-04-10 DIAGNOSIS — T451X5A Adverse effect of antineoplastic and immunosuppressive drugs, initial encounter: Secondary | ICD-10-CM

## 2013-04-10 DIAGNOSIS — N289 Disorder of kidney and ureter, unspecified: Secondary | ICD-10-CM

## 2013-04-10 DIAGNOSIS — D6481 Anemia due to antineoplastic chemotherapy: Secondary | ICD-10-CM

## 2013-04-10 LAB — COMPREHENSIVE METABOLIC PANEL (CC13)
ALBUMIN: 3.7 g/dL (ref 3.5–5.0)
ALK PHOS: 50 U/L (ref 40–150)
ALT: 12 U/L (ref 0–55)
AST: 32 U/L (ref 5–34)
Anion Gap: 12 mEq/L — ABNORMAL HIGH (ref 3–11)
BUN: 29.7 mg/dL — AB (ref 7.0–26.0)
CALCIUM: 10 mg/dL (ref 8.4–10.4)
CHLORIDE: 101 meq/L (ref 98–109)
CO2: 24 mEq/L (ref 22–29)
Creatinine: 1.3 mg/dL — ABNORMAL HIGH (ref 0.6–1.1)
Glucose: 102 mg/dl (ref 70–140)
POTASSIUM: 3.7 meq/L (ref 3.5–5.1)
SODIUM: 137 meq/L (ref 136–145)
TOTAL PROTEIN: 9.2 g/dL — AB (ref 6.4–8.3)
Total Bilirubin: 0.68 mg/dL (ref 0.20–1.20)

## 2013-04-10 LAB — CBC WITH DIFFERENTIAL/PLATELET
BASO%: 0.9 % (ref 0.0–2.0)
BASOS ABS: 0 10*3/uL (ref 0.0–0.1)
EOS ABS: 0.1 10*3/uL (ref 0.0–0.5)
EOS%: 2.3 % (ref 0.0–7.0)
HCT: 24.8 % — ABNORMAL LOW (ref 34.8–46.6)
HEMOGLOBIN: 8.3 g/dL — AB (ref 11.6–15.9)
LYMPH%: 22.3 % (ref 14.0–49.7)
MCH: 29.4 pg (ref 25.1–34.0)
MCHC: 33.5 g/dL (ref 31.5–36.0)
MCV: 87.6 fL (ref 79.5–101.0)
MONO#: 0.1 10*3/uL (ref 0.1–0.9)
MONO%: 2.1 % (ref 0.0–14.0)
NEUT%: 72.4 % (ref 38.4–76.8)
NEUTROS ABS: 2.6 10*3/uL (ref 1.5–6.5)
Platelets: 225 10*3/uL (ref 145–400)
RBC: 2.82 10*6/uL — AB (ref 3.70–5.45)
RDW: 18.3 % — AB (ref 11.2–14.5)
WBC: 3.5 10*3/uL — AB (ref 3.9–10.3)
lymph#: 0.8 10*3/uL — ABNORMAL LOW (ref 0.9–3.3)

## 2013-04-10 LAB — PROTIME-INR
INR: 1.5 — ABNORMAL LOW (ref 2.00–3.50)
PROTIME: 18 s — AB (ref 10.6–13.4)

## 2013-04-10 LAB — POCT INR: INR: 1.5

## 2013-04-10 MED ORDER — ENOXAPARIN SODIUM 150 MG/ML ~~LOC~~ SOLN
150.0000 mg | Freq: Once | SUBCUTANEOUS | Status: AC
Start: 2013-04-10 — End: 2013-04-10
  Administered 2013-04-10: 150 mg via SUBCUTANEOUS
  Filled 2013-04-10: qty 1

## 2013-04-10 MED ORDER — OXYCODONE HCL 10 MG PO TABS: ORAL_TABLET | ORAL | Status: DC

## 2013-04-10 MED ORDER — LORAZEPAM 0.5 MG PO TABS: ORAL_TABLET | ORAL | Status: DC

## 2013-04-10 MED ORDER — DEXAMETHASONE 4 MG PO TABS: 20.0000 mg | ORAL_TABLET | ORAL | Status: DC

## 2013-04-10 NOTE — Progress Notes (Signed)
Pt seen by MD today. Hg/Hct: 8.3/24.8 Pltc: 225 Coumadin instructions as below per Dr. Marko Plume.  Continue Lovenox 150mg  daily.  Take Coumadin 7.5mg  today, 5mg  on 1/20, 7.5mg  on 1/21 and 5mg  on 1/22.  Recheck INR on 1/23; lab at 10:15am and Coumadin clinic at 10:30am. No charge coumadin clinic encounter.

## 2013-04-10 NOTE — Patient Instructions (Signed)
Continue Lovenox 150mg  daily.  Take Coumadin 7.5mg  today, 5mg  on 1/20, 7.5mg  on 1/21 and 5mg  on 1/22.  Recheck INR on 1/23; lab at 10:15am and Coumadin clinic at 10:30am.

## 2013-04-10 NOTE — Telephone Encounter (Signed)
, °

## 2013-04-10 NOTE — Patient Instructions (Signed)
DRINK LOTS OF FLUIDS TODAY and rest of this week  Call if you feel weak or lightheaded in spite of the extra fluids, as you may need another blood transfusion  COUMADIN AND LOVENOX Take one and a half of the 5 mg coumadin tablets today, then  One tablet (=5 mg) on Tues Jan 20, then One and a half (=7.5 mg) on Wed Jan 21, then  One tablet (=5mg ) on Thurs Jan 22 Continue lovenox daily thru Thurs Jan 22  We will recheck the coumadin on Friday Jan 23 and let you know next directions then.

## 2013-04-10 NOTE — Telephone Encounter (Signed)
Prescription printed for a refill on Oxycodone 10 mg tabs.  Patient did not need refill at this time so no prescription given to patient. Last refill actually for 40 tabs 03-28-13.
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
## 2013-04-12 ENCOUNTER — Telehealth: Payer: Self-pay

## 2013-04-12 NOTE — Telephone Encounter (Signed)
Caitlyn Higgins that [name] began her taxol/carboplatin chemotherapy on 03-14-13. The treatment is every three weeks. Treatment is neoadjuvant so she will probable receive ~4 cycles and then have scans to determine if tumor can be debulked.   She would have surgery if indicated and then more chemotherapy.

## 2013-04-13 ENCOUNTER — Other Ambulatory Visit: Payer: Self-pay | Admitting: Oncology

## 2013-04-14 ENCOUNTER — Other Ambulatory Visit (HOSPITAL_BASED_OUTPATIENT_CLINIC_OR_DEPARTMENT_OTHER): Payer: BC Managed Care – PPO

## 2013-04-14 ENCOUNTER — Other Ambulatory Visit: Payer: Self-pay | Admitting: Oncology

## 2013-04-14 ENCOUNTER — Ambulatory Visit (HOSPITAL_BASED_OUTPATIENT_CLINIC_OR_DEPARTMENT_OTHER): Payer: BC Managed Care – PPO

## 2013-04-14 ENCOUNTER — Ambulatory Visit (HOSPITAL_COMMUNITY)
Admission: RE | Admit: 2013-04-14 | Discharge: 2013-04-14 | Disposition: A | Discharge status: Home / Self Care | Payer: BC Managed Care – PPO | Source: Ambulatory Visit | Attending: Oncology | Admitting: Oncology

## 2013-04-14 ENCOUNTER — Ambulatory Visit: Payer: No Typology Code available for payment source | Admitting: Pharmacist

## 2013-04-14 VITALS — BP 90/50 | HR 89 | Temp 97.4°F | Resp 17

## 2013-04-14 DIAGNOSIS — D500 Iron deficiency anemia secondary to blood loss (chronic): Secondary | ICD-10-CM

## 2013-04-14 DIAGNOSIS — I82409 Acute embolism and thrombosis of unspecified deep veins of unspecified lower extremity: Secondary | ICD-10-CM

## 2013-04-14 DIAGNOSIS — Z5189 Encounter for other specified aftercare: Secondary | ICD-10-CM

## 2013-04-14 DIAGNOSIS — C541 Malignant neoplasm of endometrium: Secondary | ICD-10-CM

## 2013-04-14 DIAGNOSIS — C549 Malignant neoplasm of corpus uteri, unspecified: Secondary | ICD-10-CM

## 2013-04-14 LAB — CBC WITH DIFFERENTIAL/PLATELET
BASO%: 0.7 % (ref 0.0–2.0)
Basophils Absolute: 0 10*3/uL (ref 0.0–0.1)
EOS%: 0.5 % (ref 0.0–7.0)
Eosinophils Absolute: 0 10*3/uL (ref 0.0–0.5)
HEMATOCRIT: 22.6 % — AB (ref 34.8–46.6)
HGB: 7.5 g/dL — ABNORMAL LOW (ref 11.6–15.9)
LYMPH%: 32.1 % (ref 14.0–49.7)
MCH: 29 pg (ref 25.1–34.0)
MCHC: 32.9 g/dL (ref 31.5–36.0)
MCV: 88 fL (ref 79.5–101.0)
MONO#: 0.3 10*3/uL (ref 0.1–0.9)
MONO%: 14.1 % — AB (ref 0.0–14.0)
NEUT#: 1.1 10*3/uL — ABNORMAL LOW (ref 1.5–6.5)
NEUT%: 52.6 % (ref 38.4–76.8)
PLATELETS: 131 10*3/uL — AB (ref 145–400)
RBC: 2.57 10*6/uL — AB (ref 3.70–5.45)
RDW: 18.5 % — ABNORMAL HIGH (ref 11.2–14.5)
WBC: 2.2 10*3/uL — ABNORMAL LOW (ref 3.9–10.3)
lymph#: 0.7 10*3/uL — ABNORMAL LOW (ref 0.9–3.3)

## 2013-04-14 LAB — PROTIME-INR
INR: 1.8 — ABNORMAL LOW (ref 2.00–3.50)
PROTIME: 21.6 s — AB (ref 10.6–13.4)

## 2013-04-14 LAB — PREPARE RBC (CROSSMATCH)

## 2013-04-14 LAB — POCT INR: INR: 1.8

## 2013-04-14 MED ORDER — DIPHENHYDRAMINE HCL 25 MG PO CAPS
25.0000 mg | ORAL_CAPSULE | Freq: Once | ORAL | Status: AC
  Administered 2013-04-14: 25 mg via ORAL

## 2013-04-14 MED ORDER — PEGFILGRASTIM INJECTION 6 MG/0.6ML
6.0000 mg | Freq: Once | SUBCUTANEOUS | Status: AC
  Administered 2013-04-14: 6 mg via SUBCUTANEOUS
  Filled 2013-04-14: qty 0.6

## 2013-04-14 MED ORDER — ENOXAPARIN SODIUM 150 MG/ML ~~LOC~~ SOLN
150.0000 mg | Freq: Once | SUBCUTANEOUS | Status: AC
  Administered 2013-04-14: 150 mg via SUBCUTANEOUS
  Filled 2013-04-14: qty 1

## 2013-04-14 MED ORDER — ACETAMINOPHEN 325 MG PO TABS
ORAL_TABLET | ORAL | Status: AC
  Filled 2013-04-14: qty 2

## 2013-04-14 MED ORDER — SODIUM CHLORIDE 0.9 % IJ SOLN
10.0000 mL | INTRAMUSCULAR | Status: AC | PRN
  Administered 2013-04-14: 10 mL
  Filled 2013-04-14: qty 10

## 2013-04-14 MED ORDER — DIPHENHYDRAMINE HCL 25 MG PO CAPS
ORAL_CAPSULE | ORAL | Status: AC
  Filled 2013-04-14: qty 1

## 2013-04-14 MED ORDER — ACETAMINOPHEN 325 MG PO TABS
650.0000 mg | ORAL_TABLET | Freq: Once | ORAL | Status: AC
  Administered 2013-04-14: 650 mg via ORAL

## 2013-04-14 MED ORDER — SODIUM CHLORIDE 0.9 % IV SOLN
250.0000 mL | Freq: Once | INTRAVENOUS | Status: AC
  Administered 2013-04-14: 250 mL via INTRAVENOUS

## 2013-04-14 MED ORDER — HEPARIN SOD (PORK) LOCK FLUSH 100 UNIT/ML IV SOLN
500.0000 [IU] | Freq: Every day | INTRAVENOUS | Status: AC | PRN
  Administered 2013-04-14: 500 [IU]
  Filled 2013-04-14: qty 5

## 2013-04-14 NOTE — Progress Notes (Signed)
INR increasing but still slightly below goal (at 1.8 today) Pt seen today with her sister Peter Congo, Patient is doing well but states she has been fatigued and tired, and slightly SOB on exertion. Pt states she has no issues regarding bleeding but does have some bruising at her lovenox injection sites.  CBC from today: Hgb: 7.5, plts 131k, ANC 1.1 (likely why she has been feeling tired) Spoke with Barbaraann Share, RN and Dr. Marko Plume Pt will be transfused today and receive 1 lovenox shot today in the clinic of 150 mg and then patient can stop her lovenox shots. Will make slight increase in her coumadin dose and re-evaluate next week Plan:  Take 1 more lovenox shot today 150 mg and then stop.  Increase Coumadin to 7.5mg  daily except for 5 mg on Tuesday and Thursday, and Sunday.  Recheck INR on 1/28; lab at 10:15am and Coumadin clinic at 10:30am and Dr. Marko Plume apt at 11 am (appointments scheduled already)

## 2013-04-14 NOTE — Patient Instructions (Addendum)
Blood Transfusion Information WHAT IS A BLOOD TRANSFUSION? A transfusion is the replacement of blood or some of its parts. Blood is made up of multiple cells which provide different functions.  Red blood cells carry oxygen and are used for blood loss replacement.  White blood cells fight against infection.  Platelets control bleeding.  Plasma helps clot blood.  Other blood products are available for specialized needs, such as hemophilia or other clotting disorders. BEFORE THE TRANSFUSION  Who gives blood for transfusions?   You may be able to donate blood to be used at a later date on yourself (autologous donation).  Relatives can be asked to donate blood. This is generally not any safer than if you have received blood from a stranger. The same precautions are taken to ensure safety when a relative's blood is donated.  Healthy volunteers who are fully evaluated to make sure their blood is safe. This is blood bank blood. Transfusion therapy is the safest it has ever been in the practice of medicine. Before blood is taken from a donor, a complete history is taken to make sure that person has no history of diseases nor engages in risky social behavior (examples are intravenous drug use or sexual activity with multiple partners). The donor's travel history is screened to minimize risk of transmitting infections, such as malaria. The donated blood is tested for signs of infectious diseases, such as HIV and hepatitis. The blood is then tested to be sure it is compatible with you in order to minimize the chance of a transfusion reaction. If you or a relative donates blood, this is often done in anticipation of surgery and is not appropriate for emergency situations. It takes many days to process the donated blood. RISKS AND COMPLICATIONS Although transfusion therapy is very safe and saves many lives, the main dangers of transfusion include:   Getting an infectious disease.  Developing a  transfusion reaction. This is an allergic reaction to something in the blood you were given. Every precaution is taken to prevent this. The decision to have a blood transfusion has been considered carefully by your caregiver before blood is given. Blood is not given unless the benefits outweigh the risks. AFTER THE TRANSFUSION  Right after receiving a blood transfusion, you will usually feel much better and more energetic. This is especially true if your red blood cells have gotten low (anemic). The transfusion raises the level of the red blood cells which carry oxygen, and this usually causes an energy increase.  The nurse administering the transfusion will monitor you carefully for complications. HOME CARE INSTRUCTIONS  No special instructions are needed after a transfusion. You may find your energy is better. Speak with your caregiver about any limitations on activity for underlying diseases you may have. SEEK MEDICAL CARE IF:   Your condition is not improving after your transfusion.  You develop redness or irritation at the intravenous (IV) site. SEEK IMMEDIATE MEDICAL CARE IF:  Any of the following symptoms occur over the next 12 hours:  Shaking chills.  You have a temperature by mouth above 102 F (38.9 C), not controlled by medicine.  Chest, back, or muscle pain.  People around you feel you are not acting correctly or are confused.  Shortness of breath or difficulty breathing.  Dizziness and fainting.  You get a rash or develop hives.  You have a decrease in urine output.  Your urine turns a dark color or changes to pink, red, or brown. Any of the following   symptoms occur over the next 10 days:  You have a temperature by mouth above 102 F (38.9 C), not controlled by medicine.  Shortness of breath.  Weakness after normal activity.  The white part of the eye turns yellow (jaundice).  You have a decrease in the amount of urine or are urinating less often.  Your  urine turns a dark color or changes to pink, red, or brown. Document Released: 03/06/2000 Document Revised: 06/01/2011 Document Reviewed: 10/24/2007 [facility] Patient Information 2014 [employer].  Pegfilgrastim injection (Neulasta) What is this medicine? PEGFILGRASTIM (peg fil GRA stim) helps the body make more white blood cells. It is used to prevent infection in people with low amounts of white blood cells following cancer treatment. This medicine may be used for other purposes; ask your health care provider or pharmacist if you have questions. COMMON BRAND NAME(S): Neulasta What should I tell my health care provider before I take this medicine? They need to know if you have any of these conditions: -sickle cell disease -an unusual or allergic reaction to pegfilgrastim, filgrastim, E.coli protein, other medicines, foods, dyes, or preservatives -pregnant or trying to get pregnant -breast-feeding How should I use this medicine? This medicine is for injection under the skin. It is usually given by a health care professional in a hospital or clinic setting. If you get this medicine at home, you will be taught how to prepare and give this medicine. Do not shake this medicine. Use exactly as directed. Take your medicine at regular intervals. Do not take your medicine more often than directed. It is important that you put your used needles and syringes in a special sharps container. Do not put them in a trash can. If you do not have a sharps container, call your pharmacist or healthcare provider to get one. Talk to your pediatrician regarding the use of this medicine in children. While this drug may be prescribed for children who weigh more than 45 kg for selected conditions, precautions do apply Overdosage: If you think you have taken too much of this medicine contact a poison control center or emergency room at once. NOTE: This medicine is only for you. Do not share this medicine with  others. What if I miss a dose? If you miss a dose, take it as soon as you can. If it is almost time for your next dose, take only that dose. Do not take double or extra doses. What may interact with this medicine? -lithium -medicines for growth therapy This list may not describe all possible interactions. Give your health care provider a list of all the medicines, herbs, non-prescription drugs, or dietary supplements you use. Also tell them if you smoke, drink alcohol, or use illegal drugs. Some items may interact with your medicine. What should I watch for while using this medicine? Visit your doctor for regular check ups. You will need important blood work done while you are taking this medicine. What side effects may I notice from receiving this medicine? Side effects that you should report to your doctor or health care professional as soon as possible: -allergic reactions like skin rash, itching or hives, swelling of the face, lips, or tongue -breathing problems -fever -pain, redness, or swelling where injected -shoulder pain -stomach or side pain Side effects that usually do not require medical attention (report to your doctor or health care professional if they continue or are bothersome): -aches, pains -headache -loss of appetite -nausea, vomiting -unusually tired This list may not describe all  possible side effects. Call your doctor for medical advice about side effects. You may report side effects to FDA at 1-800-FDA-1088. Where should I keep my medicine? Keep out of the reach of children. Store in a refrigerator between 2 and 8 degrees C (36 and 46 degrees F). Do not freeze. Keep in carton to protect from light. Throw away this medicine if it is left out of the refrigerator for more than 48 hours. Throw away any unused medicine after the expiration date. NOTE: This sheet is a summary. It may not cover all possible information. If you have questions about this medicine, talk to  your doctor, pharmacist, or health care provider.  2014, Elsevier/Gold Standard. (2007-10-10 15:41:44)  Enoxaparin injection (Lovenox) What is this medicine? ENOXAPARIN (ee nox a PA rin) is used after knee, hip, or abdominal surgeries to prevent blood clotting. It is also used to treat existing blood clots in the lungs or in the veins. This medicine may be used for other purposes; ask your health care provider or pharmacist if you have questions. COMMON BRAND NAME(S): Lovenox What should I tell my health care provider before I take this medicine? They need to know if you have any of these conditions: -bleeding disorders, hemorrhage, or hemophilia -infection of the heart or heart valves -kidney or liver disease -previous stroke -prosthetic heart valve -recent surgery or delivery of a baby -ulcer in the stomach or intestine, diverticulitis, or other bowel disease -an unusual or allergic reaction to enoxaparin, heparin, pork or pork products, other medicines, foods, dyes, or preservatives -pregnant or trying to get pregnant -breast-feeding How should I use this medicine? This medicine is for injection under the skin. It is usually given by a health-care professional. You or a family member may be trained on how to give the injections. If you are to give yourself injections, make sure you understand how to use the syringe, measure the dose if necessary, and give the injection. To avoid bruising, do not rub the site where this medicine has been injected. Do not take your medicine more often than directed. Do not stop taking except on the advice of your doctor or health care professional. Make sure you receive a puncture-resistant container to dispose of the needles and syringes once you have finished with them. Do not reuse these items. Return the container to your doctor or health care professional for proper disposal. Talk to your pediatrician regarding the use of this medicine in children.  Special care may be needed. Overdosage: If you think you have taken too much of this medicine contact a poison control center or emergency room at once. NOTE: This medicine is only for you. Do not share this medicine with others. What if I miss a dose? If you miss a dose, take it as soon as you can. If it is almost time for your next dose, take only that dose. Do not take double or extra doses. What may interact with this medicine? Do not take this medicine with any of the following medications: -aspirin and aspirin-like medicines -heparin -mifepristone -palifermin -warfarin  This medicine may also interact with the following medications: -cilostazol -clopidogrel -dipyridamole -NSAIDs, medicines for pain and inflammation, like ibuprofen or naproxen -sulfinpyrazone -ticlopidine This list may not describe all possible interactions. Give your health care provider a list of all the medicines, herbs, non-prescription drugs, or dietary supplements you use. Also tell them if you smoke, drink alcohol, or use illegal drugs. Some items may interact with your medicine. What  should I watch for while using this medicine? Visit your doctor or health care professional for regular checks on your progress. Your condition will be monitored carefully while you are receiving this medicine. Notify your doctor or health care professional and seek emergency treatment if you develop breathing problems; changes in vision; chest pain; severe, sudden headache; pain, swelling, warmth in the leg; trouble speaking; sudden numbness or weakness of the face, arm, or leg. These can be signs that your condition has gotten worse. If you are going to have surgery, tell your doctor or health care professional that you are taking this medicine. Do not stop taking this medicine without first talking to your doctor. Be sure to refill your prescription before you run out of medicine. Avoid sports and activities that might cause  injury while you are using this medicine. Severe falls or injuries can cause unseen bleeding. Be careful when using sharp tools or knives. Consider using an Copy. Take special care brushing or flossing your teeth. Report any injuries, bruising, or red spots on the skin to your doctor or health care professional. What side effects may I notice from receiving this medicine? Side effects that you should report to your doctor or health care professional as soon as possible: -allergic reactions like skin rash, itching or hives, swelling of the face, lips, or tongue -feeling faint or lightheaded, falls -signs and symptoms of bleeding such as bloody or black, tarry stools; red or dark-brown urine; spitting up blood or brown material that looks like coffee grounds; red spots on the skin; unusual bruising or bleeding from the eye, gums, or nose  Side effects that usually do not require medical attention (report to your doctor or health care professional if they continue or are bothersome): -pain, redness, or irritation at site where injected This list may not describe all possible side effects. Call your doctor for medical advice about side effects. You may report side effects to FDA at 1-800-FDA-1088. Where should I keep my medicine? Keep out of the reach of children. Store at room temperature between 15 and 30 degrees C (59 and 86 degrees F). Do not freeze. If your injections have been specially prepared, you may need to store them in the refrigerator. Ask your pharmacist. Throw away any unused medicine after the expiration date. NOTE: This sheet is a summary. It may not cover all possible information. If you have questions about this medicine, talk to your doctor, pharmacist, or health care provider.  2014, Elsevier/Gold Standard. (2012-07-05 16:13:24)

## 2013-04-14 NOTE — Patient Instructions (Signed)
INR increasing but still slightly below goal Take 1 more lovenox shot today 150 mg and then stop.  Then increase Coumadin 7.5mg  daily except for 5 mg on Tuesday and Thursday, and Sunday.  Recheck INR on 1/28; lab at 10:15am and Coumadin clinic at 10:30am and Dr. Marko Plume apt at 11 am

## 2013-04-16 LAB — TYPE AND SCREEN
ABO/RH(D): O POS
Antibody Screen: NEGATIVE
UNIT DIVISION: 0
Unit division: 0

## 2013-04-19 ENCOUNTER — Encounter: Payer: Self-pay | Admitting: Oncology

## 2013-04-19 ENCOUNTER — Other Ambulatory Visit (HOSPITAL_BASED_OUTPATIENT_CLINIC_OR_DEPARTMENT_OTHER): Payer: BC Managed Care – PPO

## 2013-04-19 ENCOUNTER — Ambulatory Visit (HOSPITAL_BASED_OUTPATIENT_CLINIC_OR_DEPARTMENT_OTHER): Payer: BC Managed Care – PPO | Admitting: Pharmacist

## 2013-04-19 ENCOUNTER — Telehealth: Payer: Self-pay | Admitting: *Deleted

## 2013-04-19 ENCOUNTER — Ambulatory Visit (HOSPITAL_BASED_OUTPATIENT_CLINIC_OR_DEPARTMENT_OTHER): Payer: BC Managed Care – PPO | Admitting: Oncology

## 2013-04-19 VITALS — BP 121/95 | HR 90 | Temp 97.8°F | Resp 20 | Ht 67.91 in | Wt 211.2 lb

## 2013-04-19 DIAGNOSIS — D649 Anemia, unspecified: Secondary | ICD-10-CM

## 2013-04-19 DIAGNOSIS — I10 Essential (primary) hypertension: Secondary | ICD-10-CM

## 2013-04-19 DIAGNOSIS — C55 Malignant neoplasm of uterus, part unspecified: Secondary | ICD-10-CM

## 2013-04-19 DIAGNOSIS — I82409 Acute embolism and thrombosis of unspecified deep veins of unspecified lower extremity: Secondary | ICD-10-CM

## 2013-04-19 DIAGNOSIS — G8929 Other chronic pain: Secondary | ICD-10-CM

## 2013-04-19 DIAGNOSIS — N289 Disorder of kidney and ureter, unspecified: Secondary | ICD-10-CM

## 2013-04-19 DIAGNOSIS — C541 Malignant neoplasm of endometrium: Secondary | ICD-10-CM

## 2013-04-19 DIAGNOSIS — M549 Dorsalgia, unspecified: Secondary | ICD-10-CM

## 2013-04-19 DIAGNOSIS — E43 Unspecified severe protein-calorie malnutrition: Secondary | ICD-10-CM

## 2013-04-19 DIAGNOSIS — D500 Iron deficiency anemia secondary to blood loss (chronic): Secondary | ICD-10-CM

## 2013-04-19 LAB — CBC WITH DIFFERENTIAL/PLATELET
BASO%: 0.2 % (ref 0.0–2.0)
BASOS ABS: 0 10*3/uL (ref 0.0–0.1)
EOS%: 0 % (ref 0.0–7.0)
Eosinophils Absolute: 0 10*3/uL (ref 0.0–0.5)
HEMATOCRIT: 27.9 % — AB (ref 34.8–46.6)
HEMOGLOBIN: 9.3 g/dL — AB (ref 11.6–15.9)
LYMPH%: 5.5 % — AB (ref 14.0–49.7)
MCH: 29.7 pg (ref 25.1–34.0)
MCHC: 33.3 g/dL (ref 31.5–36.0)
MCV: 89 fL (ref 79.5–101.0)
MONO#: 1.4 10*3/uL — AB (ref 0.1–0.9)
MONO%: 10.6 % (ref 0.0–14.0)
NEUT%: 83.7 % — ABNORMAL HIGH (ref 38.4–76.8)
NEUTROS ABS: 11 10*3/uL — AB (ref 1.5–6.5)
PLATELETS: 60 10*3/uL — AB (ref 145–400)
RBC: 3.14 10*6/uL — ABNORMAL LOW (ref 3.70–5.45)
RDW: 17.1 % — ABNORMAL HIGH (ref 11.2–14.5)
WBC: 13.1 10*3/uL — AB (ref 3.9–10.3)
lymph#: 0.7 10*3/uL — ABNORMAL LOW (ref 0.9–3.3)

## 2013-04-19 LAB — COMPREHENSIVE METABOLIC PANEL (CC13)
ALBUMIN: 3.5 g/dL (ref 3.5–5.0)
ALK PHOS: 68 U/L (ref 40–150)
ALT: 11 U/L (ref 0–55)
ANION GAP: 10 meq/L (ref 3–11)
AST: 23 U/L (ref 5–34)
BILIRUBIN TOTAL: 0.45 mg/dL (ref 0.20–1.20)
BUN: 27.3 mg/dL — AB (ref 7.0–26.0)
CO2: 23 mEq/L (ref 22–29)
Calcium: 9.8 mg/dL (ref 8.4–10.4)
Chloride: 103 mEq/L (ref 98–109)
Creatinine: 1.8 mg/dL — ABNORMAL HIGH (ref 0.6–1.1)
Glucose: 102 mg/dl (ref 70–140)
Potassium: 4.1 mEq/L (ref 3.5–5.1)
SODIUM: 136 meq/L (ref 136–145)
TOTAL PROTEIN: 8.7 g/dL — AB (ref 6.4–8.3)

## 2013-04-19 LAB — PROTIME-INR
INR: 2 (ref 2.00–3.50)
PROTIME: 24 s — AB (ref 10.6–13.4)

## 2013-04-19 LAB — URIC ACID (CC13): URIC ACID, SERUM: 7.1 mg/dL (ref 2.6–7.4)

## 2013-04-19 LAB — CA 125: CA 125: 121.3 U/mL — ABNORMAL HIGH (ref 0.0–30.2)

## 2013-04-19 LAB — POCT INR: INR: 2

## 2013-04-19 MED ORDER — OXYCODONE HCL 10 MG PO TABS: ORAL_TABLET | ORAL | Status: DC

## 2013-04-19 MED ORDER — ALLOPURINOL 100 MG PO TABS: 100.0000 mg | ORAL_TABLET | Freq: Every day | ORAL | Status: DC

## 2013-04-19 NOTE — Progress Notes (Signed)
INR within goal today. Hg/Hct: 9.3/27.9, Pltc = 60 No changes in diet or medications. No missed doses. Pt has had ~ 2 minor nosebleeds (very small amounts) when she blows her nose. She also has a small, tender area on the outside of her right thigh.  She will show Dr. Marko Plume today. She doesn't remember bumping into anything. The area is not bruised. No s/s of clotting noted. Will try to keep INR on the lower end of goal with Pltc = 60. Continue Coumadin 7.5mg  daily except for 5 mg on Tuesday and Thursday, and Sunday.  Recheck INR on 2/4; lab at 11:00am, Treatment at 11:30am and Coumadin clinic at 11:45am.
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
## 2013-04-19 NOTE — Telephone Encounter (Signed)
appts made and printed. Pt is aware that cs will call w/ appt for US RENAL...td

## 2013-04-19 NOTE — Patient Instructions (Signed)
Continue Coumadin 7.5mg  daily except for 5 mg on Tuesday and Thursday, and Sunday.  Recheck INR on 2/4; lab at 11:00am, Treatment at 11:30am and Coumadin clinic at 11:45am.

## 2013-04-20 ENCOUNTER — Other Ambulatory Visit: Payer: Self-pay | Admitting: Oncology

## 2013-04-21 ENCOUNTER — Ambulatory Visit (HOSPITAL_COMMUNITY)
Admission: RE | Admit: 2013-04-21 | Discharge: 2013-04-21 | Disposition: A | Discharge status: Home / Self Care | Payer: No Typology Code available for payment source | Source: Ambulatory Visit | Attending: Oncology | Admitting: Oncology

## 2013-04-21 ENCOUNTER — Telehealth: Payer: Self-pay | Admitting: Oncology

## 2013-04-21 DIAGNOSIS — N289 Disorder of kidney and ureter, unspecified: Secondary | ICD-10-CM | POA: Insufficient documentation

## 2013-04-21 DIAGNOSIS — I82409 Acute embolism and thrombosis of unspecified deep veins of unspecified lower extremity: Secondary | ICD-10-CM

## 2013-04-21 DIAGNOSIS — C541 Malignant neoplasm of endometrium: Secondary | ICD-10-CM

## 2013-04-21 DIAGNOSIS — R1900 Intra-abdominal and pelvic swelling, mass and lump, unspecified site: Secondary | ICD-10-CM | POA: Insufficient documentation

## 2013-04-21 NOTE — Telephone Encounter (Signed)
s.w. pt and advised on all appt.Marland KitchenMarland Kitchenpt will come get new sched today.

## 2013-04-21 NOTE — Telephone Encounter (Signed)
Medical Oncology  Tried to reach patient home and cell to let her know that renal US did not show any obstruction. LM that scan was fine and to call back if any questions. Godfrey Pick, MD

## 2013-04-24 ENCOUNTER — Telehealth: Payer: Self-pay

## 2013-04-24 ENCOUNTER — Other Ambulatory Visit: Payer: Self-pay | Admitting: Oncology

## 2013-04-24 ENCOUNTER — Other Ambulatory Visit (HOSPITAL_BASED_OUTPATIENT_CLINIC_OR_DEPARTMENT_OTHER): Payer: BC Managed Care – PPO

## 2013-04-24 ENCOUNTER — Encounter: Payer: Self-pay | Admitting: Pharmacist

## 2013-04-24 DIAGNOSIS — C541 Malignant neoplasm of endometrium: Secondary | ICD-10-CM

## 2013-04-24 DIAGNOSIS — C549 Malignant neoplasm of corpus uteri, unspecified: Secondary | ICD-10-CM

## 2013-04-24 DIAGNOSIS — I82409 Acute embolism and thrombosis of unspecified deep veins of unspecified lower extremity: Secondary | ICD-10-CM

## 2013-04-24 LAB — CBC WITH DIFFERENTIAL/PLATELET
BASO%: 0.2 % (ref 0.0–2.0)
BASOS ABS: 0 10*3/uL (ref 0.0–0.1)
EOS ABS: 0 10*3/uL (ref 0.0–0.5)
EOS%: 0.1 % (ref 0.0–7.0)
HCT: 25.8 % — ABNORMAL LOW (ref 34.8–46.6)
HEMOGLOBIN: 8.7 g/dL — AB (ref 11.6–15.9)
LYMPH%: 9.1 % — AB (ref 14.0–49.7)
MCH: 29.8 pg (ref 25.1–34.0)
MCHC: 33.6 g/dL (ref 31.5–36.0)
MCV: 88.7 fL (ref 79.5–101.0)
MONO#: 0.3 10*3/uL (ref 0.1–0.9)
MONO%: 5.7 % (ref 0.0–14.0)
NEUT%: 84.9 % — AB (ref 38.4–76.8)
NEUTROS ABS: 4.5 10*3/uL (ref 1.5–6.5)
PLATELETS: 91 10*3/uL — AB (ref 145–400)
RBC: 2.9 10*6/uL — ABNORMAL LOW (ref 3.70–5.45)
RDW: 16.7 % — ABNORMAL HIGH (ref 11.2–14.5)
WBC: 5.2 10*3/uL (ref 3.9–10.3)
lymph#: 0.5 10*3/uL — ABNORMAL LOW (ref 0.9–3.3)

## 2013-04-24 LAB — BASIC METABOLIC PANEL (CC13)
ANION GAP: 10 meq/L (ref 3–11)
BUN: 26.2 mg/dL — AB (ref 7.0–26.0)
CALCIUM: 10 mg/dL (ref 8.4–10.4)
CO2: 22 mEq/L (ref 22–29)
CREATININE: 1.6 mg/dL — AB (ref 0.6–1.1)
Chloride: 103 mEq/L (ref 98–109)
GLUCOSE: 96 mg/dL (ref 70–140)
Potassium: 4.4 mEq/L (ref 3.5–5.1)
Sodium: 134 mEq/L — ABNORMAL LOW (ref 136–145)

## 2013-04-24 LAB — PROTIME-INR
INR: 1.3 — ABNORMAL LOW (ref 2.00–3.50)
PROTIME: 15.6 s — AB (ref 10.6–13.4)

## 2013-04-24 NOTE — Telephone Encounter (Signed)
Caitlyn Higgins's platelet count is 91 k.  Her chemotherapy treatment will be rescheduled to 05-03-13 per Dr. Marko Plume. No bleeding noted or Sob.  Caitlyn Higgins states tha she feels good. She is to increase her coumadin dose to 7.5 mg daily per Dr. Marko Plume.  INR is 1.3 today.  Will have coumadin clinic check INR while here for treatment on 05-03-13.   Dr. Marko Plume will see patient in the infusion area on 05-03-13. Told her that the US showed no kidney blockage.  Patient verbalized understanding.

## 2013-04-24 NOTE — Progress Notes (Signed)
Patient seen by Dr. Marko Plume today for assessment prior to treatment, Hgb (8.7) and platelets low (91) Dr. Marko Plume dosing coumadin today (INR low today of 1.3 due to patient missing 1 dose) Per Dr. Marko Plume - increase dose to 7.5 mg daily and return on 2/11 for visit, treatment, and coumadin clinic visit (appointments scheduled) May consider changing tablet to 7.5 mg (pt currently using 5 mg tablets) if INR stablizes on 7.5 mg daily  Coumadin samples provided:  Coumadin 5 mg x 30 Tabs: LOT: [id number],   Exp: 6/16 And Coumadin 5 mg x 20 Tabs: LOT: [id number],   Exp: 9/16  Montel Clock, PharmD

## 2013-04-25 ENCOUNTER — Ambulatory Visit: Payer: BC Managed Care – PPO

## 2013-04-25 ENCOUNTER — Other Ambulatory Visit: Payer: BC Managed Care – PPO

## 2013-04-29 ENCOUNTER — Other Ambulatory Visit: Payer: Self-pay | Admitting: Oncology

## 2013-05-03 ENCOUNTER — Ambulatory Visit (HOSPITAL_BASED_OUTPATIENT_CLINIC_OR_DEPARTMENT_OTHER): Payer: No Typology Code available for payment source | Admitting: Oncology

## 2013-05-03 ENCOUNTER — Other Ambulatory Visit: Payer: BC Managed Care – PPO

## 2013-05-03 ENCOUNTER — Other Ambulatory Visit (HOSPITAL_BASED_OUTPATIENT_CLINIC_OR_DEPARTMENT_OTHER): Payer: No Typology Code available for payment source

## 2013-05-03 ENCOUNTER — Ambulatory Visit (HOSPITAL_BASED_OUTPATIENT_CLINIC_OR_DEPARTMENT_OTHER): Payer: No Typology Code available for payment source

## 2013-05-03 ENCOUNTER — Ambulatory Visit (HOSPITAL_BASED_OUTPATIENT_CLINIC_OR_DEPARTMENT_OTHER): Payer: No Typology Code available for payment source | Admitting: Pharmacist

## 2013-05-03 ENCOUNTER — Ambulatory Visit: Payer: BC Managed Care – PPO

## 2013-05-03 VITALS — BP 99/68 | HR 103 | Temp 97.8°F

## 2013-05-03 DIAGNOSIS — I10 Essential (primary) hypertension: Secondary | ICD-10-CM

## 2013-05-03 DIAGNOSIS — D649 Anemia, unspecified: Secondary | ICD-10-CM

## 2013-05-03 DIAGNOSIS — C541 Malignant neoplasm of endometrium: Secondary | ICD-10-CM

## 2013-05-03 DIAGNOSIS — N289 Disorder of kidney and ureter, unspecified: Secondary | ICD-10-CM

## 2013-05-03 DIAGNOSIS — C549 Malignant neoplasm of corpus uteri, unspecified: Secondary | ICD-10-CM

## 2013-05-03 DIAGNOSIS — Z5111 Encounter for antineoplastic chemotherapy: Secondary | ICD-10-CM

## 2013-05-03 DIAGNOSIS — Z809 Family history of malignant neoplasm, unspecified: Secondary | ICD-10-CM

## 2013-05-03 DIAGNOSIS — I82409 Acute embolism and thrombosis of unspecified deep veins of unspecified lower extremity: Secondary | ICD-10-CM

## 2013-05-03 DIAGNOSIS — Z803 Family history of malignant neoplasm of breast: Secondary | ICD-10-CM

## 2013-05-03 DIAGNOSIS — D6481 Anemia due to antineoplastic chemotherapy: Secondary | ICD-10-CM

## 2013-05-03 DIAGNOSIS — T451X5A Adverse effect of antineoplastic and immunosuppressive drugs, initial encounter: Secondary | ICD-10-CM

## 2013-05-03 LAB — CBC WITH DIFFERENTIAL/PLATELET
BASO%: 0.2 % (ref 0.0–2.0)
Basophils Absolute: 0 10*3/uL (ref 0.0–0.1)
EOS%: 0.2 % (ref 0.0–7.0)
Eosinophils Absolute: 0 10*3/uL (ref 0.0–0.5)
HCT: 29.3 % — ABNORMAL LOW (ref 34.8–46.6)
HGB: 9.4 g/dL — ABNORMAL LOW (ref 11.6–15.9)
LYMPH%: 15.6 % (ref 14.0–49.7)
MCH: 29 pg (ref 25.1–34.0)
MCHC: 32.1 g/dL (ref 31.5–36.0)
MCV: 90.4 fL (ref 79.5–101.0)
MONO#: 0.1 10*3/uL (ref 0.1–0.9)
MONO%: 1.7 % (ref 0.0–14.0)
NEUT#: 3.3 10*3/uL (ref 1.5–6.5)
NEUT%: 82.3 % — AB (ref 38.4–76.8)
PLATELETS: 444 10*3/uL — AB (ref 145–400)
RBC: 3.24 10*6/uL — AB (ref 3.70–5.45)
RDW: 17.6 % — ABNORMAL HIGH (ref 11.2–14.5)
WBC: 4 10*3/uL (ref 3.9–10.3)
lymph#: 0.6 10*3/uL — ABNORMAL LOW (ref 0.9–3.3)

## 2013-05-03 LAB — POCT INR: INR: 2.5

## 2013-05-03 LAB — COMPREHENSIVE METABOLIC PANEL (CC13)
ALT: 8 U/L (ref 0–55)
ANION GAP: 9 meq/L (ref 3–11)
AST: 20 U/L (ref 5–34)
Albumin: 3.5 g/dL (ref 3.5–5.0)
Alkaline Phosphatase: 59 U/L (ref 40–150)
BUN: 14.2 mg/dL (ref 7.0–26.0)
CHLORIDE: 102 meq/L (ref 98–109)
CO2: 23 mEq/L (ref 22–29)
Calcium: 9.7 mg/dL (ref 8.4–10.4)
Creatinine: 1.2 mg/dL — ABNORMAL HIGH (ref 0.6–1.1)
GLUCOSE: 146 mg/dL — AB (ref 70–140)
Potassium: 4.3 mEq/L (ref 3.5–5.1)
Sodium: 134 mEq/L — ABNORMAL LOW (ref 136–145)
TOTAL PROTEIN: 8.9 g/dL — AB (ref 6.4–8.3)
Total Bilirubin: 0.54 mg/dL (ref 0.20–1.20)

## 2013-05-03 LAB — PROTIME-INR
INR: 2.5 (ref 2.00–3.50)
Protime: 30 Seconds — ABNORMAL HIGH (ref 10.6–13.4)

## 2013-05-03 LAB — CA 125: CA 125: 144 U/mL — ABNORMAL HIGH (ref 0.0–30.2)

## 2013-05-03 MED ORDER — FAMOTIDINE IN NACL 20-0.9 MG/50ML-% IV SOLN
INTRAVENOUS | Status: AC
  Filled 2013-05-03: qty 50

## 2013-05-03 MED ORDER — DEXAMETHASONE SODIUM PHOSPHATE 20 MG/5ML IJ SOLN
20.0000 mg | Freq: Once | INTRAMUSCULAR | Status: AC
  Administered 2013-05-03: 20 mg via INTRAVENOUS

## 2013-05-03 MED ORDER — SODIUM CHLORIDE 0.9 % IJ SOLN
10.0000 mL | INTRAMUSCULAR | Status: DC | PRN
  Administered 2013-05-03: 10 mL
  Filled 2013-05-03: qty 10

## 2013-05-03 MED ORDER — DIPHENHYDRAMINE HCL 50 MG/ML IJ SOLN
INTRAMUSCULAR | Status: AC
  Filled 2013-05-03: qty 1

## 2013-05-03 MED ORDER — HEPARIN SOD (PORK) LOCK FLUSH 100 UNIT/ML IV SOLN
500.0000 [IU] | Freq: Once | INTRAVENOUS | Status: AC | PRN
  Administered 2013-05-03: 500 [IU]
  Filled 2013-05-03: qty 5

## 2013-05-03 MED ORDER — DEXAMETHASONE SODIUM PHOSPHATE 20 MG/5ML IJ SOLN
INTRAMUSCULAR | Status: AC
  Filled 2013-05-03: qty 5

## 2013-05-03 MED ORDER — SODIUM CHLORIDE 0.9 % IV SOLN
Freq: Once | INTRAVENOUS | Status: AC
  Administered 2013-05-03: 12:00:00 via INTRAVENOUS

## 2013-05-03 MED ORDER — ONDANSETRON 16 MG/50ML IVPB (CHCC)
INTRAVENOUS | Status: AC
  Filled 2013-05-03: qty 16

## 2013-05-03 MED ORDER — CARBOPLATIN CHEMO INJECTION 450 MG/45ML
350.4000 mg | Freq: Once | INTRAVENOUS | Status: AC
  Administered 2013-05-03: 350 mg via INTRAVENOUS
  Filled 2013-05-03: qty 35

## 2013-05-03 MED ORDER — FAMOTIDINE IN NACL 20-0.9 MG/50ML-% IV SOLN
20.0000 mg | Freq: Once | INTRAVENOUS | Status: AC
  Administered 2013-05-03: 20 mg via INTRAVENOUS

## 2013-05-03 MED ORDER — PACLITAXEL CHEMO INJECTION 300 MG/50ML
175.0000 mg/m2 | Freq: Once | INTRAVENOUS | Status: AC
  Administered 2013-05-03: 390 mg via INTRAVENOUS
  Filled 2013-05-03: qty 65

## 2013-05-03 MED ORDER — DIPHENHYDRAMINE HCL 50 MG/ML IJ SOLN
50.0000 mg | Freq: Once | INTRAMUSCULAR | Status: AC
  Administered 2013-05-03: 50 mg via INTRAVENOUS

## 2013-05-03 MED ORDER — ONDANSETRON 16 MG/50ML IVPB (CHCC)
16.0000 mg | Freq: Once | INTRAVENOUS | Status: AC
  Administered 2013-05-03: 16 mg via INTRAVENOUS
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
## 2013-05-03 NOTE — Patient Instructions (Signed)
Continue Coumadin 7.5mg  daily.  Recheck INR on 2/18; lab at 10:45am, Coumadin clinic at 11:00am and injection at 11:30am.

## 2013-05-03 NOTE — Patient Instructions (Signed)
[facility] Discharge Instructions for Patients Receiving Chemotherapy  Today you received the following chemotherapy agents Taxol/Carboplatin To help prevent nausea and vomiting after your treatment, we encourage you to take your nausea medication as prescribed.  If you develop nausea and vomiting that is not controlled by your nausea medication, call the clinic.   BELOW ARE SYMPTOMS THAT SHOULD BE REPORTED IMMEDIATELY:  *FEVER GREATER THAN 100.5 F  *CHILLS WITH OR WITHOUT FEVER  NAUSEA AND VOMITING THAT IS NOT CONTROLLED WITH YOUR NAUSEA MEDICATION  *UNUSUAL SHORTNESS OF BREATH  *UNUSUAL BRUISING OR BLEEDING  TENDERNESS IN MOUTH AND THROAT WITH OR WITHOUT PRESENCE OF ULCERS  *URINARY PROBLEMS  *BOWEL PROBLEMS  UNUSUAL RASH Items with * indicate a potential emergency and should be followed up as soon as possible.  Feel free to call the clinic you have any questions or concerns. The clinic phone number is [phone].    

## 2013-05-03 NOTE — Progress Notes (Signed)
INR within goal today. Hg/Hct:  9.4/29.3, Pltc = 444 Coumadin dose was increased to 7.5mg  daily on 04/24/13 by Dr. Marko Plume Pt for treatment with Carbo/Taxol today. Pt doing well. No changes in diet report. No missed doses of coumadin. Pt may be taking Omeprazole 40mg  daily instead of the documented esomeprazole 40mg  daily.  She will check. No concerns regarding anticoagulation. No s/s of clotting. Continue Coumadin 7.5mg  daily.  Recheck INR on 2/18; lab at 10:45am, Coumadin clinic at 11:00am and injection at 11:30am.   Coumadin 5mg  samples provided (x 20 tablets): Lot: [id number] Exp: 9/16

## 2013-05-04 ENCOUNTER — Encounter: Payer: Self-pay | Admitting: Oncology

## 2013-05-05 ENCOUNTER — Other Ambulatory Visit: Payer: BC Managed Care – PPO

## 2013-05-05 ENCOUNTER — Ambulatory Visit: Payer: BC Managed Care – PPO | Admitting: Oncology

## 2013-05-10 ENCOUNTER — Ambulatory Visit (HOSPITAL_BASED_OUTPATIENT_CLINIC_OR_DEPARTMENT_OTHER): Payer: No Typology Code available for payment source

## 2013-05-10 ENCOUNTER — Other Ambulatory Visit (HOSPITAL_BASED_OUTPATIENT_CLINIC_OR_DEPARTMENT_OTHER): Payer: No Typology Code available for payment source

## 2013-05-10 ENCOUNTER — Ambulatory Visit: Payer: No Typology Code available for payment source | Admitting: Pharmacist

## 2013-05-10 ENCOUNTER — Other Ambulatory Visit: Payer: Self-pay

## 2013-05-10 VITALS — BP 118/69 | HR 113 | Temp 98.0°F

## 2013-05-10 DIAGNOSIS — Z5189 Encounter for other specified aftercare: Secondary | ICD-10-CM

## 2013-05-10 DIAGNOSIS — C549 Malignant neoplasm of corpus uteri, unspecified: Secondary | ICD-10-CM

## 2013-05-10 DIAGNOSIS — I82409 Acute embolism and thrombosis of unspecified deep veins of unspecified lower extremity: Secondary | ICD-10-CM

## 2013-05-10 DIAGNOSIS — D649 Anemia, unspecified: Secondary | ICD-10-CM

## 2013-05-10 DIAGNOSIS — C541 Malignant neoplasm of endometrium: Secondary | ICD-10-CM

## 2013-05-10 LAB — CBC WITH DIFFERENTIAL/PLATELET
BASO%: 1.3 % (ref 0.0–2.0)
Basophils Absolute: 0 10*3/uL (ref 0.0–0.1)
EOS%: 0 % (ref 0.0–7.0)
Eosinophils Absolute: 0 10*3/uL (ref 0.0–0.5)
HEMATOCRIT: 25.4 % — AB (ref 34.8–46.6)
HGB: 8.3 g/dL — ABNORMAL LOW (ref 11.6–15.9)
LYMPH#: 0.8 10*3/uL — AB (ref 0.9–3.3)
LYMPH%: 32.2 % (ref 14.0–49.7)
MCH: 29.5 pg (ref 25.1–34.0)
MCHC: 32.7 g/dL (ref 31.5–36.0)
MCV: 90.4 fL (ref 79.5–101.0)
MONO#: 0.2 10*3/uL (ref 0.1–0.9)
MONO%: 9.9 % (ref 0.0–14.0)
NEUT#: 1.3 10*3/uL — ABNORMAL LOW (ref 1.5–6.5)
NEUT%: 56.6 % (ref 38.4–76.8)
Platelets: 255 10*3/uL (ref 145–400)
RBC: 2.81 10*6/uL — ABNORMAL LOW (ref 3.70–5.45)
RDW: 17.6 % — ABNORMAL HIGH (ref 11.2–14.5)
WBC: 2.3 10*3/uL — ABNORMAL LOW (ref 3.9–10.3)
nRBC: 0 % (ref 0–0)

## 2013-05-10 LAB — PROTIME-INR
INR: 1.3 — AB (ref 2.00–3.50)
Protime: 15.6 Seconds — ABNORMAL HIGH (ref 10.6–13.4)

## 2013-05-10 LAB — POCT INR: INR: 1.3

## 2013-05-10 MED ORDER — ALLOPURINOL 100 MG PO TABS: 100.0000 mg | ORAL_TABLET | Freq: Every day | ORAL | Status: DC

## 2013-05-10 MED ORDER — PEGFILGRASTIM INJECTION 6 MG/0.6ML
6.0000 mg | Freq: Once | SUBCUTANEOUS | Status: AC
  Administered 2013-05-10: 6 mg via SUBCUTANEOUS
  Filled 2013-05-10: qty 0.6

## 2013-05-10 MED ORDER — DEXAMETHASONE 4 MG PO TABS: ORAL_TABLET | ORAL | Status: DC

## 2013-05-10 MED ORDER — DEXAMETHASONE 4 MG PO TABS: 20.0000 mg | ORAL_TABLET | ORAL | Status: DC

## 2013-05-10 MED ORDER — ONDANSETRON 4 MG PO TBDP: 4.0000 mg | ORAL_TABLET | Freq: Three times a day (TID) | ORAL | Status: DC | PRN

## 2013-05-10 MED ORDER — FERROUS FUMARATE 325 (106 FE) MG PO TABS: 1.0000 | ORAL_TABLET | Freq: Every day | ORAL | Status: DC

## 2013-05-10 MED ORDER — OXYCODONE HCL ER 15 MG PO T12A: 15.0000 mg | EXTENDED_RELEASE_TABLET | Freq: Two times a day (BID) | ORAL | Status: DC

## 2013-05-10 MED ORDER — OXYCODONE HCL 10 MG PO TABS: ORAL_TABLET | ORAL | Status: DC

## 2013-05-10 NOTE — Telephone Encounter (Signed)
[name]'s FMLA ran out and she has medicaid pending.  Spoke with Caitlyn Higgins in  [facility] care and processed [name]"s application for the $161 assistace with [facility].  [name] will pick up prescriptions for pain meds at visit with Dr. Illene Higgins and the other ones sent today to [facility].

## 2013-05-10 NOTE — Progress Notes (Signed)
INR = 1.3   Goal 2-3 Subtherapeutic today.  Patient states she missed a dose of Coumadin on Monday 2/16.   Patient also has been eating greens 3 days this week as well as having salad.  This is unusual for her. Her INR was previously stable. Discussed diet with patient.  She will decrease her intake of greens. Patient is usually sedentary.  Discussed need to get up and walk around every few hours due to increased risk of blood clots if sedentary. She says she will do this. She was instructed to take Coumadin 10 mg today and tomorrow, then resume 7.5 mg daily. She has appointments for Monday for lab at 8:45, Flush at 9:00 and Dr. Marko Plume at 9:30.   She will be seen in Coumadin Clinic at 10:00.  Samples given today:  Coumadin 5 mg  #10     Lot:  [id number]   Exp:  9/16  Theone Murdoch, PharmD  NO CHARGE - PATIENT ALSO HAS FLUSH APPT.

## 2013-05-11 ENCOUNTER — Ambulatory Visit: Payer: No Typology Code available for payment source | Attending: Gynecologic Oncology | Admitting: Gynecologic Oncology

## 2013-05-11 ENCOUNTER — Other Ambulatory Visit: Payer: Self-pay | Admitting: Oncology

## 2013-05-11 ENCOUNTER — Encounter: Payer: Self-pay | Admitting: Gynecologic Oncology

## 2013-05-11 VITALS — BP 124/64 | HR 108 | Temp 98.2°F | Resp 16 | Ht 67.91 in | Wt 205.7 lb

## 2013-05-11 DIAGNOSIS — C787 Secondary malignant neoplasm of liver and intrahepatic bile duct: Secondary | ICD-10-CM | POA: Insufficient documentation

## 2013-05-11 DIAGNOSIS — C7960 Secondary malignant neoplasm of unspecified ovary: Secondary | ICD-10-CM | POA: Insufficient documentation

## 2013-05-11 DIAGNOSIS — C772 Secondary and unspecified malignant neoplasm of intra-abdominal lymph nodes: Secondary | ICD-10-CM | POA: Insufficient documentation

## 2013-05-11 DIAGNOSIS — K219 Gastro-esophageal reflux disease without esophagitis: Secondary | ICD-10-CM | POA: Insufficient documentation

## 2013-05-11 DIAGNOSIS — C775 Secondary and unspecified malignant neoplasm of intrapelvic lymph nodes: Secondary | ICD-10-CM | POA: Insufficient documentation

## 2013-05-11 DIAGNOSIS — I10 Essential (primary) hypertension: Secondary | ICD-10-CM | POA: Insufficient documentation

## 2013-05-11 DIAGNOSIS — R1900 Intra-abdominal and pelvic swelling, mass and lump, unspecified site: Secondary | ICD-10-CM

## 2013-05-11 DIAGNOSIS — C786 Secondary malignant neoplasm of retroperitoneum and peritoneum: Secondary | ICD-10-CM | POA: Insufficient documentation

## 2013-05-11 DIAGNOSIS — C541 Malignant neoplasm of endometrium: Secondary | ICD-10-CM

## 2013-05-11 DIAGNOSIS — C55 Malignant neoplasm of uterus, part unspecified: Secondary | ICD-10-CM

## 2013-05-11 NOTE — Progress Notes (Signed)
Office Visit:  GYN ONCOLOGY  Chief Complaint:  Uterine carcinosarcoma  Assessment/Plan  [name] is a 48 y.o. 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 She is S/P IVC filter. [name] has received 3 cycles of therapy and on physical examination there is no evidence of disease in the cervix and no bleeding has been present for approximately 3 weeks. She continues to have a significant amount of weight loss, overall feels better and her appetite is better.  Recommended to the patient is to proceed with cycle #4 of chemotherapy scheduled already for 05/24/2013. Plan for a  preop visit on 06/22/2013 and interval hysterectomy bilateral salpingo-oophorectomy and omentectomy  occur on 07/18/2013.  Prior to surgery she'll need to be transitioned from warfarin back to Lovenox.  A discussion was held that hysterectomy is important to prevent further bleeding.  The patient was gently informed that this disease has a very poor prognosis.  Neither she nor her sister pressed for more clarification.   History of Present Illness  Caitlyn Higgins is a 48 y.o. who presented with a  pelvic mass, to [facility] and was [facility]to [facility] 02/23/2013.   The patient feels that she was in her usual state of health until a few days prior to presentation when she noted abdominal distention and bloating. She presented to her gastroenterologist and was sent for a CT scan.  The CT 02/22/2013 demonstrated " 1. 24.1 x 16.8 x 19.9 cm complex cystic mass which appears to arise from the pelvis extending into the mid upper abdomen is highly suspicious for an ovarian neoplasm. There is evidence of early omental disease, as well as a serosal implant upon the surface of the liver, with small volume of probable malignant ascites, and potential  transdiaphragmatic extension based on small lymph nodes immediately above the diaphragm.  2. Importantly, this pelvic mass is causing severe compression of the proximal inferior vena cava, and there is a large volume of deep venous thrombosis in the visualized portion of the right thigh involving right superficial femoral, profunda femoral and common femoral veins. Additionally, there is a mass like peripheral wedge-shaped opacity in the right lower lobe. In the setting of deep venous thrombosis, this wedge-shaped opacity would be most concerning for potential pulmonary infarction. Associated with this, there is a trace right pleural effusion. 3. 5.2 x 1.8 cm heterogeneously enhancing lesion  associated with the liver capsule overlying the right lobe of the  liver "  She was transferred to [facility].    IVC filter was placed and she was started on Lovenox.  She was subsequently transitioned to warfarin.   A biopsy of the pelvic mass and the mesenteric lesion was collected on 02/24/2013.  Pathology A. Pelvic mass, core biopsy and touch prep:  - Spindle cell proliferation with dense fibrosis and focal mixed, predominantly chronic, inflammation, favor reactive/inflammatory process  B. Mesenteric lesion, core biopsy and touch prep:  - Fibroadipose tissue with polytypic plasma cell infiltrate   In December 2014 an endometrial biopsy was collected. Pathology :  1. Cervix, biopsy - HIGH GRADE ENDOMETRIAL CARCINOMA, SEE COMMENT. 2. Endometrium, biopsy - HIGH GRADE ENDOMETRIAL CARCINOMA, SEE COMMENT. Microscopic Comment 1. , 2. In both parts 1 and 2, slide sections demonstrate extensive involvement by high grade endometrial carcinoma demonstrating a biphasic epithelial and stromal architecture. The  immunophenotype (strong diffuse estrogen receptor; strong diffuse vimentin expression; patchy monoclonal CEA expression, patchy p16 immunostain expression, and diffuse mild to moderate p53 expression) is consistent  with primary endometrial origin. 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  [name] has received 3 cycles of Taxol carboplatin therapy. Cycle 3 was administered on 05/03/2012. She reports an excellent appetite denies nausea or vomiting denies hemoptysis reports occasional shortness of breath with exertion.  States that last episode of vaginal bleeding occurred approximately 3 weeks ago.  Past Medical History  Diagnosis Date  . Reflux   . Hypertension   . Cancer     uterine carcinosarcoma   Past Surgical History  Procedure Laterality Date  . No past surgeries       GYN History G0 Hx of Abnormal Pap Smears: no, last pap ~2010  Hx of STIs: no  Never had regular periods.    Family History  family history is not on file.   Review of Systems  A 10 point review of systems is notable for a 25-30 pound weight loss in 2 months prior to her diagnosis. There's been 30 pound weight loss since the visit in December 2014.  Denies cough, reports mild shortness of breath with exertion, no hemoptysis, reports intermittent nausea she reports constipation. Reports fatigue. States that there's been no vaginal bleeding over the last 3 weeks  Physical Exam  BP 124/64  Pulse 108  Temp(Src) 98.2 F (36.8 C) (Oral)  Resp 16  Ht 5' 7.91" (1.725 m)  Wt 205 lb 11.2 oz (93.305 kg)  BMI 31.36 kg/m2 Wt Readings from Last 3 Encounters:  05/11/13 205 lb 11.2 oz (93.305 kg)  04/19/13 211 lb 3.2 oz (95.8 kg)  04/10/13 210 lb 11.2 oz (95.573 kg)   General: No acute distress.  HEENT: Extra occular muscles grossly intact.  Pulmonary: CTAB. Normal work of breathing.  Cardiovascular: RRR.  Abdomen: Firm, moderately distended, non-tender. Pelvic and abdominal mass is palpable.  No rebound/guarding. Nodules from prior Lovenox injections visible and palpable,  nontender Musculoskeletal: Grossly normal ROM.  Extremities: Warm and well-perfused. 1+ pitting edema  Neurological: Alert and oriented x3.  Psychological: Appropriate mood/affect.  Genitourinary: Normal external genitalia Bartholin's urethra and Skene's. No blood is noted within the vaginal vault. The cervix is approximately 3 cm no visible tumor on the cervix. A large multilobulated mass within the pelvis Rectal: Good tone and pelvic masses palpable no intraluminal masses.

## 2013-05-11 NOTE — Patient Instructions (Signed)
Plans for four cycle of chemo to be given at the beginning of March.  We will schedule you for a repeat CT scan the end of March and you will see Dr. Skeet Latch on April 2 for a pre-operative visit with plans for surgery on April 28.  Please call for any questions or concerns.

## 2013-05-12 ENCOUNTER — Telehealth: Payer: Self-pay | Admitting: *Deleted

## 2013-05-12 ENCOUNTER — Other Ambulatory Visit: Payer: Self-pay | Admitting: Oncology

## 2013-05-12 DIAGNOSIS — C541 Malignant neoplasm of endometrium: Secondary | ICD-10-CM

## 2013-05-12 DIAGNOSIS — I82409 Acute embolism and thrombosis of unspecified deep veins of unspecified lower extremity: Secondary | ICD-10-CM

## 2013-05-12 NOTE — Addendum Note (Signed)
Addended by: Joylene John D on: 05/12/2013 04:02 PM   Modules accepted: Level of Service

## 2013-05-12 NOTE — Telephone Encounter (Signed)
Per staff message and POF I have scheduled appts.  JMW  

## 2013-05-12 NOTE — Telephone Encounter (Signed)
sw Pt she's aware of her appts. shes also aware that tx will be added. i emailed MW to add the tx.pt will get an avs on 05/15/13....td

## 2013-05-15 ENCOUNTER — Ambulatory Visit: Payer: Self-pay

## 2013-05-15 ENCOUNTER — Ambulatory Visit (HOSPITAL_BASED_OUTPATIENT_CLINIC_OR_DEPARTMENT_OTHER): Payer: No Typology Code available for payment source | Admitting: Oncology

## 2013-05-15 ENCOUNTER — Ambulatory Visit: Payer: No Typology Code available for payment source

## 2013-05-15 ENCOUNTER — Ambulatory Visit (HOSPITAL_COMMUNITY)
Admission: RE | Admit: 2013-05-15 | Discharge: 2013-05-15 | Disposition: A | Discharge status: Home / Self Care | Payer: BC Managed Care – PPO | Source: Ambulatory Visit | Attending: Oncology | Admitting: Oncology

## 2013-05-15 ENCOUNTER — Encounter: Payer: Self-pay | Admitting: Oncology

## 2013-05-15 ENCOUNTER — Other Ambulatory Visit (HOSPITAL_BASED_OUTPATIENT_CLINIC_OR_DEPARTMENT_OTHER): Payer: No Typology Code available for payment source

## 2013-05-15 ENCOUNTER — Ambulatory Visit (HOSPITAL_BASED_OUTPATIENT_CLINIC_OR_DEPARTMENT_OTHER): Payer: No Typology Code available for payment source

## 2013-05-15 VITALS — BP 101/64 | HR 108 | Temp 98.6°F | Resp 18 | Ht 67.91 in | Wt 211.4 lb

## 2013-05-15 DIAGNOSIS — C549 Malignant neoplasm of corpus uteri, unspecified: Secondary | ICD-10-CM

## 2013-05-15 DIAGNOSIS — D649 Anemia, unspecified: Secondary | ICD-10-CM

## 2013-05-15 DIAGNOSIS — C541 Malignant neoplasm of endometrium: Secondary | ICD-10-CM

## 2013-05-15 DIAGNOSIS — I82409 Acute embolism and thrombosis of unspecified deep veins of unspecified lower extremity: Secondary | ICD-10-CM

## 2013-05-15 DIAGNOSIS — N289 Disorder of kidney and ureter, unspecified: Secondary | ICD-10-CM

## 2013-05-15 DIAGNOSIS — Z95828 Presence of other vascular implants and grafts: Secondary | ICD-10-CM

## 2013-05-15 DIAGNOSIS — Z452 Encounter for adjustment and management of vascular access device: Secondary | ICD-10-CM

## 2013-05-15 LAB — COMPREHENSIVE METABOLIC PANEL (CC13)
ALT: 17 U/L (ref 0–55)
ANION GAP: 9 meq/L (ref 3–11)
AST: 41 U/L — AB (ref 5–34)
Albumin: 3.6 g/dL (ref 3.5–5.0)
Alkaline Phosphatase: 78 U/L (ref 40–150)
BILIRUBIN TOTAL: 0.42 mg/dL (ref 0.20–1.20)
BUN: 16.2 mg/dL (ref 7.0–26.0)
CO2: 24 mEq/L (ref 22–29)
CREATININE: 1.3 mg/dL — AB (ref 0.6–1.1)
Calcium: 9.4 mg/dL (ref 8.4–10.4)
Chloride: 103 mEq/L (ref 98–109)
Glucose: 96 mg/dl (ref 70–140)
Potassium: 3.9 mEq/L (ref 3.5–5.1)
Sodium: 136 mEq/L (ref 136–145)
Total Protein: 8.5 g/dL — ABNORMAL HIGH (ref 6.4–8.3)

## 2013-05-15 LAB — CBC WITH DIFFERENTIAL/PLATELET
BASO%: 0.2 % (ref 0.0–2.0)
Basophils Absolute: 0 10*3/uL (ref 0.0–0.1)
EOS%: 0 % (ref 0.0–7.0)
Eosinophils Absolute: 0 10*3/uL (ref 0.0–0.5)
HCT: 22.3 % — ABNORMAL LOW (ref 34.8–46.6)
HGB: 7.5 g/dL — ABNORMAL LOW (ref 11.6–15.9)
LYMPH%: 5.6 % — AB (ref 14.0–49.7)
MCH: 30.3 pg (ref 25.1–34.0)
MCHC: 33.7 g/dL (ref 31.5–36.0)
MCV: 90 fL (ref 79.5–101.0)
MONO#: 3 10*3/uL — ABNORMAL HIGH (ref 0.1–0.9)
MONO%: 12.6 % (ref 0.0–14.0)
NEUT#: 19.5 10*3/uL — ABNORMAL HIGH (ref 1.5–6.5)
NEUT%: 81.6 % — AB (ref 38.4–76.8)
PLATELETS: 173 10*3/uL (ref 145–400)
RBC: 2.48 10*6/uL — ABNORMAL LOW (ref 3.70–5.45)
RDW: 18.5 % — ABNORMAL HIGH (ref 11.2–14.5)
WBC: 24 10*3/uL — ABNORMAL HIGH (ref 3.9–10.3)
lymph#: 1.3 10*3/uL (ref 0.9–3.3)

## 2013-05-15 LAB — CA 125: CA 125: 90.6 U/mL — ABNORMAL HIGH (ref 0.0–30.2)

## 2013-05-15 LAB — PROTHROMBIN TIME
INR: 1.66 — AB (ref <1.50)
Prothrombin Time: 19.1 seconds — ABNORMAL HIGH (ref 11.6–15.2)

## 2013-05-15 MED ORDER — LORAZEPAM 0.5 MG PO TABS: ORAL_TABLET | ORAL | Status: DC

## 2013-05-15 MED ORDER — OMEPRAZOLE 40 MG PO CPDR: 40.0000 mg | DELAYED_RELEASE_CAPSULE | Freq: Every day | ORAL | Status: DC

## 2013-05-15 MED ORDER — HEPARIN SOD (PORK) LOCK FLUSH 100 UNIT/ML IV SOLN
500.0000 [IU] | Freq: Once | INTRAVENOUS | Status: AC
  Administered 2013-05-15: 500 [IU] via INTRAVENOUS
  Filled 2013-05-15: qty 5

## 2013-05-15 MED ORDER — SODIUM CHLORIDE 0.9 % IJ SOLN
10.0000 mL | INTRAMUSCULAR | Status: DC | PRN
  Administered 2013-05-15: 10 mL via INTRAVENOUS
  Filled 2013-05-15: qty 10

## 2013-05-15 NOTE — Patient Instructions (Signed)
Increase coumadin to 10 mg daily.  We will give you one unit of packed red blood cells on 2-25. Continue iron.  Coumadin clinic will recheck the coumadin with your chemo on 05-24-13

## 2013-05-15 NOTE — Patient Instructions (Signed)
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
## 2013-05-16 ENCOUNTER — Telehealth: Payer: Self-pay

## 2013-05-16 ENCOUNTER — Other Ambulatory Visit: Payer: Self-pay | Admitting: Oncology

## 2013-05-16 NOTE — Telephone Encounter (Signed)
Told Caitlyn Higgins the results of ca-125 and plan to verify next  treatment with surgery as noted below by Dr. Marko Plume.

## 2013-05-16 NOTE — Telephone Encounter (Signed)
Message copied by Baruch Merl on Tue May 16, 2013  3:09 PM ------      Message from: Gordy Levan      Created: Tue May 16, 2013  8:56 AM       Labs seen and need follow up: please let her know, by phone or when she is at office this week for PRBCs, that CA 125 down to 90 yesterday. I have sent a message to Dr Skeet Latch to let her know date of next chemo, to be sure she agrees with the timing from that until surgery. ------

## 2013-05-17 ENCOUNTER — Ambulatory Visit (HOSPITAL_BASED_OUTPATIENT_CLINIC_OR_DEPARTMENT_OTHER): Payer: No Typology Code available for payment source

## 2013-05-17 VITALS — BP 94/58 | HR 84 | Temp 98.6°F | Resp 18

## 2013-05-17 DIAGNOSIS — C549 Malignant neoplasm of corpus uteri, unspecified: Secondary | ICD-10-CM

## 2013-05-17 DIAGNOSIS — C541 Malignant neoplasm of endometrium: Secondary | ICD-10-CM

## 2013-05-17 LAB — PREPARE RBC (CROSSMATCH)

## 2013-05-17 MED ORDER — DIPHENHYDRAMINE HCL 25 MG PO CAPS
ORAL_CAPSULE | ORAL | Status: AC
  Filled 2013-05-17: qty 1

## 2013-05-17 MED ORDER — ACETAMINOPHEN 325 MG PO TABS
ORAL_TABLET | ORAL | Status: AC
  Filled 2013-05-17: qty 2

## 2013-05-17 MED ORDER — SODIUM CHLORIDE 0.9 % IV SOLN
Freq: Once | INTRAVENOUS | Status: AC
  Administered 2013-05-17: 08:00:00 via INTRAVENOUS

## 2013-05-17 MED ORDER — DIPHENHYDRAMINE HCL 25 MG PO CAPS
25.0000 mg | ORAL_CAPSULE | Freq: Once | ORAL | Status: AC
  Administered 2013-05-17: 25 mg via ORAL

## 2013-05-17 MED ORDER — ACETAMINOPHEN 325 MG PO TABS
650.0000 mg | ORAL_TABLET | Freq: Once | ORAL | Status: AC
  Administered 2013-05-17: 650 mg via ORAL

## 2013-05-17 NOTE — Patient Instructions (Signed)
Blood Transfusion Information WHAT IS A BLOOD TRANSFUSION? A transfusion is the replacement of blood or some of its parts. Blood is made up of multiple cells which provide different functions.  Red blood cells carry oxygen and are used for blood loss replacement.  White blood cells fight against infection.  Platelets control bleeding.  Plasma helps clot blood.  Other blood products are available for specialized needs, such as hemophilia or other clotting disorders. BEFORE THE TRANSFUSION  Who gives blood for transfusions?   You may be able to donate blood to be used at a later date on yourself (autologous donation).  Relatives can be asked to donate blood. This is generally not any safer than if you have received blood from a stranger. The same precautions are taken to ensure safety when a relative's blood is donated.  Healthy volunteers who are fully evaluated to make sure their blood is safe. This is blood bank blood. Transfusion therapy is the safest it has ever been in the practice of medicine. Before blood is taken from a donor, a complete history is taken to make sure that person has no history of diseases nor engages in risky social behavior (examples are intravenous drug use or sexual activity with multiple partners). The donor's travel history is screened to minimize risk of transmitting infections, such as malaria. The donated blood is tested for signs of infectious diseases, such as HIV and hepatitis. The blood is then tested to be sure it is compatible with you in order to minimize the chance of a transfusion reaction. If you or a relative donates blood, this is often done in anticipation of surgery and is not appropriate for emergency situations. It takes many days to process the donated blood. RISKS AND COMPLICATIONS Although transfusion therapy is very safe and saves many lives, the main dangers of transfusion include:   Getting an infectious disease.  Developing a  transfusion reaction. This is an allergic reaction to something in the blood you were given. Every precaution is taken to prevent this. The decision to have a blood transfusion has been considered carefully by your caregiver before blood is given. Blood is not given unless the benefits outweigh the risks. AFTER THE TRANSFUSION  Right after receiving a blood transfusion, you will usually feel much better and more energetic. This is especially true if your red blood cells have gotten low (anemic). The transfusion raises the level of the red blood cells which carry oxygen, and this usually causes an energy increase.  The nurse administering the transfusion will monitor you carefully for complications. HOME CARE INSTRUCTIONS  No special instructions are needed after a transfusion. You may find your energy is better. Speak with your caregiver about any limitations on activity for underlying diseases you may have. SEEK MEDICAL CARE IF:   Your condition is not improving after your transfusion.  You develop redness or irritation at the intravenous (IV) site. SEEK IMMEDIATE MEDICAL CARE IF:  Any of the following symptoms occur over the next 12 hours:  Shaking chills.  You have a temperature by mouth above 102 F (38.9 C), not controlled by medicine.  Chest, back, or muscle pain.  People around you feel you are not acting correctly or are confused.  Shortness of breath or difficulty breathing.  Dizziness and fainting.  You get a rash or develop hives.  You have a decrease in urine output.  Your urine turns a dark color or changes to pink, red, or brown. Any of the following   symptoms occur over the next 10 days:  You have a temperature by mouth above 102 F (38.9 C), not controlled by medicine.  Shortness of breath.  Weakness after normal activity.  The white part of the eye turns yellow (jaundice).  You have a decrease in the amount of urine or are urinating less often.  Your  urine turns a dark color or changes to pink, red, or brown. Document Released: 03/06/2000 Document Revised: 06/01/2011 Document Reviewed: 10/24/2007 Exit[employer] Patient Information 2014 [employer].  

## 2013-05-18 LAB — TYPE AND SCREEN
ABO/RH(D): O POS
Antibody Screen: NEGATIVE
Unit division: 0

## 2013-05-24 ENCOUNTER — Other Ambulatory Visit (HOSPITAL_BASED_OUTPATIENT_CLINIC_OR_DEPARTMENT_OTHER): Payer: No Typology Code available for payment source

## 2013-05-24 ENCOUNTER — Ambulatory Visit (HOSPITAL_BASED_OUTPATIENT_CLINIC_OR_DEPARTMENT_OTHER): Payer: No Typology Code available for payment source

## 2013-05-24 ENCOUNTER — Ambulatory Visit: Payer: No Typology Code available for payment source | Admitting: Pharmacist

## 2013-05-24 VITALS — BP 108/76 | HR 94 | Temp 98.0°F | Resp 18

## 2013-05-24 DIAGNOSIS — I82409 Acute embolism and thrombosis of unspecified deep veins of unspecified lower extremity: Secondary | ICD-10-CM

## 2013-05-24 DIAGNOSIS — C541 Malignant neoplasm of endometrium: Secondary | ICD-10-CM

## 2013-05-24 DIAGNOSIS — Z5111 Encounter for antineoplastic chemotherapy: Secondary | ICD-10-CM

## 2013-05-24 DIAGNOSIS — C549 Malignant neoplasm of corpus uteri, unspecified: Secondary | ICD-10-CM

## 2013-05-24 LAB — CBC WITH DIFFERENTIAL/PLATELET
BASO%: 0 % (ref 0.0–2.0)
Basophils Absolute: 0 10*3/uL (ref 0.0–0.1)
EOS ABS: 0 10*3/uL (ref 0.0–0.5)
EOS%: 0 % (ref 0.0–7.0)
HCT: 26.7 % — ABNORMAL LOW (ref 34.8–46.6)
HGB: 8.9 g/dL — ABNORMAL LOW (ref 11.6–15.9)
LYMPH#: 0.5 10*3/uL — AB (ref 0.9–3.3)
LYMPH%: 4.6 % — AB (ref 14.0–49.7)
MCH: 30 pg (ref 25.1–34.0)
MCHC: 33.3 g/dL (ref 31.5–36.0)
MCV: 90.3 fL (ref 79.5–101.0)
MONO#: 0.1 10*3/uL (ref 0.1–0.9)
MONO%: 0.7 % (ref 0.0–14.0)
NEUT#: 10.6 10*3/uL — ABNORMAL HIGH (ref 1.5–6.5)
NEUT%: 94.7 % — ABNORMAL HIGH (ref 38.4–76.8)
PLATELETS: 170 10*3/uL (ref 145–400)
RBC: 2.96 10*6/uL — AB (ref 3.70–5.45)
RDW: 18.3 % — ABNORMAL HIGH (ref 11.2–14.5)
WBC: 11.2 10*3/uL — AB (ref 3.9–10.3)

## 2013-05-24 LAB — POCT INR: INR: 2.7

## 2013-05-24 LAB — PROTIME-INR
INR: 2.7 (ref 2.00–3.50)
PROTIME: 32.4 s — AB (ref 10.6–13.4)

## 2013-05-24 MED ORDER — PACLITAXEL CHEMO INJECTION 300 MG/50ML
175.0000 mg/m2 | Freq: Once | INTRAVENOUS | Status: AC
  Administered 2013-05-24: 390 mg via INTRAVENOUS
  Filled 2013-05-24: qty 65

## 2013-05-24 MED ORDER — SODIUM CHLORIDE 0.9 % IJ SOLN
10.0000 mL | INTRAMUSCULAR | Status: DC | PRN
  Administered 2013-05-24: 10 mL
  Filled 2013-05-24: qty 10

## 2013-05-24 MED ORDER — FAMOTIDINE IN NACL 20-0.9 MG/50ML-% IV SOLN
20.0000 mg | Freq: Once | INTRAVENOUS | Status: AC
  Administered 2013-05-24: 20 mg via INTRAVENOUS

## 2013-05-24 MED ORDER — ONDANSETRON 16 MG/50ML IVPB (CHCC)
16.0000 mg | Freq: Once | INTRAVENOUS | Status: AC
  Administered 2013-05-24: 16 mg via INTRAVENOUS

## 2013-05-24 MED ORDER — SODIUM CHLORIDE 0.9 % IV SOLN
Freq: Once | INTRAVENOUS | Status: AC
  Administered 2013-05-24: 10:00:00 via INTRAVENOUS

## 2013-05-24 MED ORDER — SODIUM CHLORIDE 0.9 % IV SOLN
335.1000 mg | Freq: Once | INTRAVENOUS | Status: AC
  Administered 2013-05-24: 340 mg via INTRAVENOUS
  Filled 2013-05-24: qty 34

## 2013-05-24 MED ORDER — DIPHENHYDRAMINE HCL 50 MG/ML IJ SOLN
50.0000 mg | Freq: Once | INTRAMUSCULAR | Status: AC
  Administered 2013-05-24: 50 mg via INTRAVENOUS

## 2013-05-24 MED ORDER — ONDANSETRON 16 MG/50ML IVPB (CHCC)
INTRAVENOUS | Status: AC
  Filled 2013-05-24: qty 16

## 2013-05-24 MED ORDER — DIPHENHYDRAMINE HCL 50 MG/ML IJ SOLN
INTRAMUSCULAR | Status: AC
  Filled 2013-05-24: qty 1

## 2013-05-24 MED ORDER — HEPARIN SOD (PORK) LOCK FLUSH 100 UNIT/ML IV SOLN
500.0000 [IU] | Freq: Once | INTRAVENOUS | Status: AC | PRN
  Administered 2013-05-24: 500 [IU]
  Filled 2013-05-24: qty 5

## 2013-05-24 MED ORDER — FAMOTIDINE IN NACL 20-0.9 MG/50ML-% IV SOLN
INTRAVENOUS | Status: AC
  Filled 2013-05-24: qty 50

## 2013-05-24 MED ORDER — DEXAMETHASONE SODIUM PHOSPHATE 20 MG/5ML IJ SOLN
20.0000 mg | Freq: Once | INTRAMUSCULAR | Status: AC
  Administered 2013-05-24: 20 mg via INTRAVENOUS

## 2013-05-24 MED ORDER — DEXAMETHASONE SODIUM PHOSPHATE 20 MG/5ML IJ SOLN
INTRAMUSCULAR | Status: AC
  Filled 2013-05-24: qty 5

## 2013-05-24 NOTE — Patient Instructions (Signed)
[facility] Discharge Instructions for Patients Receiving Chemotherapy  Today you received the following chemotherapy agents Taxol.carboplatin To help prevent nausea and vomiting after your treatment, we encourage you to take your nausea medication as instructed.  If you develop nausea and vomiting that is not controlled by your nausea medication, call the clinic.   BELOW ARE SYMPTOMS THAT SHOULD BE REPORTED IMMEDIATELY:  *FEVER GREATER THAN 100.5 F  *CHILLS WITH OR WITHOUT FEVER  NAUSEA AND VOMITING THAT IS NOT CONTROLLED WITH YOUR NAUSEA MEDICATION  *UNUSUAL SHORTNESS OF BREATH  *UNUSUAL BRUISING OR BLEEDING  TENDERNESS IN MOUTH AND THROAT WITH OR WITHOUT PRESENCE OF ULCERS  *URINARY PROBLEMS  *BOWEL PROBLEMS  UNUSUAL RASH Items with * indicate a potential emergency and should be followed up as soon as possible.  Feel free to call the clinic you have any questions or concerns. The clinic phone number is [phone].

## 2013-05-24 NOTE — Progress Notes (Signed)
INR at goal of 2-3.  No changes in meds.  No bleeding or bruising.  Dr. Marko Plume had increased coumadin dose to 10mg  daily on 2/23.  Will continue coumadin 10mg  daily, but check PT/INR on 05/29/13 (same day as Neulasta injection)  to ensure INR does not rise above goal.  [name] is receiving chemo today.  Coumadin10mg  #10 samples given: Lot# [id number]/[id number]

## 2013-05-29 ENCOUNTER — Encounter: Payer: Self-pay | Admitting: *Deleted

## 2013-05-29 ENCOUNTER — Other Ambulatory Visit: Payer: BC Managed Care – PPO

## 2013-05-29 ENCOUNTER — Ambulatory Visit (HOSPITAL_BASED_OUTPATIENT_CLINIC_OR_DEPARTMENT_OTHER): Payer: No Typology Code available for payment source | Admitting: Pharmacist

## 2013-05-29 ENCOUNTER — Ambulatory Visit (HOSPITAL_BASED_OUTPATIENT_CLINIC_OR_DEPARTMENT_OTHER): Payer: No Typology Code available for payment source

## 2013-05-29 VITALS — BP 106/61 | HR 104 | Temp 97.9°F

## 2013-05-29 DIAGNOSIS — Z5189 Encounter for other specified aftercare: Secondary | ICD-10-CM

## 2013-05-29 DIAGNOSIS — C549 Malignant neoplasm of corpus uteri, unspecified: Secondary | ICD-10-CM

## 2013-05-29 DIAGNOSIS — I82409 Acute embolism and thrombosis of unspecified deep veins of unspecified lower extremity: Secondary | ICD-10-CM

## 2013-05-29 DIAGNOSIS — C541 Malignant neoplasm of endometrium: Secondary | ICD-10-CM

## 2013-05-29 DIAGNOSIS — I82401 Acute embolism and thrombosis of unspecified deep veins of right lower extremity: Secondary | ICD-10-CM

## 2013-05-29 DIAGNOSIS — Z7901 Long term (current) use of anticoagulants: Secondary | ICD-10-CM

## 2013-05-29 LAB — PROTHROMBIN TIME
INR: 2.44 — ABNORMAL HIGH (ref <1.50)
Prothrombin Time: 25.7 seconds — ABNORMAL HIGH (ref 11.6–15.2)

## 2013-05-29 LAB — POCT INR: INR: 2.44

## 2013-05-29 MED ORDER — PEGFILGRASTIM INJECTION 6 MG/0.6ML
6.0000 mg | Freq: Once | SUBCUTANEOUS | Status: AC
  Administered 2013-05-29: 6 mg via SUBCUTANEOUS
  Filled 2013-05-29: qty 0.6

## 2013-05-29 NOTE — Patient Instructions (Signed)
Continue Coumadin 10mg  daily.   Recheck INR on 3/19; lab at 1:45pm, Flush at 2pm, Dr. Marko Plume at 2:30pm and coumadin clinic at 2:45pm.

## 2013-05-29 NOTE — Progress Notes (Signed)
INR by venopuncture = 2.44 INR within goal today.  No CBC today. No changes in medications. No missed coumadin doses. No real changes in diet. Pt eats less for a day or 2 after each chemotherapy. Pt doing well. No concerns regarding anticoagulation. Will continue Coumadin 10mg  daily.  Recheck INR on 3/19; lab at 1:45pm, Flush at 2pm, Dr. Marko Plume at 2:30pm and coumadin clinic at 2:45pm.  Pt was given Coumadin 5mg  tablets since her INR was a send out. She understands she will take 5mg  (x 2 tablets) for 10mg  daily, once she is out of the 10mg  tablets. Coumadin 5mg  tablets provided (x 20 tablets) Lot:  [id number] Exp:  9/16

## 2013-05-29 NOTE — Patient Instructions (Signed)

## 2013-06-01 ENCOUNTER — Encounter: Payer: Self-pay | Admitting: Oncology

## 2013-06-01 NOTE — Progress Notes (Signed)
Taxol and carboplatin are not replaceable drugs °

## 2013-06-04 ENCOUNTER — Other Ambulatory Visit: Payer: Self-pay | Admitting: Oncology

## 2013-06-08 ENCOUNTER — Ambulatory Visit (HOSPITAL_BASED_OUTPATIENT_CLINIC_OR_DEPARTMENT_OTHER): Payer: No Typology Code available for payment source | Admitting: Oncology

## 2013-06-08 ENCOUNTER — Ambulatory Visit (HOSPITAL_COMMUNITY)
Admission: RE | Admit: 2013-06-08 | Discharge: 2013-06-08 | Disposition: A | Discharge status: Home / Self Care | Payer: No Typology Code available for payment source | Source: Ambulatory Visit | Attending: Oncology | Admitting: Oncology

## 2013-06-08 ENCOUNTER — Encounter: Payer: Self-pay | Admitting: Oncology

## 2013-06-08 ENCOUNTER — Telehealth: Payer: Self-pay | Admitting: Oncology

## 2013-06-08 ENCOUNTER — Other Ambulatory Visit: Payer: Self-pay

## 2013-06-08 ENCOUNTER — Ambulatory Visit: Payer: Self-pay

## 2013-06-08 ENCOUNTER — Ambulatory Visit (HOSPITAL_BASED_OUTPATIENT_CLINIC_OR_DEPARTMENT_OTHER): Payer: No Typology Code available for payment source

## 2013-06-08 ENCOUNTER — Other Ambulatory Visit (HOSPITAL_BASED_OUTPATIENT_CLINIC_OR_DEPARTMENT_OTHER): Payer: No Typology Code available for payment source

## 2013-06-08 ENCOUNTER — Ambulatory Visit: Payer: Self-pay | Admitting: Oncology

## 2013-06-08 ENCOUNTER — Ambulatory Visit: Payer: No Typology Code available for payment source | Admitting: Pharmacist

## 2013-06-08 VITALS — BP 111/70 | HR 96 | Temp 97.7°F

## 2013-06-08 VITALS — BP 113/76 | HR 93 | Temp 97.5°F | Resp 18 | Ht 67.9 in | Wt 204.9 lb

## 2013-06-08 DIAGNOSIS — D649 Anemia, unspecified: Secondary | ICD-10-CM

## 2013-06-08 DIAGNOSIS — I82409 Acute embolism and thrombosis of unspecified deep veins of unspecified lower extremity: Secondary | ICD-10-CM

## 2013-06-08 DIAGNOSIS — C549 Malignant neoplasm of corpus uteri, unspecified: Secondary | ICD-10-CM

## 2013-06-08 DIAGNOSIS — N289 Disorder of kidney and ureter, unspecified: Secondary | ICD-10-CM

## 2013-06-08 DIAGNOSIS — Z95828 Presence of other vascular implants and grafts: Secondary | ICD-10-CM

## 2013-06-08 DIAGNOSIS — D500 Iron deficiency anemia secondary to blood loss (chronic): Secondary | ICD-10-CM

## 2013-06-08 DIAGNOSIS — C541 Malignant neoplasm of endometrium: Secondary | ICD-10-CM

## 2013-06-08 DIAGNOSIS — D6481 Anemia due to antineoplastic chemotherapy: Secondary | ICD-10-CM

## 2013-06-08 DIAGNOSIS — T451X5A Adverse effect of antineoplastic and immunosuppressive drugs, initial encounter: Secondary | ICD-10-CM

## 2013-06-08 LAB — CBC WITH DIFFERENTIAL/PLATELET
BASO%: 0.1 % (ref 0.0–2.0)
BASO%: 0.2 % (ref 0.0–2.0)
BASOS ABS: 0 10*3/uL (ref 0.0–0.1)
Basophils Absolute: 0 10*3/uL (ref 0.0–0.1)
EOS ABS: 0 10*3/uL (ref 0.0–0.5)
EOS%: 0.1 % (ref 0.0–7.0)
EOS%: 0.1 % (ref 0.0–7.0)
Eosinophils Absolute: 0 10*3/uL (ref 0.0–0.5)
HCT: 21.2 % — ABNORMAL LOW (ref 34.8–46.6)
HEMATOCRIT: 19.3 % — AB (ref 34.8–46.6)
HGB: 6.2 g/dL — CL (ref 11.6–15.9)
HGB: 6.9 g/dL — CL (ref 11.6–15.9)
LYMPH#: 1.1 10*3/uL (ref 0.9–3.3)
LYMPH%: 9.2 % — ABNORMAL LOW (ref 14.0–49.7)
LYMPH%: 7.6 % — ABNORMAL LOW (ref 14.0–49.7)
MCH: 29.8 pg (ref 25.1–34.0)
MCH: 30.4 pg (ref 25.1–34.0)
MCHC: 32.1 g/dL (ref 31.5–36.0)
MCHC: 32.3 g/dL (ref 31.5–36.0)
MCV: 92.8 fL (ref 79.5–101.0)
MCV: 94 fL (ref 79.5–101.0)
MONO#: 0.8 10*3/uL (ref 0.1–0.9)
MONO#: 0.8 10*3/uL (ref 0.1–0.9)
MONO%: 6.6 % (ref 0.0–14.0)
MONO%: 5.6 % (ref 0.0–14.0)
NEUT#: 9.7 10*3/uL — ABNORMAL HIGH (ref 1.5–6.5)
NEUT%: 84 % — AB (ref 38.4–76.8)
NEUT%: 86.5 % — ABNORMAL HIGH (ref 38.4–76.8)
NEUTROS ABS: 12 10*3/uL — AB (ref 1.5–6.5)
Platelets: 227 10*3/uL (ref 145–400)
Platelets: 221 10*3/uL (ref 145–400)
RBC: 2.08 10*6/uL — ABNORMAL LOW (ref 3.70–5.45)
RBC: 2.26 10*6/uL — ABNORMAL LOW (ref 3.70–5.45)
RDW: 19.3 % — ABNORMAL HIGH (ref 11.2–14.5)
RDW: 21 % — AB (ref 11.2–14.5)
WBC: 11.6 10*3/uL — AB (ref 3.9–10.3)
WBC: 13.9 10*3/uL — AB (ref 3.9–10.3)
lymph#: 1.1 10*3/uL (ref 0.9–3.3)

## 2013-06-08 LAB — COMPREHENSIVE METABOLIC PANEL (CC13)
ALBUMIN: 3.8 g/dL (ref 3.5–5.0)
ALT: 11 U/L (ref 0–55)
ANION GAP: 10 meq/L (ref 3–11)
AST: 22 U/L (ref 5–34)
Alkaline Phosphatase: 77 U/L (ref 40–150)
BUN: 36.8 mg/dL — ABNORMAL HIGH (ref 7.0–26.0)
CALCIUM: 9.9 mg/dL (ref 8.4–10.4)
CO2: 23 meq/L (ref 22–29)
Chloride: 105 mEq/L (ref 98–109)
Creatinine: 2.2 mg/dL — ABNORMAL HIGH (ref 0.6–1.1)
GLUCOSE: 97 mg/dL (ref 70–140)
POTASSIUM: 4.9 meq/L (ref 3.5–5.1)
SODIUM: 138 meq/L (ref 136–145)
TOTAL PROTEIN: 8.9 g/dL — AB (ref 6.4–8.3)
Total Bilirubin: 0.34 mg/dL (ref 0.20–1.20)

## 2013-06-08 LAB — PROTIME-INR
INR: 2.9 (ref 2.00–3.50)
Protime: 34.8 Seconds — ABNORMAL HIGH (ref 10.6–13.4)

## 2013-06-08 LAB — PREPARE RBC (CROSSMATCH)

## 2013-06-08 LAB — POCT INR: INR: 2.9

## 2013-06-08 MED ORDER — SODIUM CHLORIDE 0.9 % IJ SOLN
10.0000 mL | INTRAMUSCULAR | Status: DC | PRN
  Administered 2013-06-08: 10 mL via INTRAVENOUS
  Filled 2013-06-08: qty 10

## 2013-06-08 MED ORDER — HEPARIN SOD (PORK) LOCK FLUSH 100 UNIT/ML IV SOLN
500.0000 [IU] | Freq: Once | INTRAVENOUS | Status: AC
  Administered 2013-06-08: 500 [IU] via INTRAVENOUS
  Filled 2013-06-08: qty 5
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
## 2013-06-08 NOTE — Progress Notes (Signed)
INR = 2.9    Goal range 2-3 INR is therapeutic. H/H = 6.9/21.2, patient is scheduled for prbcs. No bleeding or bruising noted by patient. No medication changes. Patient will have hysterectomy the last week of April (anticipate 4/28) per Dr. Skeet Latch. I spoke with Dr. Marko Plume, patient will need to have Lovenox around surgery. Raquel Marlon Pel is checking on patient's insurance status, patient has applied for Medicaid. If patient is unable to get Medicaid, we will need to provide Lovenox for her. Continue Coumadin 10mg  daily.  Samples given:  Coumadin 5 mg #60   Lot: [id number]   Exp: 9/16  Recheck INR on 06/22/13; lab at 10:00am, Coumadin clinic at 10:15 and Dr.Brewster at 10:30.  Theone Murdoch, PharmD

## 2013-06-08 NOTE — Telephone Encounter (Signed)
, °

## 2013-06-08 NOTE — Patient Instructions (Signed)
Stop oxycontin 15 mg (long lasting oxycodone, which is used every 12 hours)  Continue oxycodone 10 mg (quick acting oxycodone, which can be used every 4-6 hrs) as needed for pain.

## 2013-06-09 ENCOUNTER — Telehealth: Payer: Self-pay | Admitting: *Deleted

## 2013-06-09 ENCOUNTER — Ambulatory Visit (HOSPITAL_BASED_OUTPATIENT_CLINIC_OR_DEPARTMENT_OTHER): Payer: No Typology Code available for payment source

## 2013-06-09 ENCOUNTER — Telehealth: Payer: Self-pay | Admitting: Oncology

## 2013-06-09 VITALS — BP 98/66 | HR 80 | Temp 96.9°F | Resp 18

## 2013-06-09 DIAGNOSIS — D649 Anemia, unspecified: Secondary | ICD-10-CM

## 2013-06-09 LAB — CA 125: CA 125: 97 U/mL — ABNORMAL HIGH (ref 0.0–30.2)

## 2013-06-09 LAB — HOLD TUBE, BLOOD BANK

## 2013-06-09 MED ORDER — HEPARIN SOD (PORK) LOCK FLUSH 100 UNIT/ML IV SOLN
500.0000 [IU] | Freq: Once | INTRAVENOUS | Status: AC
  Administered 2013-06-09: 500 [IU] via INTRAVENOUS
  Filled 2013-06-09: qty 5

## 2013-06-09 MED ORDER — DIPHENHYDRAMINE HCL 25 MG PO CAPS
ORAL_CAPSULE | ORAL | Status: AC
  Filled 2013-06-09: qty 1

## 2013-06-09 MED ORDER — DIPHENHYDRAMINE HCL 25 MG PO CAPS
25.0000 mg | ORAL_CAPSULE | Freq: Once | ORAL | Status: AC
  Administered 2013-06-09: 25 mg via ORAL

## 2013-06-09 MED ORDER — ACETAMINOPHEN 325 MG PO TABS
ORAL_TABLET | ORAL | Status: AC
  Filled 2013-06-09: qty 2

## 2013-06-09 MED ORDER — ACETAMINOPHEN 325 MG PO TABS
650.0000 mg | ORAL_TABLET | Freq: Once | ORAL | Status: AC
  Administered 2013-06-09: 650 mg via ORAL

## 2013-06-09 MED ORDER — SODIUM CHLORIDE 0.9 % IJ SOLN
10.0000 mL | INTRAMUSCULAR | Status: DC | PRN
  Administered 2013-06-09: 10 mL via INTRAVENOUS
  Filled 2013-06-09: qty 10

## 2013-06-09 MED ORDER — SODIUM CHLORIDE 0.9 % IV SOLN
Freq: Once | INTRAVENOUS | Status: AC
  Administered 2013-06-09: 13:00:00 via INTRAVENOUS

## 2013-06-09 NOTE — Telephone Encounter (Signed)
, °

## 2013-06-09 NOTE — Patient Instructions (Signed)
Blood Transfusion  A blood transfusion replaces your blood or some of its parts. Blood is replaced when you have lost blood because of surgery, an accident, or for severe blood conditions like anemia. You can donate blood to be used on yourself if you have a planned surgery. If you lose blood during that surgery, your own blood can be given back to you. Any blood given to you is checked to make sure it matches your blood type. Your temperature, blood pressure, and heart rate (vital signs) will be checked often.  GET HELP RIGHT AWAY IF:   You feel sick to your stomach (nauseous) or throw up (vomit).  You have watery poop (diarrhea).  You have shortness of breath or trouble breathing.  You have blood in your pee (urine) or have dark colored pee.  You have chest pain or tightness.  Your eyes or skin turn yellow (jaundice).  You have a temperature by mouth above 102 F (38.9 C), not controlled by medicine.  You start to shake and have chills.  You develop a a red rash (hives) or feel itchy.  You develop lightheadedness or feel confused.  You develop back, joint, or muscle pain.  You do not feel hungry (lost appetite).  You feel tired, restless, or nervous.  You develop belly (abdominal) cramps. Document Released: 06/05/2008 Document Revised: 06/01/2011 Document Reviewed: 06/05/2008 Exit[employer] Patient Information 2014 [employer].  

## 2013-06-09 NOTE — Telephone Encounter (Signed)
Per staff message and POF I have scheduled appts.  JMW  

## 2013-06-10 ENCOUNTER — Other Ambulatory Visit: Payer: Self-pay | Admitting: Oncology

## 2013-06-10 DIAGNOSIS — C541 Malignant neoplasm of endometrium: Secondary | ICD-10-CM

## 2013-06-10 DIAGNOSIS — R7989 Other specified abnormal findings of blood chemistry: Secondary | ICD-10-CM

## 2013-06-12 ENCOUNTER — Encounter: Payer: Self-pay | Admitting: Oncology

## 2013-06-12 ENCOUNTER — Telehealth: Payer: Self-pay

## 2013-06-12 NOTE — Progress Notes (Signed)
Called and spoke with DSS/Ms. Noberto Retort, the patient has to be disabled to get medicaid. Her app is pending. I called the patient and she was told could not get disability.I gave her ph for possible insurance and sent her an application for asst. She said was called last week and was told an app was being mailed to her. I told her I would send her one also. See notes from Dr. Berton Mount are trying to get her Lovenox and she will be having surgery.

## 2013-06-12 NOTE — Telephone Encounter (Signed)
Discussed the creatine level as noted below by Dr. Marko Plume.  She will increase fluids to ~10-12 oz every 2 hrs while awake.  She will repeat the b-met tomorrow 06-13-13 prior to Renal US at 1400.

## 2013-06-12 NOTE — Telephone Encounter (Signed)
Message copied by Baruch Merl on Mon Jun 12, 2013 12:55 PM ------      Message from: Gordy Levan      Created: Sat Jun 10, 2013  9:17 PM       Creatinine up to 2.2 last week. She needs repeat BMET week of 3-23 and renal US. I have done the POF now, but RN please call her and let her know why these have been added. She should push fluids, but likely more to the problem than just po intake.      thanks ------

## 2013-06-13 ENCOUNTER — Ambulatory Visit (HOSPITAL_COMMUNITY)
Admission: RE | Admit: 2013-06-13 | Discharge: 2013-06-13 | Disposition: A | Discharge status: Home / Self Care | Payer: No Typology Code available for payment source | Source: Ambulatory Visit | Attending: Oncology | Admitting: Oncology

## 2013-06-13 ENCOUNTER — Ambulatory Visit (HOSPITAL_BASED_OUTPATIENT_CLINIC_OR_DEPARTMENT_OTHER): Payer: No Typology Code available for payment source

## 2013-06-13 ENCOUNTER — Other Ambulatory Visit (HOSPITAL_BASED_OUTPATIENT_CLINIC_OR_DEPARTMENT_OTHER): Payer: No Typology Code available for payment source

## 2013-06-13 VITALS — BP 112/67 | HR 97

## 2013-06-13 DIAGNOSIS — C549 Malignant neoplasm of corpus uteri, unspecified: Secondary | ICD-10-CM

## 2013-06-13 DIAGNOSIS — C541 Malignant neoplasm of endometrium: Secondary | ICD-10-CM

## 2013-06-13 DIAGNOSIS — Z452 Encounter for adjustment and management of vascular access device: Secondary | ICD-10-CM

## 2013-06-13 DIAGNOSIS — R7989 Other specified abnormal findings of blood chemistry: Secondary | ICD-10-CM

## 2013-06-13 DIAGNOSIS — R1909 Other intra-abdominal and pelvic swelling, mass and lump: Secondary | ICD-10-CM | POA: Insufficient documentation

## 2013-06-13 DIAGNOSIS — Z95828 Presence of other vascular implants and grafts: Secondary | ICD-10-CM

## 2013-06-13 LAB — BASIC METABOLIC PANEL (CC13)
Anion Gap: 10 mEq/L (ref 3–11)
BUN: 22.2 mg/dL (ref 7.0–26.0)
CHLORIDE: 104 meq/L (ref 98–109)
CO2: 22 mEq/L (ref 22–29)
CREATININE: 1.4 mg/dL — AB (ref 0.6–1.1)
Calcium: 9.8 mg/dL (ref 8.4–10.4)
Glucose: 100 mg/dl (ref 70–140)
Potassium: 4.3 mEq/L (ref 3.5–5.1)
Sodium: 137 mEq/L (ref 136–145)

## 2013-06-13 MED ORDER — SODIUM CHLORIDE 0.9 % IJ SOLN
10.0000 mL | INTRAMUSCULAR | Status: DC | PRN
  Administered 2013-06-13: 10 mL via INTRAVENOUS
  Filled 2013-06-13: qty 10

## 2013-06-13 MED ORDER — HEPARIN SOD (PORK) LOCK FLUSH 100 UNIT/ML IV SOLN
500.0000 [IU] | Freq: Once | INTRAVENOUS | Status: AC
  Administered 2013-06-13: 500 [IU] via INTRAVENOUS
  Filled 2013-06-13: qty 5

## 2013-06-13 NOTE — Patient Instructions (Signed)

## 2013-06-16 ENCOUNTER — Encounter: Payer: Self-pay | Admitting: Oncology

## 2013-06-16 NOTE — Progress Notes (Signed)
Per [employer]. Patient denied because diagnosis code is off label? They wanted a FDA approved code? I advised him 182.0 was her primary diag code. Will check with Lew to see if any available from him.

## 2013-06-20 LAB — TYPE AND SCREEN
ABO/RH(D): O POS
ANTIBODY SCREEN: POSITIVE
DAT, IGG: NEGATIVE
UNIT DIVISION: 0
Unit division: 0

## 2013-06-21 ENCOUNTER — Telehealth: Payer: Self-pay | Admitting: Gynecologic Oncology

## 2013-06-21 NOTE — Telephone Encounter (Signed)
Office Visit:  GYN ONCOLOGY  Chief Complaint:  Uterine carcinosarcoma  Assessment/Plan  [name] is a 48 y.o. 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 She is S/P IVC filter. [name] has received 3 cycles of therapy and on physical examination there is no evidence of disease in the cervix and no bleeding.  Recommended to the patient is to proceed with cycle #4 of chemotherapy scheduled already for 05/24/2013.Presents for preop for  interval hysterectomy bilateral salpingo-oophorectomy and possible omentectomy  on 07/18/2013.   Prior to surgery she'll need to be transitioned from warfarin back to Lovenox.   A discussion was had with the patient and her family regarding her diagnosis and treatment plan. We discussed that typically, advanced endometrial cancer is not curable, and that the primary treatment would be palliative chemotherapy  And hysterectomy with the goals of chemotherapy to stop progression of disease and attempt to improve symptoms, quality of life and hopefully extend survival. She has a very good grasp on her situation and fully understands her prognosis. We also discussed palliative care and hospice either now or in the future, and again, she verbalized understanding.  She was given the opportunity to ask questions, which were answered to her satisfaction, and she is agreement with the above mentioned plan of care.   History of Present Illness  Caitlyn Higgins is a 48 y.o. who presented with a  pelvic mass, to [facility] and was transferred to [facility] 02/23/2013.   The patient feels that she was in her usual state of health until a few days prior to presentation when she noted abdominal distention and bloating. She presented to her gastroenterologist and was sent for a CT scan.  The CT  02/22/2013 demonstrated " 1. 24.1 x 16.8 x 19.9 cm complex cystic mass which appears to arise from the pelvis extending into the mid upper abdomen is highly suspicious for an ovarian neoplasm. There is evidence of early omental disease, as well as a serosal implant upon the surface of the liver, with small volume of probable malignant ascites, and potential transdiaphragmatic extension based on small lymph nodes immediately above the diaphragm.  2. Importantly, this pelvic mass is causing severe compression of the proximal inferior vena cava, and there is a large volume of deep venous thrombosis in the visualized portion of the right thigh involving right superficial femoral, profunda femoral and common femoral veins. Additionally, there is a mass like peripheral wedge-shaped opacity in the right lower lobe. In the setting of deep venous thrombosis, this wedge-shaped opacity would be most concerning for potential pulmonary infarction. Associated with this, there is a trace right pleural effusion. 3. 5.2 x 1.8 cm heterogeneously enhancing lesion  associated with the liver capsule overlying the right lobe of the  liver "  She was transferred to [facility].    IVC filter was placed and she was started on Lovenox.  She was subsequently transitioned to warfarin.   In December 2014 an endometrial biopsy was collected. Pathology :  1. Cervix, biopsy - HIGH GRADE ENDOMETRIAL CARCINOMA, SEE COMMENT. 2. Endometrium, biopsy - HIGH GRADE ENDOMETRIAL CARCINOMA, SEE COMMENT. Microscopic Comment 1. , 2. In both parts 1 and 2, slide sections demonstrate extensive involvement by high grade endometrial carcinoma demonstrating a biphasic epithelial and stromal architecture. The immunophenotype (strong diffuse estrogen receptor; strong  diffuse vimentin expression; patchy monoclonal CEA expression, patchy p16 immunostain expression, and diffuse mild to moderate p53 expression) is consistent with primary endometrial origin. Although  the mass is best characterized at resection, with this curettage, the tumor is consistent with high grade carcinosarcoma (malignant mixed mullerian tumor) (FIGO grade III).  [name] has received 3 cycles of Taxol carboplatin therapy. Cycle 4 was administered on 05/24/2013.   Past Medical History  Diagnosis Date  . Reflux   . Hypertension   . Cancer     uterine carcinosarcoma   Past Surgical History  Procedure Laterality Date  . No past surgeries       GYN History G0 Hx of Abnormal Pap Smears: no, last pap ~2010  Hx of STIs: no  Never had regular periods.    Family History  family history is not on file.   Review of Systems  A 10 point review of systems is notable for a 25-30 pound weight loss in 2 months prior to her diagnosis. There's been 30 pound weight loss since the visit in December 2014.  Denies cough, reports mild shortness of breath with exertion, no hemoptysis, reports intermittent nausea she reports constipation. Reports fatigue. States that there's been no vaginal bleeding over the last 3 weeks  Physical Exam  BP 124/64  Pulse 108  Temp(Src) 98.2 F (36.8 C) (Oral)  Resp 16  Ht 5' 7.91" (1.725 m)  Wt 205 lb 11.2 oz (93.305 kg)  BMI 31.36 kg/m2 Wt Readings from Last 3 Encounters:  06/08/13 204 lb 14.4 oz (92.942 kg)  05/15/13 211 lb 6.4 oz (95.89 kg)  05/11/13 205 lb 11.2 oz (93.305 kg)   General: No acute distress.  HEENT: Extra occular muscles grossly intact.  Pulmonary: CTAB. Normal work of breathing.  Cardiovascular: RRR.  Abdomen: Firm, moderately distended, non-tender. Pelvic and abdominal mass is palpable.  No rebound/guarding. Nodules from prior Lovenox injections visible and palpable, nontender Musculoskeletal: Grossly normal ROM.  Extremities: Warm and well-perfused. 1+ pitting edema  Neurological: Alert and oriented x3.  Psychological: Appropriate mood/affect.  Genitourinary: Normal external genitalia Bartholin's urethra and  Skene's. No blood is noted within the vaginal vault. The cervix is approximately 3 cm no visible tumor on the cervix. A large multilobulated mass within the pelvis Rectal: Good tone and pelvic masses palpable no intraluminal masses.

## 2013-06-22 ENCOUNTER — Ambulatory Visit: Payer: No Typology Code available for payment source | Attending: Gynecologic Oncology | Admitting: Gynecologic Oncology

## 2013-06-22 ENCOUNTER — Ambulatory Visit: Payer: No Typology Code available for payment source | Admitting: Pharmacist

## 2013-06-22 ENCOUNTER — Other Ambulatory Visit (HOSPITAL_BASED_OUTPATIENT_CLINIC_OR_DEPARTMENT_OTHER): Payer: No Typology Code available for payment source

## 2013-06-22 VITALS — BP 115/69 | HR 95 | Temp 97.9°F | Resp 16 | Ht 67.91 in | Wt 205.7 lb

## 2013-06-22 DIAGNOSIS — D649 Anemia, unspecified: Secondary | ICD-10-CM

## 2013-06-22 DIAGNOSIS — C549 Malignant neoplasm of corpus uteri, unspecified: Secondary | ICD-10-CM

## 2013-06-22 DIAGNOSIS — I82401 Acute embolism and thrombosis of unspecified deep veins of right lower extremity: Secondary | ICD-10-CM

## 2013-06-22 DIAGNOSIS — C541 Malignant neoplasm of endometrium: Secondary | ICD-10-CM

## 2013-06-22 DIAGNOSIS — I82409 Acute embolism and thrombosis of unspecified deep veins of unspecified lower extremity: Secondary | ICD-10-CM

## 2013-06-22 DIAGNOSIS — C55 Malignant neoplasm of uterus, part unspecified: Secondary | ICD-10-CM

## 2013-06-22 LAB — CBC WITH DIFFERENTIAL/PLATELET
BASO%: 0.3 % (ref 0.0–2.0)
Basophils Absolute: 0 10*3/uL (ref 0.0–0.1)
EOS ABS: 0 10*3/uL (ref 0.0–0.5)
EOS%: 0.2 % (ref 0.0–7.0)
HCT: 25.9 % — ABNORMAL LOW (ref 34.8–46.6)
HGB: 8.7 g/dL — ABNORMAL LOW (ref 11.6–15.9)
LYMPH%: 12.2 % — AB (ref 14.0–49.7)
MCH: 31.3 pg (ref 25.1–34.0)
MCHC: 33.5 g/dL (ref 31.5–36.0)
MCV: 93.3 fL (ref 79.5–101.0)
MONO#: 0.6 10*3/uL (ref 0.1–0.9)
MONO%: 10 % (ref 0.0–14.0)
NEUT#: 4.7 10*3/uL (ref 1.5–6.5)
NEUT%: 77.3 % — ABNORMAL HIGH (ref 38.4–76.8)
PLATELETS: 232 10*3/uL (ref 145–400)
RBC: 2.77 10*6/uL — ABNORMAL LOW (ref 3.70–5.45)
RDW: 19.7 % — AB (ref 11.2–14.5)
WBC: 6.1 10*3/uL (ref 3.9–10.3)
lymph#: 0.7 10*3/uL — ABNORMAL LOW (ref 0.9–3.3)

## 2013-06-22 LAB — POCT INR: INR: 2.6

## 2013-06-22 LAB — PROTIME-INR
INR: 2.6 (ref 2.00–3.50)
PROTIME: 31.2 s — AB (ref 10.6–13.4)

## 2013-06-22 NOTE — Progress Notes (Signed)
INR within goal today. Hg/Hct: 8.7/25.9, Pltc = 232  Pt doing well. No problems to report regarding anticoagulation. No concerns regarding anticoagulation today.  No changes in diet or medications. No missed coumadin doses. Pt to have surgery on 07/18/13. Will f/u with Dr. Marko Plume regarding specific anticoagulation plan. Pt will need Lovenox around surgery. We can provide anticoagulation plan and Lovenox samples at next visit (if needed). Pt will have surgery on Tuesday (4/28) and likely go home on Friday (May 1). Will continue Coumadin 10mg  daily.   Recheck INR on 07/12/13; lab at 10:30am and Coumadin clinic at 10:45am

## 2013-06-22 NOTE — Patient Instructions (Signed)
Continue Coumadin 10mg  daily.   Recheck INR on 07/12/13; lab at 10:30am and Coumadin clinic at 10:45am.

## 2013-06-23 NOTE — Progress Notes (Signed)
Office Visit:  GYN ONCOLOGY  Chief Complaint:  Uterine carcinosarcoma  Assessment/Plan  [name] is a 48 y.o. 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 She is S/P IVC filter. [name] has received 4 cycles of chemtherapy and on physical examination there is no evidence of disease in the cervix and no bleeding.  Caitlyn Higgins presents for preop for  interval hysterectomy bilateral salpingo-oophorectomy and possible omentectomy  on 07/18/2013.    A discussion was had with the patient and her family regarding her diagnosis and treatment plan. We discussed that typically, advanced endometrial cancer is not curable, and that the primary treatment would be palliative chemotherapy with hysterectomy with the goals of chemotherapy to stop progression of disease and attempt to improve symptoms, quality of life and hopefully extend survival.  Hysterectomy will decrease heavy vaginal bleeding and allow for better genital hygiene. She has a very good grasp on her situation and fully understands her prognosis. We also discussed palliative care and hospice either now or in the future, and again, she verbalized understanding.  She was given the opportunity to ask questions, which were answered to her satisfaction, and she is agreement with the above mentioned plan of care.  Prior to surgery she'll need to be transitioned from warfarin back to Lovenox.   [name] was provided referral information for a financial counselor for assistance with the treatment bills given her uninsured status.Marland Kitchen    History of Present Illness  Caitlyn Higgins is a 48 y.o. who presented with a  pelvic mass, to [facility] and was transferred to [facility] 02/23/2013.   The patient feels that she was in her usual state of health  until a few days prior to presentation when she noted abdominal distention and bloating. She presented to her gastroenterologist and was sent for a CT scan.  The CT 02/22/2013 demonstrated " 1. 24.1 x 16.8 x 19.9 cm complex cystic mass which appears to arise from the pelvis extending into the mid upper abdomen is highly suspicious for an ovarian neoplasm. There is evidence of early omental disease, as well as a serosal implant upon the surface of the liver, with small volume of probable malignant ascites, and potential transdiaphragmatic extension based on small lymph nodes immediately above the diaphragm.  2. Importantly, this pelvic mass is causing severe compression of the proximal inferior vena cava, and there is a large volume of deep venous thrombosis in the visualized portion of the right thigh involving right superficial femoral, profunda femoral and common femoral veins. Additionally, there is a mass like peripheral wedge-shaped opacity in the right lower lobe. In the setting of deep venous thrombosis, this wedge-shaped opacity would be most concerning for potential pulmonary infarction. Associated with this, there is a trace right pleural effusion. 3. 5.2 x 1.8 cm heterogeneously enhancing lesion  associated with the liver capsule overlying the right lobe of the  liver "  She was transferred to [facility].    IVC filter was placed and she was started on Lovenox.  She was subsequently transitioned to warfarin.   In December 2014 an endometrial biopsy was collected. Pathology :  1. Cervix, biopsy - HIGH GRADE ENDOMETRIAL CARCINOMA, SEE COMMENT. 2. Endometrium, biopsy - HIGH GRADE ENDOMETRIAL CARCINOMA, SEE COMMENT. Microscopic Comment 1. , 2. In both parts 1 and  2, slide sections demonstrate extensive involvement by high grade endometrial carcinoma demonstrating a biphasic epithelial and stromal architecture. The immunophenotype (strong diffuse estrogen receptor; strong diffuse vimentin expression;  patchy monoclonal CEA expression, patchy p16 immunostain expression, and diffuse mild to moderate p53 expression) is consistent with primary endometrial origin. 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  [name] has received the 4th cycle of Taxol carboplatin therapy on 05/24/2013.   Past Medical History  Diagnosis Date  . Reflux   . Hypertension   . Cancer     uterine carcinosarcoma   Past Surgical History  Procedure Laterality Date  . No past surgeries       GYN History G0 Hx of Abnormal Pap Smears: no, last pap ~2010  Hx of STIs: no  Never had regular periods.    Family History  family history is not on file.   Review of Systems   There's been 30 pound weight loss since the visit in December 2014.  Denies cough, reports improvement mild shortness of breath with exertion, no hemoptysis, reports intermittent nausea she reports constipation. Reports fatigue. States that there's been no vaginal bleeding for almost two months Physical Exam  BP 115/69  Pulse 95  Temp(Src) 97.9 F (36.6 C) (Oral)  Resp 16  Ht 5' 7.91" (1.725 m)  Wt 205 lb 11.2 oz (93.305 kg)  BMI 31.36 kg/m2 Wt Readings from Last 3 Encounters:  06/22/13 205 lb 11.2 oz (93.305 kg)  06/08/13 204 lb 14.4 oz (92.942 kg)  05/15/13 211 lb 6.4 oz (95.89 kg)   General: No acute distress.  HEENT: Extra occular muscles grossly intact.  Pulmonary: CTAB. Normal work of breathing.  Cardiovascular: RRR.  Abdomen: Firm, moderately distended, non-tender. Pelvic and abdominal mass is palpable extending above the umbilicus.  No rebound/guarding.  Musculoskeletal: Grossly normal ROM.  Extremities: Warm and well-perfused. 1+ pitting edema  Neurological: Alert and oriented x3.  Psychological: Appropriate mood/affect.  Genitourinary: Normal external genitalia Bartholin's  urethra and Skene's. No blood is noted within the vaginal vault. The cervix is approximately 3 cm no visible tumor on the cervix. A large multilobulated mass within the pelvis Rectal: Good tone and pelvic masses palpable no intraluminal masses.

## 2013-06-26 ENCOUNTER — Telehealth: Payer: Self-pay | Admitting: Dietician

## 2013-06-26 NOTE — Telephone Encounter (Signed)
Brief Outpatient Oncology Nutrition Note  Patient has been identified to be at risk on malnutrition screen.  Wt Readings from Last 10 Encounters:  06/22/13 205 lb 11.2 oz (93.305 kg)  06/08/13 204 lb 14.4 oz (92.942 kg)  05/15/13 211 lb 6.4 oz (95.89 kg)  05/11/13 205 lb 11.2 oz (93.305 kg)  04/19/13 211 lb 3.2 oz (95.8 kg)  04/10/13 210 lb 11.2 oz (95.573 kg)  03/29/13 220 lb 9.6 oz (100.064 kg)  03/21/13 222 lb 6 oz (100.869 kg)  03/14/13 230 lb 4.8 oz (104.463 kg)  03/09/13 231 lb 4.8 oz (104.917 kg)    Dx:  Endometrial Cancer with mets.  Patient of Dr. Marko Plume.  For hysterectomy at the end of the month.  Called patient due to continued weight loss.  Patient was last seen by the [place] RD 04/04/13.  She met the criteria for severe malnutrition in December.  She has continued to lose weight since that time.  Patient is currently not available.    [place] RD available as needed.  Antonieta Iba, RD, LDN

## 2013-07-05 ENCOUNTER — Encounter (HOSPITAL_COMMUNITY): Payer: Self-pay | Admitting: Pharmacy Technician

## 2013-07-10 ENCOUNTER — Telehealth: Payer: Self-pay

## 2013-07-10 ENCOUNTER — Telehealth: Payer: Self-pay | Admitting: *Deleted

## 2013-07-10 DIAGNOSIS — C541 Malignant neoplasm of endometrium: Secondary | ICD-10-CM

## 2013-07-10 DIAGNOSIS — D649 Anemia, unspecified: Secondary | ICD-10-CM

## 2013-07-10 DIAGNOSIS — I82409 Acute embolism and thrombosis of unspecified deep veins of unspecified lower extremity: Secondary | ICD-10-CM

## 2013-07-10 MED ORDER — OXYCODONE HCL ER 15 MG PO T12A: 15.0000 mg | EXTENDED_RELEASE_TABLET | Freq: Two times a day (BID) | ORAL | Status: DC

## 2013-07-10 MED ORDER — ALLOPURINOL 100 MG PO TABS: 100.0000 mg | ORAL_TABLET | Freq: Every day | ORAL | Status: DC

## 2013-07-10 MED ORDER — OXYCODONE HCL 10 MG PO TABS: ORAL_TABLET | ORAL | Status: DC

## 2013-07-10 MED ORDER — FERROUS FUMARATE 325 (106 FE) MG PO TABS: 1.0000 | ORAL_TABLET | Freq: Every day | ORAL | Status: DC

## 2013-07-10 NOTE — Telephone Encounter (Signed)
[name] will pick up Oxycontin and Oxycodone prescriptions when she comes 07-12-13 for coumadin clinic.  Prescriptions are in prescription book  with the injection nurse. Allopurinol and Iron refills went to [facility] out patient pharmacy.

## 2013-07-10 NOTE — Telephone Encounter (Signed)
Call from [facility] in hospital accounts [place] pt surgery 4/28- rejected by insurance co- pt's insurance is not effective until Jul 21 2013. Pt has requested to move surgery date for later time.  Surgery moved from 4/28 to 5/12 due to insurance coverage. Call to scheduling, changed surgery date to 5/12. Called pt to confirm surgery moved to 5/12, pre-op will be calling pt with new appt date/time

## 2013-07-11 ENCOUNTER — Encounter: Payer: Self-pay | Admitting: Pharmacist

## 2013-07-11 NOTE — Progress Notes (Unsigned)
Anticoagulation plan for surgery on 5/12 - per Dr. Mindi Slicker.  5/6 - Take last dose of coumadin  5/7-5/11 - Hold coumadin (5 days prior to surgery)  5/9 & 5/10 - Lovenox 1.5mg /kg (140mg ) SQ daily. Take Lovenox in the evening.  (3/24 Scr = 1.4, CrCl = 71, 4/2 Pltc = 232)  5/12 - Hold Coumadin  5/12 - Surgery  Anticoagulation plans after surgery will be determined in hospital after surgery.  Pt will need to pick up Lovenox samples at next visit on 07/12/13

## 2013-07-12 ENCOUNTER — Inpatient Hospital Stay (HOSPITAL_COMMUNITY): Admission: RE | Admit: 2013-07-12 | Payer: Self-pay | Source: Ambulatory Visit

## 2013-07-12 ENCOUNTER — Ambulatory Visit (HOSPITAL_BASED_OUTPATIENT_CLINIC_OR_DEPARTMENT_OTHER): Payer: No Typology Code available for payment source | Admitting: Pharmacist

## 2013-07-12 ENCOUNTER — Other Ambulatory Visit (HOSPITAL_BASED_OUTPATIENT_CLINIC_OR_DEPARTMENT_OTHER): Payer: No Typology Code available for payment source

## 2013-07-12 DIAGNOSIS — I82409 Acute embolism and thrombosis of unspecified deep veins of unspecified lower extremity: Secondary | ICD-10-CM

## 2013-07-12 DIAGNOSIS — Z7901 Long term (current) use of anticoagulants: Secondary | ICD-10-CM

## 2013-07-12 DIAGNOSIS — I82401 Acute embolism and thrombosis of unspecified deep veins of right lower extremity: Secondary | ICD-10-CM

## 2013-07-12 LAB — PROTIME-INR
INR: 3.7 — ABNORMAL HIGH (ref 2.00–3.50)
PROTIME: 44.4 s — AB (ref 10.6–13.4)

## 2013-07-12 LAB — POCT INR: INR: 3.7

## 2013-07-12 NOTE — Progress Notes (Signed)
INR elevated above goal today.  Caitlyn Higgins has no changes in medications or diet.  No bleeding or unusual bruising.  She continues to take coumadin 10mg  daily.  I have instructed Caitlyn Higgins to hold coumadin today then resume 10mg  daily.  Will check PT/INR in 1 week.  If still elevated would decrease coumadin dose.    I have given Caitlyn Higgins anticoagulation instructions for upcoming surgery (see pt instructions in this encounter).  Lovenox 150mg  x 2 syringes samples given to Caitlyn Higgins.  Please reiterate instructions on next week coumadin clinic visit.

## 2013-07-12 NOTE — Patient Instructions (Signed)
5/6 - Take last dose of coumadin  5/7-5/11 - Hold coumadin (5 days prior to surgery)  5/9 & 5/10 - Lovenox 1.5mg /kg (140mg ) SQ daily. Take Lovenox in the evening.  (3/24 Scr = 1.4, CrCl = 71, 4/2 Pltc = 232)  5/12 - Hold Coumadin  5/12 - Surgery  Anticoagulation plans after surgery will be determined in hospital after surgery.

## 2013-07-18 ENCOUNTER — Other Ambulatory Visit (HOSPITAL_BASED_OUTPATIENT_CLINIC_OR_DEPARTMENT_OTHER): Payer: No Typology Code available for payment source

## 2013-07-18 ENCOUNTER — Ambulatory Visit (HOSPITAL_BASED_OUTPATIENT_CLINIC_OR_DEPARTMENT_OTHER): Payer: No Typology Code available for payment source | Admitting: Pharmacist

## 2013-07-18 DIAGNOSIS — I82409 Acute embolism and thrombosis of unspecified deep veins of unspecified lower extremity: Secondary | ICD-10-CM

## 2013-07-18 DIAGNOSIS — I82401 Acute embolism and thrombosis of unspecified deep veins of right lower extremity: Secondary | ICD-10-CM

## 2013-07-18 LAB — POCT INR: INR: 3

## 2013-07-18 LAB — PROTIME-INR
INR: 3 (ref 2.00–3.50)
Protime: 36 Seconds — ABNORMAL HIGH (ref 10.6–13.4)

## 2013-07-18 NOTE — Patient Instructions (Signed)
5/6 - Take last dose of coumadin  5/7-5/11 - Hold coumadin (5 days prior to surgery)  5/9 & 5/10 - Lovenox 1.5mg/kg (140mg) SQ daily. Take Lovenox in the evening.  (3/24 Scr = 1.4, CrCl = 71, 4/2 Pltc = 232)  5/12 - Hold Coumadin  5/12 - Surgery  Anticoagulation plans after surgery will be determined in hospital after surgery.     

## 2013-07-18 NOTE — Progress Notes (Signed)
INR = 3  Pt is on Coumadin 10 mg daily.  She held her Coumadin as instructed last week & her INR went back to goal nicely. She had no bleeding.   She fell about 1 month ago & has a residual "knot" on her R inner knee.  It is not swollen/tender/red today. Pt is scheduled for surgery (TAH) 08/01/13.  We went over her anticoag plan today in detail: 5/6 - Take last dose of coumadin  5/7-5/11 - Hold coumadin (5 days prior to surgery)  5/9 & 5/10 - Lovenox 1.5mg /kg (140mg ) SQ daily. Take Lovenox in the evening. She understands she will only use a portion of the Lovenox 150 mg syringes she has for each dose. (3/24 Scr = 1.4, CrCl = 71, 4/2 Pltc = 232)  5/12 - Hold Coumadin  5/12 - Surgery  Anticoagulation plans after surgery will be determined in hospital after surgery. If all goes well post-op, she is scheduled to be discharged on 08/04/13 & I expect Dr. Marko Plume will want another protime approx 08/09/13.  I will await further instructions from MD & have not scheduled her next Coumadin clinic appt yet. [name], Pharm.D., CPP 07/18/2013@10 :37 AM

## 2013-07-26 ENCOUNTER — Encounter (HOSPITAL_COMMUNITY)
Admission: RE | Admit: 2013-07-26 | Discharge: 2013-07-26 | Disposition: A | Discharge status: Home / Self Care | Payer: No Typology Code available for payment source | Source: Ambulatory Visit | Attending: Obstetrics & Gynecology | Admitting: Obstetrics & Gynecology

## 2013-07-26 ENCOUNTER — Encounter (HOSPITAL_COMMUNITY): Payer: Self-pay

## 2013-07-26 ENCOUNTER — Ambulatory Visit (HOSPITAL_COMMUNITY)
Admission: RE | Admit: 2013-07-26 | Discharge: 2013-07-26 | Disposition: A | Discharge status: Home / Self Care | Payer: No Typology Code available for payment source | Source: Ambulatory Visit | Attending: Gynecologic Oncology | Admitting: Gynecologic Oncology

## 2013-07-26 DIAGNOSIS — Z01812 Encounter for preprocedural laboratory examination: Secondary | ICD-10-CM | POA: Insufficient documentation

## 2013-07-26 DIAGNOSIS — Z9289 Personal history of other medical treatment: Secondary | ICD-10-CM

## 2013-07-26 DIAGNOSIS — C55 Malignant neoplasm of uterus, part unspecified: Secondary | ICD-10-CM | POA: Insufficient documentation

## 2013-07-26 DIAGNOSIS — Z3202 Encounter for pregnancy test, result negative: Secondary | ICD-10-CM | POA: Insufficient documentation

## 2013-07-26 HISTORY — DX: Hyperuricemia without signs of inflammatory arthritis and tophaceous disease: E79.0

## 2013-07-26 HISTORY — DX: Personal history of other medical treatment: Z92.89

## 2013-07-26 HISTORY — DX: Acute embolism and thrombosis of unspecified deep veins of unspecified lower extremity: I82.409

## 2013-07-26 HISTORY — DX: Anemia, unspecified: D64.9

## 2013-07-26 HISTORY — DX: Other pulmonary embolism without acute cor pulmonale: I26.99

## 2013-07-26 HISTORY — DX: Gastro-esophageal reflux disease without esophagitis: K21.9

## 2013-07-26 LAB — CBC WITH DIFFERENTIAL/PLATELET
Basophils Absolute: 0 10*3/uL (ref 0.0–0.1)
Basophils Relative: 0 % (ref 0–1)
Eosinophils Absolute: 0 10*3/uL (ref 0.0–0.7)
Eosinophils Relative: 0 % (ref 0–5)
HEMATOCRIT: 24.4 % — AB (ref 36.0–46.0)
HEMOGLOBIN: 8 g/dL — AB (ref 12.0–15.0)
LYMPHS ABS: 0.9 10*3/uL (ref 0.7–4.0)
Lymphocytes Relative: 18 % (ref 12–46)
MCH: 31.4 pg (ref 26.0–34.0)
MCHC: 32.8 g/dL (ref 30.0–36.0)
MCV: 95.7 fL (ref 78.0–100.0)
MONOS PCT: 5 % (ref 3–12)
Monocytes Absolute: 0.3 10*3/uL (ref 0.1–1.0)
NEUTROS ABS: 3.8 10*3/uL (ref 1.7–7.7)
NEUTROS PCT: 76 % (ref 43–77)
Platelets: 249 10*3/uL (ref 150–400)
RBC: 2.55 MIL/uL — AB (ref 3.87–5.11)
RDW: 17.7 % — ABNORMAL HIGH (ref 11.5–15.5)
WBC: 4.9 10*3/uL (ref 4.0–10.5)

## 2013-07-26 LAB — PROTIME-INR
INR: 3.08 — ABNORMAL HIGH (ref 0.00–1.49)
Prothrombin Time: 30.7 seconds — ABNORMAL HIGH (ref 11.6–15.2)

## 2013-07-26 LAB — COMPREHENSIVE METABOLIC PANEL
ALT: 7 U/L (ref 0–35)
AST: 16 U/L (ref 0–37)
Albumin: 3.9 g/dL (ref 3.5–5.2)
Alkaline Phosphatase: 57 U/L (ref 39–117)
BILIRUBIN TOTAL: 0.6 mg/dL (ref 0.3–1.2)
BUN: 33 mg/dL — AB (ref 6–23)
CHLORIDE: 103 meq/L (ref 96–112)
CO2: 29 mEq/L (ref 19–32)
Calcium: 9.6 mg/dL (ref 8.4–10.5)
Creatinine, Ser: 1.29 mg/dL — ABNORMAL HIGH (ref 0.50–1.10)
GFR, EST AFRICAN AMERICAN: 55 mL/min — AB (ref >90)
GFR, EST NON AFRICAN AMERICAN: 48 mL/min — AB (ref >90)
GLUCOSE: 100 mg/dL — AB (ref 70–99)
Potassium: 4.4 mEq/L (ref 3.7–5.3)
Sodium: 140 mEq/L (ref 137–147)
Total Protein: 9.1 g/dL — ABNORMAL HIGH (ref 6.0–8.3)

## 2013-07-26 LAB — HCG, SERUM, QUALITATIVE: PREG SERUM: NEGATIVE

## 2013-07-26 LAB — APTT: aPTT: 61 seconds — ABNORMAL HIGH (ref 24–37)

## 2013-07-26 NOTE — Patient Instructions (Signed)
[place]  07/26/2013   Your procedure is scheduled on:  5-12 -2015  Report to [facility] at     1100  AM.  Call this number if you have problems the morning of surgery: [phone]  Or Presurgical Testing [phone](Onyx Schirmer) For Living Will and/or Health Care Power Attorney Forms: please provide copy for your medical record,may bring AM of surgery(Forms should be already notarized -we do not provide this service).( 07-26-13 no further information desired today)  Remember: Follow any bowel prep instructions per MD office.(Clear Liquids X 24 hours prior to surgery  Start 0700 AM 07-31-13 until 0700 08-01-13, then nothing after except water to take meds.    Do not eat food:After Midnight.    Except as stated above. Follow Clear liquids  Diet.  Take these medicines the morning of surgery with A SIP OF WATER: Allopurinol. Loratadine. Omeprazole. Oxycodone.   Do not wear jewelry, make-up or nail polish.  Do not wear lotions, powders, or perfumes. You may wear deodorant.  Do not shave 48 hours(2 days) prior to first CHG shower(legs and under arms).(Shaving face and neck okay.)  Do not bring valuables to the hospital.(Hospital is not responsible for lost valuables).  Contacts, dentures or removable bridgework, body piercing, hair pins may not be worn into surgery.  Leave suitcase in the car. After surgery it may be brought to your room.  For patients admitted to the hospital, checkout time is 11:00 AM the day of discharge.(Restricted visitors-Any Persons displaying flu-like symptoms or illness).    Patients discharged the day of surgery will not be allowed to drive home. Must have responsible person with you x 24 hours once discharged.  Name and phone number of your driver: Cruz Condon  Special Instructions: CHG(Chlorhedine 4%-"Hibiclens","Betasept","Aplicare") Shower Use Special Wash: see special instructions.(avoid face and genitals)   Please read over the  following fact sheets that you were given Blood Transfusion fact sheet, Incentive Spirometry Instruction.  Remember : Type/Screen "Blue armbands" - may not be removed once applied(would result in being retested AM of surgery, if removed).  Failure to follow these instructions may result in Cancellation of your surgery.   Patient signature_______________________________________________________

## 2013-07-27 LAB — URINALYSIS, ROUTINE W REFLEX MICROSCOPIC
BILIRUBIN URINE: NEGATIVE
GLUCOSE, UA: NEGATIVE mg/dL
Hgb urine dipstick: NEGATIVE
KETONES UR: NEGATIVE mg/dL
LEUKOCYTES UA: NEGATIVE
Nitrite: NEGATIVE
PH: 5.5 (ref 5.0–8.0)
Protein, ur: 30 mg/dL — AB
Specific Gravity, Urine: 1.021 (ref 1.005–1.030)
Urobilinogen, UA: 0.2 mg/dL (ref 0.0–1.0)

## 2013-07-27 LAB — URINE MICROSCOPIC-ADD ON

## 2013-07-27 NOTE — Pre-Procedure Instructions (Signed)
07-26-13 EKG 12'14-Epic. CXR done today . 07-27-13 1320 Call to inform Gyn/ONCology labs viewable in Epic-spoke to "KIM".

## 2013-07-28 ENCOUNTER — Telehealth: Payer: Self-pay | Admitting: *Deleted

## 2013-07-28 NOTE — Telephone Encounter (Signed)
Called pt lmovm per MD " nothing has changed on CT of Chest" Requested pt to call back with any concerns.

## 2013-08-01 ENCOUNTER — Inpatient Hospital Stay (HOSPITAL_COMMUNITY): Payer: No Typology Code available for payment source | Admitting: Certified Registered Nurse Anesthetist

## 2013-08-01 ENCOUNTER — Inpatient Hospital Stay (HOSPITAL_COMMUNITY)
Admission: RE | Admit: 2013-08-01 | Discharge: 2013-08-04 | DRG: 739 | Disposition: A | Discharge status: Home / Self Care | Payer: No Typology Code available for payment source | Source: Ambulatory Visit | Attending: Obstetrics & Gynecology | Admitting: Obstetrics & Gynecology

## 2013-08-01 ENCOUNTER — Encounter (HOSPITAL_COMMUNITY): Payer: No Typology Code available for payment source | Admitting: Certified Registered Nurse Anesthetist

## 2013-08-01 ENCOUNTER — Encounter (HOSPITAL_COMMUNITY)
Admission: RE | Disposition: A | Discharge status: Home / Self Care | Payer: Self-pay | Source: Ambulatory Visit | Attending: Obstetrics & Gynecology

## 2013-08-01 ENCOUNTER — Encounter (HOSPITAL_COMMUNITY): Payer: Self-pay | Admitting: *Deleted

## 2013-08-01 DIAGNOSIS — Z7901 Long term (current) use of anticoagulants: Secondary | ICD-10-CM

## 2013-08-01 DIAGNOSIS — I82220 Acute embolism and thrombosis of inferior vena cava: Secondary | ICD-10-CM | POA: Diagnosis present

## 2013-08-01 DIAGNOSIS — C771 Secondary and unspecified malignant neoplasm of intrathoracic lymph nodes: Secondary | ICD-10-CM | POA: Diagnosis present

## 2013-08-01 DIAGNOSIS — K219 Gastro-esophageal reflux disease without esophagitis: Secondary | ICD-10-CM | POA: Diagnosis present

## 2013-08-01 DIAGNOSIS — C787 Secondary malignant neoplasm of liver and intrahepatic bile duct: Secondary | ICD-10-CM | POA: Diagnosis present

## 2013-08-01 DIAGNOSIS — C541 Malignant neoplasm of endometrium: Secondary | ICD-10-CM | POA: Diagnosis present

## 2013-08-01 DIAGNOSIS — Z86711 Personal history of pulmonary embolism: Secondary | ICD-10-CM

## 2013-08-01 DIAGNOSIS — I82409 Acute embolism and thrombosis of unspecified deep veins of unspecified lower extremity: Secondary | ICD-10-CM | POA: Diagnosis present

## 2013-08-01 DIAGNOSIS — D649 Anemia, unspecified: Secondary | ICD-10-CM | POA: Diagnosis present

## 2013-08-01 DIAGNOSIS — C569 Malignant neoplasm of unspecified ovary: Secondary | ICD-10-CM

## 2013-08-01 DIAGNOSIS — N289 Disorder of kidney and ureter, unspecified: Secondary | ICD-10-CM | POA: Diagnosis present

## 2013-08-01 DIAGNOSIS — C549 Malignant neoplasm of corpus uteri, unspecified: Principal | ICD-10-CM | POA: Diagnosis present

## 2013-08-01 DIAGNOSIS — C55 Malignant neoplasm of uterus, part unspecified: Secondary | ICD-10-CM | POA: Diagnosis present

## 2013-08-01 DIAGNOSIS — E43 Unspecified severe protein-calorie malnutrition: Secondary | ICD-10-CM | POA: Diagnosis present

## 2013-08-01 DIAGNOSIS — R180 Malignant ascites: Secondary | ICD-10-CM | POA: Diagnosis present

## 2013-08-01 DIAGNOSIS — C7960 Secondary malignant neoplasm of unspecified ovary: Secondary | ICD-10-CM | POA: Diagnosis present

## 2013-08-01 DIAGNOSIS — Z6831 Body mass index (BMI) 31.0-31.9, adult: Secondary | ICD-10-CM

## 2013-08-01 DIAGNOSIS — I10 Essential (primary) hypertension: Secondary | ICD-10-CM | POA: Diagnosis present

## 2013-08-01 HISTORY — PX: SALPINGOOPHORECTOMY: SHX82

## 2013-08-01 HISTORY — PX: ABDOMINAL HYSTERECTOMY: SHX81

## 2013-08-01 LAB — PROTIME-INR
INR: 1.28 (ref 0.00–1.49)
Prothrombin Time: 15.7 seconds — ABNORMAL HIGH (ref 11.6–15.2)

## 2013-08-01 SURGERY — HYSTERECTOMY, ABDOMINAL
Anesthesia: General | Site: Abdomen

## 2013-08-01 MED ORDER — FENTANYL CITRATE 0.05 MG/ML IJ SOLN
INTRAMUSCULAR | Status: AC
  Filled 2013-08-01: qty 5

## 2013-08-01 MED ORDER — CEFAZOLIN SODIUM-DEXTROSE 2-3 GM-% IV SOLR
2.0000 g | INTRAVENOUS | Status: AC
  Administered 2013-08-01: 2 g via INTRAVENOUS

## 2013-08-01 MED ORDER — PROPOFOL 10 MG/ML IV BOLUS
INTRAVENOUS | Status: AC
  Filled 2013-08-01: qty 20

## 2013-08-01 MED ORDER — FENTANYL CITRATE 0.05 MG/ML IJ SOLN
INTRAMUSCULAR | Status: DC | PRN
  Administered 2013-08-01 (×5): 50 ug via INTRAVENOUS

## 2013-08-01 MED ORDER — GLYCOPYRROLATE 0.2 MG/ML IJ SOLN
INTRAMUSCULAR | Status: DC | PRN
  Administered 2013-08-01: 0.6 mg via INTRAVENOUS

## 2013-08-01 MED ORDER — ONDANSETRON HCL 4 MG/2ML IJ SOLN
INTRAMUSCULAR | Status: AC
  Filled 2013-08-01: qty 2

## 2013-08-01 MED ORDER — HYDROMORPHONE HCL PF 1 MG/ML IJ SOLN
INTRAMUSCULAR | Status: DC | PRN
  Administered 2013-08-01 (×2): 1 mg via INTRAVENOUS

## 2013-08-01 MED ORDER — METOCLOPRAMIDE HCL 5 MG/ML IJ SOLN
INTRAMUSCULAR | Status: DC | PRN
  Administered 2013-08-01: 10 mg via INTRAVENOUS

## 2013-08-01 MED ORDER — CEFAZOLIN SODIUM-DEXTROSE 2-3 GM-% IV SOLR
INTRAVENOUS | Status: AC
  Filled 2013-08-01: qty 50

## 2013-08-01 MED ORDER — GLYCOPYRROLATE 0.2 MG/ML IJ SOLN
INTRAMUSCULAR | Status: AC
  Filled 2013-08-01: qty 3

## 2013-08-01 MED ORDER — HYDROMORPHONE HCL PF 1 MG/ML IJ SOLN
0.2500 mg | INTRAMUSCULAR | Status: DC | PRN
  Administered 2013-08-01 (×2): 0.25 mg via INTRAVENOUS
  Administered 2013-08-01: 0.5 mg via INTRAVENOUS
  Administered 2013-08-01 (×2): 0.25 mg via INTRAVENOUS
  Administered 2013-08-01: 0.5 mg via INTRAVENOUS

## 2013-08-01 MED ORDER — IRBESARTAN 150 MG PO TABS
150.0000 mg | ORAL_TABLET | Freq: Every day | ORAL | Status: DC
  Filled 2013-08-01: qty 1

## 2013-08-01 MED ORDER — NEOSTIGMINE METHYLSULFATE 10 MG/10ML IV SOLN
INTRAVENOUS | Status: AC
  Filled 2013-08-01: qty 1

## 2013-08-01 MED ORDER — KETOROLAC TROMETHAMINE 30 MG/ML IJ SOLN
30.0000 mg | Freq: Four times a day (QID) | INTRAMUSCULAR | Status: AC
  Administered 2013-08-01 – 2013-08-02 (×4): 30 mg via INTRAVENOUS
  Filled 2013-08-01 (×3): qty 1

## 2013-08-01 MED ORDER — ENOXAPARIN SODIUM 40 MG/0.4ML ~~LOC~~ SOLN
40.0000 mg | SUBCUTANEOUS | Status: DC
  Administered 2013-08-02: 40 mg via SUBCUTANEOUS
  Filled 2013-08-01 (×3): qty 0.4

## 2013-08-01 MED ORDER — MIDAZOLAM HCL 2 MG/2ML IJ SOLN
INTRAMUSCULAR | Status: AC
  Filled 2013-08-01: qty 2

## 2013-08-01 MED ORDER — LACTATED RINGERS IV SOLN
INTRAVENOUS | Status: DC
  Administered 2013-08-01: 1000 mL via INTRAVENOUS
  Administered 2013-08-01: 18:00:00 via INTRAVENOUS

## 2013-08-01 MED ORDER — HEPARIN SODIUM (PORCINE) 1000 UNIT/ML IJ SOLN
INTRAMUSCULAR | Status: AC
  Filled 2013-08-01: qty 1

## 2013-08-01 MED ORDER — NEOSTIGMINE METHYLSULFATE 10 MG/10ML IV SOLN
INTRAVENOUS | Status: DC | PRN
  Administered 2013-08-01: 4 mg via INTRAVENOUS

## 2013-08-01 MED ORDER — BUPIVACAINE LIPOSOME 1.3 % IJ SUSP
20.0000 mL | Freq: Once | INTRAMUSCULAR | Status: DC
  Filled 2013-08-01: qty 20

## 2013-08-01 MED ORDER — ACETAMINOPHEN 10 MG/ML IV SOLN
1000.0000 mg | Freq: Four times a day (QID) | INTRAVENOUS | Status: DC
  Administered 2013-08-01: 1000 mg via INTRAVENOUS
  Filled 2013-08-01 (×4): qty 100

## 2013-08-01 MED ORDER — HYDROCHLOROTHIAZIDE 12.5 MG PO CAPS
12.5000 mg | ORAL_CAPSULE | Freq: Every day | ORAL | Status: DC
  Filled 2013-08-01: qty 1

## 2013-08-01 MED ORDER — HYDROMORPHONE HCL PF 1 MG/ML IJ SOLN
INTRAMUSCULAR | Status: AC
  Filled 2013-08-01: qty 1

## 2013-08-01 MED ORDER — ONDANSETRON HCL 4 MG PO TABS: 4.0000 mg | ORAL_TABLET | Freq: Four times a day (QID) | ORAL | Status: DC | PRN

## 2013-08-01 MED ORDER — LIDOCAINE-PRILOCAINE 2.5-2.5 % EX CREA
1.0000 "application " | TOPICAL_CREAM | CUTANEOUS | Status: DC | PRN
  Filled 2013-08-01: qty 5

## 2013-08-01 MED ORDER — KCL IN DEXTROSE-NACL 20-5-0.45 MEQ/L-%-% IV SOLN
INTRAVENOUS | Status: DC
  Administered 2013-08-01 – 2013-08-02 (×2): via INTRAVENOUS
  Administered 2013-08-02 (×2): 200 mL/h via INTRAVENOUS
  Filled 2013-08-01 (×5): qty 1000

## 2013-08-01 MED ORDER — SUCCINYLCHOLINE CHLORIDE 20 MG/ML IJ SOLN
INTRAMUSCULAR | Status: DC | PRN
  Administered 2013-08-01: 100 mg via INTRAVENOUS

## 2013-08-01 MED ORDER — ALLOPURINOL 100 MG PO TABS
100.0000 mg | ORAL_TABLET | Freq: Every day | ORAL | Status: DC
  Administered 2013-08-02 – 2013-08-04 (×3): 100 mg via ORAL
  Filled 2013-08-01 (×3): qty 1

## 2013-08-01 MED ORDER — LACTATED RINGERS IV SOLN: INTRAVENOUS | Status: DC

## 2013-08-01 MED ORDER — KETOROLAC TROMETHAMINE 30 MG/ML IJ SOLN
INTRAMUSCULAR | Status: AC
  Filled 2013-08-01: qty 1

## 2013-08-01 MED ORDER — ONDANSETRON HCL 4 MG/2ML IJ SOLN: 4.0000 mg | Freq: Four times a day (QID) | INTRAMUSCULAR | Status: DC | PRN

## 2013-08-01 MED ORDER — METOCLOPRAMIDE HCL 5 MG/ML IJ SOLN
INTRAMUSCULAR | Status: AC
  Filled 2013-08-01: qty 2

## 2013-08-01 MED ORDER — MAGNESIUM HYDROXIDE 400 MG/5ML PO SUSP
30.0000 mL | Freq: Three times a day (TID) | ORAL | Status: AC
  Administered 2013-08-01 – 2013-08-02 (×3): 30 mL via ORAL
  Filled 2013-08-01 (×3): qty 30

## 2013-08-01 MED ORDER — LIDOCAINE HCL (PF) 2 % IJ SOLN
INTRAMUSCULAR | Status: DC | PRN
  Administered 2013-08-01: 100 mg via INTRADERMAL

## 2013-08-01 MED ORDER — ROCURONIUM BROMIDE 100 MG/10ML IV SOLN
INTRAVENOUS | Status: AC
  Filled 2013-08-01: qty 1

## 2013-08-01 MED ORDER — ROCURONIUM BROMIDE 100 MG/10ML IV SOLN
INTRAVENOUS | Status: DC | PRN
  Administered 2013-08-01: 10 mg via INTRAVENOUS
  Administered 2013-08-01: 40 mg via INTRAVENOUS

## 2013-08-01 MED ORDER — DEXAMETHASONE SODIUM PHOSPHATE 10 MG/ML IJ SOLN
INTRAMUSCULAR | Status: DC | PRN
  Administered 2013-08-01: 10 mg via INTRAVENOUS

## 2013-08-01 MED ORDER — ENSURE COMPLETE PO LIQD
237.0000 mL | Freq: Two times a day (BID) | ORAL | Status: DC
Start: 2013-08-02 — End: 2013-08-02
  Administered 2013-08-02 (×2): 237 mL via ORAL

## 2013-08-01 MED ORDER — OLMESARTAN MEDOXOMIL-HCTZ 20-12.5 MG PO TABS: 1.0000 | ORAL_TABLET | Freq: Every morning | ORAL | Status: DC

## 2013-08-01 MED ORDER — HYDROMORPHONE HCL PF 1 MG/ML IJ SOLN: 0.5000 mg | INTRAMUSCULAR | Status: DC | PRN

## 2013-08-01 MED ORDER — PANTOPRAZOLE SODIUM 40 MG PO TBEC
80.0000 mg | DELAYED_RELEASE_TABLET | Freq: Every day | ORAL | Status: DC
  Administered 2013-08-01 – 2013-08-04 (×4): 80 mg via ORAL
  Filled 2013-08-01 (×4): qty 2

## 2013-08-01 MED ORDER — HYDROMORPHONE HCL PF 2 MG/ML IJ SOLN
INTRAMUSCULAR | Status: AC
  Filled 2013-08-01: qty 1

## 2013-08-01 MED ORDER — MIDAZOLAM HCL 5 MG/5ML IJ SOLN
INTRAMUSCULAR | Status: DC | PRN
  Administered 2013-08-01: 2 mg via INTRAVENOUS

## 2013-08-01 MED ORDER — ACETAMINOPHEN 500 MG PO TABS
1000.0000 mg | ORAL_TABLET | Freq: Four times a day (QID) | ORAL | Status: DC
  Administered 2013-08-01 – 2013-08-04 (×10): 1000 mg via ORAL
  Filled 2013-08-01 (×13): qty 2

## 2013-08-01 MED ORDER — PROPOFOL 10 MG/ML IV BOLUS
INTRAVENOUS | Status: DC | PRN
  Administered 2013-08-01: 130 mg via INTRAVENOUS

## 2013-08-01 MED ORDER — HEPARIN SODIUM (PORCINE) 5000 UNIT/ML IJ SOLN
5000.0000 [IU] | INTRAMUSCULAR | Status: AC
  Administered 2013-08-01: 5000 [IU] via SUBCUTANEOUS
  Filled 2013-08-01: qty 1

## 2013-08-01 MED ORDER — DEXAMETHASONE SODIUM PHOSPHATE 10 MG/ML IJ SOLN
INTRAMUSCULAR | Status: AC
  Filled 2013-08-01: qty 1

## 2013-08-01 MED ORDER — LIDOCAINE HCL (CARDIAC) 20 MG/ML IV SOLN
INTRAVENOUS | Status: AC
  Filled 2013-08-01: qty 5

## 2013-08-01 MED ORDER — ONDANSETRON HCL 4 MG/2ML IJ SOLN
INTRAMUSCULAR | Status: DC | PRN
  Administered 2013-08-01: 4 mg via INTRAVENOUS

## 2013-08-01 MED ORDER — SODIUM CHLORIDE 0.9 % IJ SOLN
INTRAMUSCULAR | Status: AC
  Filled 2013-08-01: qty 10

## 2013-08-01 MED ORDER — BUPIVACAINE LIPOSOME 1.3 % IJ SUSP
INTRAMUSCULAR | Status: DC | PRN
  Administered 2013-08-01: 20 mL

## 2013-08-01 MED ORDER — KETOROLAC TROMETHAMINE 30 MG/ML IJ SOLN
30.0000 mg | Freq: Four times a day (QID) | INTRAMUSCULAR | Status: AC
  Filled 2013-08-01 (×3): qty 1

## 2013-08-01 MED ORDER — OXYCODONE HCL 5 MG PO TABS
10.0000 mg | ORAL_TABLET | ORAL | Status: DC | PRN
  Administered 2013-08-01 – 2013-08-03 (×6): 10 mg via ORAL
  Filled 2013-08-01 (×6): qty 2

## 2013-08-01 SURGICAL SUPPLY — 55 items
APPLIER CLIP 11 MED OPEN (CLIP)
ATTRACTOMAT 16X20 MAGNETIC DRP (DRAPES) ×4 IMPLANT
BAG URINE DRAINAGE (UROLOGICAL SUPPLIES) IMPLANT
BENZOIN TINCTURE PRP APPL 2/3 (GAUZE/BANDAGES/DRESSINGS) ×4 IMPLANT
BLADE EXTENDED COATED 6.5IN (ELECTRODE) ×4 IMPLANT
CANISTER SUCTION 2500CC (MISCELLANEOUS) IMPLANT
CHLORAPREP W/TINT 26ML (MISCELLANEOUS) ×4 IMPLANT
CLIP APPLIE 11 MED OPEN (CLIP) IMPLANT
CLIP TI MEDIUM LARGE 6 (CLIP) IMPLANT
CLOSURE WOUND 1/2 X4 (GAUZE/BANDAGES/DRESSINGS) ×1
CONT SPEC 4OZ CLIKSEAL STRL BL (MISCELLANEOUS) IMPLANT
COVER SURGICAL LIGHT HANDLE (MISCELLANEOUS) ×8 IMPLANT
DRAPE UTILITY 15X26 (DRAPE) ×4 IMPLANT
DRAPE UTILITY XL STRL (DRAPES) ×4 IMPLANT
DRAPE WARM FLUID 44X44 (DRAPE) ×4 IMPLANT
DRESSING TELFA ISLAND 4X8 (GAUZE/BANDAGES/DRESSINGS) ×4 IMPLANT
ELECT REM PT RETURN 9FT ADLT (ELECTROSURGICAL) ×4
ELECTRODE REM PT RTRN 9FT ADLT (ELECTROSURGICAL) ×2 IMPLANT
GAUZE SPONGE 4X4 16PLY XRAY LF (GAUZE/BANDAGES/DRESSINGS) IMPLANT
GLOVE BIO SURGEON STRL SZ7.5 (GLOVE) ×8 IMPLANT
GLOVE BIOGEL M STRL SZ7.5 (GLOVE) ×20 IMPLANT
GLOVE INDICATOR 8.0 STRL GRN (GLOVE) ×4 IMPLANT
GOWN STRL REUS W/TWL XL LVL3 (GOWN DISPOSABLE) ×4 IMPLANT
KIT BASIN OR (CUSTOM PROCEDURE TRAY) ×4 IMPLANT
LIGASURE IMPACT 36 18CM CVD LR (INSTRUMENTS) ×4 IMPLANT
MANIFOLD NEPTUNE II (INSTRUMENTS) ×4 IMPLANT
NEEDLE HYPO 22GX1.5 SAFETY (NEEDLE) ×8 IMPLANT
NS IRRIG 1000ML POUR BTL (IV SOLUTION) ×16 IMPLANT
PACK GENERAL/GYN (CUSTOM PROCEDURE TRAY) ×4 IMPLANT
RETRACTOR WND ALEXIS 25 LRG (MISCELLANEOUS) ×2 IMPLANT
RTRCTR WOUND ALEXIS 25CM LRG (MISCELLANEOUS) ×4
SHEET LAVH (DRAPES) ×4 IMPLANT
SPONGE GAUZE 4X4 12PLY (GAUZE/BANDAGES/DRESSINGS) ×4 IMPLANT
SPONGE LAP 18X18 X RAY DECT (DISPOSABLE) ×8 IMPLANT
STRIP CLOSURE SKIN 1/2X4 (GAUZE/BANDAGES/DRESSINGS) ×3 IMPLANT
SUT ETHILON 1 LR 30 (SUTURE) IMPLANT
SUT PDS AB 0 CT1 36 (SUTURE) ×8 IMPLANT
SUT PDS AB 0 CTX 60 (SUTURE) ×8 IMPLANT
SUT SILK 2 0 (SUTURE) ×2
SUT SILK 2 0 30  PSL (SUTURE)
SUT SILK 2 0 30 PSL (SUTURE) IMPLANT
SUT SILK 2-0 18XBRD TIE 12 (SUTURE) ×2 IMPLANT
SUT VIC AB 0 CT1 36 (SUTURE) ×16 IMPLANT
SUT VIC AB 2-0 CT2 27 (SUTURE) ×8 IMPLANT
SUT VIC AB 2-0 SH 27 (SUTURE) ×4
SUT VIC AB 2-0 SH 27X BRD (SUTURE) ×4 IMPLANT
SUT VIC AB 3-0 CTX 36 (SUTURE) IMPLANT
SUT VIC AB 4-0 PS2 27 (SUTURE) ×8 IMPLANT
SUT VICRYL 0 TIES 12 18 (SUTURE) ×4 IMPLANT
SYR 20CC LL (SYRINGE) ×8 IMPLANT
TOWEL BLUE STERILE X RAY DET (MISCELLANEOUS) ×4 IMPLANT
TOWEL OR 17X26 10 PK STRL BLUE (TOWEL DISPOSABLE) ×4 IMPLANT
TOWEL OR NON WOVEN STRL DISP B (DISPOSABLE) ×4 IMPLANT
TRAY FOLEY CATH 14FRSI W/METER (CATHETERS) ×4 IMPLANT
WATER STERILE IRR 1500ML POUR (IV SOLUTION) ×4 IMPLANT

## 2013-08-01 NOTE — Op Note (Signed)
Preoperative Diagnosis:  Left adnexal mass., stage IVB uterine carcinosarcoma  Postoperative Diagnosis: Left adnexal mass., stage IVB uterine carcinosarcoma  Procedure(s) Performed: 1. Exploratory laparotomy with total hysterectomy bilateral salpingo-oophorectomy, omental biopsies.  Surgeon: Francetta Found.  Skeet Latch, MD., PhD  Assistant Surgeon: Lahoma Crocker M.D.   Specimens: Uterus Cervix, Bilateral tubes / ovaries, lower  omentum.   Complications: None.   Operative Findings: A 24cm left ovarian mass hemosiderine filled with omental adhesions.  Evidence of chemotherapeutic changes over the bowel.  Uterus 14 cm.  No palpable hepatic mets but the examination was limited by diaphragmatic adhesions.  No gross disease in the omentum.  Procedure:   The patient was seen in the Holding Room. The risks, benefits, complications, treatment options, and expected outcomes were discussed with the patient.  The patient concurred with the proposed plan, giving informed consent.   The patient was  identified as Caitlyn Higgins   and the procedure verified as hysterectomy, BSO.  A Time Out was held and the above information confirmed upon entry to the operating room..  After induction of anesthesia, the patient was draped and prepped in the usual sterile manner.  She was prepped and draped in the normal sterile fashion in the dorsal lithotomy position in padded Allen stirrups with good attention paid to support of the lower back and lower extremities. Position was adjusted for appropriate support. A Foley catheter was placed to gravity.   A midline vertical incision was made and carried through the subcutaneous tissue to the fascia. The fascial incision was made and extended superiorally. The rectus muscles were separated. The peritoneum was identified and entered.  Ascites was appreciated approximately 300cc.   The mass was noted to be adherent to the omentum.  A gallbladder trocar was used to decompress the  mass of 1000cc chocolate covered fluid. The omentum was transected with the ligasure.  Claps were placed over the left utero-ovarian ligament and the specimen delivered from the abdomen.  An Zebulon wound protector was placed.      A Bookwalter retractor was then placed.    After packing the small bowel into the upper abdomen, we began the procedure by entering the  pelvic sidewall .  The course of the ureter was identified with ease. The left IP was then skeletonized,secured and ligasured.    Kelly clamps were placed on both uterine cornua. The left  round ligament was transected with the bovie. The anterior peritoneal reflection was incised and the bladder was dissected off the lower uterine segment. The retroperitoneal space was explored and the ureters were identified bilaterally.   The right  ureter was identified in the retroperitoneum.  The left  IP was clamped transected and ligasured.  The uterine vessels were skeletonized bilaterally , then secured wand transected with the ligasure.  th 0-Vicryl suture. Serial pedicles of the cardinal and utero-sacral ligaments ligasured.  Entrance was made into the vagina and the cervix amputated. The vaginal cuff was then closed in the standard [facility] fashion with 0 PDS suture.   The pelvis was copiously irrigated. Hemostasis was observed.  The abdomen was copiously irrigated.  Hemostasis was assured.  The fascia was reapproximated with 0 looped PDS using a total of two sutures. The subcutaneous layer was then irrigated copiously.  Exparil was infiltrated into the subcutaneous tissue.  The subcutaneous layer was reapproximated with interrupted 2-0 Vicryl. The subcutaneous layer was then reapproximated with a running 4-0 Monocryl. The patient tolerated the procedure well.   Sponge,  lap and needle counts were correct x 2.    The patient had sequential compression devices for VTE prophylaxis and will receive Lovenox postoperatively. The patient was extubated and  taken to the recovery room in stable condition.

## 2013-08-01 NOTE — H&P (Signed)
PRE OP GYN ONCOLOGY   Chief Complaint: Uterine carcinosarcoma   Assessment/Plan  [name] is a 48 y.o.. 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 She is S/P IVC filter. [name] has received 4 cycles of chemtherapy and on physical examination there is no evidence of disease in the cervix and no bleeding.   A discussion was had with the patient and her family regarding her diagnosis and treatment plan. We discussed that typically, advanced endometrial cancer is not curable, and that the primary treatment would be palliative chemotherapy with hysterectomy with the goals of chemotherapy to stop progression of disease and attempt to improve symptoms, quality of life and hopefully extend survival. Hysterectomy will decrease heavy vaginal bleeding and allow for better genital hygiene.   She was transitioned from warfarin back to Lovenox for this surgery [name] was provided referral information for a financial counselor for assistance with the treatment bills given her uninsured status.Marland Kitchen    History of Present Illness  Caitlyn Higgins is a 48 y.o. who presented with a pelvic mass, to [facility] and was transferred to [facility] 02/23/2013. The patient feels that she was in her usual state of health until a few days prior to presentation when she noted abdominal distention and bloating. She presented to her gastroenterologist and was sent for a CT scan.  The CT 02/22/2013 demonstrated " 1. 24.1 x 16.8 x 19.9 cm complex cystic mass which appears to arise from the pelvis extending into the mid upper abdomen is highly suspicious for an ovarian neoplasm. There is evidence of early omental disease, as well as a serosal implant upon the surface of the liver, with small volume of probable malignant ascites,  and potential transdiaphragmatic extension based on small lymph nodes immediately above the diaphragm.  2. Importantly, this pelvic mass is causing severe compression of the proximal inferior vena cava, and there is a large volume of deep venous thrombosis in the visualized portion of the right thigh involving right superficial femoral, profunda femoral and common femoral veins. Additionally, there is a mass like peripheral wedge-shaped opacity in the right lower lobe. In the setting of deep venous thrombosis, this wedge-shaped opacity would be most concerning for potential pulmonary infarction. Associated with this, there is a trace right pleural effusion. 3. 5.2 x 1.8 cm heterogeneously enhancing lesion associated with the liver capsule overlying the right lobe of the liver " She was transferred to [facility].   IVC filter was placed and she was started on Lovenox. She was subsequently transitioned to warfarin.   In December 2014 an endometrial biopsy was collected. Pathology :  1. Cervix, biopsy  - HIGH GRADE ENDOMETRIAL CARCINOMA, SEE COMMENT.  2. Endometrium, biopsy  - HIGH GRADE ENDOMETRIAL CARCINOMA, SEE COMMENT.  Microscopic Comment  1. , 2. In both parts 1 and 2, slide sections demonstrate extensive involvement by high grade endometrial carcinoma demonstrating a biphasic epithelial and stromal architecture. The immunophenotype (strong diffuse estrogen receptor; strong diffuse vimentin expression; patchy monoclonal CEA expression, patchy p16 immunostain expression, and diffuse mild to moderate p53 expression) is consistent with primary endometrial origin. 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  [name] has received the 4th cycle  of Taxol carboplatin therapy on 05/24/2013.  Past Medical History   Diagnosis  Date   .  Reflux    .  Hypertension     .  Cancer      uterine carcinosarcoma   Pulmonary embolism    Past Surgical History   Procedure  Laterality  Date   .  No past surgeries      GYN History G0  Hx of Abnormal Pap Smears: no, last pap ~2010  Hx of STIs: no  Never had regular periods.   Family History  family history is not on file.   Review of Systems  There's been 30 pound weight loss since the visit in December 2014. Denies cough, reports improvement mild shortness of breath with exertion, no hemoptysis, reports intermittent nausea she reports constipation. Reports fatigue. States that there's been no vaginal bleeding.  Physical Exam  BP 119/80  Pulse 92  Temp(Src) 98.4 F (36.9 C)  Resp 18  SpO2 100%  General: No acute distress.  HEENT: Extra occular muscles grossly intact.  Pulmonary: CTAB. Normal work of breathing.  Cardiovascular: RRR.  Abdomen: Firm, moderately distended, non-tender. Pelvic and abdominal mass is palpable extending above the umbilicus. No rebound/guarding.  Musculoskeletal: Grossly normal ROM.  Extremities: Warm and well-perfused. 1+ pitting edema  Genitourinary: Normal external genitalia Bartholin's urethra and Skene's. No blood is noted within the vaginal vault. The cervix is approximately 3 cm no visible tumor on the cervix. A large multilobulated mass within the pelvis  Rectal: Good tone and pelvic masses palpable no intraluminal masses.

## 2013-08-01 NOTE — Interval H&P Note (Signed)
History and Physical Interval Note:  08/01/2013 3:53 PM  [place]  has presented today for surgery, with the diagnosis of uterine cancer  The various methods of treatment have been discussed with the patient and family. After consideration of risks, benefits and other options for treatment, the patient has consented to  Procedure(s): EXPLORATORY LAPAROTOMY/HYSTERECTOMY TOTAL ABDOMINAL (N/A) BILATERAL SALPINGO OOPHORECTOMY/OMENTECTOMY WITH POSSIBLE DEBULKING (Bilateral) as a surgical intervention .  The patient's history has been reviewed, patient examined, no change in status, stable for surgery.  I have reviewed the patient's chart and labs.  Questions were answered to the patient's satisfaction.     Janie Morning

## 2013-08-01 NOTE — Anesthesia Postprocedure Evaluation (Signed)
  Anesthesia Post-op Note  Patient: Caitlyn Higgins  Procedure(s) Performed: Procedure(s) (LRB): EXPLORATORY LAPAROTOMY/HYSTERECTOMY TOTAL ABDOMINAL (N/A) BILATERAL SALPINGO OOPHORECTOMY/OMENTECTOMY  (Bilateral)  Patient Location: PACU  Anesthesia Type: General  Level of Consciousness: awake and alert   Airway and Oxygen Therapy: Patient Spontanous Breathing  Post-op Pain: mild  Post-op Assessment: Post-op Vital signs reviewed, Patient's Cardiovascular Status Stable, Respiratory Function Stable, Patent Airway and No signs of Nausea or vomiting  Last Vitals:  Filed Vitals:   08/01/13 1845  BP: 119/66  Pulse: 70  Temp:   Resp: 16    Post-op Vital Signs: stable   Complications: No apparent anesthesia complications

## 2013-08-01 NOTE — Transfer of Care (Signed)
Immediate Anesthesia Transfer of Care Note  Patient: Caitlyn Higgins  Procedure(s) Performed: Procedure(s) (LRB): EXPLORATORY LAPAROTOMY/HYSTERECTOMY TOTAL ABDOMINAL (N/A) BILATERAL SALPINGO OOPHORECTOMY/OMENTECTOMY  (Bilateral)  Patient Location: PACU  Anesthesia Type: General  Level of Consciousness: sedated, patient cooperative and responds to stimulation  Airway & Oxygen Therapy: Patient Spontanous Breathing and Patient connected to face mask oxgen  Post-op Assessment: Report given to PACU RN and Post -op Vital signs reviewed and stable  Post vital signs: Reviewed and stable  Complications: No apparent anesthesia complications

## 2013-08-01 NOTE — Anesthesia Preprocedure Evaluation (Addendum)
Anesthesia Evaluation  Patient identified by MRN, date of birth, ID band Patient awake    Reviewed: Allergy & Precautions, H&P , NPO status , Patient's Chart, lab work & pertinent test results  Airway Mallampati: II TM Distance: >3 FB Neck ROM: full    Dental no notable dental hx. (+) Missing, Dental Advisory Given Missing lateral incissors:   Pulmonary neg pulmonary ROS,  History PE 12/14 breath sounds clear to auscultation  Pulmonary exam normal       Cardiovascular Exercise Tolerance: Good hypertension, Pt. on medications Rhythm:regular Rate:Normal     Neuro/Psych negative neurological ROS  negative psych ROS   GI/Hepatic negative GI ROS, Neg liver ROS, GERD-  Medicated and Controlled,malnutrition   Endo/Other  negative endocrine ROS  Renal/GU Renal insufficiency  negative genitourinary   Musculoskeletal   Abdominal   Peds  Hematology negative hematology ROS (+) anemia , hgb 8.0   Anesthesia Other Findings Uterine carcinosarcoma  Reproductive/Obstetrics negative OB ROS                          Anesthesia Physical Anesthesia Plan  ASA: III  Anesthesia Plan: General   Post-op Pain Management:    Induction: Intravenous  Airway Management Planned: Oral ETT  Additional Equipment:   Intra-op Plan:   Post-operative Plan: Extubation in OR  Informed Consent: I have reviewed the patients History and Physical, chart, labs and discussed the procedure including the risks, benefits and alternatives for the proposed anesthesia with the patient or authorized representative who has indicated his/her understanding and acceptance.   Dental Advisory Given  Plan Discussed with: CRNA and Surgeon  Anesthesia Plan Comments:         Anesthesia Quick Evaluation

## 2013-08-02 ENCOUNTER — Encounter (HOSPITAL_COMMUNITY): Payer: Self-pay | Admitting: Gynecologic Oncology

## 2013-08-02 LAB — BASIC METABOLIC PANEL
BUN: 23 mg/dL (ref 6–23)
CALCIUM: 9.5 mg/dL (ref 8.4–10.5)
CO2: 25 mEq/L (ref 19–32)
Chloride: 100 mEq/L (ref 96–112)
Creatinine, Ser: 1.39 mg/dL — ABNORMAL HIGH (ref 0.50–1.10)
GFR calc non Af Amer: 44 mL/min — ABNORMAL LOW (ref >90)
GFR, EST AFRICAN AMERICAN: 51 mL/min — AB (ref >90)
Glucose, Bld: 130 mg/dL — ABNORMAL HIGH (ref 70–99)
POTASSIUM: 4.4 meq/L (ref 3.7–5.3)
Sodium: 136 mEq/L — ABNORMAL LOW (ref 137–147)

## 2013-08-02 LAB — CBC
HCT: 23 % — ABNORMAL LOW (ref 36.0–46.0)
HEMOGLOBIN: 7.7 g/dL — AB (ref 12.0–15.0)
MCH: 32.4 pg (ref 26.0–34.0)
MCHC: 33.5 g/dL (ref 30.0–36.0)
MCV: 96.6 fL (ref 78.0–100.0)
PLATELETS: 213 10*3/uL (ref 150–400)
RBC: 2.38 MIL/uL — AB (ref 3.87–5.11)
RDW: 16.6 % — ABNORMAL HIGH (ref 11.5–15.5)
WBC: 8.3 10*3/uL (ref 4.0–10.5)

## 2013-08-02 LAB — POCT I-STAT 4, (NA,K, GLUC, HGB,HCT)
Glucose, Bld: 80 mg/dL (ref 70–99)
HEMATOCRIT: 25 % — AB (ref 36.0–46.0)
HEMOGLOBIN: 8.5 g/dL — AB (ref 12.0–15.0)
POTASSIUM: 3.6 meq/L — AB (ref 3.7–5.3)
SODIUM: 143 meq/L (ref 137–147)

## 2013-08-02 MED ORDER — IBUPROFEN 600 MG PO TABS
600.0000 mg | ORAL_TABLET | Freq: Three times a day (TID) | ORAL | Status: DC
  Administered 2013-08-02 – 2013-08-04 (×6): 600 mg via ORAL
  Filled 2013-08-02 (×8): qty 1

## 2013-08-02 MED ORDER — BOOST PLUS PO LIQD
237.0000 mL | Freq: Three times a day (TID) | ORAL | Status: DC
  Administered 2013-08-04 (×2): 237 mL via ORAL
  Filled 2013-08-02 (×8): qty 237

## 2013-08-02 MED ORDER — SODIUM CHLORIDE 0.9 % IV BOLUS (SEPSIS)
500.0000 mL | Freq: Once | INTRAVENOUS | Status: AC
  Administered 2013-08-02: 500 mL via INTRAVENOUS

## 2013-08-02 NOTE — Progress Notes (Signed)
Lower portion of incision open superficially measuring 5 cm in length irrigated with normal saline and cleansed betadine by Dr. Delsa Sale. 5 cc of 2% lidocaine used to numb the area.  Minimal drainage.  Sides approximated and 9 staples inserted into the incision without difficulty by Dr. Delsa Sale.  Patient tolerated the procedure well.  Family at the bedside.  Steri strips applied to the incision above the staples.   Dr. Delsa Sale, Joylene John, NP , and Armanda Magic, RN at the bedside during the procedure.  Light dressing applied over the staples.  Advised to notify her RN for any issues or concerns.

## 2013-08-02 NOTE — Care Management Note (Signed)
    Page 1 of 1   08/02/2013     1:22:07 PM CARE MANAGEMENT NOTE 08/02/2013  Patient:  Caitlyn Higgins   Account Number:  [id number]  Date Initiated:  08/02/2013  Documentation initiated by:  Sunday Spillers  Subjective/Objective Assessment:   48 yo female admitted s/p open TAH. PTA lived at home with sister.     Action/Plan:   Home when stable   Anticipated DC Date:  08/05/2013   Anticipated DC Plan:  [place]  CM consult      Choice offered to / List presented to:             Status of service:  Completed, signed off Medicare Important Message given?   (If response is "NO", the following Medicare IM given date fields will be blank) Date Medicare IM given:   Date Additional Medicare IM given:    Discharge Disposition:  HOME/SELF CARE  Per UR Regulation:  Reviewed for med. necessity/level of care/duration of stay  If discussed at [place] of Stay Meetings, dates discussed:    Comments:

## 2013-08-02 NOTE — Progress Notes (Signed)
INITIAL NUTRITION ASSESSMENT  DOCUMENTATION CODES Per approved criteria  -Severe malnutrition in the context of chronic illness  Pt meets criteria for severe MALNUTRITION in the context of chronic illness as evidenced by 17% body weight loss in 6 months, severe subcutaneous fat loss.   INTERVENTION: -Recommend Boost Plus BID -Will continue to monitor  NUTRITION DIAGNOSIS: Inadequate oral intake related to decreased appetite/taste changes as evidenced by PO intake <75%, 40 lbs wt loss.   Goal: Pt to meet >/= 90% of their estimated nutrition needs    Monitor:  Total protein/energy intake, labs, weights, GI profile  Reason for Assessment: MST  48 y.o. female  Admitting Dx: Endometrial cancer  ASSESSMENT: Caitlyn Higgins is a 48 y.o.. 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 Dicussed addition of Carnation Instant Breakfast into supplement regimen for a lower cost alternative. Recommended pt use 2% vs whole milk for additional kcal -Pt reported some improvement with appetite, and has been able to maintain weight around 200-205 lbs. Diet recall indicates pt consumes high salt/high fat foods, likely fast foods -Pt tolerating diet post hysterectomy, eager for diet advancement Nutrition Focused Physical Exam:  Subcutaneous Fat:  Orbital Region: WDL Upper Arm Region: severe Thoracic and Lumbar Region: n/a  Muscle:  Temple Region: WDL Clavicle Bone Region: WDL Clavicle and Acromion Bone Region: WDL Scapular Bone Region: n/a Dorsal Hand: WDL Patellar  Region: WDL Anterior Thigh Region: WDL Posterior Calf Region: WDL  Edema: none noted    Height: Ht Readings from Last 1 Encounters:  08/01/13 5' 7.5" (1.715 m)    Weight: Wt Readings from Last 1 Encounters:  08/01/13 203 lb 8 oz (92.307 kg)    Ideal Body Weight: 140 lbs  % Ideal Body Weight: 145%  Wt Readings from Last 10 Encounters:  08/01/13 203 lb 8 oz (92.307 kg)  08/01/13 203 lb 8 oz (92.307 kg)  07/26/13 203 lb 8 oz (92.307 kg)  06/22/13 205 lb 11.2 oz (93.305 kg)  06/08/13 204 lb 14.4 oz (92.942 kg)  05/15/13 211 lb 6.4 oz (95.89 kg)  05/11/13 205 lb 11.2 oz (93.305 kg)  04/19/13 211 lb 3.2 oz (95.8 kg)  04/10/13 210 lb 11.2 oz (95.573 kg)  03/29/13 220 lb 9.6 oz (100.064 kg)    Usual Body Weight: 240 lbs  % Usual Body Weight: 85%  BMI:  Body mass index is 31.38 kg/(m^2).  Estimated Nutritional Needs: Kcal: 1900-2100 Protein: 110-120 gram Fluid: >/=1900 ml/daily  Skin: surgical incision on abd  Diet Order: Criss Rosales  EDUCATION NEEDS: -No education needs identified at this time   Intake/Output Summary (Last 24 hours) at 08/02/13 1515 Last data filed at 08/02/13 1400  Gross per 24 hour  Intake   1300 ml  Output   1045 ml  Net    255 ml    Last BM: 5/11   Labs:   Recent Labs Lab 08/01/13 1623 08/02/13 0505  NA 143 136*  K 3.6* 4.4  CL  --  100  CO2  --  25  BUN  --  23  CREATININE  --  1.39*  CALCIUM  --  9.5  GLUCOSE 80 130*    CBG (last 3)  No results found for this basename: GLUCAP,  in the last 72 hours  Scheduled Meds: . acetaminophen  1,000 mg Oral QID  . allopurinol  100 mg Oral Daily  . enoxaparin (LOVENOX) injection  40 mg Subcutaneous Q24H  . lactose free nutrition  237 mL Oral TID BM  . pantoprazole  80 mg Oral Daily    Continuous Infusions: . dextrose 5 % and 0.45 % NaCl with KCl 20 mEq/L 200 mL/hr (08/02/13 1142)    Past Medical History  Diagnosis Date  . Reflux   . Hypertension   . Cancer     uterine  carcinosarcoma  . DVT (deep venous thrombosis)     right leg 12'14  . Pulmonary emboli     12'14 denies any sob  . Elevated blood uric acid level     tx. Allopurinol  . GERD (gastroesophageal reflux disease)   . Transfusion history 07-26-13    12'14, 04-14-13(2 units), 06-09-13  . Anemia     Past Surgical History  Procedure Laterality Date  . No past surgeries    . Portacath placement Right     rigth chest- remains  . Ivc Right     filter placed to prevent Pulmonary emboli  . Abdominal hysterectomy N/A 08/01/2013    Procedure: EXPLORATORY LAPAROTOMY/HYSTERECTOMY TOTAL ABDOMINAL;  Surgeon: Janie Morning, MD;  Location: [facility];  Service: Gynecology;  Laterality: N/A;  . Salpingoophorectomy Bilateral 08/01/2013    Procedure: BILATERAL SALPINGO OOPHORECTOMY/OMENTECTOMY ;  Surgeon: Janie Morning, MD;  Location: [facility];  Service: Gynecology;  Laterality: Bilateral;    [place] LDN Clinical Dietitian [phone]

## 2013-08-02 NOTE — Progress Notes (Signed)
1 Day Post-Op Procedure(s) (LRB): EXPLORATORY LAPAROTOMY/HYSTERECTOMY TOTAL ABDOMINAL (N/A) BILATERAL SALPINGO OOPHORECTOMY/OMENTECTOMY  (Bilateral)  Subjective: Patient reports feeling well since surgery.  Tolerating PO with no nausea or emesis.  Denies chest pain, dyspnea, passing flatus, having a bowel movement, dizziness, lightheadedness.  Minimal pain reported at a level of 3.  No concerns voiced.  She would like to hold off on receiving a blood transfusion at this time.    Objective: Vital signs in last 24 hours: Temp:  [97.4 F (36.3 C)-98.4 F (36.9 C)] 98.3 F (36.8 C) (05/13 0800) Pulse Rate:  [55-92] 79 (05/13 0940) Resp:  [10-18] 18 (05/13 0800) BP: (94-125)/(59-87) 94/61 mmHg (05/13 0940) SpO2:  [96 %-100 %] 100 % (05/13 0800) Weight:  [203 lb 8 oz (92.307 kg)] 203 lb 8 oz (92.307 kg) (05/12 2235) Last BM Date: 07/31/13  Intake/Output from previous day: 05/12 0701 - 05/13 0700 In: 1300 [I.V.:1300] Out: 595 [Urine:595]  Physical Examination: General: alert, cooperative and no distress Resp: clear to auscultation bilaterally Cardio: regular rate and rhythm, S1, S2 normal, no murmur, click, rub or gallop GI: soft, non-tender; bowel sounds normal; no masses,  no organomegaly and incision: midline incision with sutures, superficially separated above the mons, minimal sanguinous drainage present Extremities: extremities normal, atraumatic, no cyanosis or edema  Labs: WBC/Hgb/Hct/Plts:  8.3/7.7/23.0/213 (05/13 0505) BUN/Cr/glu/ALT/AST/amyl/lip:  23/1.39/--/--/--/--/-- (05/13 0505)  Assessment: 48 y.o. s/p Procedure(s): EXPLORATORY LAPAROTOMY/HYSTERECTOMY TOTAL ABDOMINAL BILATERAL SALPINGO OOPHORECTOMY/OMENTECTOMY : stable Pain:  Pain is well-controlled on PRN medications.  Heme: Hgb 7.7 and Hct 23.0 this am.  No transfusion at this time per patient.  Recheck in the am.  CV: BP low at times- holding antihypertensives at this time.  Asymptomatic currently.    GI:   Tolerating po: Yes.     GU:  Urine output improving.    FEN:  Stable post-operatively.  Prophylaxis: pharmacologic prophylaxis (with any of the following: lovenox) and intermittent pneumatic compression boots.  Plan: Continue IVF at 200 cc/hr Remove foley  Encourage ambulation, IS use, deep breathing, and coughing No transfusion at this time Recheck CBC in the am Continue post-operative plan of care per Dr. Delsa Sale   LOS: 1 day    Melissa D Cross 08/02/2013, 10:27 AM

## 2013-08-03 LAB — PREPARE RBC (CROSSMATCH)

## 2013-08-03 LAB — HEMOGLOBIN AND HEMATOCRIT, BLOOD
HCT: 19.9 % — ABNORMAL LOW (ref 36.0–46.0)
Hemoglobin: 6.5 g/dL — CL (ref 12.0–15.0)

## 2013-08-03 MED ORDER — ENOXAPARIN SODIUM 40 MG/0.4ML ~~LOC~~ SOLN
40.0000 mg | SUBCUTANEOUS | Status: DC
Start: 2013-08-03 — End: 2013-08-04
  Administered 2013-08-03: 40 mg via SUBCUTANEOUS
  Filled 2013-08-03 (×2): qty 0.4

## 2013-08-03 MED ORDER — LORATADINE 10 MG PO TABS
10.0000 mg | ORAL_TABLET | Freq: Every day | ORAL | Status: DC
  Administered 2013-08-03 – 2013-08-04 (×2): 10 mg via ORAL
  Filled 2013-08-03 (×2): qty 1

## 2013-08-03 NOTE — Progress Notes (Signed)
2 Days Post-Op Procedure(s) (LRB): EXPLORATORY LAPAROTOMY/HYSTERECTOMY TOTAL ABDOMINAL (N/A) BILATERAL SALPINGO OOPHORECTOMY/OMENTECTOMY  (Bilateral)  Subjective: Patient reports feeling well since surgery.  Three bowel movements reported with flatus.  Tolerating PO with no nausea or emesis.  Denies chest pain, dyspnea, dizziness, incisional drainage/bleeding, vaginal bleeding, or lightheadedness.  No concerns voiced.  She is agreeable with proceeding with a blood transfusion this am and has tolerated well in the past.    Objective: Vital signs in last 24 hours: Temp:  [97.9 F (36.6 C)-98.8 F (37.1 C)] 98.1 F (36.7 C) (05/14 0558) Pulse Rate:  [71-86] 71 (05/14 0558) Resp:  [16-18] 18 (05/14 0558) BP: (90-111)/(58-69) 97/64 mmHg (05/14 0558) SpO2:  [97 %-100 %] 100 % (05/14 0558) Last BM Date: 08/02/13  Intake/Output from previous day: 05/13 0701 - 05/14 0700 In: 3478.3 [I.V.:3478.3] Out: 1800 [Urine:1800]  Physical Examination: General: alert, cooperative and no distress Resp: clear to auscultation bilaterally Cardio: regular rate and rhythm, S1, S2 normal, no murmur, click, rub or gallop GI: soft, non-tender; bowel sounds normal; no masses,  no organomegaly and incision: midline incision intact with no drainage, staples intact on the lower portion of the incision Extremities: extremities normal, atraumatic, no cyanosis or edema  Labs: WBC/Hgb/Hct/Plts:  --/6.5/19.9/-- (05/14 0434)    Assessment: 48 y.o. s/p Procedure(s): EXPLORATORY LAPAROTOMY/HYSTERECTOMY TOTAL ABDOMINAL BILATERAL SALPINGO OOPHORECTOMY/OMENTECTOMY : stable Pain:  Pain is well-controlled on PRN medications.  Heme: Hgb 6.5 and Hct 19.9 this am.  Plan for post-transfusion H&H after two units.  CV: BP low at times- holding antihypertensives at this time.  Asymptomatic currently.    GI:  Tolerating po: Yes.     GU:  Urine output improved.    FEN:  Stable post-operatively.  Prophylaxis: pharmacologic  prophylaxis (with any of the following: lovenox) and intermittent pneumatic compression boots.  Plan: Transfuse 2 units PRBCs today per Dr. Delsa Sale Continue Lovenox 40 mg SQ at this time with plans for lovenox to coumadin bridging if Hgb stable per Dr. Delsa Sale Encourage ambulation, IS use, deep breathing, and coughing Continue post-operative plan of care per Dr. Delsa Sale May use port for blood transfusion if peripheral IV ineffective   LOS: 2 days    Caitlyn Higgins 08/03/2013, 8:51 AM

## 2013-08-03 NOTE — Progress Notes (Signed)
Notified Dr. Delsa Sale of critical labs results.  Dr. Delsa Sale will talk to the patient during round for further evaluation.

## 2013-08-03 NOTE — Progress Notes (Signed)
Pt requested to restart Claritin. Pt c/o stuffy nose. Dr. Delsa Sale aware via phone. See new orders received.

## 2013-08-04 ENCOUNTER — Other Ambulatory Visit: Payer: Self-pay | Admitting: *Deleted

## 2013-08-04 DIAGNOSIS — C549 Malignant neoplasm of corpus uteri, unspecified: Secondary | ICD-10-CM

## 2013-08-04 LAB — HEMOGLOBIN AND HEMATOCRIT, BLOOD
HCT: 23.6 % — ABNORMAL LOW (ref 36.0–46.0)
HEMOGLOBIN: 7.9 g/dL — AB (ref 12.0–15.0)

## 2013-08-04 LAB — TYPE AND SCREEN
ABO/RH(D): O POS
Antibody Screen: POSITIVE
DAT, IGG: POSITIVE
Unit division: 0
Unit division: 0
Unit division: 0

## 2013-08-04 LAB — PROTIME-INR
INR: 1.13 (ref 0.00–1.49)
Prothrombin Time: 14.3 seconds (ref 11.6–15.2)

## 2013-08-04 MED ORDER — WARFARIN SODIUM 10 MG PO TABS
10.0000 mg | ORAL_TABLET | Freq: Once | ORAL | Status: DC
  Filled 2013-08-04: qty 1

## 2013-08-04 MED ORDER — ENOXAPARIN SODIUM 120 MG/0.8ML ~~LOC~~ SOLN: 1.5000 mg/kg | Freq: Every day | SUBCUTANEOUS | Status: DC

## 2013-08-04 MED ORDER — ENOXAPARIN SODIUM 150 MG/ML ~~LOC~~ SOLN: 1.5000 mg/kg | SUBCUTANEOUS | Status: DC

## 2013-08-04 MED ORDER — ENOXAPARIN SODIUM 150 MG/ML ~~LOC~~ SOLN
1.5000 mg/kg | SUBCUTANEOUS | Status: DC
  Administered 2013-08-04: 140 mg via SUBCUTANEOUS
  Filled 2013-08-04: qty 1

## 2013-08-04 MED ORDER — WARFARIN - PHARMACIST DOSING INPATIENT: Freq: Every day | Status: DC

## 2013-08-04 NOTE — Discharge Instructions (Signed)
Hysterectomy, Abdominal  Care After Please read the instructions below. Refer to these instructions for the next few weeks. These instructions provide you with general information on caring for yourself after surgery. Your caregiver may also give you specific instructions. While your treatment has been planned according to the most current medical practices available, unavoidable problems sometimes happen. If you have any problems or questions after you leave, please call your caregiver. HOME CARE INSTRUCTIONS Healing will take time. You will have discomfort, tenderness, swelling and bruising at the operative site for a couple of weeks. This is normal and will get better as time goes on.   Only take over-the-counter or prescription medicines for pain, discomfort or fever as directed by your caregiver.   Do not take aspirin. It can cause bleeding.   Do not drive when taking pain medication.   Follow your caregivers advice regarding diet, exercise, lifting, driving and general activities.   Resume your usual diet as directed and allowed.   Get plenty of rest and sleep.   Do not douche, use tampons, or have sexual intercourse until your caregiver gives you permission.   Change your bandages (dressings) as directed.   Take your temperature twice a day. Write it down.   Your caregiver may recommend showers instead of baths for a few weeks.   Do not drink alcohol until your caregiver gives you permission.   If you develop constipation, you may take a mild laxative with your caregivers permission. Bran foods and drinking fluids helps with constipation problems.   Try to have someone home with you for a week or two to help with the household activities.   Make sure you and your family understands everything about your operation and recovery.   Do not sign any legal documents until you feel normal again.   Keep all your follow-up appointments as recommended by your caregiver.  SEEK  MEDICAL CARE IF:  There is swelling, redness or increasing pain in the wound area.   Pus is coming from the wound.   You notice a bad smell from the wound or surgical dressing.   You have pain, redness and swelling from the intravenous site.   The wound is breaking open (the edges are not staying together).   You feel dizzy or feel like fainting.   You develop pain or bleeding when you urinate.   You develop diarrhea.   You develop nausea and vomiting.   You develop abnormal vaginal discharge.   You develop a rash.   You have any type of abnormal reaction or develop an allergy to your medication.   You need stronger pain medication for your pain.  SEEK IMMEDIATE MEDICAL CARE:  You develop a temperature of 100.6 or higher.   You develop abdominal pain.   You develop chest pain.   You develop shortness of breath.   You pass out.   You develop pain, swelling or redness of your leg.   You develop heavy vaginal bleeding with or without blood clots.  Document Released: 09/26/2004 Document Re-Released: 08/27/2009 [facility] Patient Information 2011 [place].

## 2013-08-04 NOTE — Progress Notes (Addendum)
Patient verbalized understanding of discharge instructions. Patient is stable at discharge. Informed patient of her prescription at [facility] out patient pharmacy. Pharmacist told me of the price of the Lovenox. Patient stated "I have been getting samples at the [facility] and I have a couple at home". Pharmacy states she can get the [place] to call prescription in and she may be eligible for a "green card". Informed the patient of this. Patient states "I will follow up with my doctor at the [facility]." Patient refuses to wait for additional information related to Lovenox prescription. Patient states " I am ready to go home, I will follow up with my oncologist". Notified Mickle Mallory, Case Management after patient discharged. No further plans of action at this time.

## 2013-08-04 NOTE — Progress Notes (Signed)
ANTICOAGULATION CONSULT NOTE - Initial Consult  Pharmacy Consult for warfarin Indication: pulmonary embolus and DVT  No Known Allergies  Patient Measurements: Height: 5' 7.5" (171.5 cm) Weight: 203 lb 8 oz (92.307 kg) IBW/kg (Calculated) : 62.75  Vital Signs: Temp: 97.8 F (36.6 C) (05/15 0530) Temp src: Oral (05/15 0530) BP: 96/65 mmHg (05/15 0530) Pulse Rate: 69 (05/15 0530)  Labs:  Recent Labs  08/01/13 1123  08/02/13 0505 08/03/13 0434 08/04/13 0110 08/04/13 0943  HGB  --   < > 7.7* 6.5* 7.9*  --   HCT  --   < > 23.0* 19.9* 23.6*  --   PLT  --   --  213  --   --   --   LABPROT 15.7*  --   --   --   --  14.3  INR 1.28  --   --   --   --  1.13  CREATININE  --   --  1.39*  --   --   --   < > = values in this interval not displayed.  Estimated Creatinine Clearance: 57.7 ml/min (by C-G formula based on Cr of 1.39).   Medical History: Past Medical History  Diagnosis Date  . Reflux   . Hypertension   . Cancer     uterine carcinosarcoma  . DVT (deep venous thrombosis)     right leg 12'14  . Pulmonary emboli     12'14 denies any sob  . Elevated blood uric acid level     tx. Allopurinol  . GERD (gastroesophageal reflux disease)   . Transfusion history 07-26-13    12'14, 04-14-13(2 units), 06-09-13  . Anemia     Medications:  Scheduled:  . acetaminophen  1,000 mg Oral QID  . allopurinol  100 mg Oral Daily  . enoxaparin (LOVENOX) injection  1.5 mg/kg Subcutaneous Q24H  . ibuprofen  600 mg Oral TID WC  . lactose free nutrition  237 mL Oral TID BM  . loratadine  10 mg Oral Daily  . pantoprazole  80 mg Oral Daily  . warfarin  10 mg Oral ONCE-1800  . Warfarin - Pharmacist Dosing Inpatient   Does not apply q1800   Infusions:   PRN: HYDROmorphone (DILAUDID) injection, lidocaine-prilocaine, ondansetron (ZOFRAN) IV, ondansetron, oxyCODONE  Assessment: 48 y/o F with metastatic uterine carcinosarcoma, s/p 4 cycles of neoadjuvant chemotherapy, admitted for exp lap  with TAH/BSO, omental bxs, performed 5/12. Had RLE DVT, PE with lung necrosis at initial presentation - has IVC filter and was on warfarin PTA (managed by Dr. Marko Plume and pharmacy at [facility]). INR was 3.0 on 4/28 on warfarin 10mg  daily. Warf was stopped for surgery (last dose 5/6) and Lovenox bridge was used preop (140mg  q24h on 5/9, 5/10). 5/15: POD3 MD has restarted full dose lovenox once daily 1.5mg /kg/day bridge. INR= 1.13.  Pt malnourished with poor oral intake at home (40lb weight loss in 6 months).   Drug interactions: allopurinol may increase effects of warfarin  Goal of Therapy:  INR 2-3   Plan:  Give Warfarin 10mg  x1 today Continue to bridge with Lovenox 1.5mg /kg/day until INR > 2 F/u INR  Kizzie Furnish, PharmD Pager: [phone] 08/04/2013 10:58 AM

## 2013-08-04 NOTE — Discharge Summary (Signed)
Physician Discharge Summary  Patient ID: [name] MRN: [id number] DOB/AGE: 09/04/1965 48 y.o.  Admit date: 08/01/2013 Discharge date: 08/04/2013  Admission Diagnoses: Endometrial cancer  Discharge Diagnoses:  Principal Problem:   Endometrial cancer Active Problems:   Uterine cancer   Discharged Condition: good  Hospital Course: On 08/01/2013, the patient underwent the following: Procedure(s): EXPLORATORY LAPAROTOMY/HYSTERECTOMY TOTAL ABDOMINAL BILATERAL SALPINGO OOPHORECTOMY/OMENTECTOMY .   The postoperative course was remarkable for a hemoglobin nadir of 6.5 on POD#2; there is a history of chronic anemia.  Ok Edwards She was transfused 2 units of PRBC.  She remained hemodynamically stable.  In light of a recent thromboembolism, Lovenox bridging was started.  She was instructed to resume her Coumadin regimen on the day of discharge.  She is to return on Monday to the gyn oncology office for PT/INR and staple removal.  She is to continue follow-up with medical oncology.  She was discharged to home on postoperative day 3 tolerating a regular diet.  Consults: Pharmacy  Significant Diagnostic Studies: see above  Treatments: surgery: see above  Discharge Exam: Blood pressure 96/65, pulse 69, temperature 97.8 F (36.6 C), temperature source Oral, resp. rate 18, height 5' 7.5" (1.715 m), weight 92.307 kg (203 lb 8 oz), SpO2 99.00%. General appearance: alert Resp: clear to auscultation bilaterally Cardio: regular rate and rhythm, S1, S2 normal, no murmur, click, rub or gallop GI: soft, non-tender; bowel sounds normal; no masses,  no organomegaly Extremities: extremities normal, atraumatic, no cyanosis or edema  Disposition: 02-Short Term Hospital  Discharge Instructions   Activity as tolerated - No restrictions    Complete by:  As directed      Call MD for:  extreme fatigue    Complete by:  As directed      Call MD for:  persistant dizziness or light-headedness    Complete  by:  As directed      Call MD for:  persistant nausea and vomiting    Complete by:  As directed      Call MD for:  redness, tenderness, or signs of infection (pain, swelling, redness, odor or green/yellow discharge around incision site)    Complete by:  As directed      Call MD for:  severe uncontrolled pain    Complete by:  As directed      Call MD for:  temperature >100.4    Complete by:  As directed      Diet - low sodium heart healthy    Complete by:  As directed      Discharge wound care:    Complete by:  As directed   Keep clean and dry     Driving Restrictions    Complete by:  As directed   No driving for 2 weeks     Increase activity slowly    Complete by:  As directed      Lifting restrictions    Complete by:  As directed   No lifting > 5 lbs for 6 weeks     May shower / Bathe    Complete by:  As directed   No tub baths for 6 weeks     May walk up steps    Complete by:  As directed      Sexual Activity Restrictions    Complete by:  As directed   No intercourse for 6 - 8 weeks            Medication List  allopurinol 100 MG tablet  Commonly known as:  ZYLOPRIM  Take 1 tablet (100 mg total) by mouth daily.     dexamethasone 4 MG tablet  Commonly known as:  DECADRON  Take 5 tabs 12 hr and 6 hr prior to taxol chemotherapy with food.     docusate sodium 100 MG capsule  Commonly known as:  COLACE  Take 100 mg by mouth daily.     enoxaparin 150 MG/ML injection  Commonly known as:  LOVENOX  Inject 0.92 mLs (140 mg total) into the skin daily.     ferrous fumarate 325 (106 FE) MG Tabs tablet  Commonly known as:  HEMOCYTE - 106 mg FE  Take 1 tablet (106 mg of iron total) by mouth daily.     lidocaine-prilocaine cream  Commonly known as:  EMLA  Apply 1 application topically as needed (port).     loratadine 10 MG tablet  Commonly known as:  CLARITIN  Take 10 mg by mouth daily.     LORazepam 0.5 MG tablet  Commonly known as:  ATIVAN  Place 1-2  tablets under the tongue or swallow every 4-6 hours as needed for nausea.  Will make drowsy     olmesartan-hydrochlorothiazide 20-12.5 MG per tablet  Commonly known as:  BENICAR HCT  Take 1 tablet by mouth every morning.     omeprazole 40 MG capsule  Commonly known as:  PRILOSEC  Take 1 capsule (40 mg total) by mouth daily.     ondansetron 4 MG disintegrating tablet  Commonly known as:  ZOFRAN-ODT  Take 1-2 tablets (4-8 mg total) by mouth every 8 (eight) hours as needed for nausea or vomiting.     OxyCODONE 15 mg T12a 12 hr tablet  Commonly known as:  OXYCONTIN  Take 1 tablet (15 mg total) by mouth every 12 (twelve) hours.     Oxycodone HCl 10 MG Tabs  Take 1 tablet every 4-6 hours as needed for pain.     warfarin 5 MG tablet  Commonly known as:  COUMADIN  Take 10 mg by mouth every evening.           Follow-up Information   Follow up with Janie Morning, MD On 08/07/2013. (Return @ 11 am)    Specialty:  Obstetrics and Gynecology   Contact information:   [place] [address] [phone]       Signed: Lahoma Crocker 08/04/2013, 1:33 PM

## 2013-08-07 ENCOUNTER — Other Ambulatory Visit (HOSPITAL_BASED_OUTPATIENT_CLINIC_OR_DEPARTMENT_OTHER): Payer: No Typology Code available for payment source

## 2013-08-07 ENCOUNTER — Telehealth: Payer: Self-pay | Admitting: *Deleted

## 2013-08-07 ENCOUNTER — Ambulatory Visit (HOSPITAL_BASED_OUTPATIENT_CLINIC_OR_DEPARTMENT_OTHER): Payer: No Typology Code available for payment source | Admitting: Pharmacist

## 2013-08-07 DIAGNOSIS — I82409 Acute embolism and thrombosis of unspecified deep veins of unspecified lower extremity: Secondary | ICD-10-CM

## 2013-08-07 DIAGNOSIS — C549 Malignant neoplasm of corpus uteri, unspecified: Secondary | ICD-10-CM

## 2013-08-07 DIAGNOSIS — I82401 Acute embolism and thrombosis of unspecified deep veins of right lower extremity: Secondary | ICD-10-CM

## 2013-08-07 LAB — PROTIME-INR
INR: 1.5 — ABNORMAL LOW (ref 2.00–3.50)
Protime: 18 Seconds — ABNORMAL HIGH (ref 10.6–13.4)

## 2013-08-07 LAB — POCT INR: INR: 1.5

## 2013-08-07 NOTE — Patient Instructions (Signed)
Pt will use her last dose of lovenox tomorrow then continue coumadin 10 mg daily.  Recheck her INR with MD visit on 08/16/13 at 9:30 lab, 10:00 MD and 10:30 CC.

## 2013-08-07 NOTE — Telephone Encounter (Signed)
Pt's wound separated, wet to dry dressing done, reviewed with Dr. Delsa Sale. Instructed pt to return tomorrow for a dressing change for area to be reassessed and continue dressing changes.

## 2013-08-07 NOTE — Progress Notes (Signed)
Pt seen in clinic today prior to her appmt with [facility] for staple removal from surgery last week INR=1.5 She was dc'd from hospital on Fri, 5/15 She resumed coumadin at 10 mg daily and will use her last dose of Lovenox tomorrow (Tue) She has been therapeutic on 10 mg daily Gave her #40 Coumadin 10 mg samples lot [id number] Exp 7/16

## 2013-08-07 NOTE — Telephone Encounter (Addendum)
Pt here VS taken, pain assessed 2 out of 10, with pain meds. BP-110/68 P-84R-16 T-98.8,   9 Staples removed

## 2013-08-08 LAB — CA 125: CA 125: 23.9 U/mL (ref 0.0–30.2)

## 2013-08-14 ENCOUNTER — Other Ambulatory Visit: Payer: Self-pay | Admitting: Oncology

## 2013-08-14 DIAGNOSIS — C541 Malignant neoplasm of endometrium: Secondary | ICD-10-CM

## 2013-08-16 ENCOUNTER — Other Ambulatory Visit (HOSPITAL_BASED_OUTPATIENT_CLINIC_OR_DEPARTMENT_OTHER): Payer: No Typology Code available for payment source

## 2013-08-16 ENCOUNTER — Other Ambulatory Visit: Payer: Self-pay | Admitting: *Deleted

## 2013-08-16 ENCOUNTER — Telehealth: Payer: Self-pay | Admitting: Oncology

## 2013-08-16 ENCOUNTER — Encounter: Payer: Self-pay | Admitting: Oncology

## 2013-08-16 ENCOUNTER — Telehealth: Payer: Self-pay | Admitting: *Deleted

## 2013-08-16 ENCOUNTER — Ambulatory Visit: Payer: No Typology Code available for payment source

## 2013-08-16 ENCOUNTER — Ambulatory Visit (HOSPITAL_BASED_OUTPATIENT_CLINIC_OR_DEPARTMENT_OTHER): Payer: No Typology Code available for payment source | Admitting: Oncology

## 2013-08-16 VITALS — BP 114/70 | HR 86 | Temp 98.7°F | Resp 18 | Ht 67.0 in | Wt 196.6 lb

## 2013-08-16 DIAGNOSIS — C787 Secondary malignant neoplasm of liver and intrahepatic bile duct: Secondary | ICD-10-CM

## 2013-08-16 DIAGNOSIS — Z86718 Personal history of other venous thrombosis and embolism: Secondary | ICD-10-CM

## 2013-08-16 DIAGNOSIS — C541 Malignant neoplasm of endometrium: Secondary | ICD-10-CM

## 2013-08-16 DIAGNOSIS — D649 Anemia, unspecified: Secondary | ICD-10-CM

## 2013-08-16 DIAGNOSIS — I82409 Acute embolism and thrombosis of unspecified deep veins of unspecified lower extremity: Secondary | ICD-10-CM

## 2013-08-16 DIAGNOSIS — C562 Malignant neoplasm of left ovary: Secondary | ICD-10-CM

## 2013-08-16 DIAGNOSIS — C55 Malignant neoplasm of uterus, part unspecified: Secondary | ICD-10-CM

## 2013-08-16 DIAGNOSIS — C569 Malignant neoplasm of unspecified ovary: Secondary | ICD-10-CM

## 2013-08-16 DIAGNOSIS — I824Y9 Acute embolism and thrombosis of unspecified deep veins of unspecified proximal lower extremity: Secondary | ICD-10-CM

## 2013-08-16 LAB — CBC WITH DIFFERENTIAL/PLATELET
BASO%: 0.2 % (ref 0.0–2.0)
BASOS ABS: 0 10*3/uL (ref 0.0–0.1)
EOS%: 0.8 % (ref 0.0–7.0)
Eosinophils Absolute: 0.1 10*3/uL (ref 0.0–0.5)
HCT: 30.1 % — ABNORMAL LOW (ref 34.8–46.6)
HEMOGLOBIN: 9.7 g/dL — AB (ref 11.6–15.9)
LYMPH%: 14.6 % (ref 14.0–49.7)
MCH: 31 pg (ref 25.1–34.0)
MCHC: 32.2 g/dL (ref 31.5–36.0)
MCV: 96.2 fL (ref 79.5–101.0)
MONO#: 0.4 10*3/uL (ref 0.1–0.9)
MONO%: 5.9 % (ref 0.0–14.0)
NEUT#: 4.8 10*3/uL (ref 1.5–6.5)
NEUT%: 78.5 % — ABNORMAL HIGH (ref 38.4–76.8)
Platelets: 306 10*3/uL (ref 145–400)
RBC: 3.13 10*6/uL — ABNORMAL LOW (ref 3.70–5.45)
RDW: 16.2 % — AB (ref 11.2–14.5)
WBC: 6.1 10*3/uL (ref 3.9–10.3)
lymph#: 0.9 10*3/uL (ref 0.9–3.3)

## 2013-08-16 LAB — PROTIME-INR
INR: 2.7 (ref 2.00–3.50)
Protime: 32.4 Seconds — ABNORMAL HIGH (ref 10.6–13.4)

## 2013-08-16 LAB — COMPREHENSIVE METABOLIC PANEL (CC13)
ALK PHOS: 74 U/L (ref 40–150)
ALT: 13 U/L (ref 0–55)
AST: 17 U/L (ref 5–34)
Albumin: 3.6 g/dL (ref 3.5–5.0)
Anion Gap: 9 mEq/L (ref 3–11)
BUN: 18 mg/dL (ref 7.0–26.0)
CO2: 26 mEq/L (ref 22–29)
CREATININE: 1.1 mg/dL (ref 0.6–1.1)
Calcium: 9.6 mg/dL (ref 8.4–10.4)
Chloride: 105 mEq/L (ref 98–109)
Glucose: 94 mg/dl (ref 70–140)
POTASSIUM: 4.1 meq/L (ref 3.5–5.1)
Sodium: 140 mEq/L (ref 136–145)
Total Bilirubin: 0.42 mg/dL (ref 0.20–1.20)
Total Protein: 8.6 g/dL — ABNORMAL HIGH (ref 6.4–8.3)

## 2013-08-16 LAB — POCT INR: INR: 2.7

## 2013-08-16 MED ORDER — ALLOPURINOL 100 MG PO TABS: 100.0000 mg | ORAL_TABLET | Freq: Every day | ORAL | Status: DC

## 2013-08-16 MED ORDER — HEPARIN SOD (PORK) LOCK FLUSH 100 UNIT/ML IV SOLN
500.0000 [IU] | Freq: Once | INTRAVENOUS | Status: AC
  Administered 2013-08-16: 500 [IU] via INTRAVENOUS
  Filled 2013-08-16: qty 5

## 2013-08-16 MED ORDER — FERROUS FUMARATE 325 (106 FE) MG PO TABS: 1.0000 | ORAL_TABLET | Freq: Every day | ORAL | Status: DC

## 2013-08-16 MED ORDER — DEXAMETHASONE 4 MG PO TABS: ORAL_TABLET | ORAL | Status: DC

## 2013-08-16 MED ORDER — OMEPRAZOLE 40 MG PO CPDR: 40.0000 mg | DELAYED_RELEASE_CAPSULE | Freq: Every day | ORAL | Status: DC

## 2013-08-16 MED ORDER — ONDANSETRON 4 MG PO TBDP: 4.0000 mg | ORAL_TABLET | Freq: Three times a day (TID) | ORAL | Status: DC | PRN

## 2013-08-16 MED ORDER — SODIUM CHLORIDE 0.9 % IJ SOLN
10.0000 mL | INTRAMUSCULAR | Status: DC | PRN
  Administered 2013-08-16: 10 mL via INTRAVENOUS
  Filled 2013-08-16: qty 10
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
## 2013-08-16 NOTE — Telephone Encounter (Signed)
Per staff message and POF I have scheduled appts.  JMW  

## 2013-08-16 NOTE — Telephone Encounter (Signed)
, °

## 2013-08-17 ENCOUNTER — Ambulatory Visit: Payer: No Typology Code available for payment source | Admitting: Pharmacist

## 2013-08-17 NOTE — Progress Notes (Signed)
Anticoag was addressed during her MD visit No need for change as INR in range at 2.7 Will see during  infusion on 6/2 Confirmed this infor over phone with patient

## 2013-08-17 NOTE — Patient Instructions (Signed)
Coumadin 10 mg daily.  Recheck her INR with visit on 08/22/13

## 2013-08-18 ENCOUNTER — Other Ambulatory Visit: Payer: Self-pay | Admitting: Oncology

## 2013-08-21 ENCOUNTER — Telehealth: Payer: Self-pay | Admitting: Oncology

## 2013-08-21 NOTE — Telephone Encounter (Signed)
S/w the pt and she is aware of her injection appt on 08/28/2013.

## 2013-08-22 ENCOUNTER — Ambulatory Visit (HOSPITAL_BASED_OUTPATIENT_CLINIC_OR_DEPARTMENT_OTHER): Payer: No Typology Code available for payment source | Admitting: Pharmacist

## 2013-08-22 ENCOUNTER — Other Ambulatory Visit (HOSPITAL_BASED_OUTPATIENT_CLINIC_OR_DEPARTMENT_OTHER): Payer: No Typology Code available for payment source

## 2013-08-22 ENCOUNTER — Ambulatory Visit (HOSPITAL_BASED_OUTPATIENT_CLINIC_OR_DEPARTMENT_OTHER): Payer: No Typology Code available for payment source

## 2013-08-22 ENCOUNTER — Ambulatory Visit: Payer: No Typology Code available for payment source

## 2013-08-22 VITALS — BP 110/82 | HR 90 | Temp 97.9°F | Resp 18

## 2013-08-22 DIAGNOSIS — I824Y9 Acute embolism and thrombosis of unspecified deep veins of unspecified proximal lower extremity: Secondary | ICD-10-CM

## 2013-08-22 DIAGNOSIS — C787 Secondary malignant neoplasm of liver and intrahepatic bile duct: Secondary | ICD-10-CM

## 2013-08-22 DIAGNOSIS — C569 Malignant neoplasm of unspecified ovary: Secondary | ICD-10-CM

## 2013-08-22 DIAGNOSIS — C55 Malignant neoplasm of uterus, part unspecified: Secondary | ICD-10-CM

## 2013-08-22 DIAGNOSIS — C562 Malignant neoplasm of left ovary: Secondary | ICD-10-CM

## 2013-08-22 DIAGNOSIS — Z5111 Encounter for antineoplastic chemotherapy: Secondary | ICD-10-CM

## 2013-08-22 DIAGNOSIS — Z95828 Presence of other vascular implants and grafts: Secondary | ICD-10-CM

## 2013-08-22 DIAGNOSIS — Z86718 Personal history of other venous thrombosis and embolism: Secondary | ICD-10-CM

## 2013-08-22 DIAGNOSIS — C541 Malignant neoplasm of endometrium: Secondary | ICD-10-CM

## 2013-08-22 LAB — CBC WITH DIFFERENTIAL/PLATELET
BASO%: 0.1 % (ref 0.0–2.0)
Basophils Absolute: 0 10*3/uL (ref 0.0–0.1)
EOS%: 0 % (ref 0.0–7.0)
Eosinophils Absolute: 0 10*3/uL (ref 0.0–0.5)
HCT: 29 % — ABNORMAL LOW (ref 34.8–46.6)
HGB: 9.7 g/dL — ABNORMAL LOW (ref 11.6–15.9)
LYMPH#: 0.4 10*3/uL — AB (ref 0.9–3.3)
LYMPH%: 8.7 % — ABNORMAL LOW (ref 14.0–49.7)
MCH: 31.5 pg (ref 25.1–34.0)
MCHC: 33.3 g/dL (ref 31.5–36.0)
MCV: 94.8 fL (ref 79.5–101.0)
MONO#: 0.1 10*3/uL (ref 0.1–0.9)
MONO%: 1.2 % (ref 0.0–14.0)
NEUT#: 4.1 10*3/uL (ref 1.5–6.5)
NEUT%: 90 % — ABNORMAL HIGH (ref 38.4–76.8)
Platelets: 283 10*3/uL (ref 145–400)
RBC: 3.06 10*6/uL — AB (ref 3.70–5.45)
RDW: 17 % — ABNORMAL HIGH (ref 11.2–14.5)
WBC: 4.5 10*3/uL (ref 3.9–10.3)

## 2013-08-22 LAB — COMPREHENSIVE METABOLIC PANEL (CC13)
ALBUMIN: 3.7 g/dL (ref 3.5–5.0)
ALK PHOS: 73 U/L (ref 40–150)
ALT: 10 U/L (ref 0–55)
AST: 17 U/L (ref 5–34)
Anion Gap: 14 mEq/L — ABNORMAL HIGH (ref 3–11)
BUN: 27.3 mg/dL — ABNORMAL HIGH (ref 7.0–26.0)
CHLORIDE: 104 meq/L (ref 98–109)
CO2: 20 mEq/L — ABNORMAL LOW (ref 22–29)
Calcium: 9.7 mg/dL (ref 8.4–10.4)
Creatinine: 1.2 mg/dL — ABNORMAL HIGH (ref 0.6–1.1)
GLUCOSE: 115 mg/dL (ref 70–140)
POTASSIUM: 3.9 meq/L (ref 3.5–5.1)
SODIUM: 138 meq/L (ref 136–145)
TOTAL PROTEIN: 9.3 g/dL — AB (ref 6.4–8.3)
Total Bilirubin: 0.41 mg/dL (ref 0.20–1.20)

## 2013-08-22 LAB — POCT INR: INR: 3.4

## 2013-08-22 LAB — PROTIME-INR
INR: 3.4 (ref 2.00–3.50)
Protime: 40.8 s — ABNORMAL HIGH (ref 10.6–13.4)

## 2013-08-22 MED ORDER — DIPHENHYDRAMINE HCL 50 MG/ML IJ SOLN
50.0000 mg | Freq: Once | INTRAMUSCULAR | Status: AC
  Administered 2013-08-22: 50 mg via INTRAVENOUS

## 2013-08-22 MED ORDER — DEXAMETHASONE SODIUM PHOSPHATE 20 MG/5ML IJ SOLN
INTRAMUSCULAR | Status: AC
  Filled 2013-08-22: qty 5

## 2013-08-22 MED ORDER — FAMOTIDINE IN NACL 20-0.9 MG/50ML-% IV SOLN
20.0000 mg | Freq: Once | INTRAVENOUS | Status: AC
  Administered 2013-08-22: 20 mg via INTRAVENOUS

## 2013-08-22 MED ORDER — PACLITAXEL CHEMO INJECTION 300 MG/50ML
175.0000 mg/m2 | Freq: Once | INTRAVENOUS | Status: AC
  Administered 2013-08-22: 360 mg via INTRAVENOUS
  Filled 2013-08-22: qty 60

## 2013-08-22 MED ORDER — DIPHENHYDRAMINE HCL 50 MG/ML IJ SOLN
INTRAMUSCULAR | Status: AC
  Filled 2013-08-22: qty 1

## 2013-08-22 MED ORDER — FAMOTIDINE IN NACL 20-0.9 MG/50ML-% IV SOLN
INTRAVENOUS | Status: AC
  Filled 2013-08-22: qty 50

## 2013-08-22 MED ORDER — HEPARIN SOD (PORK) LOCK FLUSH 100 UNIT/ML IV SOLN
500.0000 [IU] | Freq: Once | INTRAVENOUS | Status: AC
  Administered 2013-08-22: 500 [IU] via INTRAVENOUS
  Filled 2013-08-22: qty 5

## 2013-08-22 MED ORDER — ONDANSETRON 16 MG/50ML IVPB (CHCC)
INTRAVENOUS | Status: AC
  Filled 2013-08-22: qty 16

## 2013-08-22 MED ORDER — HEPARIN SOD (PORK) LOCK FLUSH 100 UNIT/ML IV SOLN
500.0000 [IU] | Freq: Once | INTRAVENOUS | Status: AC | PRN
  Administered 2013-08-22: 500 [IU]
  Filled 2013-08-22: qty 5

## 2013-08-22 MED ORDER — SODIUM CHLORIDE 0.9 % IJ SOLN
10.0000 mL | INTRAMUSCULAR | Status: DC | PRN
  Administered 2013-08-22: 10 mL via INTRAVENOUS
  Filled 2013-08-22: qty 10

## 2013-08-22 MED ORDER — SODIUM CHLORIDE 0.9 % IV SOLN
330.0000 mg | Freq: Once | INTRAVENOUS | Status: AC
  Administered 2013-08-22: 330 mg via INTRAVENOUS
  Filled 2013-08-22: qty 33

## 2013-08-22 MED ORDER — ONDANSETRON 16 MG/50ML IVPB (CHCC)
16.0000 mg | Freq: Once | INTRAVENOUS | Status: AC
  Administered 2013-08-22: 16 mg via INTRAVENOUS

## 2013-08-22 MED ORDER — SODIUM CHLORIDE 0.9 % IJ SOLN
10.0000 mL | INTRAMUSCULAR | Status: DC | PRN
  Administered 2013-08-22: 10 mL
  Filled 2013-08-22: qty 10

## 2013-08-22 MED ORDER — SODIUM CHLORIDE 0.9 % IV SOLN
Freq: Once | INTRAVENOUS | Status: AC
  Administered 2013-08-22: 14:00:00 via INTRAVENOUS

## 2013-08-22 MED ORDER — DEXAMETHASONE SODIUM PHOSPHATE 20 MG/5ML IJ SOLN
20.0000 mg | Freq: Once | INTRAMUSCULAR | Status: AC
  Administered 2013-08-22: 20 mg via INTRAVENOUS

## 2013-08-22 NOTE — Progress Notes (Signed)
INR above goal of 2-3 today.  H/H=9.7/29.  Plt = 283000.  No bleeding/bruising,  No changes in medication.  Appetite is good.  [name] is receiving chemo today.  [name] has been on coumadin 10mg  daily for 3 months.  I have instructed her to hold coumadin today, then resume coumadin 10mg  daily.  Will check PT/INR next week when [name] is at [facility] for Neulasta injection.

## 2013-08-22 NOTE — Patient Instructions (Signed)
[facility] Discharge Instructions for Patients Receiving Chemotherapy  Today you received the following chemotherapy agents:  Taxol and Carboplatin  To help prevent nausea and vomiting after your treatment, we encourage you to take your nausea medication as ordered per MD.   If you develop nausea and vomiting that is not controlled by your nausea medication, call the clinic.   BELOW ARE SYMPTOMS THAT SHOULD BE REPORTED IMMEDIATELY:  *FEVER GREATER THAN 100.5 F  *CHILLS WITH OR WITHOUT FEVER  NAUSEA AND VOMITING THAT IS NOT CONTROLLED WITH YOUR NAUSEA MEDICATION  *UNUSUAL SHORTNESS OF BREATH  *UNUSUAL BRUISING OR BLEEDING  TENDERNESS IN MOUTH AND THROAT WITH OR WITHOUT PRESENCE OF ULCERS  *URINARY PROBLEMS  *BOWEL PROBLEMS  UNUSUAL RASH Items with * indicate a potential emergency and should be followed up as soon as possible.  Feel free to call the clinic you have any questions or concerns. The clinic phone number is [phone].    

## 2013-08-23 ENCOUNTER — Telehealth: Payer: Self-pay | Admitting: *Deleted

## 2013-08-23 LAB — CA 125: CA 125: 9.8 U/mL (ref 0.0–30.2)

## 2013-08-23 NOTE — Telephone Encounter (Signed)
Pt states she forgot to ask Dr Marko Plume if the tumor was removed from her left side.

## 2013-08-25 ENCOUNTER — Telehealth: Payer: Self-pay

## 2013-08-25 NOTE — Telephone Encounter (Signed)
[name] noticed opening ~2-3 hours ago.  The ares is not swollen or red.  A clear to grey drainage can be expressed from the opening.  She is afebrile. Spoke with Caitlyn Magic RN with Gyn ONC.  She will take a look at the site when Caitlyn Higgins is finished with her appointments on 08-28-13.  She is to call the office or go to ED if she develops a fever of 101.5 or greater and if the drainage becomes green/white pus/ Open area becomes larger.  [name] verbalized understanding.

## 2013-08-28 ENCOUNTER — Other Ambulatory Visit (HOSPITAL_BASED_OUTPATIENT_CLINIC_OR_DEPARTMENT_OTHER): Payer: No Typology Code available for payment source

## 2013-08-28 ENCOUNTER — Ambulatory Visit (HOSPITAL_BASED_OUTPATIENT_CLINIC_OR_DEPARTMENT_OTHER): Payer: No Typology Code available for payment source | Admitting: Pharmacist

## 2013-08-28 ENCOUNTER — Ambulatory Visit (HOSPITAL_BASED_OUTPATIENT_CLINIC_OR_DEPARTMENT_OTHER): Payer: No Typology Code available for payment source

## 2013-08-28 VITALS — BP 103/56 | HR 82 | Temp 97.9°F

## 2013-08-28 DIAGNOSIS — Z5189 Encounter for other specified aftercare: Secondary | ICD-10-CM

## 2013-08-28 DIAGNOSIS — I824Y9 Acute embolism and thrombosis of unspecified deep veins of unspecified proximal lower extremity: Secondary | ICD-10-CM

## 2013-08-28 DIAGNOSIS — I82401 Acute embolism and thrombosis of unspecified deep veins of right lower extremity: Secondary | ICD-10-CM

## 2013-08-28 DIAGNOSIS — C541 Malignant neoplasm of endometrium: Secondary | ICD-10-CM

## 2013-08-28 DIAGNOSIS — C549 Malignant neoplasm of corpus uteri, unspecified: Secondary | ICD-10-CM

## 2013-08-28 LAB — PROTIME-INR
INR: 3 (ref 2.00–3.50)
Protime: 36 Seconds — ABNORMAL HIGH (ref 10.6–13.4)

## 2013-08-28 LAB — POCT INR: INR: 3

## 2013-08-28 MED ORDER — PEGFILGRASTIM INJECTION 6 MG/0.6ML
6.0000 mg | Freq: Once | SUBCUTANEOUS | Status: AC
  Administered 2013-08-28: 6 mg via SUBCUTANEOUS
  Filled 2013-08-28: qty 0.6

## 2013-08-28 NOTE — Progress Notes (Signed)
INR = 3 after holding x 1 dose last Tues then continuing 10 mg Coumadin daily. She has open area at incision site that is above her navel.  She called RN about this & would like to be seen today to evaluate the area.  Stanton Kidney, RN is seeing pt. There has been a small amt of bleeding this AM at the wound.  It drains clear fluid & is somewhat soft.  No warmth at the site.  She has been afebrile. INR at goal.  I'll change pts Coumadin to 5 mg on Mondays & 10 mg other days. Repeat protime in 11 days when she will be here 6/19 for lab/MD visit. [name], Pharm.D., CPP 08/28/2013@11 :42 AM

## 2013-09-05 ENCOUNTER — Other Ambulatory Visit: Payer: Self-pay | Admitting: Oncology

## 2013-09-05 DIAGNOSIS — I82409 Acute embolism and thrombosis of unspecified deep veins of unspecified lower extremity: Secondary | ICD-10-CM

## 2013-09-05 DIAGNOSIS — C541 Malignant neoplasm of endometrium: Secondary | ICD-10-CM

## 2013-09-08 ENCOUNTER — Telehealth: Payer: Self-pay | Admitting: *Deleted

## 2013-09-08 ENCOUNTER — Ambulatory Visit: Payer: No Typology Code available for payment source | Admitting: Pharmacist

## 2013-09-08 ENCOUNTER — Other Ambulatory Visit (HOSPITAL_BASED_OUTPATIENT_CLINIC_OR_DEPARTMENT_OTHER): Payer: No Typology Code available for payment source

## 2013-09-08 ENCOUNTER — Encounter: Payer: Self-pay | Admitting: Oncology

## 2013-09-08 ENCOUNTER — Ambulatory Visit (HOSPITAL_BASED_OUTPATIENT_CLINIC_OR_DEPARTMENT_OTHER): Payer: No Typology Code available for payment source | Admitting: Oncology

## 2013-09-08 ENCOUNTER — Telehealth: Payer: Self-pay | Admitting: Oncology

## 2013-09-08 VITALS — BP 117/78 | HR 88 | Temp 98.1°F | Resp 16 | Ht 67.0 in | Wt 200.6 lb

## 2013-09-08 DIAGNOSIS — I82409 Acute embolism and thrombosis of unspecified deep veins of unspecified lower extremity: Secondary | ICD-10-CM

## 2013-09-08 DIAGNOSIS — D649 Anemia, unspecified: Secondary | ICD-10-CM

## 2013-09-08 DIAGNOSIS — K219 Gastro-esophageal reflux disease without esophagitis: Secondary | ICD-10-CM

## 2013-09-08 DIAGNOSIS — I2699 Other pulmonary embolism without acute cor pulmonale: Secondary | ICD-10-CM

## 2013-09-08 DIAGNOSIS — Z803 Family history of malignant neoplasm of breast: Secondary | ICD-10-CM

## 2013-09-08 DIAGNOSIS — C569 Malignant neoplasm of unspecified ovary: Secondary | ICD-10-CM

## 2013-09-08 DIAGNOSIS — I824Y9 Acute embolism and thrombosis of unspecified deep veins of unspecified proximal lower extremity: Secondary | ICD-10-CM

## 2013-09-08 DIAGNOSIS — I10 Essential (primary) hypertension: Secondary | ICD-10-CM

## 2013-09-08 DIAGNOSIS — C539 Malignant neoplasm of cervix uteri, unspecified: Secondary | ICD-10-CM

## 2013-09-08 DIAGNOSIS — C787 Secondary malignant neoplasm of liver and intrahepatic bile duct: Secondary | ICD-10-CM

## 2013-09-08 DIAGNOSIS — C541 Malignant neoplasm of endometrium: Secondary | ICD-10-CM

## 2013-09-08 DIAGNOSIS — C562 Malignant neoplasm of left ovary: Secondary | ICD-10-CM

## 2013-09-08 DIAGNOSIS — Z809 Family history of malignant neoplasm, unspecified: Secondary | ICD-10-CM

## 2013-09-08 DIAGNOSIS — N289 Disorder of kidney and ureter, unspecified: Secondary | ICD-10-CM

## 2013-09-08 LAB — CBC WITH DIFFERENTIAL/PLATELET
BASO%: 0.7 % (ref 0.0–2.0)
Basophils Absolute: 0.1 10*3/uL (ref 0.0–0.1)
EOS%: 0.2 % (ref 0.0–7.0)
Eosinophils Absolute: 0 10*3/uL (ref 0.0–0.5)
HCT: 27.2 % — ABNORMAL LOW (ref 34.8–46.6)
HGB: 8.9 g/dL — ABNORMAL LOW (ref 11.6–15.9)
LYMPH#: 0.9 10*3/uL (ref 0.9–3.3)
LYMPH%: 10.5 % — AB (ref 14.0–49.7)
MCH: 31.7 pg (ref 25.1–34.0)
MCHC: 32.8 g/dL (ref 31.5–36.0)
MCV: 96.5 fL (ref 79.5–101.0)
MONO#: 0.7 10*3/uL (ref 0.1–0.9)
MONO%: 7.5 % (ref 0.0–14.0)
NEUT#: 7.3 10*3/uL — ABNORMAL HIGH (ref 1.5–6.5)
NEUT%: 81.1 % — ABNORMAL HIGH (ref 38.4–76.8)
PLATELETS: 148 10*3/uL (ref 145–400)
RBC: 2.82 10*6/uL — AB (ref 3.70–5.45)
RDW: 16.5 % — ABNORMAL HIGH (ref 11.2–14.5)
WBC: 9 10*3/uL (ref 3.9–10.3)

## 2013-09-08 LAB — COMPREHENSIVE METABOLIC PANEL (CC13)
ALT: 14 U/L (ref 0–55)
AST: 17 U/L (ref 5–34)
Albumin: 3.5 g/dL (ref 3.5–5.0)
Alkaline Phosphatase: 82 U/L (ref 40–150)
Anion Gap: 9 mEq/L (ref 3–11)
BILIRUBIN TOTAL: 0.35 mg/dL (ref 0.20–1.20)
BUN: 16.8 mg/dL (ref 7.0–26.0)
CO2: 28 mEq/L (ref 22–29)
Calcium: 9.2 mg/dL (ref 8.4–10.4)
Chloride: 105 mEq/L (ref 98–109)
Creatinine: 1.1 mg/dL (ref 0.6–1.1)
Glucose: 99 mg/dl (ref 70–140)
Potassium: 3.8 mEq/L (ref 3.5–5.1)
Sodium: 142 mEq/L (ref 136–145)
Total Protein: 8.3 g/dL (ref 6.4–8.3)

## 2013-09-08 LAB — PROTIME-INR
INR: 2.1 (ref 2.00–3.50)
Protime: 25.2 Seconds — ABNORMAL HIGH (ref 10.6–13.4)

## 2013-09-08 LAB — POCT INR: INR: 2.1

## 2013-09-08 MED ORDER — OMEPRAZOLE 40 MG PO CPDR: 40.0000 mg | DELAYED_RELEASE_CAPSULE | Freq: Every day | ORAL | Status: DC

## 2013-09-08 MED ORDER — OXYCODONE HCL 10 MG PO TABS: ORAL_TABLET | ORAL | Status: DC

## 2013-09-08 MED ORDER — OXYCODONE HCL ER 15 MG PO T12A
15.0000 mg | EXTENDED_RELEASE_TABLET | Freq: Two times a day (BID) | ORAL | Status: DC
Start: 2013-09-08 — End: 2013-11-13

## 2013-09-08 NOTE — Progress Notes (Signed)
[place], [id number] approved oycontin 15mg  from 09/08/13-09/09/14

## 2013-09-08 NOTE — Telephone Encounter (Signed)
per pof to sch pt appt-move Brewster to 7/23-appt moved-sent email to MW to sch chemo-sch coum clinic-adv pt that [facility] would sch appt for CT-printed & gave pt sch

## 2013-09-08 NOTE — Patient Instructions (Signed)
INR at goal Change your Coumadin back to 10 mg daily.   Recheck her INR with visit on 09/26/13.  Lab at 9:30 am, coumadin clinic at 9:45 am

## 2013-09-08 NOTE — Progress Notes (Signed)
INR at goal Pt is doing well today with no complaints No unusual bleeding or bruising No missed or extra doses No medication or diet changes Pt to see Dr. Marko Plume this morning as well Pt prefers to take the same dose everyday for convenience. Will try to go back to 10 mg daily If INR above goal at next visit will need to adjust Plan: Change your Coumadin back to 10 mg daily.   Recheck her INR with visit on 09/26/13.  Lab at 9:30 am, coumadin clinic at 9:45 am

## 2013-09-08 NOTE — Telephone Encounter (Signed)
Per staff message and POF I have scheduled appts. Advised scheduler of appts. JMW  
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
## 2013-09-09 DIAGNOSIS — C562 Malignant neoplasm of left ovary: Secondary | ICD-10-CM | POA: Insufficient documentation

## 2013-09-13 ENCOUNTER — Telehealth: Payer: Self-pay | Admitting: Oncology

## 2013-09-13 NOTE — Telephone Encounter (Signed)
cld pt to adv of trmt sch & appts-pt stated will stop by to get updated copy of sch 09/14/13

## 2013-09-14 ENCOUNTER — Other Ambulatory Visit: Payer: No Typology Code available for payment source

## 2013-09-14 ENCOUNTER — Ambulatory Visit: Payer: No Typology Code available for payment source

## 2013-09-14 ENCOUNTER — Encounter: Payer: Self-pay | Admitting: Genetic Counselor

## 2013-09-14 ENCOUNTER — Ambulatory Visit: Payer: No Typology Code available for payment source | Admitting: Gynecologic Oncology

## 2013-09-14 ENCOUNTER — Ambulatory Visit (HOSPITAL_BASED_OUTPATIENT_CLINIC_OR_DEPARTMENT_OTHER): Payer: No Typology Code available for payment source | Admitting: Genetic Counselor

## 2013-09-14 ENCOUNTER — Ambulatory Visit (HOSPITAL_BASED_OUTPATIENT_CLINIC_OR_DEPARTMENT_OTHER): Payer: No Typology Code available for payment source

## 2013-09-14 VITALS — BP 102/63 | HR 72 | Temp 97.1°F

## 2013-09-14 DIAGNOSIS — C549 Malignant neoplasm of corpus uteri, unspecified: Secondary | ICD-10-CM

## 2013-09-14 DIAGNOSIS — C569 Malignant neoplasm of unspecified ovary: Secondary | ICD-10-CM

## 2013-09-14 DIAGNOSIS — Z95828 Presence of other vascular implants and grafts: Secondary | ICD-10-CM

## 2013-09-14 DIAGNOSIS — I82401 Acute embolism and thrombosis of unspecified deep veins of right lower extremity: Secondary | ICD-10-CM

## 2013-09-14 DIAGNOSIS — Z803 Family history of malignant neoplasm of breast: Secondary | ICD-10-CM | POA: Insufficient documentation

## 2013-09-14 DIAGNOSIS — IMO0002 Reserved for concepts with insufficient information to code with codable children: Secondary | ICD-10-CM

## 2013-09-14 DIAGNOSIS — C541 Malignant neoplasm of endometrium: Secondary | ICD-10-CM

## 2013-09-14 DIAGNOSIS — C562 Malignant neoplasm of left ovary: Secondary | ICD-10-CM

## 2013-09-14 MED ORDER — SODIUM CHLORIDE 0.9 % IJ SOLN
10.0000 mL | INTRAMUSCULAR | Status: DC | PRN
  Filled 2013-09-14: qty 10

## 2013-09-14 MED ORDER — HEPARIN SOD (PORK) LOCK FLUSH 100 UNIT/ML IV SOLN
500.0000 [IU] | Freq: Once | INTRAVENOUS | Status: AC
  Administered 2013-09-14: 500 [IU] via INTRAVENOUS
  Filled 2013-09-14: qty 5

## 2013-09-14 NOTE — Patient Instructions (Signed)

## 2013-09-14 NOTE — Progress Notes (Signed)
Patient Name: Caitlyn Higgins Patient Age: 48 y.o. Encounter Date: 09/14/2013  Referring Physician: Evlyn Clines, MD  Primary Care Cashay Manganelli: Shirline Frees, MD   [name], a 48 y.o. female, is being seen at the [facility] due to a personal history of both uterine and ovarian cancers and family history of breast cancer.  She presents to clinic today with her sister, Peter Congo, to discuss the possibility of a hereditary predisposition to cancer and discuss whether genetic testing is warranted.  HISTORY OF PRESENT ILLNESS: [name] was diagnosed with uterine cancer (carcinosarcoma) in 02/2013 at the age of 12. She had neoadjuvant chemotherapy and proceeded to have surgery in 07/2013. Pathology of her ovaries revealed a primary ovarian cancer (clear cell endometrioid). She proceeded to have additional chemotherapy.   [name] reports that she has never had a mammogram. She has not yet initiated colonoscopic screening.  Past Medical History  Diagnosis Date  . Reflux   . Hypertension   . Cancer     uterine carcinosarcoma  . DVT (deep venous thrombosis)     right leg 12'14  . Pulmonary emboli     12'14 denies any sob  . Elevated blood uric acid level     tx. Allopurinol  . GERD (gastroesophageal reflux disease)   . Transfusion history 07-26-13    12'14, 04-14-13(2 units), 06-09-13  . Anemia   . Family history of malignant neoplasm of breast   . Ovarian cancer 2015    Past Surgical History  Procedure Laterality Date  . No past surgeries    . Portacath placement Right     rigth chest- remains  . Ivc Right     filter placed to prevent Pulmonary emboli  . Abdominal hysterectomy N/A 08/01/2013    Procedure: EXPLORATORY LAPAROTOMY/HYSTERECTOMY TOTAL ABDOMINAL;  Surgeon: Janie Morning, MD;  Location: [facility];  Service: Gynecology;  Laterality: N/A;  . Salpingoophorectomy Bilateral 08/01/2013    Procedure: BILATERAL SALPINGO OOPHORECTOMY/OMENTECTOMY ;   Surgeon: Janie Morning, MD;  Location: [facility];  Service: Gynecology;  Laterality: Bilateral;    History   Social History  . Marital Status: Single    Spouse Name: N/A    Number of Children: N/A  . Years of Education: N/A   Social History Main Topics  . Smoking status: Never Smoker   . Smokeless tobacco: Not on file  . Alcohol Use: No     Comment: Quit  2 yrs ago-social in past  . Drug Use: No  . Sexual Activity: Not Currently   Other Topics Concern  . Not on file   Social History Narrative  . No narrative on file     FAMILY HISTORY:   During the visit, a 4-generation pedigree was obtained. Significant diagnoses include the following:  Family History  Problem Relation Age of Onset  . Diabetes Mother   . Hypertension Mother   . Hypertension Father   . Diabetes Father   . Prostate cancer Father 6    deceased 28  . Breast cancer Maternal Aunt 74    deceased 79  . Prostate cancer Maternal Uncle     5 of 6 mat uncles with prostate cancer; deceased  . Breast cancer Paternal Aunt 55    deceased 41  . Stomach cancer Paternal Grandmother     deceased 1  . Breast cancer Maternal Aunt 57    deceased 31  . Breast cancer Cousin 60    negative BRCAplus, PALB2 2015  .  Breast cancer Paternal Aunt 37    deceased 54    Additionally, has no children. Both her sisters (age 30 and 80) had a hysterectomy at very young ages due to benign reasons. Her cousin who had breast cancer in her 56s was evaluated here. She had negative genetic testing of BRCA1, BRCA2, CDH1, PALB2, PTEN, and TP53.  Ms. Higgins's ancestry is African American. There is no known Jewish ancestry and no consanguinity.  ASSESSMENT AND PLAN: [name] is a 48 y.o. female with a personal history of both uterine and ovarian cancers and a family history of breast cancer in both maternal and paternal relatives. Her own personal history is suggestive of a hereditary predisposition to cancer, specifically BRCA or  Lynch syndrome, although the rest of the family history is not typical of either of these. We reviewed the characteristics, features and inheritance patterns of hereditary cancer syndromes. We also discussed genetic testing, including the appropriate family members to test, the process of testing, insurance coverage and implications of results. A negative result will not be reassuring and will need to be interpreted with caution.  [name] wished to pursue genetic testing and a blood sample will be sent to [facility] for analysis of 24 genes on the OvaNext gene panel. We discussed the implications of a positive, negative and/ or Variant of Uncertain Significance (VUS) result. Results should be available in approximately 5-6 weeks, at which point we will contact her and address implications for her as well as address genetic testing for at-risk family members, if needed.    We encouraged Ms. Higgins to remain in contact with Cancer Genetics annually so that we can update the family history and inform her of any changes in cancer genetics and testing that may be of benefit for this family. Ms.  Higgins's questions were answered to her satisfaction today.   Thank you for the referral and allowing [place] to share in the care of your patient.   The patient was seen for a total of 30 minutes, greater than 50% of which was spent face-to-face counseling. This patient was discussed with the overseeing Thelda Gagan who agrees with the above.

## 2013-09-25 ENCOUNTER — Other Ambulatory Visit: Payer: Self-pay | Admitting: Oncology

## 2013-09-25 ENCOUNTER — Encounter: Payer: Self-pay | Admitting: Pharmacist

## 2013-09-26 ENCOUNTER — Other Ambulatory Visit (HOSPITAL_BASED_OUTPATIENT_CLINIC_OR_DEPARTMENT_OTHER): Payer: No Typology Code available for payment source

## 2013-09-26 ENCOUNTER — Other Ambulatory Visit: Payer: No Typology Code available for payment source

## 2013-09-26 ENCOUNTER — Ambulatory Visit: Payer: No Typology Code available for payment source

## 2013-09-26 ENCOUNTER — Ambulatory Visit (HOSPITAL_BASED_OUTPATIENT_CLINIC_OR_DEPARTMENT_OTHER): Payer: No Typology Code available for payment source

## 2013-09-26 ENCOUNTER — Ambulatory Visit: Payer: No Typology Code available for payment source | Admitting: Pharmacist

## 2013-09-26 VITALS — BP 100/59 | HR 81 | Temp 97.8°F

## 2013-09-26 DIAGNOSIS — C787 Secondary malignant neoplasm of liver and intrahepatic bile duct: Secondary | ICD-10-CM

## 2013-09-26 DIAGNOSIS — C50919 Malignant neoplasm of unspecified site of unspecified female breast: Secondary | ICD-10-CM

## 2013-09-26 DIAGNOSIS — Z5111 Encounter for antineoplastic chemotherapy: Secondary | ICD-10-CM

## 2013-09-26 DIAGNOSIS — I82401 Acute embolism and thrombosis of unspecified deep veins of right lower extremity: Secondary | ICD-10-CM

## 2013-09-26 DIAGNOSIS — C55 Malignant neoplasm of uterus, part unspecified: Secondary | ICD-10-CM

## 2013-09-26 DIAGNOSIS — Z95828 Presence of other vascular implants and grafts: Secondary | ICD-10-CM

## 2013-09-26 DIAGNOSIS — C541 Malignant neoplasm of endometrium: Secondary | ICD-10-CM

## 2013-09-26 DIAGNOSIS — I824Y9 Acute embolism and thrombosis of unspecified deep veins of unspecified proximal lower extremity: Secondary | ICD-10-CM

## 2013-09-26 DIAGNOSIS — C562 Malignant neoplasm of left ovary: Secondary | ICD-10-CM

## 2013-09-26 DIAGNOSIS — C779 Secondary and unspecified malignant neoplasm of lymph node, unspecified: Secondary | ICD-10-CM

## 2013-09-26 LAB — COMPREHENSIVE METABOLIC PANEL (CC13)
ALT: 15 U/L (ref 0–55)
ANION GAP: 7 meq/L (ref 3–11)
AST: 19 U/L (ref 5–34)
Albumin: 3.7 g/dL (ref 3.5–5.0)
Alkaline Phosphatase: 79 U/L (ref 40–150)
BILIRUBIN TOTAL: 0.39 mg/dL (ref 0.20–1.20)
BUN: 25.8 mg/dL (ref 7.0–26.0)
CO2: 25 meq/L (ref 22–29)
CREATININE: 1.2 mg/dL — AB (ref 0.6–1.1)
Calcium: 9.6 mg/dL (ref 8.4–10.4)
Chloride: 103 mEq/L (ref 98–109)
GLUCOSE: 130 mg/dL (ref 70–140)
Potassium: 4.3 mEq/L (ref 3.5–5.1)
Sodium: 136 mEq/L (ref 136–145)
TOTAL PROTEIN: 8.8 g/dL — AB (ref 6.4–8.3)

## 2013-09-26 LAB — CBC WITH DIFFERENTIAL/PLATELET
BASO%: 0.1 % (ref 0.0–2.0)
Basophils Absolute: 0 10*3/uL (ref 0.0–0.1)
EOS%: 0 % (ref 0.0–7.0)
Eosinophils Absolute: 0 10*3/uL (ref 0.0–0.5)
HCT: 27.3 % — ABNORMAL LOW (ref 34.8–46.6)
HGB: 9.1 g/dL — ABNORMAL LOW (ref 11.6–15.9)
LYMPH%: 8.3 % — AB (ref 14.0–49.7)
MCH: 32.4 pg (ref 25.1–34.0)
MCHC: 33.3 g/dL (ref 31.5–36.0)
MCV: 97.2 fL (ref 79.5–101.0)
MONO#: 0 10*3/uL — ABNORMAL LOW (ref 0.1–0.9)
MONO%: 0.7 % (ref 0.0–14.0)
NEUT#: 3.9 10*3/uL (ref 1.5–6.5)
NEUT%: 90.9 % — ABNORMAL HIGH (ref 38.4–76.8)
Platelets: 283 10*3/uL (ref 145–400)
RBC: 2.81 10*6/uL — AB (ref 3.70–5.45)
RDW: 18.4 % — AB (ref 11.2–14.5)
WBC: 4.3 10*3/uL (ref 3.9–10.3)
lymph#: 0.4 10*3/uL — ABNORMAL LOW (ref 0.9–3.3)

## 2013-09-26 LAB — POCT INR: INR: 2.15

## 2013-09-26 LAB — PROTHROMBIN TIME
INR: 2.15 — ABNORMAL HIGH (ref <1.50)
PROTHROMBIN TIME: 24 s — AB (ref 11.6–15.2)

## 2013-09-26 MED ORDER — FAMOTIDINE IN NACL 20-0.9 MG/50ML-% IV SOLN
20.0000 mg | Freq: Once | INTRAVENOUS | Status: AC
  Administered 2013-09-26: 20 mg via INTRAVENOUS

## 2013-09-26 MED ORDER — DEXAMETHASONE SODIUM PHOSPHATE 20 MG/5ML IJ SOLN
20.0000 mg | Freq: Once | INTRAMUSCULAR | Status: AC
  Administered 2013-09-26: 20 mg via INTRAVENOUS

## 2013-09-26 MED ORDER — DIPHENHYDRAMINE HCL 50 MG/ML IJ SOLN
INTRAMUSCULAR | Status: AC
  Filled 2013-09-26: qty 1

## 2013-09-26 MED ORDER — ONDANSETRON 16 MG/50ML IVPB (CHCC)
16.0000 mg | Freq: Once | INTRAVENOUS | Status: AC
  Administered 2013-09-26: 16 mg via INTRAVENOUS

## 2013-09-26 MED ORDER — DIPHENHYDRAMINE HCL 50 MG/ML IJ SOLN
50.0000 mg | Freq: Once | INTRAMUSCULAR | Status: AC
  Administered 2013-09-26: 50 mg via INTRAVENOUS

## 2013-09-26 MED ORDER — SODIUM CHLORIDE 0.9 % IV SOLN
330.0000 mg | Freq: Once | INTRAVENOUS | Status: AC
  Administered 2013-09-26: 330 mg via INTRAVENOUS
  Filled 2013-09-26: qty 33

## 2013-09-26 MED ORDER — HEPARIN SOD (PORK) LOCK FLUSH 100 UNIT/ML IV SOLN
500.0000 [IU] | Freq: Once | INTRAVENOUS | Status: AC | PRN
  Administered 2013-09-26: 500 [IU]
  Filled 2013-09-26: qty 5

## 2013-09-26 MED ORDER — SODIUM CHLORIDE 0.9 % IJ SOLN
10.0000 mL | INTRAMUSCULAR | Status: DC | PRN
Start: 2013-09-26 — End: 2013-09-26
  Administered 2013-09-26: 10 mL via INTRAVENOUS
  Filled 2013-09-26: qty 10

## 2013-09-26 MED ORDER — SODIUM CHLORIDE 0.9 % IV SOLN
Freq: Once | INTRAVENOUS | Status: AC
  Administered 2013-09-26: 14:00:00 via INTRAVENOUS

## 2013-09-26 MED ORDER — PACLITAXEL CHEMO INJECTION 300 MG/50ML
175.0000 mg/m2 | Freq: Once | INTRAVENOUS | Status: AC
  Administered 2013-09-26: 360 mg via INTRAVENOUS
  Filled 2013-09-26: qty 60

## 2013-09-26 MED ORDER — DEXAMETHASONE SODIUM PHOSPHATE 20 MG/5ML IJ SOLN
INTRAMUSCULAR | Status: AC
  Filled 2013-09-26: qty 5

## 2013-09-26 MED ORDER — SODIUM CHLORIDE 0.9 % IJ SOLN
10.0000 mL | INTRAMUSCULAR | Status: DC | PRN
  Administered 2013-09-26: 10 mL
  Filled 2013-09-26: qty 10

## 2013-09-26 MED ORDER — ONDANSETRON 16 MG/50ML IVPB (CHCC)
INTRAVENOUS | Status: AC
  Filled 2013-09-26: qty 16

## 2013-09-26 MED ORDER — FAMOTIDINE IN NACL 20-0.9 MG/50ML-% IV SOLN
INTRAVENOUS | Status: AC
  Filled 2013-09-26: qty 50

## 2013-09-26 NOTE — Patient Instructions (Signed)
[facility] Discharge Instructions for Patients Receiving Chemotherapy  Today you received the following chemotherapy agents Taxol/Carboplatin To help prevent nausea and vomiting after your treatment, we encourage you to take your nausea medication as prescribed.  If you develop nausea and vomiting that is not controlled by your nausea medication, call the clinic.   BELOW ARE SYMPTOMS THAT SHOULD BE REPORTED IMMEDIATELY:  *FEVER GREATER THAN 100.5 F  *CHILLS WITH OR WITHOUT FEVER  NAUSEA AND VOMITING THAT IS NOT CONTROLLED WITH YOUR NAUSEA MEDICATION  *UNUSUAL SHORTNESS OF BREATH  *UNUSUAL BRUISING OR BLEEDING  TENDERNESS IN MOUTH AND THROAT WITH OR WITHOUT PRESENCE OF ULCERS  *URINARY PROBLEMS  *BOWEL PROBLEMS  UNUSUAL RASH Items with * indicate a potential emergency and should be followed up as soon as possible.  Feel free to call the clinic you have any questions or concerns. The clinic phone number is [phone].    

## 2013-09-26 NOTE — Progress Notes (Signed)
INR = 2.15 on Coumadin 10 mg daily. No complaints today re: anticoag. Meds have remained the same since last visit. She is getting chemo today. She had a great 48th birthday- had a party with 33 guests. INR at goal.  Continue same daily dose. Repeat protime on 7/23 when she will see Dr. Skeet Latch same day. [name], Pharm.D., CPP 09/26/2013@2 :09 PM

## 2013-09-26 NOTE — Patient Instructions (Signed)

## 2013-10-01 ENCOUNTER — Other Ambulatory Visit: Payer: Self-pay | Admitting: Oncology

## 2013-10-02 ENCOUNTER — Ambulatory Visit (HOSPITAL_BASED_OUTPATIENT_CLINIC_OR_DEPARTMENT_OTHER): Payer: No Typology Code available for payment source | Admitting: Oncology

## 2013-10-02 ENCOUNTER — Other Ambulatory Visit (HOSPITAL_BASED_OUTPATIENT_CLINIC_OR_DEPARTMENT_OTHER): Payer: No Typology Code available for payment source

## 2013-10-02 ENCOUNTER — Encounter: Payer: Self-pay | Admitting: Oncology

## 2013-10-02 ENCOUNTER — Ambulatory Visit (HOSPITAL_BASED_OUTPATIENT_CLINIC_OR_DEPARTMENT_OTHER): Payer: Self-pay | Admitting: Pharmacist

## 2013-10-02 ENCOUNTER — Ambulatory Visit (HOSPITAL_BASED_OUTPATIENT_CLINIC_OR_DEPARTMENT_OTHER): Payer: No Typology Code available for payment source

## 2013-10-02 ENCOUNTER — Telehealth: Payer: Self-pay | Admitting: Oncology

## 2013-10-02 ENCOUNTER — Ambulatory Visit: Payer: No Typology Code available for payment source

## 2013-10-02 VITALS — BP 123/79 | HR 96 | Temp 98.1°F | Resp 18 | Ht 67.0 in | Wt 205.6 lb

## 2013-10-02 DIAGNOSIS — I82401 Acute embolism and thrombosis of unspecified deep veins of right lower extremity: Secondary | ICD-10-CM

## 2013-10-02 DIAGNOSIS — D649 Anemia, unspecified: Secondary | ICD-10-CM

## 2013-10-02 DIAGNOSIS — G8929 Other chronic pain: Secondary | ICD-10-CM

## 2013-10-02 DIAGNOSIS — C569 Malignant neoplasm of unspecified ovary: Secondary | ICD-10-CM

## 2013-10-02 DIAGNOSIS — D702 Other drug-induced agranulocytosis: Secondary | ICD-10-CM

## 2013-10-02 DIAGNOSIS — I82409 Acute embolism and thrombosis of unspecified deep veins of unspecified lower extremity: Secondary | ICD-10-CM

## 2013-10-02 DIAGNOSIS — C50919 Malignant neoplasm of unspecified site of unspecified female breast: Secondary | ICD-10-CM

## 2013-10-02 DIAGNOSIS — M549 Dorsalgia, unspecified: Secondary | ICD-10-CM

## 2013-10-02 DIAGNOSIS — C541 Malignant neoplasm of endometrium: Secondary | ICD-10-CM

## 2013-10-02 DIAGNOSIS — Z809 Family history of malignant neoplasm, unspecified: Secondary | ICD-10-CM

## 2013-10-02 DIAGNOSIS — C55 Malignant neoplasm of uterus, part unspecified: Secondary | ICD-10-CM

## 2013-10-02 DIAGNOSIS — Z803 Family history of malignant neoplasm of breast: Secondary | ICD-10-CM

## 2013-10-02 DIAGNOSIS — Z5189 Encounter for other specified aftercare: Secondary | ICD-10-CM

## 2013-10-02 DIAGNOSIS — C562 Malignant neoplasm of left ovary: Secondary | ICD-10-CM

## 2013-10-02 DIAGNOSIS — C779 Secondary and unspecified malignant neoplasm of lymph node, unspecified: Secondary | ICD-10-CM

## 2013-10-02 DIAGNOSIS — C787 Secondary malignant neoplasm of liver and intrahepatic bile duct: Secondary | ICD-10-CM

## 2013-10-02 DIAGNOSIS — N289 Disorder of kidney and ureter, unspecified: Secondary | ICD-10-CM

## 2013-10-02 DIAGNOSIS — I824Y9 Acute embolism and thrombosis of unspecified deep veins of unspecified proximal lower extremity: Secondary | ICD-10-CM

## 2013-10-02 LAB — BASIC METABOLIC PANEL (CC13)
Anion Gap: 8 mEq/L (ref 3–11)
BUN: 32.9 mg/dL — ABNORMAL HIGH (ref 7.0–26.0)
CO2: 25 meq/L (ref 22–29)
Calcium: 9.5 mg/dL (ref 8.4–10.4)
Chloride: 104 mEq/L (ref 98–109)
Creatinine: 1.3 mg/dL — ABNORMAL HIGH (ref 0.6–1.1)
Glucose: 81 mg/dl (ref 70–140)
POTASSIUM: 4.8 meq/L (ref 3.5–5.1)
SODIUM: 137 meq/L (ref 136–145)

## 2013-10-02 LAB — CBC WITH DIFFERENTIAL/PLATELET
BASO%: 0.6 % (ref 0.0–2.0)
BASOS ABS: 0 10*3/uL (ref 0.0–0.1)
EOS%: 0.2 % (ref 0.0–7.0)
Eosinophils Absolute: 0 10*3/uL (ref 0.0–0.5)
HCT: 26.6 % — ABNORMAL LOW (ref 34.8–46.6)
HEMOGLOBIN: 8.8 g/dL — AB (ref 11.6–15.9)
LYMPH%: 22.3 % (ref 14.0–49.7)
MCH: 32.5 pg (ref 25.1–34.0)
MCHC: 33.1 g/dL (ref 31.5–36.0)
MCV: 98.2 fL (ref 79.5–101.0)
MONO#: 0.1 10*3/uL (ref 0.1–0.9)
MONO%: 3.2 % (ref 0.0–14.0)
NEUT%: 73.7 % (ref 38.4–76.8)
NEUTROS ABS: 1.7 10*3/uL (ref 1.5–6.5)
PLATELETS: 228 10*3/uL (ref 145–400)
RBC: 2.71 10*6/uL — ABNORMAL LOW (ref 3.70–5.45)
RDW: 17.5 % — ABNORMAL HIGH (ref 11.2–14.5)
WBC: 2.4 10*3/uL — AB (ref 3.9–10.3)
lymph#: 0.5 10*3/uL — ABNORMAL LOW (ref 0.9–3.3)

## 2013-10-02 LAB — PROTIME-INR
INR: 2.8 (ref 2.00–3.50)
PROTIME: 33.6 s — AB (ref 10.6–13.4)

## 2013-10-02 LAB — POCT INR: INR: 2.8

## 2013-10-02 MED ORDER — PEGFILGRASTIM INJECTION 6 MG/0.6ML
6.0000 mg | Freq: Once | SUBCUTANEOUS | Status: AC
  Administered 2013-10-02: 6 mg via SUBCUTANEOUS
  Filled 2013-10-02: qty 0.6

## 2013-10-02 NOTE — Telephone Encounter (Signed)
gv pt appt schedule for july thru sept. per LL appts for lb/flush/LL on 8/12 cxd.

## 2013-10-02 NOTE — Progress Notes (Signed)
INR at goal Pt is doing well with no complaints Pt here today to see Dr. Marko Plume as well as receive neulasta injection Pt reports no missed or extra doses No unusual bleeding or bruising No diet or medication changes Plan: No changes Continue Coumadin 10 mg daily.   Recheck her INR with visit on 10/12/13.  Lab at 9 am, coumadin clinic at 9:15 am & Dr. Skeet Latch at 10 am

## 2013-10-02 NOTE — Patient Instructions (Signed)
INR at goal No changes Continue Coumadin 10 mg daily.   Recheck her INR with visit on 10/12/13.  Lab at 9 am, coumadin clinic at 9:15 am & Dr. Skeet Latch at 10 am
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
## 2013-10-03 ENCOUNTER — Telehealth: Payer: Self-pay | Admitting: *Deleted

## 2013-10-03 LAB — CA 125: CA 125: 3.8 U/mL (ref 0.0–30.2)

## 2013-10-03 NOTE — Telephone Encounter (Signed)
Left message to call office

## 2013-10-03 NOTE — Telephone Encounter (Signed)
Gave patient results of ca 125.

## 2013-10-03 NOTE — Telephone Encounter (Signed)
Message copied by Sharlynn Oliphant A on Tue Oct 03, 2013 12:30 PM ------      Message from: Gordy Levan      Created: Tue Oct 03, 2013 12:15 PM       Labs seen and need follow up: please let her know marker is in good low range at 3.8 ------

## 2013-10-04 ENCOUNTER — Other Ambulatory Visit: Payer: No Typology Code available for payment source

## 2013-10-04 ENCOUNTER — Ambulatory Visit: Payer: No Typology Code available for payment source | Admitting: Oncology

## 2013-10-06 ENCOUNTER — Encounter: Payer: Self-pay | Admitting: Genetic Counselor

## 2013-10-06 NOTE — Progress Notes (Signed)
Referring Physician: Zane Herald, MD   Caitlyn Higgins was called today to discuss genetic test results. Please see the Genetics note from her visit on 09/14/13 for a detailed discussion of her personal and family history.  GENETIC TESTING: At the time of Caitlyn Higgins's visit, we recommended she pursue genetic testing of multiple genes on the OvaNext gene panel given her personal history of both ovarian and uterine cancers. This test, which included sequencing and deletion/duplication analysis of 24 genes, was performed at [facility]. Testing was normal and did not reveal a mutation in these genes. The genes tested were ATM, BARD1, BRCA1, BRCA2, BRIP1, CDH1, CHEK2, EPCAM, MLH1, MRE11A, MSH2, MSH6, MUTYH, NBN, NF1, PALB2, PMS2, PTEN, RAD50, RAD51C, RAD51D, SMARCA4, STK11, and TP53.  We discussed with Caitlyn Higgins that since the current test is not perfect, it is possible there may be a gene mutation that current testing cannot detect, but that chance is small. We also discussed that it is possible that a different genetic factor, which was not part of this testing or has not yet been discovered, is responsible for the cancer diagnoses in the family. Again, this is not of high likelihood given her family history.  CANCER SCREENING: This result suggests that Caitlyn Higgins's cancers were most likely not due to an inherited predisposition. Most cancers happen by chance and this negative test, along with details of her family history, suggests that her cancers fall into this category. We, therefore, recommended she continue to follow the cancer screening guidelines provided by her physician.   FAMILY MEMBERS: Given the family history, Caitlyn Higgins's relatives may be at increased risk for cancer over the general population and should discuss this history with their own providers. Caitlyn Higgins stated that both of her full sisters have had a hysterectomy for benign reasons.  Lastly, we discussed with Caitlyn Higgins  that cancer genetics is a rapidly advancing field and it is possible that new genetic tests will be appropriate for her in the future. We encouraged her to remain in contact with [place] on an annual basis so we can update her personal and family histories, and let her know of advances in cancer genetics that may benefit the family. Our contact number was provided. Caitlyn Higgins's questions were answered to her satisfaction today, and she knows she is welcome to call anytime with additional questions.    Steele Berg, MS, [place] Certified Genetic Counseor phone: [phone] [name]

## 2013-10-10 ENCOUNTER — Ambulatory Visit (HOSPITAL_COMMUNITY)
Admission: RE | Admit: 2013-10-10 | Discharge: 2013-10-10 | Disposition: A | Discharge status: Home / Self Care | Payer: No Typology Code available for payment source | Source: Ambulatory Visit | Attending: Oncology | Admitting: Oncology

## 2013-10-10 ENCOUNTER — Encounter (HOSPITAL_COMMUNITY): Payer: Self-pay

## 2013-10-10 ENCOUNTER — Telehealth: Payer: Self-pay | Admitting: Gynecologic Oncology

## 2013-10-10 DIAGNOSIS — I700 Atherosclerosis of aorta: Secondary | ICD-10-CM | POA: Insufficient documentation

## 2013-10-10 DIAGNOSIS — E041 Nontoxic single thyroid nodule: Secondary | ICD-10-CM | POA: Insufficient documentation

## 2013-10-10 DIAGNOSIS — J984 Other disorders of lung: Secondary | ICD-10-CM | POA: Insufficient documentation

## 2013-10-10 DIAGNOSIS — I2510 Atherosclerotic heart disease of native coronary artery without angina pectoris: Secondary | ICD-10-CM | POA: Insufficient documentation

## 2013-10-10 DIAGNOSIS — C541 Malignant neoplasm of endometrium: Secondary | ICD-10-CM

## 2013-10-10 DIAGNOSIS — Z90710 Acquired absence of both cervix and uterus: Secondary | ICD-10-CM | POA: Insufficient documentation

## 2013-10-10 DIAGNOSIS — C569 Malignant neoplasm of unspecified ovary: Secondary | ICD-10-CM | POA: Insufficient documentation

## 2013-10-10 DIAGNOSIS — Z9221 Personal history of antineoplastic chemotherapy: Secondary | ICD-10-CM | POA: Insufficient documentation

## 2013-10-10 MED ORDER — IOHEXOL 300 MG/ML  SOLN
100.0000 mL | Freq: Once | INTRAMUSCULAR | Status: AC | PRN
  Administered 2013-10-10: 100 mL via INTRAVENOUS

## 2013-10-10 NOTE — Telephone Encounter (Addendum)
Office Visit:  GYN ONCOLOGY  Chief Complaint:  Uterine carcinosarcoma, ovarian clear cell cancer  Assessment/Plan  [name] is a 48 y.o. 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  Physical examination was notable for cervical involvement.   She was S/P IVC filter. [name] received 4 cycles of chemtherapy with resolution of the cervical lesion and no further bleeding.  Final pathology c/w two primary malignancies.  Predominantly clear cell cancer arising in the left ovarian endometriotic cyst and uterine carcinosarcoma.  Caitlyn Higgins has received an additional  Two cycles taxol/carboplain therapy.  Cycle 6 09/26/2013  CT scan 10/10/2013 c/w persistent peritoneal disease, PALN enlargement stable axillary adenopathy and thyroid nodule.  Patient with stretched financial resources.  Will discuss axillary LN bx with Dr. Marko Plume Three additional cycles taxane/platin.   History of Present Illness  Caitlyn Higgins is a 48 y.o. who presented with a  pelvic mass, to [facility] and was transferred to [facility] 02/23/2013.   The patient feels that she was in her usual state of health until a few days prior to presentation when she noted abdominal distention and bloating. She presented to her gastroenterologist and was sent for a CT scan.  The CT 02/22/2013 demonstrated " 1. 24.1 x 16.8 x 19.9 cm complex cystic mass which appears to arise from the pelvis extending into the mid upper abdomen is highly suspicious for an ovarian neoplasm. There is evidence of early omental disease, as well as a serosal implant upon the surface of the liver, with small volume of probable malignant ascites, and potential transdiaphragmatic extension based on small lymph nodes immediately above the diaphragm.  2.  Importantly, this pelvic mass is causing severe compression of the proximal inferior vena cava, and there is a large volume of deep venous thrombosis in the visualized portion of the right thigh involving right superficial femoral, profunda femoral and common femoral veins. Additionally, there is a mass like peripheral wedge-shaped opacity in the right lower lobe. In the setting of deep venous thrombosis, this wedge-shaped opacity would be most concerning for potential pulmonary infarction. Associated with this, there is a trace right pleural effusion. 3. 5.2 x 1.8 cm heterogeneously enhancing lesion  associated with the liver capsule overlying the right lobe of the  liver "  She was transferred to [facility].    IVC filter was placed and she was started on Lovenox.  She was subsequently transitioned to warfarin.   In December 2014 an endometrial biopsy was collected. Pathology :  1. Cervix, biopsy - HIGH GRADE ENDOMETRIAL CARCINOMA, SEE COMMENT. 2. Endometrium, biopsy - HIGH GRADE ENDOMETRIAL CARCINOMA, SEE COMMENT. 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  [name]  received the 4th cycle of Taxol carboplatin therapy on 05/24/2013. She underwent interval debulking TAH BSO omentectomy 08/01/2013  Specimen(s): Left ovary and fallopian tube.  Left salpingo-oophorectomy Primary tumor site (including laterality): Left ovary Ovarian surface involvement: Present. Ovarian capsule intact without fragmentation: No. Maximum tumor size (cm): 20 cm, gross measurement. Histologic type: Mixed surface epithelial carcinoma with predominant clear cell carcinoma component (70%) and minor endometrioid component (30%) Please see comment for detail. Grade: High grade  TNM code: ypT1c2, pNX  FIGO Stage (based on pathologic findings, needs  clinical correlation): at least IC2 Comments: Received grossly is a 20 cm focally disrupted and partially collapsed multicystic ovary with attached/adherent omentum. Microscopically, sectioned show an invasive high grade surface carcinoma arising in a background of endometriotic cyst. The tumor is predominately composed of clear cell carcinoma component with minor endometrioid carcinoma component. No tumor involvement is identified in the adherent omentum tissue. Immunohistochemical stains were performed and the clear cell carcinoma is patchy positive for p16, ER, p53, negative for WT-1 and p63. Focal angiolymphatic invasion is present.   The morphologic features are different from those seen in the uterus.  2. ONCOLOGY TABLE-UTERUS, CARCINOSARCOMA Specimen: Uterus, cervix and right fallopian tubes and ovaries. Procedure: Total hysterectomy and right salpingo-oophorectomy. Lymph node sampling performed: No. Specimen integrity: Intact. Maximum tumor size: Multiple microscopic foci, measuring up to 0.5 cm in greatest dimension. Histologic type: Carcinosarcoma (MMMT) with heterologous component (ossification) Grade: High grade Myometrial invasion: 2.1 cm where myometrium is 2.5 cm in thickness Cervical stromal involvement: No. Extent of involvement of other organs: No. Lymph - vascular invasion: Not identified.  FIGO Stage (based on pathologic findings, needs clinical correlation): IB Comment: Immunohistochemical stains were performed and the residual carcinoma is positive for p16, negative for WT-1, weakly positive for p53 and ER. The morphologic features are different from those seen in the left ovary. (HCL:gt, 08/04/13)  Completed cycle 6 of chemotherapy 09/26/2048.  CT chest abdomen and pelvis 10/10/2013 IMPRESSION:  1. Similar appearance of prominent bilateral axillary lymph nodes. The largest is on the left measuring up to 1.3 cm.  2. Decrease in size of prominent pre-vascular,  anterior mediastinum lymph node identified on previous exam.  3. Large nodule in left lobe of thyroid gland is again noted. Consider further evaluation with thyroid ultrasound. If patient is clinically hyperthyroid, consider nuclear medicine thyroid uptake and scan.  4. Postsurgical and post therapeutic changes within the abdomen and pelvis with overall decrease in tumor volume. There has been interval resolution of abdominal and pelvic ascites as well as tumor implant along the surface of the liver. A few small peritoneal nodules remain within the ventral abdomen.  5. There is a periaortic lymph node which is slightly increased in size from previous exam. Similarly, there are borderline, bilateral external iliac lymph nodes which are mildly increased in the interval.   Past Medical History  Diagnosis Date  . Reflux   . Hypertension   . Cancer     uterine carcinosarcoma  . DVT (deep venous thrombosis)     right leg 12'14  . Pulmonary emboli     12'14 denies any sob  . Elevated blood uric acid level     tx. Allopurinol  . GERD (gastroesophageal reflux disease)   . Transfusion history 07-26-13    12'14, 04-14-13(2 units), 06-09-13  . Anemia   . Family history of malignant neoplasm of breast   . Ovarian cancer 2015   Past Surgical History  Procedure Laterality Date  . No past surgeries    . Portacath placement Right     rigth chest- remains  . Ivc Right     filter placed to prevent Pulmonary emboli  . Abdominal hysterectomy N/A 08/01/2013    Procedure: EXPLORATORY LAPAROTOMY/HYSTERECTOMY TOTAL ABDOMINAL;  Surgeon: Janie Morning, MD;  Location: [facility];  Service: Gynecology;  Laterality: N/A;  . Salpingoophorectomy Bilateral 08/01/2013    Procedure: BILATERAL SALPINGO OOPHORECTOMY/OMENTECTOMY ;  Surgeon: Janie Morning, MD;  Location: [facility];  Service:  Gynecology;  Laterality: Bilateral;     GYN History G0 Hx of Abnormal Pap Smears: no, last pap ~2010  Hx of STIs: no  Never had  regular periods.    Family History  family history is not on file.   Review of Systems   There's been 30 pound weight loss since the visit in December 2014.  Denies cough, reports improvement mild shortness of breath with exertion, no hemoptysis, reports intermittent nausea she reports constipation. Reports fatigue. States that there's been no vaginal bleeding for almost two months Physical Exam  BP 115/69  Pulse 95  Temp(Src) 97.9 F (36.6 C) (Oral)  Resp 16  Ht 5' 7.91" (1.725 m)  Wt 205 lb 11.2 oz (93.305 kg)  BMI 31.36 kg/m2 Wt Readings from Last 3 Encounters:  10/02/13 205 lb 9.6 oz (93.26 kg)  09/08/13 200 lb 9.6 oz (90.992 kg)  08/16/13 196 lb 9.6 oz (89.177 kg)   General: No acute distress.  HEENT: Extra occular muscles grossly intact.  Pulmonary: CTAB. Normal work of breathing.  Cardiovascular: RRR.  Abdomen: Firm, moderately distended, non-tender. Pelvic and abdominal mass is palpable extending above the umbilicus.  No rebound/guarding.  Musculoskeletal: Grossly normal ROM.  Extremities: Warm and well-perfused. 1+ pitting edema  Neurological: Alert and oriented x3.  Psychological: Appropriate mood/affect.  Genitourinary:

## 2013-10-12 ENCOUNTER — Other Ambulatory Visit: Payer: Self-pay | Admitting: *Deleted

## 2013-10-12 ENCOUNTER — Telehealth: Payer: Self-pay | Admitting: Oncology

## 2013-10-12 ENCOUNTER — Other Ambulatory Visit (HOSPITAL_BASED_OUTPATIENT_CLINIC_OR_DEPARTMENT_OTHER): Payer: No Typology Code available for payment source

## 2013-10-12 ENCOUNTER — Other Ambulatory Visit: Payer: Self-pay | Admitting: Oncology

## 2013-10-12 ENCOUNTER — Ambulatory Visit: Payer: No Typology Code available for payment source | Attending: Gynecologic Oncology | Admitting: Gynecologic Oncology

## 2013-10-12 ENCOUNTER — Ambulatory Visit (HOSPITAL_BASED_OUTPATIENT_CLINIC_OR_DEPARTMENT_OTHER): Payer: Self-pay | Admitting: Pharmacist

## 2013-10-12 ENCOUNTER — Encounter: Payer: Self-pay | Admitting: Gynecologic Oncology

## 2013-10-12 VITALS — BP 133/81 | HR 78 | Temp 98.3°F | Resp 18 | Ht 67.0 in | Wt 210.2 lb

## 2013-10-12 DIAGNOSIS — K219 Gastro-esophageal reflux disease without esophagitis: Secondary | ICD-10-CM | POA: Insufficient documentation

## 2013-10-12 DIAGNOSIS — C55 Malignant neoplasm of uterus, part unspecified: Secondary | ICD-10-CM | POA: Insufficient documentation

## 2013-10-12 DIAGNOSIS — C778 Secondary and unspecified malignant neoplasm of lymph nodes of multiple regions: Secondary | ICD-10-CM | POA: Insufficient documentation

## 2013-10-12 DIAGNOSIS — C562 Malignant neoplasm of left ovary: Secondary | ICD-10-CM

## 2013-10-12 DIAGNOSIS — I824Y9 Acute embolism and thrombosis of unspecified deep veins of unspecified proximal lower extremity: Secondary | ICD-10-CM

## 2013-10-12 DIAGNOSIS — I82409 Acute embolism and thrombosis of unspecified deep veins of unspecified lower extremity: Secondary | ICD-10-CM

## 2013-10-12 DIAGNOSIS — C787 Secondary malignant neoplasm of liver and intrahepatic bile duct: Secondary | ICD-10-CM | POA: Insufficient documentation

## 2013-10-12 DIAGNOSIS — C7960 Secondary malignant neoplasm of unspecified ovary: Secondary | ICD-10-CM | POA: Insufficient documentation

## 2013-10-12 DIAGNOSIS — Z90710 Acquired absence of both cervix and uterus: Secondary | ICD-10-CM | POA: Insufficient documentation

## 2013-10-12 DIAGNOSIS — Z86718 Personal history of other venous thrombosis and embolism: Secondary | ICD-10-CM | POA: Insufficient documentation

## 2013-10-12 DIAGNOSIS — I10 Essential (primary) hypertension: Secondary | ICD-10-CM | POA: Insufficient documentation

## 2013-10-12 DIAGNOSIS — C541 Malignant neoplasm of endometrium: Secondary | ICD-10-CM

## 2013-10-12 DIAGNOSIS — Z7901 Long term (current) use of anticoagulants: Secondary | ICD-10-CM

## 2013-10-12 DIAGNOSIS — I82401 Acute embolism and thrombosis of unspecified deep veins of right lower extremity: Secondary | ICD-10-CM

## 2013-10-12 DIAGNOSIS — Z86711 Personal history of pulmonary embolism: Secondary | ICD-10-CM | POA: Insufficient documentation

## 2013-10-12 LAB — PROTIME-INR
INR: 2.7 (ref 2.00–3.50)
PROTIME: 32.4 s — AB (ref 10.6–13.4)

## 2013-10-12 LAB — POCT INR: INR: 2.7

## 2013-10-12 MED ORDER — OXYCODONE HCL 10 MG PO TABS: ORAL_TABLET | ORAL | Status: DC

## 2013-10-12 NOTE — Progress Notes (Signed)
Office Visit:  GYN ONCOLOGY  Chief Complaint:  Uterine carcinosarcoma, ovarian clear cell cancer  Assessment/Plan  [name] is a 48 y.o. 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  Physical examination was notable for cervical involvement.   She was S/P IVC filter. [name] received 4 cycles of chemtherapy with resolution of the cervical lesion and no further bleeding.  Final pathology c/w two primary malignancies.  Predominantly clear cell cancer arising in the left ovarian endometriotic cyst and uterine carcinosarcoma.  Caitlyn Higgins has received an additional  Two cycles taxol/carboplain therapy.  Cycle 6 09/26/2013  CT scan 10/10/2013 c/w persistent peritoneal disease, PALN enlargement stable axillary adenopathy and thyroid nodule. Referral to endocrinology for assessment of the thyroid nodule Mammogram scheduled  Three additional cycles taxane/platin.  Follow-up in 3 months with GynOnc   History of Present Illness  Caitlyn Higgins is a 48 y.o. who presented with a  pelvic mass, to [facility] and was transferred to [facility] 02/23/2013.   The patient feels that she was in her usual state of health until a few days prior to presentation when she noted abdominal distention and bloating. She presented to her gastroenterologist and was sent for a CT scan.  The CT 02/22/2013 demonstrated " 1. 24.1 x 16.8 x 19.9 cm complex cystic mass which appears to arise from the pelvis extending into the mid upper abdomen is highly suspicious for an ovarian neoplasm. There is evidence of early omental disease, as well as a serosal implant upon the surface of the liver, with small volume of probable malignant ascites, and potential transdiaphragmatic extension based on small lymph nodes immediately  above the diaphragm.  2. Importantly, this pelvic mass is causing severe compression of the proximal inferior vena cava, and there is a large volume of deep venous thrombosis in the visualized portion of the right thigh involving right superficial femoral, profunda femoral and common femoral veins. Additionally, there is a mass like peripheral wedge-shaped opacity in the right lower lobe. In the setting of deep venous thrombosis, this wedge-shaped opacity would be most concerning for potential pulmonary infarction. Associated with this, there is a trace right pleural effusion. 3. 5.2 x 1.8 cm heterogeneously enhancing lesion  associated with the liver capsule overlying the right lobe of the  liver "  She was transferred to [facility].    IVC filter was placed and she was started on Lovenox.  She was subsequently transitioned to warfarin.   In December 2014 an endometrial biopsy was collected. Pathology :  1. Cervix, biopsy - HIGH GRADE ENDOMETRIAL CARCINOMA, SEE COMMENT. 2. Endometrium, biopsy - HIGH GRADE ENDOMETRIAL CARCINOMA, SEE COMMENT. 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  [name]  received the 4th cycle of Taxol carboplatin therapy on 05/24/2013. She underwent interval debulking TAH BSO omentectomy 08/01/2013  Specimen(s): Left ovary and fallopian tube.  Left salpingo-oophorectomy Primary tumor site (including laterality): Left ovary Ovarian surface involvement: Present. Ovarian capsule intact without fragmentation: No. Maximum tumor size (cm): 20 cm, gross measurement. Histologic type: Mixed surface epithelial carcinoma with predominant clear cell carcinoma component (70%) and minor endometrioid component (30%) Please see comment for detail. Grade: High grade  TNM code: ypT1c2, pNX FIGO Stage (based on  pathologic findings, needs clinical correlation): at least IC2 Comments: Received grossly is a 20 cm focally disrupted and partially collapsed multicystic ovary with attached/adherent omentum. Microscopically, sectioned show an invasive high grade surface carcinoma arising in a background of endometriotic cyst. The tumor is predominately composed of clear cell carcinoma component with minor endometrioid carcinoma component. No tumor involvement is identified in the adherent omentum tissue. Immunohistochemical stains were performed and the clear cell carcinoma is patchy positive for p16, ER, p53, negative for WT-1 and p63. Focal angiolymphatic invasion is present.   The morphologic features are different from those seen in the uterus.  2. ONCOLOGY TABLE-UTERUS, CARCINOSARCOMA Specimen: Uterus, cervix and right fallopian tubes and ovaries. Procedure: Total hysterectomy and right salpingo-oophorectomy. Lymph node sampling performed: No. Specimen integrity: Intact. Maximum tumor size: Multiple microscopic foci, measuring up to 0.5 cm in greatest dimension. Histologic type: Carcinosarcoma (MMMT) with heterologous component (ossification) Grade: High grade Myometrial invasion: 2.1 cm where myometrium is 2.5 cm in thickness Cervical stromal involvement: No. Extent of involvement of other organs: No. Lymph - vascular invasion: Not identified.  FIGO Stage (based on pathologic findings, needs clinical correlation): IB Comment: Immunohistochemical stains were performed and the residual carcinoma is positive for p16, negative for WT-1, weakly positive for p53 and ER. The morphologic features are different from those seen in the left ovary. (HCL:gt, 08/04/13)  Completed cycle 6 of chemotherapy 09/26/2012.  CT chest abdomen and pelvis 10/10/2013 IMPRESSION:  1. Similar appearance of prominent bilateral axillary lymph nodes. The largest is on the left measuring up to 1.3 cm.  2. Decrease in size of prominent  pre-vascular, anterior mediastinum lymph node identified on previous exam.  3. Large nodule in left lobe of thyroid gland is again noted. Consider further evaluation with thyroid ultrasound. If patient is clinically hyperthyroid, consider nuclear medicine thyroid uptake and scan.  4. Postsurgical and post therapeutic changes within the abdomen and pelvis with overall decrease in tumor volume. There has been interval resolution of abdominal and pelvic ascites as well as tumor implant along the surface of the liver. A few small peritoneal nodules remain within the ventral abdomen.  5. There is a periaortic lymph node which is slightly increased in size from previous exam. Similarly, there are borderline, bilateral external iliac lymph nodes which are mildly increased in the interval.   Past Medical History  Diagnosis Date  . Reflux   . Hypertension   . Cancer     uterine carcinosarcoma  . DVT (deep venous thrombosis)     right leg 12'14  . Pulmonary emboli     12'14 denies any sob  . Elevated blood uric acid level     tx. Allopurinol  . GERD (gastroesophageal reflux disease)   . Transfusion history 07-26-13    12'14, 04-14-13(2 units), 06-09-13  . Anemia   . Family history of malignant neoplasm of breast   . Ovarian cancer 2015   Past Surgical History  Procedure Laterality Date  . No past surgeries    . Portacath placement Right     rigth chest- remains  . Ivc Right     filter placed to prevent Pulmonary emboli  . Abdominal hysterectomy N/A 08/01/2013    Procedure: EXPLORATORY LAPAROTOMY/HYSTERECTOMY TOTAL ABDOMINAL;  Surgeon: Janie Morning, MD;  Location: [facility];  Service: Gynecology;  Laterality: N/A;  . Salpingoophorectomy Bilateral 08/01/2013    Procedure: BILATERAL SALPINGO OOPHORECTOMY/OMENTECTOMY ;  Surgeon: Janie Morning, MD;  Location:  [facility];  Service: Gynecology;  Laterality: Bilateral;     GYN History G0 Hx of Abnormal Pap Smears: no, last pap ~2010  Hx of STIs: no   Never had regular periods.    Family History  family history is not on file.   Review of Systems  Reports recent weight gain.  Denies cough, no hemoptysis, reports intermittent nausea she reports constipation. No vaginal bleeding, no rectal bleeding. No dysphagia.  Physical Exam  BP 133/81  Pulse 78  Temp(Src) 98.3 F (36.8 C) (Oral)  Resp 18  Ht '5\' 7"'  (1.702 m)  Wt 210 lb 3.2 oz (95.346 kg)  BMI 32.91 kg/m2  LMP 03/09/2013 Wt Readings from Last 3 Encounters:  10/12/13 210 lb 3.2 oz (95.346 kg)  10/02/13 205 lb 9.6 oz (93.26 kg)  09/08/13 200 lb 9.6 oz (90.992 kg)   General: No acute distress.  HEENT: Extra occular muscles grossly intact.  Pulmonary: CTAB. Normal work of breathing.  Cardiovascular: RRR.  Abdomen: Soft NT.  Midline incision well healed.  No masses or hernia.  No rebound/guarding.  Musculoskeletal: Grossly normal ROM.  Extremities: Warm and well-perfused. 1+ pitting edema  Neurological: Alert and oriented x3.  Psychological: Appropriate mood/affect.  Genitourinary:  Nl EGBUS, Vagina without bleeding or discharge.  Cuff intact.  No cul de sac masses. Breasts:  Pendulous, no masses, no dimpling no retractions no nipple discharge. LN:  No cervical supraclavicular or inguinal adenopathy.

## 2013-10-12 NOTE — Patient Instructions (Signed)
INR at goal No Changes Continue Coumadin 10 mg daily.   Recheck her INR with visit on 10/31/13.  Lab at 9:30 am, coumadin clinic at 9:45 am

## 2013-10-12 NOTE — Telephone Encounter (Signed)
per Gerald Stabs CC to sch appts-pt aware

## 2013-10-12 NOTE — Progress Notes (Signed)
INR at goal Pt is doing well today with no complaints Pt has follow up visit with Dr. Skeet Latch this morning as well Pt reports no unusual bleeding or bruising No missed or extra doses Pt did run out of her 10 mg tablets yesterday but did have  5 mg tablets and she took 2 of these on 7/22 No diet or medication changes Plan: No Changes Continue Coumadin 10 mg daily.   Recheck her INR with visit on 10/31/13.  Lab at 9:30 am, coumadin clinic at 9:45 am   Coumadin samples:  Coumadin 10 mg tabs X 40 LOT: [id number] Exp: 7/16

## 2013-10-12 NOTE — Patient Instructions (Signed)
Mammogram Referral to endocrinology for evaluation of thyroid nodule  Follow-up in 3 months    Thank you very much [name] for allowing me to provide care for you today.  I appreciate your confidence in choosing our Gynecologic Oncology team.  If you have any questions about your visit today please call our office and we will get back to you as soon as possible.  Please consider using the website Medlineplus.gov as an Geneticist, molecular.   Caitlyn Higgins. Caitlyn Kise MD., PhD Gynecologic Oncology

## 2013-10-13 ENCOUNTER — Telehealth: Payer: Self-pay | Admitting: Oncology

## 2013-10-13 ENCOUNTER — Other Ambulatory Visit: Payer: Self-pay | Admitting: Oncology

## 2013-10-13 NOTE — Telephone Encounter (Signed)
s.w. pt and advised on Aug appt....pt ok and aware °

## 2013-10-24 ENCOUNTER — Other Ambulatory Visit: Payer: Self-pay | Admitting: Oncology

## 2013-10-24 ENCOUNTER — Other Ambulatory Visit: Payer: Self-pay | Admitting: Gynecologic Oncology

## 2013-10-24 ENCOUNTER — Other Ambulatory Visit: Payer: Self-pay | Admitting: *Deleted

## 2013-10-24 DIAGNOSIS — Z1231 Encounter for screening mammogram for malignant neoplasm of breast: Secondary | ICD-10-CM

## 2013-10-24 DIAGNOSIS — E041 Nontoxic single thyroid nodule: Secondary | ICD-10-CM

## 2013-10-30 ENCOUNTER — Telehealth: Payer: Self-pay | Admitting: Oncology

## 2013-10-30 NOTE — Telephone Encounter (Signed)
per Theadora Rama to sch pt CC & lab for 8./11-pt aware

## 2013-10-31 ENCOUNTER — Ambulatory Visit: Payer: No Typology Code available for payment source

## 2013-10-31 ENCOUNTER — Other Ambulatory Visit: Payer: No Typology Code available for payment source

## 2013-11-01 ENCOUNTER — Ambulatory Visit: Payer: No Typology Code available for payment source | Admitting: Oncology

## 2013-11-01 ENCOUNTER — Other Ambulatory Visit: Payer: No Typology Code available for payment source

## 2013-11-02 ENCOUNTER — Ambulatory Visit
Admission: RE | Admit: 2013-11-02 | Discharge: 2013-11-02 | Disposition: A | Discharge status: Home / Self Care | Payer: No Typology Code available for payment source | Source: Ambulatory Visit | Attending: Gynecologic Oncology | Admitting: Gynecologic Oncology

## 2013-11-02 DIAGNOSIS — Z1231 Encounter for screening mammogram for malignant neoplasm of breast: Secondary | ICD-10-CM

## 2013-11-03 ENCOUNTER — Ambulatory Visit (HOSPITAL_BASED_OUTPATIENT_CLINIC_OR_DEPARTMENT_OTHER): Payer: Self-pay | Admitting: Pharmacist

## 2013-11-03 ENCOUNTER — Encounter: Payer: Self-pay | Admitting: Oncology

## 2013-11-03 ENCOUNTER — Telehealth: Payer: Self-pay | Admitting: *Deleted

## 2013-11-03 ENCOUNTER — Other Ambulatory Visit (HOSPITAL_BASED_OUTPATIENT_CLINIC_OR_DEPARTMENT_OTHER): Payer: No Typology Code available for payment source

## 2013-11-03 ENCOUNTER — Ambulatory Visit (HOSPITAL_BASED_OUTPATIENT_CLINIC_OR_DEPARTMENT_OTHER): Payer: No Typology Code available for payment source | Admitting: Oncology

## 2013-11-03 ENCOUNTER — Telehealth: Payer: Self-pay | Admitting: Oncology

## 2013-11-03 ENCOUNTER — Ambulatory Visit: Payer: No Typology Code available for payment source

## 2013-11-03 VITALS — BP 111/53 | HR 73 | Temp 98.1°F | Resp 18 | Ht 67.0 in | Wt 217.7 lb

## 2013-11-03 DIAGNOSIS — I2699 Other pulmonary embolism without acute cor pulmonale: Secondary | ICD-10-CM

## 2013-11-03 DIAGNOSIS — I824Y9 Acute embolism and thrombosis of unspecified deep veins of unspecified proximal lower extremity: Secondary | ICD-10-CM

## 2013-11-03 DIAGNOSIS — C50919 Malignant neoplasm of unspecified site of unspecified female breast: Secondary | ICD-10-CM

## 2013-11-03 DIAGNOSIS — Z95828 Presence of other vascular implants and grafts: Secondary | ICD-10-CM

## 2013-11-03 DIAGNOSIS — C787 Secondary malignant neoplasm of liver and intrahepatic bile duct: Secondary | ICD-10-CM

## 2013-11-03 DIAGNOSIS — G8929 Other chronic pain: Secondary | ICD-10-CM

## 2013-11-03 DIAGNOSIS — Z803 Family history of malignant neoplasm of breast: Secondary | ICD-10-CM

## 2013-11-03 DIAGNOSIS — K219 Gastro-esophageal reflux disease without esophagitis: Secondary | ICD-10-CM

## 2013-11-03 DIAGNOSIS — N289 Disorder of kidney and ureter, unspecified: Secondary | ICD-10-CM

## 2013-11-03 DIAGNOSIS — I10 Essential (primary) hypertension: Secondary | ICD-10-CM

## 2013-11-03 DIAGNOSIS — I82409 Acute embolism and thrombosis of unspecified deep veins of unspecified lower extremity: Secondary | ICD-10-CM

## 2013-11-03 DIAGNOSIS — C779 Secondary and unspecified malignant neoplasm of lymph node, unspecified: Secondary | ICD-10-CM

## 2013-11-03 DIAGNOSIS — D649 Anemia, unspecified: Secondary | ICD-10-CM

## 2013-11-03 DIAGNOSIS — C541 Malignant neoplasm of endometrium: Secondary | ICD-10-CM

## 2013-11-03 DIAGNOSIS — C562 Malignant neoplasm of left ovary: Secondary | ICD-10-CM

## 2013-11-03 DIAGNOSIS — M549 Dorsalgia, unspecified: Secondary | ICD-10-CM

## 2013-11-03 DIAGNOSIS — C55 Malignant neoplasm of uterus, part unspecified: Secondary | ICD-10-CM

## 2013-11-03 DIAGNOSIS — Z809 Family history of malignant neoplasm, unspecified: Secondary | ICD-10-CM

## 2013-11-03 DIAGNOSIS — R599 Enlarged lymph nodes, unspecified: Secondary | ICD-10-CM

## 2013-11-03 LAB — CBC WITH DIFFERENTIAL/PLATELET
BASO%: 0.2 % (ref 0.0–2.0)
BASOS ABS: 0 10*3/uL (ref 0.0–0.1)
EOS ABS: 0 10*3/uL (ref 0.0–0.5)
EOS%: 0.7 % (ref 0.0–7.0)
HEMATOCRIT: 25.9 % — AB (ref 34.8–46.6)
HGB: 8.6 g/dL — ABNORMAL LOW (ref 11.6–15.9)
LYMPH%: 20.7 % (ref 14.0–49.7)
MCH: 32.2 pg (ref 25.1–34.0)
MCHC: 33.2 g/dL (ref 31.5–36.0)
MCV: 97 fL (ref 79.5–101.0)
MONO#: 0.3 10*3/uL (ref 0.1–0.9)
MONO%: 7.5 % (ref 0.0–14.0)
NEUT#: 2.9 10*3/uL (ref 1.5–6.5)
NEUT%: 70.9 % (ref 38.4–76.8)
NRBC: 0 % (ref 0–0)
Platelets: 235 10*3/uL (ref 145–400)
RBC: 2.67 10*6/uL — AB (ref 3.70–5.45)
RDW: 17.2 % — ABNORMAL HIGH (ref 11.2–14.5)
WBC: 4.2 10*3/uL (ref 3.9–10.3)
lymph#: 0.9 10*3/uL (ref 0.9–3.3)

## 2013-11-03 LAB — COMPREHENSIVE METABOLIC PANEL (CC13)
ALT: 14 U/L (ref 0–55)
AST: 20 U/L (ref 5–34)
Albumin: 3.7 g/dL (ref 3.5–5.0)
Alkaline Phosphatase: 86 U/L (ref 40–150)
Anion Gap: 9 mEq/L (ref 3–11)
BILIRUBIN TOTAL: 0.34 mg/dL (ref 0.20–1.20)
BUN: 37 mg/dL — ABNORMAL HIGH (ref 7.0–26.0)
CALCIUM: 9.4 mg/dL (ref 8.4–10.4)
CHLORIDE: 106 meq/L (ref 98–109)
CO2: 23 mEq/L (ref 22–29)
CREATININE: 1.2 mg/dL — AB (ref 0.6–1.1)
Glucose: 74 mg/dl (ref 70–140)
Potassium: 4.1 mEq/L (ref 3.5–5.1)
Sodium: 138 mEq/L (ref 136–145)
Total Protein: 8.4 g/dL — ABNORMAL HIGH (ref 6.4–8.3)

## 2013-11-03 LAB — PROTIME-INR
INR: 2.8 (ref 2.00–3.50)
Protime: 33.6 Seconds — ABNORMAL HIGH (ref 10.6–13.4)

## 2013-11-03 LAB — POCT INR: INR: 2.8

## 2013-11-03 MED ORDER — HEPARIN SOD (PORK) LOCK FLUSH 100 UNIT/ML IV SOLN
500.0000 [IU] | Freq: Once | INTRAVENOUS | Status: AC
  Administered 2013-11-03: 500 [IU] via INTRAVENOUS
  Filled 2013-11-03: qty 5

## 2013-11-03 MED ORDER — SODIUM CHLORIDE 0.9 % IJ SOLN
10.0000 mL | INTRAMUSCULAR | Status: DC | PRN
  Administered 2013-11-03: 10 mL via INTRAVENOUS
  Filled 2013-11-03: qty 10

## 2013-11-03 NOTE — Progress Notes (Signed)
INR at goal Pt doing well with no complaints INR has been quite stable recently No medication or diet changes No missed or extra doses No unusual bleeding or bruising Plan: Continue Coumadin 10 mg daily.   Recheck her INR in 2 weeks with visit on 11/15/13  With scheduled appointments. We will see you in the infusion area  Coumadin samples; Coumadin 10 mg x 40 tabs LOT: [id number] 01/2015

## 2013-11-03 NOTE — Telephone Encounter (Signed)
Per staff message and POF I have scheduled appts. Advised scheduler of appts. JMW  
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
## 2013-11-03 NOTE — Telephone Encounter (Signed)
per pof to sch pt appt-sentr email to MW to sch trmt-will call pt once reply-gave pt copy of sch

## 2013-11-03 NOTE — Patient Instructions (Signed)
INR at goal No changes Continue Coumadin 10 mg daily.   Recheck her INR in 2 weeks with visit on 11/15/13  With scheduled appointments. We will see you in the infusion area

## 2013-11-03 NOTE — Patient Instructions (Signed)
Please let Dr Marko Plume know what Dr Dwyane Dee wants to do about the thyroid, in case we need to move chemotherapy

## 2013-11-04 ENCOUNTER — Other Ambulatory Visit: Payer: Self-pay | Admitting: Oncology

## 2013-11-04 DIAGNOSIS — C541 Malignant neoplasm of endometrium: Secondary | ICD-10-CM

## 2013-11-04 DIAGNOSIS — C562 Malignant neoplasm of left ovary: Secondary | ICD-10-CM

## 2013-11-05 ENCOUNTER — Telehealth: Payer: Self-pay | Admitting: Oncology

## 2013-11-05 NOTE — Telephone Encounter (Signed)
per  Gerald Stabs in [place] to sch appt 8/26-pt aware

## 2013-11-06 ENCOUNTER — Telehealth: Payer: Self-pay

## 2013-11-06 DIAGNOSIS — C541 Malignant neoplasm of endometrium: Secondary | ICD-10-CM

## 2013-11-06 DIAGNOSIS — I82409 Acute embolism and thrombosis of unspecified deep veins of unspecified lower extremity: Secondary | ICD-10-CM

## 2013-11-06 MED ORDER — DEXAMETHASONE 4 MG PO TABS: ORAL_TABLET | ORAL | Status: DC

## 2013-11-06 NOTE — Telephone Encounter (Signed)
Message copied by Baruch Merl on Mon Nov 06, 2013  8:45 AM ------      Message from: Gordy Levan      Created: Sat Nov 04, 2013  3:47 PM       Please refill decadron #[phone] for 3 treatments            Please let patient know that I would like her to stop Benicar/HCTZ for now, as BP is rather low and creatinine is a little higher than previously. She needs to push decaffeinated fluids between now and chemo. If she has BP cuff at home, please check with that daily, otherwise she will have vitals with Dr Ronnie Derby visit next week and when at this office            Thanks      Cc LA, KF. ------

## 2013-11-06 NOTE — Telephone Encounter (Signed)
Told Caitlyn Higgins to hold Benicar as noted below.  Pt. Does not have a BP cuff so BP to be checked at appt with Dr. Ronnie Derby office this week. Told her Decadron prmed called in as noted below to [place].  Pt. verbalized understanding.

## 2013-11-08 ENCOUNTER — Ambulatory Visit (INDEPENDENT_AMBULATORY_CARE_PROVIDER_SITE_OTHER): Payer: No Typology Code available for payment source | Admitting: Endocrinology

## 2013-11-08 ENCOUNTER — Encounter: Payer: Self-pay | Admitting: Endocrinology

## 2013-11-08 ENCOUNTER — Telehealth: Payer: Self-pay

## 2013-11-08 VITALS — BP 111/78 | HR 83 | Temp 97.7°F | Resp 16 | Ht 67.0 in | Wt 222.4 lb

## 2013-11-08 DIAGNOSIS — E041 Nontoxic single thyroid nodule: Secondary | ICD-10-CM | POA: Insufficient documentation

## 2013-11-08 LAB — TSH: TSH: 0.99 u[IU]/mL (ref 0.35–4.50)

## 2013-11-08 LAB — T4, FREE: FREE T4: 0.98 ng/dL (ref 0.60–1.60)

## 2013-11-08 NOTE — Telephone Encounter (Signed)
Told [name] that Dr. Marko Plume said she is to have her chemotherapy on 11-15-13.  Continue to stay off the benicar until Dr. Marko Plume says other wise.

## 2013-11-08 NOTE — Telephone Encounter (Signed)
[name] stated that she saw Dr. Dwyane Dee on 11-08-13 and he ordered  an [place] of her thyroid on 11-15-14.  Her BP was 111/78.on 11-08-13. Does she still keep her appointment for chemotherapy on 11-15-13?

## 2013-11-08 NOTE — Progress Notes (Signed)
Patient ID: Caitlyn Higgins, female   DOB: November 08, 1965, 48 y.o.   MRN: [id number]    Reason for Appointment: Thyroid nodule, new consultation    History of Present Illness:   The patient's thyroid enlargement was first discovered in 12/14 on a CT scan done for her chest This was found again on her CT scan recently in 6/15 The report shows a large left lobe of thyroid gland nodule extending into the superior mediastinum. This measures 3.5 cm and did not show any change from the previous evaluation The patient has not been told to have a thyroid enlargement on exam and is now being referred here by her oncologist  She has had difficulty no with swallowing   Does not feel like she has any choking sensation in her neck or pressure in any position or when lying down. She does not think she has had thyroid levels done by her primary care physician  No results found for this basename: freeT4, tsh   Patient also denies any unusual fatigue. She does tend to be cold intolerance but at night may have some hot flashes She had lost weight previously because of her cancer and has recently been regaining her weight. Her maximum weight has been about 270 pounds  Wt Readings from Last 3 Encounters:  11/08/13 222 lb 6.4 oz (100.88 kg)  11/03/13 217 lb 11.2 oz (98.748 kg)  10/12/13 210 lb 3.2 oz (95.346 kg)        Medication List       This list is accurate as of: 11/08/13  9:21 AM.  Always use your most recent med list.               allopurinol 100 MG tablet  Commonly known as:  ZYLOPRIM  Take 1 tablet (100 mg total) by mouth daily.     dexamethasone 4 MG tablet  Commonly known as:  DECADRON  Take 5 tabs 12 hr and 6 hr prior to taxol chemotherapy with food.     docusate sodium 100 MG capsule  Commonly known as:  COLACE  Take 100 mg by mouth daily.     ferrous fumarate 325 (106 FE) MG Tabs tablet  Commonly known as:  HEMOCYTE - 106 mg FE  Take 1 tablet (106 mg of iron total) by  mouth daily.     lidocaine-prilocaine cream  Commonly known as:  EMLA  Apply 1 application topically as needed (port).     loratadine 10 MG tablet  Commonly known as:  CLARITIN  Take 10 mg by mouth daily.     LORazepam 0.5 MG tablet  Commonly known as:  ATIVAN  Place 1-2 tablets under the tongue or swallow every 4-6 hours as needed for nausea.  Will make drowsy     olmesartan-hydrochlorothiazide 20-12.5 MG per tablet  Commonly known as:  BENICAR HCT  Take 1 tablet by mouth every morning.     omeprazole 40 MG capsule  Commonly known as:  PRILOSEC  Take 1 capsule (40 mg total) by mouth daily.     ondansetron 4 MG disintegrating tablet  Commonly known as:  ZOFRAN-ODT  Take 1-2 tablets (4-8 mg total) by mouth every 8 (eight) hours as needed for nausea or vomiting.     OxyCODONE 15 mg T12a 12 hr tablet  Commonly known as:  OXYCONTIN  Take 1 tablet (15 mg total) by mouth every 12 (twelve) hours.     Oxycodone HCl 10 MG Tabs  Take 1  tablet every 4-6 hours as needed for pain.     warfarin 5 MG tablet  Commonly known as:  COUMADIN  Take 10 mg by mouth every evening.        Allergies: No Known Allergies  Past Medical History  Diagnosis Date  . Reflux   . Hypertension   . Cancer     uterine carcinosarcoma  . DVT (deep venous thrombosis)     right leg 12'14  . Pulmonary emboli     12'14 denies any sob  . Elevated blood uric acid level     tx. Allopurinol  . GERD (gastroesophageal reflux disease)   . Transfusion history 07-26-13    12'14, 04-14-13(2 units), 06-09-13  . Anemia   . Family history of malignant neoplasm of breast   . Ovarian cancer 2015    Past Surgical History  Procedure Laterality Date  . No past surgeries    . Portacath placement Right     rigth chest- remains  . Ivc Right     filter placed to prevent Pulmonary emboli  . Abdominal hysterectomy N/A 08/01/2013    Procedure: EXPLORATORY LAPAROTOMY/HYSTERECTOMY TOTAL ABDOMINAL;  Surgeon: Janie Morning,  MD;  Location: [facility];  Service: Gynecology;  Laterality: N/A;  . Salpingoophorectomy Bilateral 08/01/2013    Procedure: BILATERAL SALPINGO OOPHORECTOMY/OMENTECTOMY ;  Surgeon: Janie Morning, MD;  Location: [facility];  Service: Gynecology;  Laterality: Bilateral;    Family History  Problem Relation Age of Onset  . Diabetes Mother   . Hypertension Mother   . Hypertension Father   . Diabetes Father   . Prostate cancer Father 8    deceased 40  . Breast cancer Maternal Aunt 74    deceased 26  . Prostate cancer Maternal Uncle     5 of 6 mat uncles with prostate cancer; deceased  . Breast cancer Paternal Aunt 67    deceased 37  . Stomach cancer Paternal Grandmother     deceased 58  . Breast cancer Maternal Aunt 6    deceased 74  . Breast cancer Cousin 61    negative BRCAplus, PALB2 2015  . Breast cancer Paternal Aunt 72    deceased 97    Social History:  reports that she has never smoked. She does not have any smokeless tobacco history on file. She reports that she does not drink alcohol or use illicit drugs.   Review of Systems:  Ovarian cancer present, on treatment, had surgery in 5/15 There is a positive  history of high blood pressure treated by her PCP.             No  history of Diabetes.       She is on Coumadin for history of DVT       Examination:   BP 111/78  Pulse 83  Temp(Src) 97.7 F (36.5 C)  Resp 16  Ht '5\' 7"'  (1.702 m)  Wt 222 lb 6.4 oz (100.88 kg)  BMI 34.82 kg/m2  SpO2 98%  LMP 03/09/2013   General Appearance: pleasant, generalized obesity present          Eyes: No abnormal prominence or eyelid swelling. No lid lag or stare.           Neck: The thyroid is palpable on the left side mostly on swallowing, has a globular smooth firm left medial lobe palpable probably about 3 cm in size. Right lobe is barely palpable on swallowing There is no stridor. Pemberton sign is negative There is  no lymphadenopathy in the neck .    Cardiovascular: Normal  heart  sounds, no murmur Respiratory:  Lungs clear Neurological: REFLEXES: at biceps are normal. No tremor present   Skin: no rash or lesions        Assessment/Plan:  She appears to have a relatively large left lobe nodule present which apparently has been stable on her CT scans that have been done about 6 months apart She may have a right lobe enlargement also as noted on today's exam Discussed with the patient that although she is asymptomatic from the left lobe enlargement there is a 5% chance of malignancy in general in thyroid nodules Further evaluation will be done with thyroid functions today and also thyroid ultrasound However if she has subclinical hyperthyroidism may consider a nuclear thyroid scan instead  [facility] 11/08/2013

## 2013-11-13 ENCOUNTER — Other Ambulatory Visit: Payer: Self-pay

## 2013-11-13 DIAGNOSIS — C541 Malignant neoplasm of endometrium: Secondary | ICD-10-CM

## 2013-11-13 MED ORDER — OXYCODONE HCL 10 MG PO TABS: ORAL_TABLET | ORAL | Status: DC

## 2013-11-13 MED ORDER — OXYCODONE HCL ER 15 MG PO T12A: 15.0000 mg | EXTENDED_RELEASE_TABLET | Freq: Two times a day (BID) | ORAL | Status: DC

## 2013-11-13 NOTE — Telephone Encounter (Signed)
[name] will pick up her prescriptions for her pain medications tomorrow from the injection nurse.

## 2013-11-14 ENCOUNTER — Ambulatory Visit
Admission: RE | Admit: 2013-11-14 | Discharge: 2013-11-14 | Disposition: A | Discharge status: Home / Self Care | Payer: No Typology Code available for payment source | Source: Ambulatory Visit | Attending: Endocrinology | Admitting: Endocrinology

## 2013-11-14 DIAGNOSIS — E041 Nontoxic single thyroid nodule: Secondary | ICD-10-CM

## 2013-11-15 ENCOUNTER — Other Ambulatory Visit: Payer: Self-pay | Admitting: Oncology

## 2013-11-15 ENCOUNTER — Ambulatory Visit: Payer: No Typology Code available for payment source

## 2013-11-15 ENCOUNTER — Ambulatory Visit (HOSPITAL_BASED_OUTPATIENT_CLINIC_OR_DEPARTMENT_OTHER): Payer: Self-pay | Admitting: Pharmacist

## 2013-11-15 ENCOUNTER — Ambulatory Visit (HOSPITAL_BASED_OUTPATIENT_CLINIC_OR_DEPARTMENT_OTHER): Payer: No Typology Code available for payment source

## 2013-11-15 ENCOUNTER — Other Ambulatory Visit (HOSPITAL_BASED_OUTPATIENT_CLINIC_OR_DEPARTMENT_OTHER): Payer: No Typology Code available for payment source

## 2013-11-15 VITALS — BP 109/77 | HR 89 | Temp 97.6°F | Resp 20

## 2013-11-15 DIAGNOSIS — I824Y9 Acute embolism and thrombosis of unspecified deep veins of unspecified proximal lower extremity: Secondary | ICD-10-CM

## 2013-11-15 DIAGNOSIS — I82409 Acute embolism and thrombosis of unspecified deep veins of unspecified lower extremity: Secondary | ICD-10-CM

## 2013-11-15 DIAGNOSIS — C541 Malignant neoplasm of endometrium: Secondary | ICD-10-CM

## 2013-11-15 DIAGNOSIS — I82401 Acute embolism and thrombosis of unspecified deep veins of right lower extremity: Secondary | ICD-10-CM

## 2013-11-15 DIAGNOSIS — Z95828 Presence of other vascular implants and grafts: Secondary | ICD-10-CM

## 2013-11-15 DIAGNOSIS — C55 Malignant neoplasm of uterus, part unspecified: Secondary | ICD-10-CM

## 2013-11-15 DIAGNOSIS — Z5111 Encounter for antineoplastic chemotherapy: Secondary | ICD-10-CM

## 2013-11-15 DIAGNOSIS — C562 Malignant neoplasm of left ovary: Secondary | ICD-10-CM

## 2013-11-15 LAB — CBC WITH DIFFERENTIAL/PLATELET
BASO%: 0.1 % (ref 0.0–2.0)
BASOS ABS: 0 10*3/uL (ref 0.0–0.1)
EOS%: 0 % (ref 0.0–7.0)
Eosinophils Absolute: 0 10*3/uL (ref 0.0–0.5)
HEMATOCRIT: 29.5 % — AB (ref 34.8–46.6)
HEMOGLOBIN: 9.7 g/dL — AB (ref 11.6–15.9)
LYMPH#: 0.3 10*3/uL — AB (ref 0.9–3.3)
LYMPH%: 7.8 % — ABNORMAL LOW (ref 14.0–49.7)
MCH: 32.4 pg (ref 25.1–34.0)
MCHC: 32.7 g/dL (ref 31.5–36.0)
MCV: 98.9 fL (ref 79.5–101.0)
MONO#: 0 10*3/uL — ABNORMAL LOW (ref 0.1–0.9)
MONO%: 0.9 % (ref 0.0–14.0)
NEUT#: 3.6 10*3/uL (ref 1.5–6.5)
NEUT%: 91.2 % — ABNORMAL HIGH (ref 38.4–76.8)
Platelets: 243 10*3/uL (ref 145–400)
RBC: 2.98 10*6/uL — ABNORMAL LOW (ref 3.70–5.45)
RDW: 17.5 % — ABNORMAL HIGH (ref 11.2–14.5)
WBC: 3.9 10*3/uL (ref 3.9–10.3)

## 2013-11-15 LAB — COMPREHENSIVE METABOLIC PANEL (CC13)
ALBUMIN: 3.9 g/dL (ref 3.5–5.0)
ALT: 15 U/L (ref 0–55)
ANION GAP: 9 meq/L (ref 3–11)
AST: 24 U/L (ref 5–34)
Alkaline Phosphatase: 92 U/L (ref 40–150)
BUN: 21.8 mg/dL (ref 7.0–26.0)
CO2: 25 mEq/L (ref 22–29)
Calcium: 9.7 mg/dL (ref 8.4–10.4)
Chloride: 105 mEq/L (ref 98–109)
Creatinine: 1.1 mg/dL (ref 0.6–1.1)
Glucose: 155 mg/dl — ABNORMAL HIGH (ref 70–140)
POTASSIUM: 4.3 meq/L (ref 3.5–5.1)
Sodium: 139 mEq/L (ref 136–145)
Total Bilirubin: 0.48 mg/dL (ref 0.20–1.20)
Total Protein: 9.3 g/dL — ABNORMAL HIGH (ref 6.4–8.3)

## 2013-11-15 LAB — PROTIME-INR
INR: 2.2 (ref 2.00–3.50)
Protime: 26.4 Seconds — ABNORMAL HIGH (ref 10.6–13.4)

## 2013-11-15 LAB — POCT INR: INR: 2.2

## 2013-11-15 MED ORDER — SODIUM CHLORIDE 0.9 % IV SOLN
Freq: Once | INTRAVENOUS | Status: AC
  Administered 2013-11-15: 12:00:00 via INTRAVENOUS

## 2013-11-15 MED ORDER — SODIUM CHLORIDE 0.9 % IJ SOLN
10.0000 mL | INTRAMUSCULAR | Status: DC | PRN
  Administered 2013-11-15: 10 mL via INTRAVENOUS
  Filled 2013-11-15: qty 10

## 2013-11-15 MED ORDER — HEPARIN SOD (PORK) LOCK FLUSH 100 UNIT/ML IV SOLN
500.0000 [IU] | Freq: Once | INTRAVENOUS | Status: AC
  Administered 2013-11-15: 500 [IU] via INTRAVENOUS
  Filled 2013-11-15: qty 5

## 2013-11-15 MED ORDER — DIPHENHYDRAMINE HCL 50 MG/ML IJ SOLN
50.0000 mg | Freq: Once | INTRAMUSCULAR | Status: AC
  Administered 2013-11-15: 50 mg via INTRAVENOUS

## 2013-11-15 MED ORDER — ONDANSETRON 16 MG/50ML IVPB (CHCC)
16.0000 mg | Freq: Once | INTRAVENOUS | Status: AC
Start: 2013-11-15 — End: 2013-11-15
  Administered 2013-11-15: 16 mg via INTRAVENOUS

## 2013-11-15 MED ORDER — DEXAMETHASONE SODIUM PHOSPHATE 20 MG/5ML IJ SOLN
INTRAMUSCULAR | Status: AC
  Filled 2013-11-15: qty 5

## 2013-11-15 MED ORDER — FAMOTIDINE IN NACL 20-0.9 MG/50ML-% IV SOLN
INTRAVENOUS | Status: AC
  Filled 2013-11-15: qty 50

## 2013-11-15 MED ORDER — DEXAMETHASONE SODIUM PHOSPHATE 20 MG/5ML IJ SOLN
20.0000 mg | Freq: Once | INTRAMUSCULAR | Status: AC
  Administered 2013-11-15: 20 mg via INTRAVENOUS

## 2013-11-15 MED ORDER — DIPHENHYDRAMINE HCL 50 MG/ML IJ SOLN
INTRAMUSCULAR | Status: AC
  Filled 2013-11-15: qty 1

## 2013-11-15 MED ORDER — ONDANSETRON 16 MG/50ML IVPB (CHCC)
INTRAVENOUS | Status: AC
  Filled 2013-11-15: qty 16

## 2013-11-15 MED ORDER — SODIUM CHLORIDE 0.9 % IV SOLN
353.7000 mg | Freq: Once | INTRAVENOUS | Status: AC
  Administered 2013-11-15: 350 mg via INTRAVENOUS
  Filled 2013-11-15: qty 35

## 2013-11-15 MED ORDER — FAMOTIDINE IN NACL 20-0.9 MG/50ML-% IV SOLN
20.0000 mg | Freq: Once | INTRAVENOUS | Status: AC
  Administered 2013-11-15: 20 mg via INTRAVENOUS

## 2013-11-15 MED ORDER — PACLITAXEL CHEMO INJECTION 300 MG/50ML
175.0000 mg/m2 | Freq: Once | INTRAVENOUS | Status: AC
  Administered 2013-11-15: 342 mg via INTRAVENOUS
  Filled 2013-11-15: qty 57

## 2013-11-15 MED ORDER — CARBOPLATIN CHEMO INTRADERMAL TEST DOSE 100MCG/0.02ML
100.0000 ug | Freq: Once | INTRADERMAL | Status: AC
  Administered 2013-11-15: 100 ug via INTRADERMAL
  Filled 2013-11-15: qty 0.01

## 2013-11-15 NOTE — Patient Instructions (Signed)
Continue Coumadin 10 mg daily.   Recheck INR in 2 weeks with visits on 11/29/13 with scheduled appointments:   Lab at 1:15pm, Flush at 1:30pm, Dr. Marko Plume at 2pm and coumadin clinic at 2:30pm.

## 2013-11-15 NOTE — Patient Instructions (Signed)

## 2013-11-15 NOTE — Progress Notes (Signed)
INR within goal today. Hg/Hct:  9.7/29.5, Pltc:  243 Pt to restart chemo today (carbo/taxol). She restarted dexamethasone prior to chemo. No other changes in medications.  She also started juicing with kale, parsley,and frozen fruits for her breakfast meal.   She has been juicing for about 1 week. She also started walking. No problems or concerns regarding anticoagulation. No unusual bruising or bleeding. No s/s of clotting noted. Pt aware that kale and parsley may effect her INR. She has been juicing for about 1 week. Only a slight decrease in her INR. Her eating habits may also change with restarting chemo/dexamethasone. Will continue Coumadin 10 mg daily.   Recheck INR in 2 weeks with visits on 11/29/13 with scheduled appointments:   Lab at 1:15pm, Flush at 1:30pm, Dr. Marko Plume at 2pm and coumadin clinic at 2:30pm.

## 2013-11-15 NOTE — Patient Instructions (Signed)
[facility] Discharge Instructions for Patients Receiving Chemotherapy  Today you received the following chemotherapy agents:  Taxol and Carboplatin  To help prevent nausea and vomiting after your treatment, we encourage you to take your nausea medication as ordered per MD.   If you develop nausea and vomiting that is not controlled by your nausea medication, call the clinic.   BELOW ARE SYMPTOMS THAT SHOULD BE REPORTED IMMEDIATELY:  *FEVER GREATER THAN 100.5 F  *CHILLS WITH OR WITHOUT FEVER  NAUSEA AND VOMITING THAT IS NOT CONTROLLED WITH YOUR NAUSEA MEDICATION  *UNUSUAL SHORTNESS OF BREATH  *UNUSUAL BRUISING OR BLEEDING  TENDERNESS IN MOUTH AND THROAT WITH OR WITHOUT PRESENCE OF ULCERS  *URINARY PROBLEMS  *BOWEL PROBLEMS  UNUSUAL RASH Items with * indicate a potential emergency and should be followed up as soon as possible.  Feel free to call the clinic you have any questions or concerns. The clinic phone number is [phone].    

## 2013-11-16 ENCOUNTER — Telehealth: Payer: Self-pay | Admitting: Oncology

## 2013-11-16 NOTE — Telephone Encounter (Signed)
per Aaron Edelman CC to sch coum-pt aware

## 2013-11-19 ENCOUNTER — Other Ambulatory Visit: Payer: Self-pay | Admitting: Endocrinology

## 2013-11-19 DIAGNOSIS — E041 Nontoxic single thyroid nodule: Secondary | ICD-10-CM

## 2013-11-19 NOTE — Progress Notes (Signed)
Quick Note:  Her thyroid ultrasound shows a 4.4 cm nodule and will need to get a needle aspiration biopsy done, referral made for radiology to do this ______

## 2013-11-22 ENCOUNTER — Ambulatory Visit (HOSPITAL_BASED_OUTPATIENT_CLINIC_OR_DEPARTMENT_OTHER): Payer: No Typology Code available for payment source

## 2013-11-22 VITALS — BP 129/79 | HR 91 | Temp 98.3°F

## 2013-11-22 DIAGNOSIS — Z23 Encounter for immunization: Secondary | ICD-10-CM

## 2013-11-22 DIAGNOSIS — C55 Malignant neoplasm of uterus, part unspecified: Secondary | ICD-10-CM

## 2013-11-22 DIAGNOSIS — C541 Malignant neoplasm of endometrium: Secondary | ICD-10-CM

## 2013-11-22 DIAGNOSIS — Z5189 Encounter for other specified aftercare: Secondary | ICD-10-CM

## 2013-11-22 MED ORDER — PEGFILGRASTIM INJECTION 6 MG/0.6ML
6.0000 mg | Freq: Once | SUBCUTANEOUS | Status: AC
  Administered 2013-11-22: 6 mg via SUBCUTANEOUS
  Filled 2013-11-22: qty 0.6

## 2013-11-27 ENCOUNTER — Other Ambulatory Visit: Payer: Self-pay | Admitting: Oncology

## 2013-11-27 DIAGNOSIS — C562 Malignant neoplasm of left ovary: Secondary | ICD-10-CM

## 2013-11-27 DIAGNOSIS — C541 Malignant neoplasm of endometrium: Secondary | ICD-10-CM

## 2013-11-29 ENCOUNTER — Ambulatory Visit (HOSPITAL_BASED_OUTPATIENT_CLINIC_OR_DEPARTMENT_OTHER): Payer: No Typology Code available for payment source

## 2013-11-29 ENCOUNTER — Other Ambulatory Visit (HOSPITAL_BASED_OUTPATIENT_CLINIC_OR_DEPARTMENT_OTHER): Payer: No Typology Code available for payment source

## 2013-11-29 ENCOUNTER — Telehealth: Payer: Self-pay | Admitting: Oncology

## 2013-11-29 ENCOUNTER — Telehealth: Payer: Self-pay | Admitting: *Deleted

## 2013-11-29 ENCOUNTER — Ambulatory Visit (HOSPITAL_BASED_OUTPATIENT_CLINIC_OR_DEPARTMENT_OTHER): Payer: No Typology Code available for payment source | Admitting: Oncology

## 2013-11-29 ENCOUNTER — Encounter: Payer: Self-pay | Admitting: Oncology

## 2013-11-29 ENCOUNTER — Ambulatory Visit: Payer: No Typology Code available for payment source

## 2013-11-29 VITALS — BP 113/78 | HR 75 | Temp 98.1°F | Resp 20 | Ht 67.0 in | Wt 227.2 lb

## 2013-11-29 DIAGNOSIS — I82401 Acute embolism and thrombosis of unspecified deep veins of right lower extremity: Secondary | ICD-10-CM

## 2013-11-29 DIAGNOSIS — Z86711 Personal history of pulmonary embolism: Secondary | ICD-10-CM

## 2013-11-29 DIAGNOSIS — I824Y9 Acute embolism and thrombosis of unspecified deep veins of unspecified proximal lower extremity: Secondary | ICD-10-CM

## 2013-11-29 DIAGNOSIS — Z95828 Presence of other vascular implants and grafts: Secondary | ICD-10-CM

## 2013-11-29 DIAGNOSIS — C562 Malignant neoplasm of left ovary: Secondary | ICD-10-CM

## 2013-11-29 DIAGNOSIS — C541 Malignant neoplasm of endometrium: Secondary | ICD-10-CM

## 2013-11-29 DIAGNOSIS — C55 Malignant neoplasm of uterus, part unspecified: Secondary | ICD-10-CM

## 2013-11-29 DIAGNOSIS — R599 Enlarged lymph nodes, unspecified: Secondary | ICD-10-CM

## 2013-11-29 DIAGNOSIS — I82409 Acute embolism and thrombosis of unspecified deep veins of unspecified lower extremity: Secondary | ICD-10-CM

## 2013-11-29 DIAGNOSIS — Z23 Encounter for immunization: Secondary | ICD-10-CM

## 2013-11-29 DIAGNOSIS — C549 Malignant neoplasm of corpus uteri, unspecified: Secondary | ICD-10-CM

## 2013-11-29 DIAGNOSIS — Z808 Family history of malignant neoplasm of other organs or systems: Secondary | ICD-10-CM

## 2013-11-29 DIAGNOSIS — Z86718 Personal history of other venous thrombosis and embolism: Secondary | ICD-10-CM

## 2013-11-29 DIAGNOSIS — E049 Nontoxic goiter, unspecified: Secondary | ICD-10-CM

## 2013-11-29 LAB — CBC WITH DIFFERENTIAL/PLATELET
BASO%: 0.2 % (ref 0.0–2.0)
Basophils Absolute: 0 10*3/uL (ref 0.0–0.1)
EOS%: 0 % (ref 0.0–7.0)
Eosinophils Absolute: 0 10*3/uL (ref 0.0–0.5)
HCT: 26 % — ABNORMAL LOW (ref 34.8–46.6)
HGB: 8.4 g/dL — ABNORMAL LOW (ref 11.6–15.9)
LYMPH%: 9.5 % — AB (ref 14.0–49.7)
MCH: 32.3 pg (ref 25.1–34.0)
MCHC: 32.3 g/dL (ref 31.5–36.0)
MCV: 100 fL (ref 79.5–101.0)
MONO#: 1.2 10*3/uL — AB (ref 0.1–0.9)
MONO%: 9.2 % (ref 0.0–14.0)
NEUT#: 10.5 10*3/uL — ABNORMAL HIGH (ref 1.5–6.5)
NEUT%: 81.1 % — ABNORMAL HIGH (ref 38.4–76.8)
PLATELETS: 163 10*3/uL (ref 145–400)
RBC: 2.6 10*6/uL — ABNORMAL LOW (ref 3.70–5.45)
RDW: 15.9 % — ABNORMAL HIGH (ref 11.2–14.5)
WBC: 13 10*3/uL — ABNORMAL HIGH (ref 3.9–10.3)
lymph#: 1.2 10*3/uL (ref 0.9–3.3)

## 2013-11-29 LAB — COMPREHENSIVE METABOLIC PANEL (CC13)
ALK PHOS: 95 U/L (ref 40–150)
ALT: 20 U/L (ref 0–55)
ANION GAP: 9 meq/L (ref 3–11)
AST: 30 U/L (ref 5–34)
Albumin: 3.5 g/dL (ref 3.5–5.0)
BILIRUBIN TOTAL: 0.31 mg/dL (ref 0.20–1.20)
BUN: 14.6 mg/dL (ref 7.0–26.0)
CO2: 26 meq/L (ref 22–29)
Calcium: 8.5 mg/dL (ref 8.4–10.4)
Chloride: 105 mEq/L (ref 98–109)
Creatinine: 1 mg/dL (ref 0.6–1.1)
Glucose: 86 mg/dl (ref 70–140)
Potassium: 4.1 mEq/L (ref 3.5–5.1)
SODIUM: 139 meq/L (ref 136–145)
TOTAL PROTEIN: 8.1 g/dL (ref 6.4–8.3)

## 2013-11-29 LAB — POCT INR: INR: 3

## 2013-11-29 LAB — PROTIME-INR
INR: 3 (ref 2.00–3.50)
Protime: 36 Seconds — ABNORMAL HIGH (ref 10.6–13.4)

## 2013-11-29 MED ORDER — HEPARIN SOD (PORK) LOCK FLUSH 100 UNIT/ML IV SOLN
500.0000 [IU] | Freq: Once | INTRAVENOUS | Status: AC
  Administered 2013-11-29: 500 [IU] via INTRAVENOUS
  Filled 2013-11-29: qty 5

## 2013-11-29 MED ORDER — INFLUENZA VAC SPLIT QUAD 0.5 ML IM SUSY
0.5000 mL | PREFILLED_SYRINGE | Freq: Once | INTRAMUSCULAR | Status: AC
  Administered 2013-11-29: 0.5 mL via INTRAMUSCULAR
  Filled 2013-11-29: qty 0.5

## 2013-11-29 MED ORDER — SODIUM CHLORIDE 0.9 % IJ SOLN
10.0000 mL | INTRAMUSCULAR | Status: DC | PRN
  Administered 2013-11-29: 10 mL via INTRAVENOUS
  Filled 2013-11-29: qty 10
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
## 2013-11-29 NOTE — Progress Notes (Addendum)
[name]'s INR is 3.0 today, which is within goal range of 2-3. She currently takes 10 mg of Coumadin daily. She denies any medication changes, and reports no extra or missed doses of her Coumadin. [name] states she has continued juicing using greens such as kale, but says she has remained consistent with her intake.  She describes no bleeding and/or unusual bruising. Also of note, patient has a thyroid biopsy ordered, but the specific date has not been decided. We received a forwarded message from that physician's office (Dr. Elayne Snare) that coumadin will need to be held for 4 days prior. Dr. Marko Plume is aware.  Plan: Continue Coumadin 10 mg by mouth daily.   Recheck INR in 2 weeks with visits on 12/14/13 with scheduled injection appointment:  lab at 10:45am, injection at 11:15am, and coumadin clinic at 11:45 am.

## 2013-11-29 NOTE — Telephone Encounter (Signed)
per pof to sch appt-gave pt copy of sch °

## 2013-11-29 NOTE — Telephone Encounter (Signed)
Per staff message and POF I have scheduled appts. Advised scheduler of appts. JMW  

## 2013-11-29 NOTE — Patient Instructions (Signed)

## 2013-11-29 NOTE — Telephone Encounter (Signed)
per Marolyn Hammock to sch CC & lab-pt aware

## 2013-11-30 ENCOUNTER — Telehealth: Payer: Self-pay | Admitting: Oncology

## 2013-11-30 LAB — CA 125(PREVIOUS METHOD): CA 125: 5 U/mL (ref 0.0–30.2)

## 2013-11-30 LAB — CA 125: CA 125: 7 U/mL (ref <35)

## 2013-11-30 NOTE — Telephone Encounter (Signed)
cld 7 spoke to pt and adv of appt on 9/17-pt understood-stated will get updated appt when she comes then.

## 2013-12-04 ENCOUNTER — Encounter: Payer: Self-pay | Admitting: Pharmacist

## 2013-12-04 NOTE — Progress Notes (Signed)
I spoke to Caitlyn Levy, RN from [facility].  Dr. Marko Plume approved Caitlyn Higgins holding coumadin 4 days prior to thyroid biopsy on 12/12/13.  I spoke with and have scheduled a lab appt with Caitlyn [name] on 9/22 prior to procedure and pt knows to take INR results with her.

## 2013-12-06 ENCOUNTER — Ambulatory Visit: Payer: No Typology Code available for payment source

## 2013-12-07 ENCOUNTER — Other Ambulatory Visit (HOSPITAL_BASED_OUTPATIENT_CLINIC_OR_DEPARTMENT_OTHER): Payer: No Typology Code available for payment source

## 2013-12-07 ENCOUNTER — Ambulatory Visit (HOSPITAL_BASED_OUTPATIENT_CLINIC_OR_DEPARTMENT_OTHER): Payer: No Typology Code available for payment source

## 2013-12-07 ENCOUNTER — Other Ambulatory Visit: Payer: No Typology Code available for payment source

## 2013-12-07 ENCOUNTER — Ambulatory Visit: Payer: No Typology Code available for payment source

## 2013-12-07 VITALS — BP 116/81 | HR 72 | Temp 97.7°F | Resp 20

## 2013-12-07 DIAGNOSIS — Z5111 Encounter for antineoplastic chemotherapy: Secondary | ICD-10-CM

## 2013-12-07 DIAGNOSIS — I82401 Acute embolism and thrombosis of unspecified deep veins of right lower extremity: Secondary | ICD-10-CM

## 2013-12-07 DIAGNOSIS — C55 Malignant neoplasm of uterus, part unspecified: Secondary | ICD-10-CM

## 2013-12-07 DIAGNOSIS — C787 Secondary malignant neoplasm of liver and intrahepatic bile duct: Secondary | ICD-10-CM

## 2013-12-07 DIAGNOSIS — Z95828 Presence of other vascular implants and grafts: Secondary | ICD-10-CM

## 2013-12-07 DIAGNOSIS — C562 Malignant neoplasm of left ovary: Secondary | ICD-10-CM

## 2013-12-07 DIAGNOSIS — Z86711 Personal history of pulmonary embolism: Secondary | ICD-10-CM

## 2013-12-07 DIAGNOSIS — Z86718 Personal history of other venous thrombosis and embolism: Secondary | ICD-10-CM

## 2013-12-07 DIAGNOSIS — C541 Malignant neoplasm of endometrium: Secondary | ICD-10-CM

## 2013-12-07 LAB — COMPREHENSIVE METABOLIC PANEL
ALBUMIN: 4 g/dL (ref 3.5–5.2)
ALT: 16 U/L (ref 0–35)
AST: 25 U/L (ref 0–37)
Alkaline Phosphatase: 98 U/L (ref 39–117)
BUN: 26 mg/dL — ABNORMAL HIGH (ref 6–23)
CO2: 23 meq/L (ref 19–32)
Calcium: 10 mg/dL (ref 8.4–10.5)
Chloride: 103 mEq/L (ref 96–112)
Creatinine, Ser: 0.9 mg/dL (ref 0.50–1.10)
GLUCOSE: 133 mg/dL — AB (ref 70–99)
POTASSIUM: 4.5 meq/L (ref 3.5–5.3)
Sodium: 139 mEq/L (ref 135–145)
Total Bilirubin: 0.4 mg/dL (ref 0.2–1.2)
Total Protein: 9.6 g/dL — ABNORMAL HIGH (ref 6.0–8.3)

## 2013-12-07 LAB — CBC WITH DIFFERENTIAL/PLATELET
BASO%: 0 % (ref 0.0–2.0)
BASOS ABS: 0 10*3/uL (ref 0.0–0.1)
EOS ABS: 0 10*3/uL (ref 0.0–0.5)
EOS%: 0.1 % (ref 0.0–7.0)
HCT: 29.9 % — ABNORMAL LOW (ref 34.8–46.6)
HEMOGLOBIN: 9.8 g/dL — AB (ref 11.6–15.9)
LYMPH%: 5.8 % — ABNORMAL LOW (ref 14.0–49.7)
MCH: 32.7 pg (ref 25.1–34.0)
MCHC: 32.8 g/dL (ref 31.5–36.0)
MCV: 99.9 fL (ref 79.5–101.0)
MONO#: 0 10*3/uL — ABNORMAL LOW (ref 0.1–0.9)
MONO%: 0.7 % (ref 0.0–14.0)
NEUT%: 93.4 % — ABNORMAL HIGH (ref 38.4–76.8)
NEUTROS ABS: 4.4 10*3/uL (ref 1.5–6.5)
PLATELETS: 216 10*3/uL (ref 145–400)
RBC: 3 10*6/uL — ABNORMAL LOW (ref 3.70–5.45)
RDW: 17.3 % — AB (ref 11.2–14.5)
WBC: 4.7 10*3/uL (ref 3.9–10.3)
lymph#: 0.3 10*3/uL — ABNORMAL LOW (ref 0.9–3.3)

## 2013-12-07 LAB — PROTIME-INR
INR: 2.8 (ref 2.00–3.50)
Protime: 33.6 Seconds — ABNORMAL HIGH (ref 10.6–13.4)

## 2013-12-07 MED ORDER — DEXAMETHASONE SODIUM PHOSPHATE 20 MG/5ML IJ SOLN
INTRAMUSCULAR | Status: AC
  Filled 2013-12-07: qty 5

## 2013-12-07 MED ORDER — ONDANSETRON 16 MG/50ML IVPB (CHCC)
INTRAVENOUS | Status: AC
  Filled 2013-12-07: qty 16

## 2013-12-07 MED ORDER — SODIUM CHLORIDE 0.9 % IV SOLN
Freq: Once | INTRAVENOUS | Status: AC
  Administered 2013-12-07: 13:00:00 via INTRAVENOUS

## 2013-12-07 MED ORDER — SODIUM CHLORIDE 0.9 % IJ SOLN
10.0000 mL | INTRAMUSCULAR | Status: DC | PRN
  Administered 2013-12-07: 10 mL via INTRAVENOUS
  Filled 2013-12-07: qty 10

## 2013-12-07 MED ORDER — FAMOTIDINE IN NACL 20-0.9 MG/50ML-% IV SOLN
INTRAVENOUS | Status: AC
  Filled 2013-12-07: qty 50

## 2013-12-07 MED ORDER — CARBOPLATIN CHEMO INTRADERMAL TEST DOSE 100MCG/0.02ML
100.0000 ug | Freq: Once | INTRADERMAL | Status: AC
  Administered 2013-12-07: 100 ug via INTRADERMAL
  Filled 2013-12-07: qty 0.01

## 2013-12-07 MED ORDER — DIPHENHYDRAMINE HCL 50 MG/ML IJ SOLN
INTRAMUSCULAR | Status: AC
  Filled 2013-12-07: qty 1

## 2013-12-07 MED ORDER — ONDANSETRON 16 MG/50ML IVPB (CHCC)
16.0000 mg | Freq: Once | INTRAVENOUS | Status: AC
  Administered 2013-12-07: 16 mg via INTRAVENOUS

## 2013-12-07 MED ORDER — DIPHENHYDRAMINE HCL 50 MG/ML IJ SOLN
50.0000 mg | Freq: Once | INTRAMUSCULAR | Status: AC
  Administered 2013-12-07: 50 mg via INTRAVENOUS

## 2013-12-07 MED ORDER — FAMOTIDINE IN NACL 20-0.9 MG/50ML-% IV SOLN
20.0000 mg | Freq: Once | INTRAVENOUS | Status: AC
  Administered 2013-12-07: 20 mg via INTRAVENOUS

## 2013-12-07 MED ORDER — HEPARIN SOD (PORK) LOCK FLUSH 100 UNIT/ML IV SOLN
500.0000 [IU] | Freq: Once | INTRAVENOUS | Status: AC | PRN
  Administered 2013-12-07: 500 [IU]
  Filled 2013-12-07: qty 5

## 2013-12-07 MED ORDER — SODIUM CHLORIDE 0.9 % IJ SOLN
10.0000 mL | INTRAMUSCULAR | Status: DC | PRN
  Administered 2013-12-07: 10 mL
  Filled 2013-12-07: qty 10

## 2013-12-07 MED ORDER — DEXAMETHASONE SODIUM PHOSPHATE 20 MG/5ML IJ SOLN
20.0000 mg | Freq: Once | INTRAMUSCULAR | Status: AC
  Administered 2013-12-07: 20 mg via INTRAVENOUS

## 2013-12-07 MED ORDER — PACLITAXEL CHEMO INJECTION 300 MG/50ML
175.0000 mg/m2 | Freq: Once | INTRAVENOUS | Status: AC
  Administered 2013-12-07: 342 mg via INTRAVENOUS
  Filled 2013-12-07: qty 57

## 2013-12-07 MED ORDER — SODIUM CHLORIDE 0.9 % IV SOLN
550.0000 mg | Freq: Once | INTRAVENOUS | Status: AC
  Administered 2013-12-07: 550 mg via INTRAVENOUS
  Filled 2013-12-07: qty 55

## 2013-12-07 NOTE — Progress Notes (Signed)
Okay to treat today despite BUN of 26, per Dr. Marko Plume.

## 2013-12-07 NOTE — Patient Instructions (Signed)
[facility] Discharge Instructions for Patients Receiving Chemotherapy  Today you received the following chemotherapy agents:  Taxol & Carboplatin  To help prevent nausea and vomiting after your treatment, we encourage you to take your nausea medication    If you develop nausea and vomiting that is not controlled by your nausea medication, call the clinic.   BELOW ARE SYMPTOMS THAT SHOULD BE REPORTED IMMEDIATELY:  *FEVER GREATER THAN 100.5 F  *CHILLS WITH OR WITHOUT FEVER  NAUSEA AND VOMITING THAT IS NOT CONTROLLED WITH YOUR NAUSEA MEDICATION  *UNUSUAL SHORTNESS OF BREATH  *UNUSUAL BRUISING OR BLEEDING  TENDERNESS IN MOUTH AND THROAT WITH OR WITHOUT PRESENCE OF ULCERS  *URINARY PROBLEMS  *BOWEL PROBLEMS  UNUSUAL RASH Items with * indicate a potential emergency and should be followed up as soon as possible.  Feel free to call the clinic you have any questions or concerns. The clinic phone number is [phone].    

## 2013-12-12 ENCOUNTER — Other Ambulatory Visit (HOSPITAL_BASED_OUTPATIENT_CLINIC_OR_DEPARTMENT_OTHER): Payer: No Typology Code available for payment source

## 2013-12-12 ENCOUNTER — Other Ambulatory Visit (HOSPITAL_COMMUNITY)
Admission: RE | Admit: 2013-12-12 | Discharge: 2013-12-12 | Disposition: A | Discharge status: Home / Self Care | Payer: No Typology Code available for payment source | Source: Ambulatory Visit | Attending: Interventional Radiology | Admitting: Interventional Radiology

## 2013-12-12 ENCOUNTER — Ambulatory Visit
Admission: RE | Admit: 2013-12-12 | Discharge: 2013-12-12 | Disposition: A | Discharge status: Home / Self Care | Payer: No Typology Code available for payment source | Source: Ambulatory Visit | Attending: Endocrinology | Admitting: Endocrinology

## 2013-12-12 DIAGNOSIS — I82409 Acute embolism and thrombosis of unspecified deep veins of unspecified lower extremity: Secondary | ICD-10-CM

## 2013-12-12 DIAGNOSIS — E041 Nontoxic single thyroid nodule: Secondary | ICD-10-CM

## 2013-12-12 DIAGNOSIS — I82401 Acute embolism and thrombosis of unspecified deep veins of right lower extremity: Secondary | ICD-10-CM

## 2013-12-12 DIAGNOSIS — I2699 Other pulmonary embolism without acute cor pulmonale: Secondary | ICD-10-CM

## 2013-12-12 LAB — PROTIME-INR
INR: 1.1 — AB (ref 2.00–3.50)
Protime: 13.2 Seconds (ref 10.6–13.4)

## 2013-12-13 ENCOUNTER — Other Ambulatory Visit: Payer: Self-pay | Admitting: Oncology

## 2013-12-14 ENCOUNTER — Other Ambulatory Visit: Payer: Self-pay | Admitting: *Deleted

## 2013-12-14 ENCOUNTER — Ambulatory Visit: Payer: No Typology Code available for payment source

## 2013-12-14 ENCOUNTER — Other Ambulatory Visit: Payer: No Typology Code available for payment source

## 2013-12-14 ENCOUNTER — Other Ambulatory Visit: Payer: Self-pay

## 2013-12-14 ENCOUNTER — Ambulatory Visit (HOSPITAL_BASED_OUTPATIENT_CLINIC_OR_DEPARTMENT_OTHER): Payer: No Typology Code available for payment source

## 2013-12-14 VITALS — BP 131/74 | HR 81 | Temp 98.0°F

## 2013-12-14 DIAGNOSIS — I82401 Acute embolism and thrombosis of unspecified deep veins of right lower extremity: Secondary | ICD-10-CM

## 2013-12-14 DIAGNOSIS — C541 Malignant neoplasm of endometrium: Secondary | ICD-10-CM

## 2013-12-14 DIAGNOSIS — Z5189 Encounter for other specified aftercare: Secondary | ICD-10-CM

## 2013-12-14 DIAGNOSIS — C55 Malignant neoplasm of uterus, part unspecified: Secondary | ICD-10-CM

## 2013-12-14 DIAGNOSIS — C787 Secondary malignant neoplasm of liver and intrahepatic bile duct: Secondary | ICD-10-CM

## 2013-12-14 MED ORDER — ALLOPURINOL 100 MG PO TABS: 100.0000 mg | ORAL_TABLET | Freq: Every day | ORAL | Status: DC

## 2013-12-14 MED ORDER — FERROUS FUMARATE 325 (106 FE) MG PO TABS: 1.0000 | ORAL_TABLET | Freq: Every day | ORAL | Status: DC

## 2013-12-14 MED ORDER — PEGFILGRASTIM INJECTION 6 MG/0.6ML
6.0000 mg | Freq: Once | SUBCUTANEOUS | Status: AC
  Administered 2013-12-14: 6 mg via SUBCUTANEOUS
  Filled 2013-12-14: qty 0.6

## 2013-12-14 MED ORDER — LEVOTHYROXINE SODIUM 50 MCG PO TABS: 50.0000 ug | ORAL_TABLET | Freq: Every day | ORAL | Status: DC

## 2013-12-16 ENCOUNTER — Other Ambulatory Visit: Payer: Self-pay | Admitting: Oncology

## 2013-12-16 DIAGNOSIS — C562 Malignant neoplasm of left ovary: Secondary | ICD-10-CM

## 2013-12-16 DIAGNOSIS — C541 Malignant neoplasm of endometrium: Secondary | ICD-10-CM

## 2013-12-18 ENCOUNTER — Telehealth: Payer: Self-pay | Admitting: Endocrinology

## 2013-12-18 ENCOUNTER — Telehealth: Payer: Self-pay | Admitting: *Deleted

## 2013-12-18 ENCOUNTER — Telehealth: Payer: Self-pay

## 2013-12-18 NOTE — Telephone Encounter (Signed)
Per staff message and POF I have scheduled appts. Advised scheduler of appts. JMW  

## 2013-12-18 NOTE — Telephone Encounter (Signed)
Spoke with Caitlyn Higgins and she understands that she is not to receive chemotherapy on 12-20-13 as she just received her treatment on 12-13-13.

## 2013-12-18 NOTE — Telephone Encounter (Signed)
paitent needs to call Oncology, they are the ones who called her, not [place]

## 2013-12-18 NOTE — Telephone Encounter (Signed)
Patient would like to speak with Dr. Dwyane Dee assistant regarding her results   Please call back   Thank you

## 2013-12-18 NOTE — Telephone Encounter (Signed)
Message copied by Baruch Merl on Mon Dec 18, 2013  7:15 PM ------      Message from: Gordy Levan      Created: Sat Dec 16, 2013  3:37 PM       NOT due for rx on 9-30 as scheduled - I believe last Rx was delayed. I have put in POF and messaged Sharyn Lull to cancel , but please make sure patient knows no treatment on 9-30. I will still see her/lab/coumadin that day            Thanks      Cc LA, KF ------

## 2013-12-20 ENCOUNTER — Encounter: Payer: Self-pay | Admitting: Oncology

## 2013-12-20 ENCOUNTER — Telehealth: Payer: Self-pay | Admitting: *Deleted

## 2013-12-20 ENCOUNTER — Ambulatory Visit: Payer: No Typology Code available for payment source

## 2013-12-20 ENCOUNTER — Ambulatory Visit (HOSPITAL_BASED_OUTPATIENT_CLINIC_OR_DEPARTMENT_OTHER): Payer: Self-pay | Admitting: Pharmacist

## 2013-12-20 ENCOUNTER — Other Ambulatory Visit (HOSPITAL_BASED_OUTPATIENT_CLINIC_OR_DEPARTMENT_OTHER): Payer: No Typology Code available for payment source

## 2013-12-20 ENCOUNTER — Ambulatory Visit (HOSPITAL_BASED_OUTPATIENT_CLINIC_OR_DEPARTMENT_OTHER): Payer: No Typology Code available for payment source | Admitting: Oncology

## 2013-12-20 ENCOUNTER — Telehealth: Payer: Self-pay | Admitting: Oncology

## 2013-12-20 VITALS — BP 124/69 | HR 63 | Temp 98.4°F | Resp 18 | Ht 67.0 in | Wt 227.9 lb

## 2013-12-20 DIAGNOSIS — C562 Malignant neoplasm of left ovary: Secondary | ICD-10-CM

## 2013-12-20 DIAGNOSIS — C55 Malignant neoplasm of uterus, part unspecified: Secondary | ICD-10-CM

## 2013-12-20 DIAGNOSIS — C549 Malignant neoplasm of corpus uteri, unspecified: Secondary | ICD-10-CM

## 2013-12-20 DIAGNOSIS — Z86718 Personal history of other venous thrombosis and embolism: Secondary | ICD-10-CM

## 2013-12-20 DIAGNOSIS — I824Y9 Acute embolism and thrombosis of unspecified deep veins of unspecified proximal lower extremity: Secondary | ICD-10-CM

## 2013-12-20 DIAGNOSIS — C787 Secondary malignant neoplasm of liver and intrahepatic bile duct: Secondary | ICD-10-CM

## 2013-12-20 DIAGNOSIS — C541 Malignant neoplasm of endometrium: Secondary | ICD-10-CM

## 2013-12-20 DIAGNOSIS — Z95828 Presence of other vascular implants and grafts: Secondary | ICD-10-CM

## 2013-12-20 DIAGNOSIS — I2699 Other pulmonary embolism without acute cor pulmonale: Secondary | ICD-10-CM

## 2013-12-20 DIAGNOSIS — C779 Secondary and unspecified malignant neoplasm of lymph node, unspecified: Secondary | ICD-10-CM

## 2013-12-20 DIAGNOSIS — C569 Malignant neoplasm of unspecified ovary: Secondary | ICD-10-CM

## 2013-12-20 DIAGNOSIS — I82401 Acute embolism and thrombosis of unspecified deep veins of right lower extremity: Secondary | ICD-10-CM

## 2013-12-20 DIAGNOSIS — C50919 Malignant neoplasm of unspecified site of unspecified female breast: Secondary | ICD-10-CM

## 2013-12-20 DIAGNOSIS — Z7901 Long term (current) use of anticoagulants: Secondary | ICD-10-CM

## 2013-12-20 LAB — CBC WITH DIFFERENTIAL/PLATELET
BASO%: 0.6 % (ref 0.0–2.0)
Basophils Absolute: 0.1 10*3/uL (ref 0.0–0.1)
EOS ABS: 0 10*3/uL (ref 0.0–0.5)
EOS%: 0.1 % (ref 0.0–7.0)
HEMATOCRIT: 26.3 % — AB (ref 34.8–46.6)
HGB: 8.5 g/dL — ABNORMAL LOW (ref 11.6–15.9)
LYMPH%: 7.4 % — ABNORMAL LOW (ref 14.0–49.7)
MCH: 32.5 pg (ref 25.1–34.0)
MCHC: 32.3 g/dL (ref 31.5–36.0)
MCV: 100.6 fL (ref 79.5–101.0)
MONO#: 1.4 10*3/uL — AB (ref 0.1–0.9)
MONO%: 10.2 % (ref 0.0–14.0)
NEUT#: 11 10*3/uL — ABNORMAL HIGH (ref 1.5–6.5)
NEUT%: 81.7 % — ABNORMAL HIGH (ref 38.4–76.8)
Platelets: 137 10*3/uL — ABNORMAL LOW (ref 145–400)
RBC: 2.61 10*6/uL — AB (ref 3.70–5.45)
RDW: 16.8 % — ABNORMAL HIGH (ref 11.2–14.5)
WBC: 13.4 10*3/uL — ABNORMAL HIGH (ref 3.9–10.3)
lymph#: 1 10*3/uL (ref 0.9–3.3)

## 2013-12-20 LAB — COMPREHENSIVE METABOLIC PANEL (CC13)
ALK PHOS: 110 U/L (ref 40–150)
ALT: 12 U/L (ref 0–55)
ANION GAP: 7 meq/L (ref 3–11)
AST: 29 U/L (ref 5–34)
Albumin: 3.7 g/dL (ref 3.5–5.0)
BUN: 18.9 mg/dL (ref 7.0–26.0)
CO2: 25 meq/L (ref 22–29)
CREATININE: 1 mg/dL (ref 0.6–1.1)
Calcium: 9.1 mg/dL (ref 8.4–10.4)
Chloride: 106 mEq/L (ref 98–109)
GLUCOSE: 89 mg/dL (ref 70–140)
Potassium: 4.3 mEq/L (ref 3.5–5.1)
SODIUM: 139 meq/L (ref 136–145)
TOTAL PROTEIN: 8.6 g/dL — AB (ref 6.4–8.3)
Total Bilirubin: 0.27 mg/dL (ref 0.20–1.20)

## 2013-12-20 LAB — PROTIME-INR
INR: 1.4 — ABNORMAL LOW (ref 2.00–3.50)
Protime: 16.8 s — ABNORMAL HIGH (ref 10.6–13.4)

## 2013-12-20 LAB — POCT INR: INR: 1.4

## 2013-12-20 MED ORDER — HEPARIN SOD (PORK) LOCK FLUSH 100 UNIT/ML IV SOLN
500.0000 [IU] | Freq: Once | INTRAVENOUS | Status: AC
  Administered 2013-12-20: 500 [IU] via INTRAVENOUS
  Filled 2013-12-20: qty 5

## 2013-12-20 MED ORDER — SODIUM CHLORIDE 0.9 % IJ SOLN
10.0000 mL | INTRAMUSCULAR | Status: DC | PRN
  Administered 2013-12-20: 10 mL via INTRAVENOUS
  Filled 2013-12-20: qty 10

## 2013-12-20 NOTE — Telephone Encounter (Signed)
Per staff message and POF I have scheduled appts. Advised scheduler of appts. JMW  

## 2013-12-20 NOTE — Patient Instructions (Signed)

## 2013-12-20 NOTE — Telephone Encounter (Signed)
per pof to sch pt appt-sch pt appt-gave pt copy of sch

## 2013-12-20 NOTE — Patient Instructions (Signed)
Continue Coumadin 10 mg daily.   Recheck INR in 1 week during infusion on 12/28/13 Lab at 9:45am, Infusion to follow and we will see you during your infusion.
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
## 2013-12-20 NOTE — Progress Notes (Signed)
Patient seen in clinic today after her MD appmt with Dr. Marko Plume INR=1.4 on 10 mg daily Pt has not changes to report Diet consistent No missed doses Taking APAP sporadically States MD mentioned coming off coumadin but she would rather wait until after her scans in a month. Pt states she has been on it since December Keeping dose at 10 mg daily as it appears she has got to her goal INR at about day 10 when she previously held her coumadin She will RTC in 1 week and we will see her during her infusion on 12/28/13

## 2013-12-28 ENCOUNTER — Other Ambulatory Visit: Payer: No Typology Code available for payment source

## 2013-12-28 ENCOUNTER — Ambulatory Visit: Payer: No Typology Code available for payment source

## 2013-12-28 ENCOUNTER — Other Ambulatory Visit: Payer: Self-pay

## 2013-12-28 ENCOUNTER — Other Ambulatory Visit: Payer: Self-pay | Admitting: Oncology

## 2013-12-28 ENCOUNTER — Ambulatory Visit (HOSPITAL_BASED_OUTPATIENT_CLINIC_OR_DEPARTMENT_OTHER): Payer: No Typology Code available for payment source

## 2013-12-28 ENCOUNTER — Ambulatory Visit (HOSPITAL_BASED_OUTPATIENT_CLINIC_OR_DEPARTMENT_OTHER): Payer: Self-pay | Admitting: Pharmacist

## 2013-12-28 VITALS — BP 119/80 | HR 96 | Temp 98.0°F | Resp 20

## 2013-12-28 DIAGNOSIS — I824Y9 Acute embolism and thrombosis of unspecified deep veins of unspecified proximal lower extremity: Secondary | ICD-10-CM

## 2013-12-28 DIAGNOSIS — C541 Malignant neoplasm of endometrium: Secondary | ICD-10-CM

## 2013-12-28 DIAGNOSIS — Z95828 Presence of other vascular implants and grafts: Secondary | ICD-10-CM

## 2013-12-28 DIAGNOSIS — I82401 Acute embolism and thrombosis of unspecified deep veins of right lower extremity: Secondary | ICD-10-CM

## 2013-12-28 DIAGNOSIS — Z5111 Encounter for antineoplastic chemotherapy: Secondary | ICD-10-CM

## 2013-12-28 LAB — CBC WITH DIFFERENTIAL/PLATELET
BASO%: 0 % (ref 0.0–2.0)
Basophils Absolute: 0 10*3/uL (ref 0.0–0.1)
EOS%: 0 % (ref 0.0–7.0)
Eosinophils Absolute: 0 10*3/uL (ref 0.0–0.5)
HCT: 30.2 % — ABNORMAL LOW (ref 34.8–46.6)
HGB: 9.9 g/dL — ABNORMAL LOW (ref 11.6–15.9)
LYMPH#: 0.2 10*3/uL — AB (ref 0.9–3.3)
LYMPH%: 5.5 % — ABNORMAL LOW (ref 14.0–49.7)
MCH: 32.9 pg (ref 25.1–34.0)
MCHC: 32.9 g/dL (ref 31.5–36.0)
MCV: 100 fL (ref 79.5–101.0)
MONO#: 0 10*3/uL — ABNORMAL LOW (ref 0.1–0.9)
MONO%: 1.2 % (ref 0.0–14.0)
NEUT#: 3.9 10*3/uL (ref 1.5–6.5)
NEUT%: 93.3 % — AB (ref 38.4–76.8)
Platelets: 142 10*3/uL — ABNORMAL LOW (ref 145–400)
RBC: 3.02 10*6/uL — ABNORMAL LOW (ref 3.70–5.45)
RDW: 16.3 % — AB (ref 11.2–14.5)
WBC: 4.2 10*3/uL (ref 3.9–10.3)

## 2013-12-28 LAB — POCT INR: INR: 1.7

## 2013-12-28 LAB — PROTIME-INR
INR: 1.7 — AB (ref 2.00–3.50)
Protime: 20.4 Seconds — ABNORMAL HIGH (ref 10.6–13.4)

## 2013-12-28 MED ORDER — CARBOPLATIN CHEMO INJECTION 600 MG/60ML
546.0000 mg | Freq: Once | INTRAVENOUS | Status: AC
  Administered 2013-12-28: 550 mg via INTRAVENOUS
  Filled 2013-12-28: qty 55

## 2013-12-28 MED ORDER — FAMOTIDINE IN NACL 20-0.9 MG/50ML-% IV SOLN
20.0000 mg | Freq: Once | INTRAVENOUS | Status: AC
  Administered 2013-12-28: 20 mg via INTRAVENOUS

## 2013-12-28 MED ORDER — ONDANSETRON 16 MG/50ML IVPB (CHCC)
16.0000 mg | Freq: Once | INTRAVENOUS | Status: AC
  Administered 2013-12-28: 16 mg via INTRAVENOUS

## 2013-12-28 MED ORDER — PACLITAXEL CHEMO INJECTION 300 MG/50ML
175.0000 mg/m2 | Freq: Once | INTRAVENOUS | Status: AC
  Administered 2013-12-28: 342 mg via INTRAVENOUS
  Filled 2013-12-28: qty 57

## 2013-12-28 MED ORDER — CARBOPLATIN CHEMO INTRADERMAL TEST DOSE 100MCG/0.02ML
100.0000 ug | Freq: Once | INTRADERMAL | Status: AC
  Administered 2013-12-28: 100 ug via INTRADERMAL
  Filled 2013-12-28: qty 0.01

## 2013-12-28 MED ORDER — DIPHENHYDRAMINE HCL 50 MG/ML IJ SOLN
50.0000 mg | Freq: Once | INTRAMUSCULAR | Status: AC
  Administered 2013-12-28: 50 mg via INTRAVENOUS

## 2013-12-28 MED ORDER — SODIUM CHLORIDE 0.9 % IJ SOLN
10.0000 mL | INTRAMUSCULAR | Status: DC | PRN
  Administered 2013-12-28: 10 mL via INTRAVENOUS
  Filled 2013-12-28: qty 10

## 2013-12-28 MED ORDER — HEPARIN SOD (PORK) LOCK FLUSH 100 UNIT/ML IV SOLN
500.0000 [IU] | Freq: Once | INTRAVENOUS | Status: AC | PRN
  Administered 2013-12-28: 500 [IU]
  Filled 2013-12-28: qty 5

## 2013-12-28 MED ORDER — SODIUM CHLORIDE 0.9 % IV SOLN
Freq: Once | INTRAVENOUS | Status: AC
  Administered 2013-12-28: 13:00:00 via INTRAVENOUS

## 2013-12-28 MED ORDER — SODIUM CHLORIDE 0.9 % IJ SOLN
10.0000 mL | INTRAMUSCULAR | Status: DC | PRN
  Administered 2013-12-28: 10 mL
  Filled 2013-12-28: qty 10

## 2013-12-28 MED ORDER — DEXAMETHASONE SODIUM PHOSPHATE 20 MG/5ML IJ SOLN
20.0000 mg | Freq: Once | INTRAMUSCULAR | Status: AC
  Administered 2013-12-28: 20 mg via INTRAVENOUS

## 2013-12-28 NOTE — Progress Notes (Signed)
INR = 1.7 on coumadin 10 mg daily Has not missed any doses of Coumadin. No s/sxs of VTE. Her hands have been cramping. INR low again.  Will increase Coumadin this week to 10 mg/day except 12.5 mg (she will use 10 mg x 1 tab & 5 mg x 1/2 tab) Th/Sat. Recheck INR next week when she is back for her Neulasta inj. Samples: Coumadin 10 mg x 30 tabs (lot #[id number], exp 01/2015) [name], Pharm.D., CPP 12/28/2013@1 :27 PM

## 2013-12-28 NOTE — Patient Instructions (Signed)

## 2013-12-28 NOTE — Patient Instructions (Addendum)
[facility] Discharge Instructions for Patients Receiving Chemotherapy  Today you received the following chemotherapy agents Paclitaxel/Carboplatin.   To help prevent nausea and vomiting after your treatment, we encourage you to take your nausea medication as directed.    If you develop nausea and vomiting that is not controlled by your nausea medication, call the clinic.   BELOW ARE SYMPTOMS THAT SHOULD BE REPORTED IMMEDIATELY:  *FEVER GREATER THAN 100.5 F  *CHILLS WITH OR WITHOUT FEVER  NAUSEA AND VOMITING THAT IS NOT CONTROLLED WITH YOUR NAUSEA MEDICATION  *UNUSUAL SHORTNESS OF BREATH  *UNUSUAL BRUISING OR BLEEDING  TENDERNESS IN MOUTH AND THROAT WITH OR WITHOUT PRESENCE OF ULCERS  *URINARY PROBLEMS  *BOWEL PROBLEMS  UNUSUAL RASH Items with * indicate a potential emergency and should be followed up as soon as possible.  Feel free to call the clinic you have any questions or concerns. The clinic phone number is [phone].   CONGRATULATIONS!!!! YOU DID IT!!! WOOHOOO!!!!!!

## 2014-01-01 ENCOUNTER — Ambulatory Visit: Payer: No Typology Code available for payment source | Admitting: Gynecologic Oncology

## 2014-01-03 ENCOUNTER — Telehealth: Payer: Self-pay | Admitting: Oncology

## 2014-01-03 NOTE — Telephone Encounter (Signed)
per Theadora Rama to add lab &cc-pt aware

## 2014-01-04 ENCOUNTER — Ambulatory Visit (HOSPITAL_BASED_OUTPATIENT_CLINIC_OR_DEPARTMENT_OTHER): Payer: No Typology Code available for payment source

## 2014-01-04 ENCOUNTER — Ambulatory Visit: Payer: No Typology Code available for payment source

## 2014-01-04 ENCOUNTER — Other Ambulatory Visit: Payer: No Typology Code available for payment source

## 2014-01-04 ENCOUNTER — Ambulatory Visit (HOSPITAL_BASED_OUTPATIENT_CLINIC_OR_DEPARTMENT_OTHER): Payer: Self-pay | Admitting: Pharmacist

## 2014-01-04 ENCOUNTER — Telehealth: Payer: Self-pay | Admitting: Oncology

## 2014-01-04 VITALS — BP 125/66 | HR 92 | Temp 97.8°F

## 2014-01-04 DIAGNOSIS — I82401 Acute embolism and thrombosis of unspecified deep veins of right lower extremity: Secondary | ICD-10-CM

## 2014-01-04 DIAGNOSIS — C541 Malignant neoplasm of endometrium: Secondary | ICD-10-CM

## 2014-01-04 DIAGNOSIS — Z5189 Encounter for other specified aftercare: Secondary | ICD-10-CM

## 2014-01-04 DIAGNOSIS — I824Y9 Acute embolism and thrombosis of unspecified deep veins of unspecified proximal lower extremity: Secondary | ICD-10-CM

## 2014-01-04 LAB — PROTIME-INR
INR: 1.8 — ABNORMAL LOW (ref 2.00–3.50)
Protime: 21.6 Seconds — ABNORMAL HIGH (ref 10.6–13.4)

## 2014-01-04 LAB — CBC WITH DIFFERENTIAL/PLATELET
BASO%: 0.5 % (ref 0.0–2.0)
BASOS ABS: 0 10*3/uL (ref 0.0–0.1)
EOS%: 0 % (ref 0.0–7.0)
Eosinophils Absolute: 0 10*3/uL (ref 0.0–0.5)
HCT: 27.7 % — ABNORMAL LOW (ref 34.8–46.6)
HEMOGLOBIN: 9.2 g/dL — AB (ref 11.6–15.9)
LYMPH#: 0.7 10*3/uL — AB (ref 0.9–3.3)
LYMPH%: 36.1 % (ref 14.0–49.7)
MCH: 32.5 pg (ref 25.1–34.0)
MCHC: 33.2 g/dL (ref 31.5–36.0)
MCV: 97.9 fL (ref 79.5–101.0)
MONO#: 0.1 10*3/uL (ref 0.1–0.9)
MONO%: 5 % (ref 0.0–14.0)
NEUT#: 1.2 10*3/uL — ABNORMAL LOW (ref 1.5–6.5)
NEUT%: 58.4 % (ref 38.4–76.8)
Platelets: 107 10*3/uL — ABNORMAL LOW (ref 145–400)
RBC: 2.83 10*6/uL — ABNORMAL LOW (ref 3.70–5.45)
RDW: 15.1 % — ABNORMAL HIGH (ref 11.2–14.5)
WBC: 2 10*3/uL — AB (ref 3.9–10.3)
nRBC: 0 % (ref 0–0)

## 2014-01-04 LAB — POCT INR: INR: 1.8

## 2014-01-04 MED ORDER — PEGFILGRASTIM INJECTION 6 MG/0.6ML
6.0000 mg | Freq: Once | SUBCUTANEOUS | Status: AC
  Administered 2014-01-04: 6 mg via SUBCUTANEOUS
  Filled 2014-01-04: qty 0.6

## 2014-01-04 NOTE — Progress Notes (Signed)
INR = 1.8    Goal 2-3 INR below goal. No complications of anticoagulation noted. Patient states she got "light headed" at home and fell on Sunday following chemo. She felt like she was having a hot flash; she has experienced this before shortly after chemo. She has felt well since. Will increase Coumadin slightly to try and get back in goal range. She will take Coumadin 10 mg daily except 12.5 mg on Thurs, Fri, Sat.  We will recheck INR in 6 days on 01/10/14: Lab at 8:30am, Coumadin clinic at 8:45 am, and Dr. Marko Plume at 9 am.  Theone Murdoch, PharmD

## 2014-01-04 NOTE — Patient Instructions (Signed)
Pegfilgrastim injection What is this medicine? PEGFILGRASTIM (peg fil GRA stim) is a long-acting granulocyte colony-stimulating factor that stimulates the growth of neutrophils, a type of white blood cell important in the body's fight against infection. It is used to reduce the incidence of fever and infection in patients with certain types of cancer who are receiving chemotherapy that affects the bone marrow. This medicine may be used for other purposes; ask your health care provider or pharmacist if you have questions. COMMON BRAND NAME(S): Neulasta What should I tell my health care provider before I take this medicine? They need to know if you have any of these conditions: -latex allergy -ongoing radiation therapy -sickle cell disease -skin reactions to acrylic adhesives (On-Body Injector only) -an unusual or allergic reaction to pegfilgrastim, filgrastim, other medicines, foods, dyes, or preservatives -pregnant or trying to get pregnant -breast-feeding How should I use this medicine? This medicine is for injection under the skin. If you get this medicine at home, you will be taught how to prepare and give the pre-filled syringe or how to use the On-body Injector. Refer to the patient Instructions for Use for detailed instructions. Use exactly as directed. Take your medicine at regular intervals. Do not take your medicine more often than directed. It is important that you put your used needles and syringes in a special sharps container. Do not put them in a trash can. If you do not have a sharps container, call your pharmacist or healthcare provider to get one. Talk to your pediatrician regarding the use of this medicine in children. Special care may be needed. Overdosage: If you think you have taken too much of this medicine contact a poison control center or emergency room at once. NOTE: This medicine is only for you. Do not share this medicine with others. What if I miss a dose? It is  important not to miss your dose. Call your doctor or health care professional if you miss your dose. If you miss a dose due to an On-body Injector failure or leakage, a new dose should be administered as soon as possible using a single prefilled syringe for manual use. What may interact with this medicine? Interactions have not been studied. Give your health care provider a list of all the medicines, herbs, non-prescription drugs, or dietary supplements you use. Also tell them if you smoke, drink alcohol, or use illegal drugs. Some items may interact with your medicine. This list may not describe all possible interactions. Give your health care provider a list of all the medicines, herbs, non-prescription drugs, or dietary supplements you use. Also tell them if you smoke, drink alcohol, or use illegal drugs. Some items may interact with your medicine. What should I watch for while using this medicine? You may need blood work done while you are taking this medicine. If you are going to need a MRI, CT scan, or other procedure, tell your doctor that you are using this medicine (On-Body Injector only). What side effects may I notice from receiving this medicine? Side effects that you should report to your doctor or health care professional as soon as possible: -allergic reactions like skin rash, itching or hives, swelling of the face, lips, or tongue -dizziness -fever -pain, redness, or irritation at site where injected -pinpoint red spots on the skin -shortness of breath or breathing problems -stomach or side pain, or pain at the shoulder -swelling -tiredness -trouble passing urine Side effects that usually do not require medical attention (report to your doctor   or health care professional if they continue or are bothersome): -bone pain -muscle pain This list may not describe all possible side effects. Call your doctor for medical advice about side effects. You may report side effects to FDA at  1-800-FDA-1088. Where should I keep my medicine? Keep out of the reach of children. Store pre-filled syringes in a refrigerator between 2 and 8 degrees C (36 and 46 degrees F). Do not freeze. Keep in carton to protect from light. Throw away this medicine if it is left out of the refrigerator for more than 48 hours. Throw away any unused medicine after the expiration date. NOTE: This sheet is a summary. It may not cover all possible information. If you have questions about this medicine, talk to your doctor, pharmacist, or health care provider.  2015, [employer]vier/Gold Standard. (2013-06-08 16:14:05)  

## 2014-01-04 NOTE — Telephone Encounter (Signed)
, °

## 2014-01-09 ENCOUNTER — Other Ambulatory Visit: Payer: Self-pay | Admitting: Oncology

## 2014-01-09 DIAGNOSIS — C562 Malignant neoplasm of left ovary: Secondary | ICD-10-CM

## 2014-01-10 ENCOUNTER — Ambulatory Visit (HOSPITAL_BASED_OUTPATIENT_CLINIC_OR_DEPARTMENT_OTHER): Payer: Self-pay

## 2014-01-10 ENCOUNTER — Other Ambulatory Visit (HOSPITAL_BASED_OUTPATIENT_CLINIC_OR_DEPARTMENT_OTHER): Payer: No Typology Code available for payment source

## 2014-01-10 ENCOUNTER — Encounter: Payer: Self-pay | Admitting: Oncology

## 2014-01-10 ENCOUNTER — Telehealth: Payer: Self-pay | Admitting: Oncology

## 2014-01-10 ENCOUNTER — Ambulatory Visit (HOSPITAL_BASED_OUTPATIENT_CLINIC_OR_DEPARTMENT_OTHER): Payer: No Typology Code available for payment source | Admitting: Oncology

## 2014-01-10 VITALS — BP 119/63 | HR 84 | Temp 97.9°F | Resp 18 | Ht 67.0 in | Wt 233.8 lb

## 2014-01-10 DIAGNOSIS — D6481 Anemia due to antineoplastic chemotherapy: Secondary | ICD-10-CM

## 2014-01-10 DIAGNOSIS — C562 Malignant neoplasm of left ovary: Secondary | ICD-10-CM

## 2014-01-10 DIAGNOSIS — I82492 Acute embolism and thrombosis of other specified deep vein of left lower extremity: Secondary | ICD-10-CM

## 2014-01-10 DIAGNOSIS — C55 Malignant neoplasm of uterus, part unspecified: Secondary | ICD-10-CM

## 2014-01-10 DIAGNOSIS — I82401 Acute embolism and thrombosis of unspecified deep veins of right lower extremity: Secondary | ICD-10-CM

## 2014-01-10 DIAGNOSIS — C541 Malignant neoplasm of endometrium: Secondary | ICD-10-CM

## 2014-01-10 DIAGNOSIS — D6959 Other secondary thrombocytopenia: Secondary | ICD-10-CM

## 2014-01-10 DIAGNOSIS — I824Y9 Acute embolism and thrombosis of unspecified deep veins of unspecified proximal lower extremity: Secondary | ICD-10-CM

## 2014-01-10 DIAGNOSIS — C787 Secondary malignant neoplasm of liver and intrahepatic bile duct: Secondary | ICD-10-CM

## 2014-01-10 LAB — CBC WITH DIFFERENTIAL/PLATELET
BASO%: 0.1 % (ref 0.0–2.0)
Basophils Absolute: 0 10*3/uL (ref 0.0–0.1)
EOS%: 0 % (ref 0.0–7.0)
Eosinophils Absolute: 0 10*3/uL (ref 0.0–0.5)
HEMATOCRIT: 25.3 % — AB (ref 34.8–46.6)
HEMOGLOBIN: 8.3 g/dL — AB (ref 11.6–15.9)
LYMPH%: 9.6 % — AB (ref 14.0–49.7)
MCH: 33 pg (ref 25.1–34.0)
MCHC: 32.6 g/dL (ref 31.5–36.0)
MCV: 101.4 fL — ABNORMAL HIGH (ref 79.5–101.0)
MONO#: 1.2 10*3/uL — ABNORMAL HIGH (ref 0.1–0.9)
MONO%: 12.1 % (ref 0.0–14.0)
NEUT#: 7.7 10*3/uL — ABNORMAL HIGH (ref 1.5–6.5)
NEUT%: 78.2 % — AB (ref 38.4–76.8)
PLATELETS: 84 10*3/uL — AB (ref 145–400)
RBC: 2.5 10*6/uL — ABNORMAL LOW (ref 3.70–5.45)
RDW: 16.8 % — ABNORMAL HIGH (ref 11.2–14.5)
WBC: 9.8 10*3/uL (ref 3.9–10.3)
lymph#: 0.9 10*3/uL (ref 0.9–3.3)

## 2014-01-10 LAB — COMPREHENSIVE METABOLIC PANEL (CC13)
ALT: 17 U/L (ref 0–55)
ANION GAP: 8 meq/L (ref 3–11)
AST: 23 U/L (ref 5–34)
Albumin: 3.6 g/dL (ref 3.5–5.0)
Alkaline Phosphatase: 101 U/L (ref 40–150)
BILIRUBIN TOTAL: 0.23 mg/dL (ref 0.20–1.20)
BUN: 17.2 mg/dL (ref 7.0–26.0)
CHLORIDE: 107 meq/L (ref 98–109)
CO2: 27 mEq/L (ref 22–29)
CREATININE: 1 mg/dL (ref 0.6–1.1)
Calcium: 8.9 mg/dL (ref 8.4–10.4)
Glucose: 88 mg/dl (ref 70–140)
Potassium: 3.5 mEq/L (ref 3.5–5.1)
Sodium: 142 mEq/L (ref 136–145)
Total Protein: 8.2 g/dL (ref 6.4–8.3)

## 2014-01-10 LAB — PROTIME-INR
INR: 3.1 (ref 2.00–3.50)
Protime: 37.2 Seconds — ABNORMAL HIGH (ref 10.6–13.4)

## 2014-01-10 LAB — POCT INR: INR: 3.1

## 2014-01-10 MED ORDER — OMEPRAZOLE 40 MG PO CPDR: 40.0000 mg | DELAYED_RELEASE_CAPSULE | Freq: Every day | ORAL | Status: DC

## 2014-01-10 MED ORDER — OXYCODONE HCL 10 MG PO TABS: ORAL_TABLET | ORAL | Status: DC

## 2014-01-10 MED ORDER — OXYCODONE HCL ER 15 MG PO T12A: 15.0000 mg | EXTENDED_RELEASE_TABLET | Freq: Two times a day (BID) | ORAL | Status: DC

## 2014-01-10 NOTE — Progress Notes (Signed)
[name] was seen today by Dr. Marko Plume who addressed her Coumadin therapy. Patient did not see Coumadin Clinic pharmacist. Please see Dr. Mariana Kaufman progress note from her office visit for more details of the encounter.  Per Dr. Mariana Kaufman recommendations: Decrease coumadin to 7.5 mg (= 1 + 1/2 of the 5 mg tablets) today 10-21 and tomorrow 10-22, then back to 10 mg daily. Return to Coumadin Clinic on 11/5 (same day as Dr Leone Brand appt): lab 11:15 am and CC at 11:30 am

## 2014-01-10 NOTE — Patient Instructions (Signed)
Decrease coumadin to 7.5 mg (= 1 + 1/2 of the 5 mg tablets) today 10-21 and tomorrow 10-22, then back to 10 mg daily. The coumadin dose is decreased as your platelets are a little low today. Coumadin Clinic will see you on 11-5, same day as Dr Leone Brand apt.
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
## 2014-01-11 LAB — CA 125(PREVIOUS METHOD): CA 125: 5.1 U/mL (ref 0.0–30.2)

## 2014-01-11 LAB — CA 125: CA 125: 6 U/mL (ref <35)

## 2014-01-12 ENCOUNTER — Telehealth: Payer: Self-pay

## 2014-01-12 NOTE — Telephone Encounter (Signed)
Message copied by Baruch Merl on Fri Jan 12, 2014  6:01 PM ------      Message from: Evlyn Clines P      Created: Thu Jan 11, 2014 10:34 AM       I thought patient told me she is on oxycontin 10 mg q 12, but I see med list has 15 mg q 12. Please check with pharmacy to see what her last prior script was.      I think we can decrease and hopefully DC oxycontin when next refill due. If she is not having abdominal pain then, would decrease oxycontin to 10 mg bid x 1 week then DC, with prn oxycodone still available at least initially then.      thanks ------

## 2014-01-12 NOTE — Telephone Encounter (Signed)
Spoke with [facility] pharmacy pharmacist to clarify narcotic prescriptions  Caitlyn Higgins's OxyContin previous prescriptions were for 15 mg tabs q 12 hrs.  The Oxycodone is 10 mg tablets.  NEXT narcotic refill follow Dr. Mariana Kaufman plan as noted below if Caitlyn Higgins is not having abdominal pain.

## 2014-01-22 ENCOUNTER — Encounter (HOSPITAL_COMMUNITY): Payer: Self-pay

## 2014-01-22 ENCOUNTER — Ambulatory Visit (HOSPITAL_COMMUNITY)
Admission: RE | Admit: 2014-01-22 | Discharge: 2014-01-22 | Disposition: A | Discharge status: Home / Self Care | Payer: No Typology Code available for payment source | Source: Ambulatory Visit | Attending: Diagnostic Radiology | Admitting: Diagnostic Radiology

## 2014-01-22 DIAGNOSIS — Z90710 Acquired absence of both cervix and uterus: Secondary | ICD-10-CM | POA: Insufficient documentation

## 2014-01-22 DIAGNOSIS — C562 Malignant neoplasm of left ovary: Secondary | ICD-10-CM | POA: Diagnosis not present

## 2014-01-22 DIAGNOSIS — C541 Malignant neoplasm of endometrium: Secondary | ICD-10-CM | POA: Diagnosis not present

## 2014-01-22 DIAGNOSIS — Z90721 Acquired absence of ovaries, unilateral: Secondary | ICD-10-CM | POA: Diagnosis not present

## 2014-01-22 DIAGNOSIS — Z9221 Personal history of antineoplastic chemotherapy: Secondary | ICD-10-CM | POA: Insufficient documentation

## 2014-01-22 MED ORDER — IOHEXOL 300 MG/ML  SOLN
100.0000 mL | Freq: Once | INTRAMUSCULAR | Status: AC | PRN
  Administered 2014-01-22: 100 mL via INTRAVENOUS

## 2014-01-23 ENCOUNTER — Telehealth: Payer: Self-pay | Admitting: Oncology

## 2014-01-23 NOTE — Telephone Encounter (Signed)
returned pt call and confirmed all appts

## 2014-01-24 ENCOUNTER — Telehealth: Payer: Self-pay | Admitting: Gynecologic Oncology

## 2014-01-24 NOTE — Telephone Encounter (Signed)
Office Visit:  GYN ONCOLOGY  Chief Complaint:  Uterine carcinosarcoma, ovarian clear cell cancer  Assessment/Plan  [name] is a 48 y.o. 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  Physical examination was notable for cervical involvement.   She was S/P IVC filter. [name] received 4 cycles of chemotherapy with resolution of the cervical lesion and no further bleeding.  Final pathology c/w two primary malignancies.  Predominantly clear cell cancer arising in the left ovarian endometriotic cyst and primary uterine carcinosarcoma.    thyroid nodule. Referral to endocrinology for assessment of the thyroid nodule Mammogram scheduled  History of Present Illness  Caitlyn Higgins is a 48 y.o. who presented with a  pelvic mass, to [facility] and was transferred to [facility] 02/23/2013.   The patient feels that she was in her usual state of health until a few days prior to presentation when she noted abdominal distention and bloating. She presented to her gastroenterologist and was sent for a CT scan.  The CT 02/22/2013 demonstrated " 1. 24.1 x 16.8 x 19.9 cm complex cystic mass which appears to arise from the pelvis extending into the mid upper abdomen is highly suspicious for an ovarian neoplasm. There is evidence of early omental disease, as well as a serosal implant upon the surface of the liver, with small volume of probable malignant ascites, and potential transdiaphragmatic extension based on small lymph nodes immediately above the diaphragm.  2. Importantly, this pelvic mass is causing severe compression of the proximal inferior vena cava, and there is a large volume of deep venous thrombosis in the visualized portion of the right thigh involving right superficial femoral, profunda femoral and  common femoral veins. Additionally, there is a mass like peripheral wedge-shaped opacity in the right lower lobe. In the setting of deep venous thrombosis, this wedge-shaped opacity would be most concerning for potential pulmonary infarction. Associated with this, there is a trace right pleural effusion. 3. 5.2 x 1.8 cm heterogeneously enhancing lesion  associated with the liver capsule overlying the right lobe of the  liver "  She was transferred to [facility].    IVC filter was placed and she was started on Lovenox.  She was subsequently transitioned to warfarin.   In December 2014 an endometrial biopsy was collected. Pathology :  1. Cervix, biopsy - HIGH GRADE ENDOMETRIAL CARCINOMA, SEE COMMENT. 2. Endometrium, biopsy - HIGH GRADE ENDOMETRIAL CARCINOMA, SEE COMMENT. 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  [name]  received the 4th cycle of Taxol carboplatin therapy on 05/24/2013. She underwent interval debulking TAH BSO omentectomy 08/01/2013  Specimen(s): Left ovary and fallopian tube.  Left salpingo-oophorectomy Primary tumor site (including laterality): Left ovary Ovarian surface involvement: Present. Ovarian capsule intact without fragmentation: No. Maximum tumor size (cm): 20 cm, gross measurement. Histologic type: Mixed surface epithelial carcinoma with predominant clear cell carcinoma component (70%) and minor endometrioid component (30%) Please see comment for detail. Grade: High grade  TNM code: ypT1c2, pNX FIGO Stage (based on pathologic findings, needs clinical correlation): at least IC2 Comments: Received grossly is a 20 cm focally disrupted and partially collapsed multicystic ovary with attached/adherent omentum. Microscopically, sectioned show an invasive high grade  surface carcinoma arising in a  background of endometriotic cyst. The tumor is predominately composed of clear cell carcinoma component with minor endometrioid carcinoma component. No tumor involvement is identified in the adherent omentum tissue. Immunohistochemical stains were performed and the clear cell carcinoma is patchy positive for p16, ER, p53, negative for WT-1 and p63. Focal angiolymphatic invasion is present.   The morphologic features are different from those seen in the uterus.  2. ONCOLOGY TABLE-UTERUS, CARCINOSARCOMA Specimen: Uterus, cervix and right fallopian tubes and ovaries. Procedure: Total hysterectomy and right salpingo-oophorectomy. Lymph node sampling performed: No. Specimen integrity: Intact. Maximum tumor size: Multiple microscopic foci, measuring up to 0.5 cm in greatest dimension. Histologic type: Carcinosarcoma (MMMT) with heterologous component (ossification) Grade: High grade Myometrial invasion: 2.1 cm where myometrium is 2.5 cm in thickness Cervical stromal involvement: No. Extent of involvement of other organs: No. Lymph - vascular invasion: Not identified.  FIGO Stage (based on pathologic findings, needs clinical correlation): IB Comment: Immunohistochemical stains were performed and the residual carcinoma is positive for p16, negative for WT-1, weakly positive for p53 and ER. The morphologic features are different from those seen in the left ovary. (HCL:gt, 08/04/13)  Completed cycle 6 of chemotherapy 09/26/2012.   She has had total of 9 cycles of carboplatin and taxol from 03-14-14 thru 01-04-14.   She has not had radiation.  CT 01/22/2014 IMPRESSION: 1. Postoperative changes of hysterectomy and left salpingo-oophorectomy without evidence of recurrent or metastatic disease. 2. Scattered lymph nodes and small omental nodules are unchanged.    Past Medical History  Diagnosis Date  . Reflux   . Hypertension   . Cancer     uterine carcinosarcoma  . DVT (deep venous  thrombosis)     right leg 12'14  . Pulmonary emboli     12'14 denies any sob  . Elevated blood uric acid level     tx. Allopurinol  . GERD (gastroesophageal reflux disease)   . Transfusion history 07-26-13    12'14, 04-14-13(2 units), 06-09-13  . Anemia   . Family history of malignant neoplasm of breast   . Ovarian cancer 2015  . Asthma     endometrial ca   Past Surgical History  Procedure Laterality Date  . No past surgeries    . Portacath placement Right     rigth chest- remains  . Ivc Right     filter placed to prevent Pulmonary emboli  . Abdominal hysterectomy N/A 08/01/2013    Procedure: EXPLORATORY LAPAROTOMY/HYSTERECTOMY TOTAL ABDOMINAL;  Surgeon: Janie Morning, MD;  Location: [facility];  Service: Gynecology;  Laterality: N/A;  . Salpingoophorectomy Bilateral 08/01/2013    Procedure: BILATERAL SALPINGO OOPHORECTOMY/OMENTECTOMY ;  Surgeon: Janie Morning, MD;  Location: [facility];  Service: Gynecology;  Laterality: Bilateral;     GYN History G0 Hx of Abnormal Pap Smears: no, last pap ~2010  Hx of STIs: no  Never had regular periods.    Family History  family history is not on file.   Review of Systems  Reports recent weight gain.  Denies cough, no hemoptysis, reports intermittent nausea she reports constipation. No vaginal bleeding, no rectal bleeding. No dysphagia.  Physical Exam  BP 133/81  Pulse 78  Temp(Src) 98.3 F (36.8 C) (Oral)  Resp 18  Ht 5' 7" (1.702 m)  Wt 210 lb 3.2 oz (95.346 kg)  BMI 32.91 kg/m2  LMP 03/09/2013 Wt Readings from Last 3 Encounters:  01/10/14 233 lb 12.8 oz (106.051 kg)  12/20/13 227 lb 14.4 oz (  103.375 kg)  11/29/13 227 lb 3.2 oz (103.057 kg)   General: No acute distress.  HEENT: Extra occular muscles grossly intact.  Pulmonary: CTAB. Normal work of breathing.  Cardiovascular: RRR.  Abdomen: Soft NT.  Midline incision well healed.  No masses or hernia.  No rebound/guarding.  Musculoskeletal: Grossly normal ROM.  Extremities: Warm  and well-perfused. 1+ pitting edema  Neurological: Alert and oriented x3.  Psychological: Appropriate mood/affect.  Genitourinary:  Nl EGBUS, Vagina without bleeding or discharge.  Cuff intact.  No cul de sac masses. Breasts:  Pendulous, no masses, no dimpling no retractions no nipple discharge. LN:  No cervical supraclavicular or inguinal adenopathy.

## 2014-01-25 ENCOUNTER — Ambulatory Visit: Payer: No Typology Code available for payment source | Attending: Gynecologic Oncology | Admitting: Gynecologic Oncology

## 2014-01-25 ENCOUNTER — Ambulatory Visit (HOSPITAL_BASED_OUTPATIENT_CLINIC_OR_DEPARTMENT_OTHER): Payer: Self-pay | Admitting: Pharmacist

## 2014-01-25 ENCOUNTER — Encounter: Payer: Self-pay | Admitting: Gynecologic Oncology

## 2014-01-25 ENCOUNTER — Other Ambulatory Visit (HOSPITAL_BASED_OUTPATIENT_CLINIC_OR_DEPARTMENT_OTHER): Payer: No Typology Code available for payment source

## 2014-01-25 VITALS — BP 130/67 | HR 76 | Temp 98.0°F | Resp 18 | Ht 67.0 in | Wt 233.4 lb

## 2014-01-25 DIAGNOSIS — C786 Secondary malignant neoplasm of retroperitoneum and peritoneum: Secondary | ICD-10-CM | POA: Insufficient documentation

## 2014-01-25 DIAGNOSIS — Z86718 Personal history of other venous thrombosis and embolism: Secondary | ICD-10-CM | POA: Diagnosis not present

## 2014-01-25 DIAGNOSIS — Z90722 Acquired absence of ovaries, bilateral: Secondary | ICD-10-CM | POA: Diagnosis not present

## 2014-01-25 DIAGNOSIS — C787 Secondary malignant neoplasm of liver and intrahepatic bile duct: Secondary | ICD-10-CM | POA: Insufficient documentation

## 2014-01-25 DIAGNOSIS — Z90710 Acquired absence of both cervix and uterus: Secondary | ICD-10-CM | POA: Insufficient documentation

## 2014-01-25 DIAGNOSIS — C569 Malignant neoplasm of unspecified ovary: Secondary | ICD-10-CM | POA: Insufficient documentation

## 2014-01-25 DIAGNOSIS — Z9079 Acquired absence of other genital organ(s): Secondary | ICD-10-CM | POA: Insufficient documentation

## 2014-01-25 DIAGNOSIS — I824Y9 Acute embolism and thrombosis of unspecified deep veins of unspecified proximal lower extremity: Secondary | ICD-10-CM

## 2014-01-25 DIAGNOSIS — I82492 Acute embolism and thrombosis of other specified deep vein of left lower extremity: Secondary | ICD-10-CM

## 2014-01-25 DIAGNOSIS — E041 Nontoxic single thyroid nodule: Secondary | ICD-10-CM | POA: Diagnosis not present

## 2014-01-25 DIAGNOSIS — Z803 Family history of malignant neoplasm of breast: Secondary | ICD-10-CM | POA: Insufficient documentation

## 2014-01-25 DIAGNOSIS — C778 Secondary and unspecified malignant neoplasm of lymph nodes of multiple regions: Secondary | ICD-10-CM | POA: Insufficient documentation

## 2014-01-25 DIAGNOSIS — Z923 Personal history of irradiation: Secondary | ICD-10-CM | POA: Diagnosis not present

## 2014-01-25 DIAGNOSIS — C541 Malignant neoplasm of endometrium: Secondary | ICD-10-CM

## 2014-01-25 DIAGNOSIS — Z86711 Personal history of pulmonary embolism: Secondary | ICD-10-CM | POA: Insufficient documentation

## 2014-01-25 DIAGNOSIS — C55 Malignant neoplasm of uterus, part unspecified: Secondary | ICD-10-CM | POA: Diagnosis present

## 2014-01-25 DIAGNOSIS — C562 Malignant neoplasm of left ovary: Secondary | ICD-10-CM

## 2014-01-25 DIAGNOSIS — I82401 Acute embolism and thrombosis of unspecified deep veins of right lower extremity: Secondary | ICD-10-CM

## 2014-01-25 LAB — CBC WITH DIFFERENTIAL/PLATELET
BASO%: 0.3 % (ref 0.0–2.0)
BASOS ABS: 0 10*3/uL (ref 0.0–0.1)
EOS%: 0.1 % (ref 0.0–7.0)
Eosinophils Absolute: 0 10*3/uL (ref 0.0–0.5)
HEMATOCRIT: 29 % — AB (ref 34.8–46.6)
HEMOGLOBIN: 9.5 g/dL — AB (ref 11.6–15.9)
LYMPH#: 0.8 10*3/uL — AB (ref 0.9–3.3)
LYMPH%: 27.8 % (ref 14.0–49.7)
MCH: 32.9 pg (ref 25.1–34.0)
MCHC: 32.7 g/dL (ref 31.5–36.0)
MCV: 100.8 fL (ref 79.5–101.0)
MONO#: 0.4 10*3/uL (ref 0.1–0.9)
MONO%: 12.2 % (ref 0.0–14.0)
NEUT#: 1.7 10*3/uL (ref 1.5–6.5)
NEUT%: 59.6 % (ref 38.4–76.8)
Platelets: 126 10*3/uL — ABNORMAL LOW (ref 145–400)
RBC: 2.88 10*6/uL — ABNORMAL LOW (ref 3.70–5.45)
RDW: 17.4 % — AB (ref 11.2–14.5)
WBC: 2.9 10*3/uL — ABNORMAL LOW (ref 3.9–10.3)

## 2014-01-25 LAB — PROTIME-INR
INR: 2.3 (ref 2.00–3.50)
Protime: 27.6 Seconds — ABNORMAL HIGH (ref 10.6–13.4)

## 2014-01-25 NOTE — Addendum Note (Signed)
Addended by: Joylene John D on: 01/25/2014 02:52 PM   Modules accepted: Orders

## 2014-01-25 NOTE — Progress Notes (Addendum)
INR at goal  Pt is doing well today with no complaints Pt to see Dr. Skeet Latch as well today No missed or extra doses No medication or diet changes No unusual bleeding or bruising Plan: No changes Continue 10 mg daily.  Return in 5 weeks with follow up visit with Dr. Marko Plume on 12/14. Lab at 8:45am, Flush at 9am, Coumadin clinic at 9:15am and Dr. Marko Plume Visit at 9:30am.    Samples:  Coumadin 10 mg tabs x 50 tabs LOT: [id number] Exp: 01/2015

## 2014-01-25 NOTE — Patient Instructions (Signed)
Plan to see Dr. Skeet Latch in three months or sooner.  We will call you with your Radiation Oncology appointment.  Please call for any questions or concerns.  Congrats on your upcoming wedding!

## 2014-01-25 NOTE — Patient Instructions (Signed)
INR at goal No changes Continue 10 mg daily.  Return in 5 weeks with follow up visit with Dr. Marko Plume on 12/14. Lab at 8:45am, Flush at 9am, Coumadin clinic at 9:15am and Dr. Marko Plume Visit at 9:30am.

## 2014-01-25 NOTE — Progress Notes (Signed)
Office Visit:  GYN ONCOLOGY  Chief Complaint:  Uterine carcinosarcoma, ovarian clear cell cancer  Assessment/Plan  [name] is a 48 y.o. 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  Physical examination was notable for cervical involvement.   She was S/P IVC filter. [name] received 4 cycles of chemotherapy with resolution of the cervical lesion and no further bleeding.  Final pathology c/w two primary malignancies.  Predominantly clear cell cancer arising in the left ovarian endometriotic cyst and primary uterine carcinosarcoma. Continue anticoagulation until radiation therapy is complete. Radiation oncology consult. Follow-up in 3 months  Thyroid nodule. Endocrinology managing   History of Present Illness  Caitlyn Higgins is a 48 y.o. who presented with a  pelvic mass, to [facility] and was transferred to [facility] 02/23/2013.   The patient feels that she was in her usual state of health until a few days prior to presentation when she noted abdominal distention and bloating. She presented to her gastroenterologist and was sent for a CT scan.  The CT 02/22/2013 demonstrated " 1. 24.1 x 16.8 x 19.9 cm complex cystic mass which appears to arise from the pelvis extending into the mid upper abdomen is highly suspicious for an ovarian neoplasm. There is evidence of early omental disease, as well as a serosal implant upon the surface of the liver, with small volume of probable malignant ascites, and potential transdiaphragmatic extension based on small lymph nodes immediately above the diaphragm.  2. Importantly, this pelvic mass is causing severe compression of the proximal inferior vena cava, and there is a large volume of deep venous thrombosis in the visualized portion of the right thigh  involving right superficial femoral, profunda femoral and common femoral veins. Additionally, there is a mass like peripheral wedge-shaped opacity in the right lower lobe. In the setting of deep venous thrombosis, this wedge-shaped opacity would be most concerning for potential pulmonary infarction. Associated with this, there is a trace right pleural effusion. 3. 5.2 x 1.8 cm heterogeneously enhancing lesion  associated with the liver capsule overlying the right lobe of the  liver "  She was transferred to [facility].    IVC filter was placed and she was started on Lovenox.  She was subsequently transitioned to warfarin.   In December 2014 an endometrial biopsy was collected. Pathology :  1. Cervix, biopsy - HIGH GRADE ENDOMETRIAL CARCINOMA, SEE COMMENT. 2. Endometrium, biopsy - HIGH GRADE ENDOMETRIAL CARCINOMA, SEE COMMENT. 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  [name]  received the 4th cycle of Taxol carboplatin therapy on 05/24/2013. She underwent interval debulking TAH BSO omentectomy 08/01/2013  Specimen(s): Left ovary and fallopian tube.  Left salpingo-oophorectomy Primary tumor site (including laterality): Left ovary Ovarian surface involvement: Present. Ovarian capsule intact without fragmentation: No. Maximum tumor size (cm): 20 cm, gross measurement. Histologic type: Mixed surface epithelial carcinoma with predominant clear cell carcinoma component (70%) and minor endometrioid component (30%) Please see comment for detail. Grade: High grade  TNM code: ypT1c2, pNX FIGO Stage (based on pathologic findings, needs clinical correlation): at least IC2 Comments: Received grossly is a 20 cm focally disrupted and partially collapsed multicystic ovary with attached/adherent omentum. Microscopically, sectioned  show an invasive high grade surface carcinoma arising in a background of endometriotic cyst. The tumor is predominately composed of clear cell carcinoma component with minor endometrioid carcinoma component. No tumor involvement is identified in the adherent omentum tissue. Immunohistochemical stains were performed and the clear cell carcinoma is patchy positive for p16, ER, p53, negative for WT-1 and p63. Focal angiolymphatic invasion is present.   The morphologic features are different from those seen in the uterus.  2. ONCOLOGY TABLE-UTERUS, CARCINOSARCOMA Specimen: Uterus, cervix and right fallopian tubes and ovaries. Procedure: Total hysterectomy and right salpingo-oophorectomy. Lymph node sampling performed: No. Specimen integrity: Intact. Maximum tumor size: Multiple microscopic foci, measuring up to 0.5 cm in greatest dimension. Histologic type: Carcinosarcoma (MMMT) with heterologous component (ossification) Grade: High grade Myometrial invasion: 2.1 cm where myometrium is 2.5 cm in thickness Cervical stromal involvement: No. Extent of involvement of other organs: No. Lymph - vascular invasion: Not identified.  FIGO Stage (based on pathologic findings, needs clinical correlation): IB Comment: Immunohistochemical stains were performed and the residual carcinoma is positive for p16, negative for WT-1, weakly positive for p53 and ER. The morphologic features are different from those seen in the left ovary. (HCL:gt, 08/04/13)   She has had total of 9 cycles of carboplatin and taxol from 03-14-14 thru 01-04-14.   She has not had radiation.  CT 01/22/2014 IMPRESSION: 1. Postoperative changes of hysterectomy and left salpingo-oophorectomy without evidence of recurrent or metastatic disease. 2. Scattered lymph nodes and small omental nodules are unchanged  Genetic testing negative  Past Medical History  Diagnosis Date  . Reflux   . Hypertension   . Cancer     uterine  carcinosarcoma  . DVT (deep venous thrombosis)     right leg 12'14  . Pulmonary emboli     12'14 denies any sob  . Elevated blood uric acid level     tx. Allopurinol  . GERD (gastroesophageal reflux disease)   . Transfusion history 07-26-13    12'14, 04-14-13(2 units), 06-09-13  . Anemia   . Family history of malignant neoplasm of breast   . Ovarian cancer 2015  . Asthma     endometrial ca   Past Surgical History  Procedure Laterality Date  . No past surgeries    . Portacath placement Right     rigth chest- remains  . Ivc Right     filter placed to prevent Pulmonary emboli  . Abdominal hysterectomy N/A 08/01/2013    Procedure: EXPLORATORY LAPAROTOMY/HYSTERECTOMY TOTAL ABDOMINAL;  Surgeon: Janie Morning, MD;  Location: [facility];  Service: Gynecology;  Laterality: N/A;  . Salpingoophorectomy Bilateral 08/01/2013    Procedure: BILATERAL SALPINGO OOPHORECTOMY/OMENTECTOMY ;  Surgeon: Janie Morning, MD;  Location: [facility];  Service: Gynecology;  Laterality: Bilateral;     GYN History G0 Hx of Abnormal Pap Smears: no, last pap ~2010  Hx of STIs: no  Never had regular periods.   Social Hx:   Getting married in April. Already has her wedding dress.  Family History  several second degree relatives with breast cancer.  Review of Systems  Reports recent weight gain.  Denies cough, no hemoptysis, reports intermittent nausea she reports constipation. No vaginal bleeding, no rectal bleeding. No dysphagia, no vaginal bleeding, otherwise ROS negative  Physical Exam  BP 130/67 mmHg  Pulse 76  Temp(Src) 98 F (36.7 C) (Oral)  Resp 18  Ht '5\' 7"'  (1.702 m)  Wt 233 lb 6.4 oz (105.87 kg)  BMI 36.55 kg/m2  LMP 03/09/2013 Wt  Readings from Last 3 Encounters:  01/25/14 233 lb 6.4 oz (105.87 kg)  01/10/14 233 lb 12.8 oz (106.051 kg)  12/20/13 227 lb 14.4 oz (103.375 kg)   General: No acute distress.  HEENT: Extra occular muscles grossly intact.  Pulmonary: CTAB. Normal work of breathing.   Cardiovascular: RRR.  Abdomen: Soft NT.  Midline incision well healed.  No masses or hernia.  No rebound/guarding.  Musculoskeletal: Grossly normal ROM.  Extremities: Warm and well-perfused. 1+ pitting edema  Neurological: Alert and oriented x3.  Psychological: Appropriate mood/affect.  Genitourinary:  Nl EGBUS, Vagina without bleeding or discharge.  Cuff intact.  No cul de sac masses. Breasts:  Pendulous, no masses, no dimpling no retractions no nipple discharge. LN:  No cervical supraclavicular or inguinal adenopathy.

## 2014-01-26 ENCOUNTER — Telehealth: Payer: Self-pay

## 2014-01-26 NOTE — Telephone Encounter (Signed)
-----   Message from Gordy Levan, MD sent at 01/25/2014  9:40 PM EST ----- Labs seen and need follow up  Please let her know CBC better from 01-25-14: anemia some better, platelets improved, not neutropenic tho WBC still on low side. Keep appt LL in Dec as planned, call if need anything prior

## 2014-01-26 NOTE — Telephone Encounter (Signed)
Told Caitlyn Higgins the results of her cbc as noted below by Dr. Marko Plume.  Patient verbalized understanding.

## 2014-02-01 NOTE — Progress Notes (Signed)
GYN Location of Tumor / Histology: Uterine Carcinosarcoma, Ovarian clear Cell  Caitlyn Higgins presented on Oct 2014, when she developed frequent nausea and early satiety and began to lose weight, at least 25 - 30 lbs in total. She subsequently had more abdominal distension, tho just minimal discomfort which improved with motrin. Vaginal bleeding was irregular thru this time, not unusual for her, then continuous x 3-4 weeks. Last PAP was ~ 2010. She was seen at ED in mid November 2014 with nonproductive cough, low fever and no acute chest pain; she was placed on levaquin for presumed pneumonia. She self-referred to Dr Michail Sermon due to the GI symptoms, with CT AP 02-22-2013 in [facility] showing mass from pelvis into mid to upper abdomen ~ 24 x 16.8 x 19.9 cm which is predominately cystic but with irregular solid areas and septations, adjacent to but not clearly arising from uterine fundus, 5.2 x 1.8 cm enhancing area at right liver capsule, likely omental disease, small volume ascites, pleural based wedge shaped opacity RLL lung, likely adenopathy inferior aspect of anteroir mediastinum, no bowel obstruction, no hydronephrosis, DVT right common femoral vein/ profunda femoral and superficial femoral veins, near complete compression of proximal IVC by mass, prominent right external iliac nodes.  [facility] on 12[facility]4 thru 02-26-13. CT biopsy of abdominal mass by IR at [facility] was nondiagnostic and IVC filter was placed. She was transfused 2 units PRBCs on 12-4 for Hgb 6. Initial renal insufficiency was felt prerenal from dehydration, but precluded CT angio chest to confirm PEs; creatinine was 2.1 on 02-22-13 and down to 1.2 by DC from [facility]. Seen by Dr Skeet Latch on 03-07-13, with ongoing vaginal bleeding and gross tumor at external os and involving endocervical canal, as well as large, fixed, multinodular mass noted in pelvis and cul de sac.      Biopsies of the Uterus and Cervix:Adnexa - ovary +/- tube,  neoplastic, left with omentum 08/01/13 Diagnosis 1. Adnexa - ovary +/- tube, neoplastic, left with omentum - HIGH GRADE CARCINOMA WITH BOTH CLEAR CELL CARCINOMA AND ENDOMETRIOID CARCINOMA COMPONENTS, 20 CM. PLEASE SEE ONCOLOGY TEMPLATE FOR DETAIL. 2. Uterus and cervix, with right fallopian tube and right ovary - MICROSCOPIC FOCI OF RESIDUAL CARCINOSARCOMA WITH HETEROLOGOUS ELEMENT, INVOLVING OUTER HALF OF THE MYOMETRIUM. - EXTENSIVE ADENOMYOSIS AND LEIOMYOMA. - CERVIX: BENIGN SQUAMOUS MUCOSA AND ENDOCERVICAL MUCOSA, NO DYSPLASIA OR MALIGNANCY. - RIGHT OVARY AND FALLOPIAN TUBES: NO PATHOLOGIC ABNORMALITIES  03/07/13 Diagnosis 1. Cervix, biopsy - HIGH GRADE ENDOMETRIAL CARCINOMA, SEE COMMENT. 2. Endometrium, biopsy - HIGH GRADE ENDOMETRIAL CARCINOMA, SEE COMMENT  Past/Anticipated interventions by Gyn/Onc surgery, if any: Dr. Francetta Found. [place]:  Exploratory laparotomy with total hysterectomy bilateral salpingo-oophorectomy, omental biopsies.   Past/Anticipated interventions by medical oncology, if any: Dr. Vernell Morgans P.[place]: 4 cycles of Carboplatin/Taxol prior to debulking surgery on 08/01/13: Resumption of Chemotherapy with cycle 5 Carboplatin/Taxol on 08-22-13   Weight changes, if any: 6 lb weight gain since 12/20/13  Bowel/Bladder complaints, if any: No  Nausea/Vomiting, if any: No  Pain issues, if any: No pain in pelvic area, but has aching of lower extremities  SAFETY ISSUES:  Prior radiation? No  Pacemaker/ICD?No  Possible current pregnancy? No  Is the patient on methotrexate?No  Current Complaints / other details: Menarche age [age], Never pregnant,No BC, hot flashes with chemotherapy

## 2014-02-01 NOTE — Progress Notes (Signed)
Radiation Oncology         [phone] ________________________________  Initial outpatient Consultation  Name: Caitlyn Higgins MRN: [id number]  Date: 02/02/2014  DOB: [birth year]  YJ:EHUDJS, Gwyndolyn Saxon, MD  Janie Morning, MD   REFERRING PHYSICIAN: Janie Morning, MD  DIAGNOSIS:    ICD-9-CM ICD-10-CM   1. Uterine carcinosarcoma [name] is a 48 y.o. female who presented with a large pelvic mass.  This was complicated by IVC compression and DVT. Also, vaginal bleeding, with a cervical mass on speculum exam.  Bx in Dec 2014 of cervix and uterus showed high grade endometrial carcinoma.  She was given and IVC filter, warfarin, and managed with chemotherapy --> surgery including exploratory laparotomy, TAH, BSO on 08-01-13 --> chemotherapy . She's had a total of 9 cycles Carbo Taxol between Dec 2014 and Oct 2015.  Her CT on 01/22/14 revealed post-op changes, and scattered stable nodes.   I spoke with Dr. Skeet Latch about her today. She did remarkably well with therapy.  Her final pathology after surgery in May showed two primary cancers - uterine carcinosarcoma, and ovarian clear cell carcinoma.  Although many of the notes implied in her EMR that she could have Stage IV disease, Dr. Skeet Latch feels the best case scenario is that she had a Stage III ovarian cancer and Stage II uterine cancer.   She has not had any biopsy proven metastatic disease.   PATH: Diagnosis 1. Adnexa - ovary +/- tube, neoplastic, left with omentum - HIGH GRADE CARCINOMA WITH BOTH CLEAR CELL CARCINOMA AND ENDOMETRIOID CARCINOMA COMPONENTS, 20 CM. PLEASE SEE ONCOLOGY TEMPLATE FOR DETAIL. 2. Uterus and cervix, with right fallopian tube and right ovary - MICROSCOPIC FOCI OF RESIDUAL CARCINOSARCOMA WITH HETEROLOGOUS ELEMENT, INVOLVING OUTER HALF OF THE MYOMETRIUM. - EXTENSIVE ADENOMYOSIS AND LEIOMYOMA. - CERVIX: BENIGN SQUAMOUS MUCOSA AND ENDOCERVICAL MUCOSA, NO  DYSPLASIA OR MALIGNANCY. - RIGHT OVARY AND FALLOPIAN TUBES: NO PATHOLOGIC ABNORMALITIES. Microscopic Comment 1. OVARY Specimen(s): Left ovary and fallopian tube. Procedure: (including lymph node sampling): Left salpingo-oophorectomy Primary tumor site (including laterality): Left ovary Ovarian surface involvement: Present. Ovarian capsule intact without fragmentation: No. Maximum tumor size (cm): 20 cm, gross measurement. Histologic type: Mixed surface epithelial carcinoma with predominant clear cell carcinoma component (70%) and minor endometrioid component (30%) Please see comment for detail. Grade: High grade Peritoneal implants: (specify invasive or non-invasive): N/A Pelvic extension (list additional structures on separate lines and if involved): No. Lymph nodes: number examined - N/A ; number positive - N/A TNM code: ypT1c2, pNX FIGO Stage (based on pathologic findings, needs clinical correlation): at least IC2 Comments: Received grossly is a 20 cm focally disrupted and partially collapsed multicystic ovary with attached/adherent omentum. Microscopically, sectioned show an invasive high grade surface carcinoma arising in a background of endometriotic cyst. The tumor is predominately composed of clear cell carcinoma component with minor endometrioid carcinoma component. No tumor involvement is identified in the adherent omentum tissue. Immunohistochemical stains were performed and the clear cell carcinoma is patchy positive for p16, ER, p53, negative for WT-1 and p63. Focal angiolymphatic invasion is present. 1 of 3 FINAL for Caitlyn Higgins [phone]) Microscopic Comment(continued) The morphologic features are different from those seen in the uterus. 2. ONCOLOGY TABLE-UTERUS, CARCINOSARCOMA Specimen: Uterus, cervix and right fallopian tubes and ovaries. Procedure: Total hysterectomy and right salpingo-oophorectomy. Lymph node sampling performed: No. Specimen integrity:  Intact. Maximum tumor size: Multiple microscopic foci, measuring up to 0.5 cm in greatest dimension.  Histologic type: Carcinosarcoma (MMMT) with heterologous component (ossification) Grade: High grade Myometrial invasion: 2.1 cm where myometrium is 2.5 cm in thickness Cervical stromal involvement: No. Extent of involvement of other organs: No. Lymph - vascular invasion: Not identified. Peritoneal washings: N/A Lymph nodes: # examined N/A ; # positive N/A Pelvic lymph nodes: N/A involved of N/A lymph nodes. Para-aortic lymph nodes: N/A involved of N/A lymph nodes. Other (specify involvement and site): N/A TNM code: ypT1b, ypNX FIGO Stage (based on pathologic findings, needs clinical correlation): IB Comment: Immunohistochemical stains were performed and the residual carcinoma is positive for p16, negative for WT-1, weakly positive for p53 and ER. The morphologic features are different from those seen in the left ovary. (HCL:gt, 08/04/13) H. CATHERINE LI MD  PREVIOUS RADIATION THERAPY: No  PAST MEDICAL HISTORY:  has a past medical history of Reflux; Hypertension; Cancer; DVT (deep venous thrombosis); Pulmonary emboli; Elevated blood uric acid level; GERD (gastroesophageal reflux disease); Transfusion history (07-26-13); Anemia; Family history of malignant neoplasm of breast; Ovarian cancer (2015); and Asthma.    PAST SURGICAL HISTORY: Past Surgical History  Procedure Laterality Date  . No past surgeries    . Portacath placement Right     rigth chest- remains  . Ivc Right     filter placed to prevent Pulmonary emboli  . Abdominal hysterectomy N/A 08/01/2013    Procedure: EXPLORATORY LAPAROTOMY/HYSTERECTOMY TOTAL ABDOMINAL;  Surgeon: Janie Morning, MD;  Location: [facility];  Service: Gynecology;  Laterality: N/A;  . Salpingoophorectomy Bilateral 08/01/2013    Procedure: BILATERAL SALPINGO OOPHORECTOMY/OMENTECTOMY ;  Surgeon: Janie Morning, MD;  Location: [facility];  Service: Gynecology;   Laterality: Bilateral;    FAMILY HISTORY: family history includes Breast cancer (age of onset: 59) in her maternal aunt; Breast cancer (age of onset: 51) in her cousin; Breast cancer (age of onset: 72) in her maternal aunt; Breast cancer (age of onset: 63) in her paternal aunt; Breast cancer (age of onset: 91) in her paternal aunt; Diabetes in her father and mother; Hypertension in her father and mother; Prostate cancer in her maternal uncle; Prostate cancer (age of onset: 43) in her father; Stomach cancer in her paternal grandmother. There is no history of Thyroid disease.  SOCIAL HISTORY:  reports that she has never smoked. She does not have any smokeless tobacco history on file. She reports that she does not drink alcohol or use illicit drugs.  ALLERGIES: Review of patient's allergies indicates no known allergies.  MEDICATIONS:  Current Outpatient Prescriptions  Medication Sig Dispense Refill  . allopurinol (ZYLOPRIM) 100 MG tablet Take 1 tablet (100 mg total) by mouth daily. 30 tablet 0  . docusate sodium (COLACE) 100 MG capsule Take 100 mg by mouth daily.    . ferrous fumarate (HEMOCYTE - 106 MG FE) 325 (106 FE) MG TABS tablet Take 1 tablet (106 mg of iron total) by mouth daily. 30 each 0  . levothyroxine (SYNTHROID, LEVOTHROID) 50 MCG tablet Take 1 tablet (50 mcg total) by mouth daily. 30 tablet 2  . lidocaine-prilocaine (EMLA) cream Apply 1 application topically as needed (port).    . loratadine (CLARITIN) 10 MG tablet Take 10 mg by mouth daily.    Marland Kitchen LORazepam (ATIVAN) 0.5 MG tablet Place 1-2 tablets under the tongue or swallow every 4-6 hours as needed for nausea.  Will make drowsy 20 tablet 0  . omeprazole (PRILOSEC) 40 MG capsule Take 1 capsule (40 mg total) by mouth daily. 90 capsule 2  . Oxycodone HCl 10 MG  TABS Take 1 tablet every 4-6 hours as needed for pain. 40 tablet 0  . warfarin (COUMADIN) 5 MG tablet Take 10 mg by mouth every evening.     . ondansetron (ZOFRAN-ODT) 4 MG  disintegrating tablet Take 1-2 tablets (4-8 mg total) by mouth every 8 (eight) hours as needed for nausea or vomiting. 30 tablet 2  . OxyCODONE (OXYCONTIN) 15 mg T12A 12 hr tablet Take 1 tablet (15 mg total) by mouth every 12 (twelve) hours. 60 tablet 0   No current facility-administered medications for this encounter.    REVIEW OF SYSTEMS:  Notable for that above.   PHYSICAL EXAM:  height is '5\' 7"'  (1.702 m) and weight is 244 lb 8 oz (110.904 kg). Her temperature is 98.1 F (36.7 C). Her blood pressure is 118/59 and her pulse is 80.   General: Alert and oriented, in no acute distress HEENT: Head is normocephalic. Extraocular movements are intact. Oropharynx is clear. Neck: Neck is supple, no palpable cervical or supraclavicular lymphadenopathy. Heart: Regular in rate and rhythm with no murmurs, rubs, or gallops. Chest: Clear to auscultation bilaterally, with no rhonchi, wheezes, or rales. Abdomen: Soft, nontender, nondistended, with no rigidity or guarding. Extremities: No cyanosis or edema. Lymphatics: see Neck Exam Skin: No concerning lesions. Musculoskeletal: symmetric strength and muscle tone throughout. Neurologic: Cranial nerves II through XII are grossly intact. No obvious focalities. Speech is fluent. Coordination is intact. Psychiatric: Judgment and insight are intact. Affect is appropriate.  GYN: no inguinal, external genitalia, or vaginal cuff lesions appreciated on speculum exam  ECOG = 0  0 - Asymptomatic (Fully active, able to carry on all predisease activities without restriction)  1 - Symptomatic but completely ambulatory (Restricted in physically strenuous activity but ambulatory and able to carry out work of a light or sedentary nature. For example, light housework, office work)  2 - Symptomatic, <50% in bed during the day (Ambulatory and capable of all self care but unable to carry out any work activities. Up and about more than 50% of waking hours)  3 -  Symptomatic, >50% in bed, but not bedbound (Capable of only limited self-care, confined to bed or chair 50% or more of waking hours)  4 - Bedbound (Completely disabled. Cannot carry on any self-care. Totally confined to bed or chair)  5 - Death   Eustace Pen MM, Creech RH, Tormey DC, et al. [phone]). "Toxicity and response criteria of the [employer]". [place] Oncol. 5 (6): 649-55   LABORATORY DATA:  Lab Results  Component Value Date   WBC 2.9* 01/25/2014   HGB 9.5* 01/25/2014   HCT 29.0* 01/25/2014   MCV 100.8 01/25/2014   PLT 126* 01/25/2014   CMP     Component Value Date/Time   NA 142 01/10/2014 0841   NA 139 12/07/2013 1121   K 3.5 01/10/2014 0841   K 4.5 12/07/2013 1121   CL 103 12/07/2013 1121   CO2 27 01/10/2014 0841   CO2 23 12/07/2013 1121   GLUCOSE 88 01/10/2014 0841   GLUCOSE 133* 12/07/2013 1121   BUN 17.2 01/10/2014 0841   BUN 26* 12/07/2013 1121   CREATININE 1.0 01/10/2014 0841   CREATININE 0.90 12/07/2013 1121   CALCIUM 8.9 01/10/2014 0841   CALCIUM 10.0 12/07/2013 1121   PROT 8.2 01/10/2014 0841   PROT 9.6* 12/07/2013 1121   ALBUMIN 3.6 01/10/2014 0841   ALBUMIN 4.0 12/07/2013 1121   AST 23 01/10/2014 0841   AST 25 12/07/2013 1121  ALT 17 01/10/2014 0841   ALT 16 12/07/2013 1121   ALKPHOS 101 01/10/2014 0841   ALKPHOS 98 12/07/2013 1121   BILITOT 0.23 01/10/2014 0841   BILITOT 0.4 12/07/2013 1121   GFRNONAA 44* 08/02/2013 0505   GFRAA 51* 08/02/2013 0505         RADIOGRAPHY: Ct Abdomen Pelvis W Contrast  01/22/2014   CLINICAL DATA:  Endometrial carcinoma, chemotherapy complete. Left ovarian epithelial cancer.  EXAM: CT ABDOMEN AND PELVIS WITH CONTRAST  TECHNIQUE: Multidetector CT imaging of the abdomen and pelvis was performed using the standard protocol following bolus administration of intravenous contrast.  CONTRAST:  168m OMNIPAQUE IOHEXOL 300 MG/ML  SOLN  COMPARISON:  10/10/2013.  FINDINGS: Lower chest: Lung bases show  subpleural scarring in the right lower lobe. Heart size normal. No pericardial or pleural effusion. Retrocrural lymph nodes are subcentimeter in short axis size.  Hepatobiliary: Liver and gallbladder are unremarkable. No biliary ductal dilatation.  Pancreas: Negative.  Spleen: A vague area of rounded low-attenuation in the spleen measures 7 mm (series 2, image 18), likely stable.  Adrenals/Urinary Tract: Adrenal glands are unremarkable. Probable early excretion of contrast from the kidneys rather than stones. Kidneys are otherwise unremarkable. Ureters are decompressed. Bladder is grossly unremarkable.  Stomach/Bowel: Stomach, small bowel, appendix and colon are unremarkable.  Vascular/Lymphatic: IVC filter is in place. Trace atherosclerotic calcification of the arterial vasculature without abdominal aortic aneurysm. Scattered lymph nodes are generally subcentimeter in short axis size, stable. Right external iliac lymph node measures 1.6 cm (series 2, image 75), stable.  Reproductive: Hysterectomy and left salpingo-oophorectomy. Right ovary is not well visualized.  Other: No free fluid. Tiny omental nodules measure up to 5 mm (series 2, image 41), stable. Small bowel mesenteric nodes are subcentimeter in size and unchanged.  Musculoskeletal: No worrisome lytic or sclerotic lesions.  IMPRESSION: 1. Postoperative changes of hysterectomy and left salpingo-oophorectomy without evidence of recurrent or metastatic disease. 2. Scattered lymph nodes and small omental nodules are unchanged.   Electronically Signed   By: MLorin PicketM.D.   On: 01/22/2014 08:59      IMPRESSION/PLAN: lovely 48yo woman with uterine carcinosarcoma, and ovarian clear cell carcinoma.  Although many of the notes implied in her EMR that she could have Stage IV disease, Dr. BSkeet Latchfeels the best case scenario is that she had a Stage III ovarian cancer and Stage II uterine cancer.    1) Obtain CT chest for restaging  2) Today, I talked to  the patient about the findings and treatment thus far. We discussed the patient's diagnosis of uterine carcinosarcoma, ovarian clear cell carcinoma and general treatment for this, highlighting the role of radiotherapy in the management of uterine cancer. We discussed the available radiation techniques, and focused on the details of logistics and delivery.  Dr. BSkeet Latchand I agree that the benefits of RT would be limited, if any, for Stage IV disease. But giving her the benefit of the doubt, if she had Stage III Ovarian and Stage II Uterine cancer, RT could help cure her cancer.  The site of greatest concern for recurrence could be the vaginal cuff, given her history of cervical involvement at diagnosis. We acknowledge she is at significant risk of failing elsewhere, and if she does, local RT could end up being futile  I talked about these issues with the patient.  She wants to be aggressive. I think that HDR vaginal cuff brachytherapy would be relatively well tolerated and could benefit her. It  would be less toxic than pelvic RT, most likely.  We discussed the risks, benefits, and side effects of radiotherapy. Side effects may include but not necessarily be limited to: fatigue, vaginal stenosis, vaginal dryness, rare fistula or pelvic organ injury. No guarantees of treatment were given. She is enthusiastic to proceed. A consent form was signed and placed in the patient's medical record.   __________________________________________   Eppie Gibson, MD

## 2014-02-02 ENCOUNTER — Ambulatory Visit
Admission: RE | Admit: 2014-02-02 | Discharge: 2014-02-02 | Disposition: A | Discharge status: Home / Self Care | Payer: No Typology Code available for payment source | Source: Ambulatory Visit | Attending: Radiation Oncology | Admitting: Radiation Oncology

## 2014-02-02 ENCOUNTER — Encounter: Payer: Self-pay | Admitting: Radiation Oncology

## 2014-02-02 ENCOUNTER — Ambulatory Visit (HOSPITAL_BASED_OUTPATIENT_CLINIC_OR_DEPARTMENT_OTHER): Payer: No Typology Code available for payment source

## 2014-02-02 VITALS — BP 118/59 | HR 80 | Temp 98.1°F | Ht 67.0 in | Wt 244.5 lb

## 2014-02-02 DIAGNOSIS — C55 Malignant neoplasm of uterus, part unspecified: Secondary | ICD-10-CM | POA: Diagnosis not present

## 2014-02-02 DIAGNOSIS — Z510 Encounter for antineoplastic radiation therapy: Secondary | ICD-10-CM | POA: Diagnosis present

## 2014-02-02 DIAGNOSIS — Z95828 Presence of other vascular implants and grafts: Secondary | ICD-10-CM

## 2014-02-02 DIAGNOSIS — Z452 Encounter for adjustment and management of vascular access device: Secondary | ICD-10-CM

## 2014-02-02 MED ORDER — SODIUM CHLORIDE 0.9 % IJ SOLN
10.0000 mL | INTRAMUSCULAR | Status: DC | PRN
  Administered 2014-02-02: 10 mL via INTRAVENOUS
  Filled 2014-02-02: qty 10

## 2014-02-02 MED ORDER — HEPARIN SOD (PORK) LOCK FLUSH 100 UNIT/ML IV SOLN
500.0000 [IU] | Freq: Once | INTRAVENOUS | Status: AC
  Administered 2014-02-02: 500 [IU] via INTRAVENOUS
  Filled 2014-02-02: qty 5

## 2014-02-06 ENCOUNTER — Telehealth: Payer: Self-pay | Admitting: Radiation Oncology

## 2014-02-06 NOTE — Telephone Encounter (Signed)
Called patient to let her know of her CT scan on Thursday, Nov. 19 @ 9:45 (arrival). NPO 4 hours. She is agreeable.

## 2014-02-08 ENCOUNTER — Encounter (HOSPITAL_COMMUNITY): Payer: Self-pay

## 2014-02-08 ENCOUNTER — Ambulatory Visit (HOSPITAL_COMMUNITY)
Admission: RE | Admit: 2014-02-08 | Discharge: 2014-02-08 | Disposition: A | Discharge status: Home / Self Care | Payer: No Typology Code available for payment source | Source: Ambulatory Visit | Attending: Radiation Oncology | Admitting: Radiation Oncology

## 2014-02-08 DIAGNOSIS — J984 Other disorders of lung: Secondary | ICD-10-CM | POA: Diagnosis not present

## 2014-02-08 DIAGNOSIS — I2510 Atherosclerotic heart disease of native coronary artery without angina pectoris: Secondary | ICD-10-CM | POA: Insufficient documentation

## 2014-02-08 DIAGNOSIS — E049 Nontoxic goiter, unspecified: Secondary | ICD-10-CM | POA: Insufficient documentation

## 2014-02-08 DIAGNOSIS — C55 Malignant neoplasm of uterus, part unspecified: Secondary | ICD-10-CM

## 2014-02-08 DIAGNOSIS — C569 Malignant neoplasm of unspecified ovary: Secondary | ICD-10-CM | POA: Diagnosis present

## 2014-02-08 MED ORDER — IOHEXOL 300 MG/ML  SOLN
80.0000 mL | Freq: Once | INTRAMUSCULAR | Status: AC | PRN
  Administered 2014-02-08: 80 mL via INTRAVENOUS

## 2014-02-12 ENCOUNTER — Telehealth: Payer: Self-pay | Admitting: Radiation Oncology

## 2014-02-12 NOTE — Telephone Encounter (Signed)
I spoke with the patient today re: her Chest CT results. I informed her it is stable, but the PAC tip has migrated. I let her know that I spoke with Dr Marko Plume who will kindly look at the images and determine if it needs adjustment. Her folks will let the pt know if adjustment of the PAC tip is needed.  She appreciated my call. Will proceed with RT as planned after the Thanksgiving weekend.  -----------------------------------  Eppie Gibson, MD

## 2014-02-14 NOTE — Addendum Note (Signed)
Encounter addended by: Deirdre Evener, RN on: 02/14/2014 10:47 AM<BR>     Documentation filed: Charges VN

## 2014-02-16 ENCOUNTER — Other Ambulatory Visit: Payer: Self-pay | Admitting: *Deleted

## 2014-02-16 ENCOUNTER — Telehealth: Payer: Self-pay | Admitting: *Deleted

## 2014-02-16 DIAGNOSIS — C541 Malignant neoplasm of endometrium: Secondary | ICD-10-CM

## 2014-02-16 NOTE — Telephone Encounter (Signed)
Pt informed of message below. Would like to wait to have PAC removed after RT 12/18

## 2014-02-16 NOTE — Telephone Encounter (Signed)
-----   Message from Gordy Levan, MD sent at 02/16/2014  8:55 AM EST ----- She had CT chest by Dr Isidore Moos, which looked good except PAC has gotten in wrong position, out of SVC. Since we are not using this now, I think it would be best to have IR remove this PAC.  Hopefully we will not need a PAC in future, but it could be put back in if we do need another one later - but we do not plan more chemo at this point. If patient is ok with this, please schedule removal of PAC with IR. OK to hold coumadin if needed for procedure (she has IVC filter). Please let coumadin clinic know if so.  Thanks Lennis

## 2014-02-20 ENCOUNTER — Telehealth: Payer: Self-pay | Admitting: *Deleted

## 2014-02-20 ENCOUNTER — Other Ambulatory Visit: Payer: Self-pay | Admitting: *Deleted

## 2014-02-20 DIAGNOSIS — C541 Malignant neoplasm of endometrium: Secondary | ICD-10-CM

## 2014-02-20 MED ORDER — OXYCODONE HCL 10 MG PO TABS: ORAL_TABLET | ORAL | Status: DC

## 2014-02-20 NOTE — Telephone Encounter (Signed)
CALLED PATIENT TO REMIND OF HDR CASE FOR 02-21-14, SPOKE WITH PATIENT AND SHE WILL BE HERE.

## 2014-02-21 ENCOUNTER — Ambulatory Visit
Admission: RE | Admit: 2014-02-21 | Discharge: 2014-02-21 | Disposition: A | Discharge status: Home / Self Care | Payer: No Typology Code available for payment source | Source: Ambulatory Visit | Attending: Radiation Oncology | Admitting: Radiation Oncology

## 2014-02-21 DIAGNOSIS — Z510 Encounter for antineoplastic radiation therapy: Secondary | ICD-10-CM | POA: Diagnosis not present

## 2014-02-21 DIAGNOSIS — C55 Malignant neoplasm of uterus, part unspecified: Secondary | ICD-10-CM

## 2014-02-21 DIAGNOSIS — C541 Malignant neoplasm of endometrium: Secondary | ICD-10-CM

## 2014-02-21 NOTE — Progress Notes (Signed)
SIMULATION AND TREATMENT PLANNING NOTE HDR BRACHYTHERAPY  DIAGNOSIS:    ICD-9-CM ICD-10-CM   1. Endometrial cancer 182.0 C54.1     NARRATIVE: The patient was brought to the [facility]. Identity was confirmed. All relevant records and images related to the planned course of therapy were reviewed. The patient freely provided informed written consent to proceed with treatment after reviewing the details related to the planned course of therapy. The consent form was witnessed and verified by the simulation staff. Then, the patient was set-up in a stable reproducible supine position for radiation therapy. The patient's custom vaginal cylinder was placed in the proximal vagina. This was fixed to the CT/MR stabilization plate to prevent slippage. CT images were obtained. Surface markings were placed. The CT images were loaded into the planning software. Then the target and avoidance structures were contoured. Treatment planning then occurred. The radiation prescription was entered and confirmed. I have requested : Brachytherapy Isodose Plan and Dosimetry Calculations to plan the radiation distribution.   PLAN: The patient will receive 30Gy in 5 fractions. Prescription will be to the vaginal mucosal surface. Treatment length will be 4 cm. The patient will be treated with a 3.0cm diameter cylinder.  Iridium 192 will be the high-dose-rate source. -----------------------------------  Eppie Gibson, MD

## 2014-02-21 NOTE — Progress Notes (Signed)
HDR BRACHYTHERAPY  DIAGNOSIS:     ICD-9-CM ICD-10-CM   1. Endometrial cancer 182.0 C54.1     NARRATIVE: After planning was complete the patient was transferred to the high-dose-rate suite. She was placed in the supine position. The patient's custom vaginal cylinder was placed within the vaginal vault. This was affixed to the CT/MR stabilization plate to prevent slippage.  Verification simulation  A fiducial marker was placed within the vaginal cylinder. Patient then had an AP and lateral film obtained. This was compared to the patient's planning films showing accurate position of the vaginal cylinder for treatment.  High-dose-rate treatment  The remote afterloading catheter was affixed to the vaginal cylinder. The patient then proceeded to undergo her first high-dose-rate treatment. She was prescribed a dose of 6 Gy to be delivered to the proximal vaginal mucosal surface. This was achieved with a 3 cm diameter cylinder with a treatment length of 4 cm, 9 dwell positions. Total treatment time was 346.6 seconds. Patient tolerated the procedure well. After completion of her therapy a radiation survey was performed documenting return of the iridium source into the GammaMed safe.  -----------------------------------  Eppie Gibson, MD

## 2014-02-23 ENCOUNTER — Telehealth: Payer: Self-pay | Admitting: *Deleted

## 2014-02-23 NOTE — Telephone Encounter (Signed)
Called patient to remind of HDR Tx. For 02-26-14, spoke with patient and she is aware of this appt.

## 2014-02-26 ENCOUNTER — Ambulatory Visit
Admission: RE | Admit: 2014-02-26 | Discharge: 2014-02-26 | Disposition: A | Discharge status: Home / Self Care | Payer: No Typology Code available for payment source | Source: Ambulatory Visit | Attending: Radiation Oncology | Admitting: Radiation Oncology

## 2014-02-26 DIAGNOSIS — C541 Malignant neoplasm of endometrium: Secondary | ICD-10-CM

## 2014-02-26 NOTE — Progress Notes (Signed)
HDR BRACHYTHERAPY 02-26-14  DIAGNOSIS:     ICD-9-CM ICD-10-CM   1. Endometrial cancer 182.0 C54.1     NARRATIVE: After planning was complete the patient was transferred to the high-dose-rate suite. She was placed in the supine position. The patient's custom vaginal cylinder was placed within the vaginal vault. This was affixed to the CT/MR stabilization plate to prevent slippage.  Verification simulation  A fiducial marker was placed within the vaginal cylinder. Patient then had an AP and lateral film obtained. This was compared to the patient's planning films showing accurate position of the vaginal cylinder for treatment.  High-dose-rate treatment  The remote afterloading catheter was affixed to the vaginal cylinder. The patient then proceeded to undergo her 2nd high-dose-rate treatment. She was prescribed a dose of 6 Gy to be delivered to the proximal vaginal mucosal surface. This was achieved with a 3 cm diameter cylinder with a treatment length of 4 cm, 9 dwell positions. Total treatment time was 363.2 seconds. Patient tolerated the procedure well. After completion of her therapy a radiation survey was performed documenting return of the iridium source into the GammaMed safe.  -----------------------------------  Eppie Gibson, MD

## 2014-03-01 ENCOUNTER — Telehealth: Payer: Self-pay | Admitting: *Deleted

## 2014-03-01 NOTE — Telephone Encounter (Signed)
CALLED PATIENT TO INFORM OF HDR TX. FOR 03-02-14 @ 9 AM, SPOKE WITH PATIENT AND SHE IS AWARE OF THIS HDR [place].

## 2014-03-02 ENCOUNTER — Ambulatory Visit
Admission: RE | Admit: 2014-03-02 | Discharge: 2014-03-02 | Disposition: A | Discharge status: Home / Self Care | Payer: No Typology Code available for payment source | Source: Ambulatory Visit | Attending: Radiation Oncology | Admitting: Radiation Oncology

## 2014-03-02 ENCOUNTER — Telehealth: Payer: Self-pay | Admitting: *Deleted

## 2014-03-02 DIAGNOSIS — C541 Malignant neoplasm of endometrium: Secondary | ICD-10-CM

## 2014-03-02 DIAGNOSIS — Z510 Encounter for antineoplastic radiation therapy: Secondary | ICD-10-CM | POA: Diagnosis not present

## 2014-03-02 NOTE — Progress Notes (Signed)
HDR BRACHYTHERAPY 03-02-14 DIAGNOSIS:     ICD-9-CM ICD-10-CM   1. Endometrial cancer 182.0 C54.1     NARRATIVE: After planning was complete the patient was transferred to the high-dose-rate suite. She was placed in the supine position. The patient's custom vaginal cylinder was placed within the vaginal vault. This was affixed to the CT/MR stabilization plate to prevent slippage.  Verification simulation  A fiducial marker was placed within the vaginal cylinder. Patient then had an AP and lateral film obtained. This was compared to the patient's planning films showing accurate position of the vaginal cylinder for treatment.  High-dose-rate treatment  The remote afterloading catheter was affixed to the vaginal cylinder. The patient then proceeded to undergo her 3rd high-dose-rate treatment. She was prescribed a dose of 6 Gy to be delivered to the proximal vaginal mucosal surface. This was achieved with a 3 cm diameter cylinder with a treatment length of 4 cm, 9 dwell positions. Total treatment time was 377.3 seconds. Patient tolerated the procedure well. After completion of her therapy a radiation survey was performed documenting return of the iridium source into the GammaMed safe.  -----------------------------------  Eppie Gibson, MD

## 2014-03-02 NOTE — Telephone Encounter (Signed)
CALLED PATIENT TO REMIND OF HDR TX. FOR 03-05-14 @ 10 AM, SPOKE WITH PATIENT AND SHE IS AWARE OF THIS [place].

## 2014-03-04 ENCOUNTER — Other Ambulatory Visit: Payer: Self-pay | Admitting: Oncology

## 2014-03-05 ENCOUNTER — Encounter: Payer: Self-pay | Admitting: Oncology

## 2014-03-05 ENCOUNTER — Ambulatory Visit: Payer: No Typology Code available for payment source | Admitting: Oncology

## 2014-03-05 ENCOUNTER — Telehealth: Payer: Self-pay | Admitting: Oncology

## 2014-03-05 ENCOUNTER — Other Ambulatory Visit: Payer: Self-pay | Admitting: *Deleted

## 2014-03-05 ENCOUNTER — Ambulatory Visit
Admission: RE | Admit: 2014-03-05 | Discharge: 2014-03-05 | Disposition: A | Discharge status: Home / Self Care | Payer: No Typology Code available for payment source | Source: Ambulatory Visit | Attending: Radiation Oncology | Admitting: Radiation Oncology

## 2014-03-05 ENCOUNTER — Ambulatory Visit: Payer: No Typology Code available for payment source | Admitting: Pharmacist

## 2014-03-05 ENCOUNTER — Other Ambulatory Visit (HOSPITAL_BASED_OUTPATIENT_CLINIC_OR_DEPARTMENT_OTHER): Payer: No Typology Code available for payment source

## 2014-03-05 ENCOUNTER — Other Ambulatory Visit: Payer: Self-pay | Admitting: Oncology

## 2014-03-05 ENCOUNTER — Encounter: Payer: Self-pay | Admitting: *Deleted

## 2014-03-05 DIAGNOSIS — C541 Malignant neoplasm of endometrium: Secondary | ICD-10-CM

## 2014-03-05 DIAGNOSIS — C787 Secondary malignant neoplasm of liver and intrahepatic bile duct: Secondary | ICD-10-CM

## 2014-03-05 DIAGNOSIS — C55 Malignant neoplasm of uterus, part unspecified: Secondary | ICD-10-CM

## 2014-03-05 DIAGNOSIS — I824Y9 Acute embolism and thrombosis of unspecified deep veins of unspecified proximal lower extremity: Secondary | ICD-10-CM

## 2014-03-05 DIAGNOSIS — I82401 Acute embolism and thrombosis of unspecified deep veins of right lower extremity: Secondary | ICD-10-CM

## 2014-03-05 DIAGNOSIS — C562 Malignant neoplasm of left ovary: Secondary | ICD-10-CM

## 2014-03-05 DIAGNOSIS — I82492 Acute embolism and thrombosis of other specified deep vein of left lower extremity: Secondary | ICD-10-CM

## 2014-03-05 DIAGNOSIS — Z510 Encounter for antineoplastic radiation therapy: Secondary | ICD-10-CM | POA: Diagnosis not present

## 2014-03-05 LAB — COMPREHENSIVE METABOLIC PANEL (CC13)
ALK PHOS: 87 U/L (ref 40–150)
ALT: 33 U/L (ref 0–55)
AST: 36 U/L — AB (ref 5–34)
Albumin: 3.7 g/dL (ref 3.5–5.0)
Anion Gap: 9 mEq/L (ref 3–11)
BUN: 17 mg/dL (ref 7.0–26.0)
CHLORIDE: 104 meq/L (ref 98–109)
CO2: 27 mEq/L (ref 22–29)
Calcium: 9.5 mg/dL (ref 8.4–10.4)
Creatinine: 1.1 mg/dL (ref 0.6–1.1)
EGFR: 69 mL/min/{1.73_m2} — ABNORMAL LOW (ref >90)
Glucose: 89 mg/dl (ref 70–140)
POTASSIUM: 4.2 meq/L (ref 3.5–5.1)
SODIUM: 140 meq/L (ref 136–145)
Total Bilirubin: 0.67 mg/dL (ref 0.20–1.20)
Total Protein: 8.6 g/dL — ABNORMAL HIGH (ref 6.4–8.3)

## 2014-03-05 LAB — PROTIME-INR
INR: 4.5 — ABNORMAL HIGH (ref 2.00–3.50)
Protime: 54 Seconds — ABNORMAL HIGH (ref 10.6–13.4)

## 2014-03-05 LAB — CBC WITH DIFFERENTIAL/PLATELET
BASO%: 0.3 % (ref 0.0–2.0)
Basophils Absolute: 0 10*3/uL (ref 0.0–0.1)
EOS%: 0.3 % (ref 0.0–7.0)
Eosinophils Absolute: 0 10*3/uL (ref 0.0–0.5)
HCT: 30.9 % — ABNORMAL LOW (ref 34.8–46.6)
HGB: 10 g/dL — ABNORMAL LOW (ref 11.6–15.9)
LYMPH#: 0.7 10*3/uL — AB (ref 0.9–3.3)
LYMPH%: 17.4 % (ref 14.0–49.7)
MCH: 32 pg (ref 25.1–34.0)
MCHC: 32.3 g/dL (ref 31.5–36.0)
MCV: 98.9 fL (ref 79.5–101.0)
MONO#: 0.4 10*3/uL (ref 0.1–0.9)
MONO%: 9.9 % (ref 0.0–14.0)
NEUT#: 2.7 10*3/uL (ref 1.5–6.5)
NEUT%: 72.1 % (ref 38.4–76.8)
Platelets: 195 10*3/uL (ref 145–400)
RBC: 3.12 10*6/uL — AB (ref 3.70–5.45)
RDW: 17 % — ABNORMAL HIGH (ref 11.2–14.5)
WBC: 3.8 10*3/uL — ABNORMAL LOW (ref 3.9–10.3)

## 2014-03-05 NOTE — Progress Notes (Signed)
Medical Oncology  Patient FTKA MD and lab earlier today. RN spoke with her by phone, labs done now.   INR 4.5 today, on 10 mg coumadin daily. For PAC removal by IR on 12-21  Has IVC filter. Since she is doing well now from cancer, will DC coumadin now and will not restart after PAC out. RN to speak with patient to be sure no bleeding and to remind her to call if bleeding, and with coumadin instructions. POF done rescheduling my visit to Jan. RN to speak with radiology to see if they want INR repeated here prior to procedure, or if they will check at radiology. Cc this note to [facility] coumadin clinic pharmacist.  Godfrey Pick, MD.

## 2014-03-05 NOTE — Progress Notes (Signed)
HDR BRACHYTHERAPY  DIAGNOSIS:     ICD-9-CM ICD-10-CM   1. Endometrial cancer 182.0 C54.1     NARRATIVE: After planning was complete the patient was transferred to the high-dose-rate suite. She was placed in the supine position. The patient's custom vaginal cylinder was placed within the vaginal vault. This was affixed to the CT/MR stabilization plate to prevent slippage.  Verification simulation  A fiducial marker was placed within the vaginal cylinder. Patient then had an AP and lateral film obtained. This was compared to the patient's planning films showing accurate position of the vaginal cylinder for treatment.  High-dose-rate treatment  The remote afterloading catheter was affixed to the vaginal cylinder. The patient then proceeded to undergo her 4th high-dose-rate treatment. She was prescribed a dose of 6 Gy to be delivered to the proximal vaginal mucosal surface. This was achieved with a 3 cm diameter cylinder with a treatment length of 4 cm, 9 dwell positions. Total treatment time was 387.9 seconds. Patient tolerated the procedure well. After completion of her therapy a radiation survey was performed documenting return of the iridium source into the GammaMed safe.  -----------------------------------  Eppie Gibson, MD

## 2014-03-05 NOTE — Progress Notes (Signed)
Medical Oncology  Patient missed MD appointment today, did come for labs prior to RT later today. INR 4.5. For PAC to be removed by IR next week. Has IVC filter. RN to speak with her again to be sure no bleeding and to remind her to call if she has any significant bleeding.  RN to tell patient to stop coumadin now.  Since she has IVC filter and since she is doing well now from cancer, will not restart coumadin even after PAC is removed. I will let coumadin clinic know by this note.   Patient needs to be rescheduled to this MD.  POF done  L.Marko Plume, MD

## 2014-03-05 NOTE — Telephone Encounter (Signed)
per pof to sch pt appt-cld pt & left message w/appt time & date-mailed pt copy of sch

## 2014-03-05 NOTE — Progress Notes (Signed)
INR = 4.5 Dr. Marko Plume has instructed RN to have pt stop her Coumadin I did not see pt in clinic today- MD managed. PAC being removed next week. Will archive pt from [facility] Coumadin clinic. [name], Pharm.D., CPP 03/05/2014@1 :28 PM

## 2014-03-06 LAB — CA 125: CA 125: 4 U/mL (ref <35)

## 2014-03-08 ENCOUNTER — Telehealth: Payer: Self-pay | Admitting: *Deleted

## 2014-03-08 NOTE — Telephone Encounter (Signed)
CALLED PATIENT TO REMIND OF HDR TX. FOR 03-09-14 @ 9 AM, LVM FOR A RETURN CALL

## 2014-03-09 ENCOUNTER — Other Ambulatory Visit: Payer: Self-pay | Admitting: Radiology

## 2014-03-09 ENCOUNTER — Encounter: Payer: Self-pay | Admitting: Radiation Oncology

## 2014-03-09 ENCOUNTER — Ambulatory Visit
Admission: RE | Admit: 2014-03-09 | Discharge: 2014-03-09 | Disposition: A | Discharge status: Home / Self Care | Payer: No Typology Code available for payment source | Source: Ambulatory Visit | Attending: Radiation Oncology | Admitting: Radiation Oncology

## 2014-03-09 DIAGNOSIS — Z510 Encounter for antineoplastic radiation therapy: Secondary | ICD-10-CM | POA: Diagnosis not present

## 2014-03-09 DIAGNOSIS — C541 Malignant neoplasm of endometrium: Secondary | ICD-10-CM

## 2014-03-09 NOTE — Progress Notes (Signed)
HDR BRACHYTHERAPY  DIAGNOSIS:     ICD-9-CM ICD-10-CM   1. Endometrial cancer 182.0 C54.1     NARRATIVE: After planning was complete the patient was transferred to the high-dose-rate suite. She was placed in the supine position. The patient's custom vaginal cylinder was placed within the vaginal vault. This was affixed to the CT/MR stabilization plate to prevent slippage.  Verification simulation  A fiducial marker was placed within the vaginal cylinder. Patient then had an AP and lateral film obtained. This was compared to the patient's planning films showing accurate position of the vaginal cylinder for treatment.  High-dose-rate treatment  The remote afterloading catheter was affixed to the vaginal cylinder. The patient then proceeded to undergo her 5th and final high-dose-rate treatment. She was prescribed a dose of 5 Gy to be delivered to the proximal vaginal mucosal surface. This was achieved with a 3 cm diameter cylinder with a treatment length of 4 cm, 9 dwell positions. Total treatment time was 402.9 seconds. Patient tolerated the procedure well. After completion of her therapy a radiation survey was performed documenting return of the iridium source into the GammaMed safe.  PLAN: followup in 1 month. Vaginal Dilator and instructions given to patient.  -----------------------------------  Eppie Gibson, MD

## 2014-03-09 NOTE — Progress Notes (Signed)
Per Dr Isidore Moos, gave patient medium+ vaginal dilator, tube of Surgilube. Instructed her on proper use of dilator; teach back method used. Patient verbalized understanding.

## 2014-03-12 ENCOUNTER — Ambulatory Visit (HOSPITAL_COMMUNITY)
Admission: RE | Admit: 2014-03-12 | Discharge: 2014-03-12 | Disposition: A | Discharge status: Home / Self Care | Payer: No Typology Code available for payment source | Source: Ambulatory Visit | Attending: Oncology | Admitting: Oncology

## 2014-03-12 ENCOUNTER — Encounter (HOSPITAL_COMMUNITY): Payer: Self-pay

## 2014-03-12 DIAGNOSIS — Z452 Encounter for adjustment and management of vascular access device: Secondary | ICD-10-CM | POA: Diagnosis not present

## 2014-03-12 DIAGNOSIS — J45909 Unspecified asthma, uncomplicated: Secondary | ICD-10-CM | POA: Insufficient documentation

## 2014-03-12 DIAGNOSIS — I10 Essential (primary) hypertension: Secondary | ICD-10-CM | POA: Diagnosis not present

## 2014-03-12 DIAGNOSIS — Z86711 Personal history of pulmonary embolism: Secondary | ICD-10-CM | POA: Insufficient documentation

## 2014-03-12 DIAGNOSIS — C541 Malignant neoplasm of endometrium: Secondary | ICD-10-CM

## 2014-03-12 DIAGNOSIS — K219 Gastro-esophageal reflux disease without esophagitis: Secondary | ICD-10-CM | POA: Insufficient documentation

## 2014-03-12 DIAGNOSIS — Z8542 Personal history of malignant neoplasm of other parts of uterus: Secondary | ICD-10-CM | POA: Diagnosis not present

## 2014-03-12 DIAGNOSIS — Z86718 Personal history of other venous thrombosis and embolism: Secondary | ICD-10-CM | POA: Diagnosis not present

## 2014-03-12 DIAGNOSIS — Z8543 Personal history of malignant neoplasm of ovary: Secondary | ICD-10-CM | POA: Diagnosis not present

## 2014-03-12 LAB — CBC WITH DIFFERENTIAL/PLATELET
BASOS PCT: 0 % (ref 0–1)
Basophils Absolute: 0 10*3/uL (ref 0.0–0.1)
EOS ABS: 0 10*3/uL (ref 0.0–0.7)
Eosinophils Relative: 1 % (ref 0–5)
HEMATOCRIT: 27.9 % — AB (ref 36.0–46.0)
Hemoglobin: 9 g/dL — ABNORMAL LOW (ref 12.0–15.0)
LYMPHS ABS: 0.7 10*3/uL (ref 0.7–4.0)
LYMPHS PCT: 20 % (ref 12–46)
MCH: 32 pg (ref 26.0–34.0)
MCHC: 32.3 g/dL (ref 30.0–36.0)
MCV: 99.3 fL (ref 78.0–100.0)
MONO ABS: 0.3 10*3/uL (ref 0.1–1.0)
MONOS PCT: 9 % (ref 3–12)
Neutro Abs: 2.6 10*3/uL (ref 1.7–7.7)
Neutrophils Relative %: 70 % (ref 43–77)
Platelets: 156 10*3/uL (ref 150–400)
RBC: 2.81 MIL/uL — AB (ref 3.87–5.11)
RDW: 14.8 % (ref 11.5–15.5)
WBC: 3.6 10*3/uL — ABNORMAL LOW (ref 4.0–10.5)

## 2014-03-12 LAB — PROTIME-INR
INR: 1.11 (ref 0.00–1.49)
PROTHROMBIN TIME: 14.4 s (ref 11.6–15.2)

## 2014-03-12 MED ORDER — CEFAZOLIN SODIUM-DEXTROSE 2-3 GM-% IV SOLR
2.0000 g | Freq: Once | INTRAVENOUS | Status: AC
  Administered 2014-03-12: 2 g via INTRAVENOUS
  Filled 2014-03-12: qty 50

## 2014-03-12 MED ORDER — SODIUM CHLORIDE 0.9 % IV SOLN
INTRAVENOUS | Status: DC
  Administered 2014-03-12: 12:00:00 via INTRAVENOUS

## 2014-03-12 MED ORDER — MIDAZOLAM HCL 2 MG/2ML IJ SOLN
INTRAMUSCULAR | Status: AC | PRN
  Administered 2014-03-12: 2 mg via INTRAVENOUS

## 2014-03-12 MED ORDER — MIDAZOLAM HCL 2 MG/2ML IJ SOLN
INTRAMUSCULAR | Status: AC
  Filled 2014-03-12: qty 2

## 2014-03-12 MED ORDER — FENTANYL CITRATE 0.05 MG/ML IJ SOLN
INTRAMUSCULAR | Status: DC
Start: 2014-03-12 — End: 2014-03-12
  Filled 2014-03-12: qty 2

## 2014-03-12 MED ORDER — LIDOCAINE HCL 1 % IJ SOLN
INTRAMUSCULAR | Status: AC
  Filled 2014-03-12: qty 20

## 2014-03-12 NOTE — Procedures (Signed)
Successful removal of right chest portacath.  No immediate complication.   

## 2014-03-12 NOTE — H&P (Signed)
Chief Complaint: "I am here to have my port removed."  Referring Physician(s): Livesay,Lennis P  History of Present Illness: Caitlyn Higgins is a 48 y.o. female with endometrial cancer post treatment, follows with Dr. Marko Plume and has been scheduled today to have her port a catheter removed. She denies any difficulty with the port since placement in 03/2013. She denies any chest pain, shortness of breath or palpitations. She denies any active signs of bleeding or excessive bruising. She denies any recent fever or chills. The patient denies any history of sleep apnea or chronic oxygen use. She has previously tolerated sedation without complications. She has been on coumadin for RLE DVT and has held this since 12/14   Past Medical History  Diagnosis Date  . Reflux   . Hypertension   . Cancer     uterine carcinosarcoma  . DVT (deep venous thrombosis)     right leg 12'14  . Pulmonary emboli     12'14 denies any sob  . Elevated blood uric acid level     tx. Allopurinol  . GERD (gastroesophageal reflux disease)   . Transfusion history 07-26-13    12'14, 04-14-13(2 units), 06-09-13  . Anemia   . Family history of malignant neoplasm of breast   . Ovarian cancer 2015  . Asthma     endometrial ca    Past Surgical History  Procedure Laterality Date  . No past surgeries    . Portacath placement Right     rigth chest- remains  . Ivc Right     filter placed to prevent Pulmonary emboli  . Abdominal hysterectomy N/A 08/01/2013    Procedure: EXPLORATORY LAPAROTOMY/HYSTERECTOMY TOTAL ABDOMINAL;  Surgeon: Janie Morning, MD;  Location: [facility];  Service: Gynecology;  Laterality: N/A;  . Salpingoophorectomy Bilateral 08/01/2013    Procedure: BILATERAL SALPINGO OOPHORECTOMY/OMENTECTOMY ;  Surgeon: Janie Morning, MD;  Location: [facility];  Service: Gynecology;  Laterality: Bilateral;    Allergies: Review of patient's allergies indicates no known allergies.  Medications: Prior to Admission  medications   Medication Sig Start Date End Date Taking? Authorizing Provider  allopurinol (ZYLOPRIM) 100 MG tablet Take 1 tablet (100 mg total) by mouth daily. 12/14/13  Yes Lennis Marion Downer, MD  docusate sodium (COLACE) 100 MG capsule Take 100 mg by mouth daily as needed for mild constipation.  03/10/13  Yes Historical Provider, MD  esomeprazole (NEXIUM) 20 MG capsule Take 20 mg by mouth daily.    Yes Historical Provider, MD  ferrous fumarate (HEMOCYTE - 106 MG FE) 325 (106 FE) MG TABS tablet Take 1 tablet (106 mg of iron total) by mouth daily. 12/14/13  Yes Lennis Marion Downer, MD  levothyroxine (SYNTHROID, LEVOTHROID) 50 MCG tablet Take 1 tablet (50 mcg total) by mouth daily. 12/14/13  Yes Elayne Snare, MD  lidocaine (LIDODERM) 5 % Place 1 patch onto the skin daily as needed (For back pain.). Remove & Discard patch within 12 hours or as directed by MD   Yes Historical Provider, MD  loratadine (CLARITIN) 10 MG tablet Take 10 mg by mouth at bedtime.    Yes Historical Provider, MD  LORazepam (ATIVAN) 0.5 MG tablet Place 1-2 tablets under the tongue or swallow every 4-6 hours as needed for nausea.  Will make drowsy 05/15/13  Yes Lennis Marion Downer, MD  ondansetron (ZOFRAN-ODT) 4 MG disintegrating tablet Take 1-2 tablets (4-8 mg total) by mouth every 8 (eight) hours as needed for nausea or vomiting. 08/16/13  Yes Lennis P Livesay,  MD  OxyCODONE (OXYCONTIN) 15 mg T12A 12 hr tablet Take 1 tablet (15 mg total) by mouth every 12 (twelve) hours. 01/10/14  Yes Lennis Marion Downer, MD  Oxycodone HCl 10 MG TABS Take 10 mg by mouth every 4 (four) hours as needed (For pain.).   Yes Historical Provider, MD  lidocaine-prilocaine (EMLA) cream Apply 1 application topically as needed (port). 03/29/13   Lennis Marion Downer, MD  omeprazole (PRILOSEC) 40 MG capsule Take 1 capsule (40 mg total) by mouth daily. 01/10/14   Lennis Marion Downer, MD  Oxycodone HCl 10 MG TABS Take 1 tablet every 4-6 hours as needed for pain. 02/20/14   Dorothyann Gibbs, NP    Family History  Problem Relation Age of Onset  . Diabetes Mother   . Hypertension Mother   . Hypertension Father   . Diabetes Father   . Prostate cancer Father 68    deceased 55  . Breast cancer Maternal Aunt 74    deceased 9  . Prostate cancer Maternal Uncle     5 of 6 mat uncles with prostate cancer; deceased  . Breast cancer Paternal Aunt 35    deceased 26  . Stomach cancer Paternal Grandmother     deceased 45  . Breast cancer Maternal Aunt 75    deceased 17  . Breast cancer Cousin 37    negative BRCAplus, PALB2 2015  . Breast cancer Paternal Aunt 65    deceased 64  . Thyroid disease Neg Hx     History   Social History  . Marital Status: Single    Spouse Name: N/A    Number of Children: N/A  . Years of Education: N/A   Social History Main Topics  . Smoking status: Never Smoker   . Smokeless tobacco: None  . Alcohol Use: No     Comment: Quit  2 yrs ago-social in past  . Drug Use: No  . Sexual Activity: Not Currently   Other Topics Concern  . None   Social History Narrative    Review of Systems: A 12 point ROS discussed and pertinent positives are indicated in the HPI above.  All other systems are negative.  Review of Systems  Vital Signs: BP 123/81 mmHg  Pulse 72  Temp(Src) 97.8 F (36.6 C) (Oral)  Resp 16  SpO2   LMP 03/09/2013  Physical Exam  Constitutional: She is oriented to person, place, and time. No distress.  HENT:  Head: Normocephalic and atraumatic.  Neck: No tracheal deviation present.  Cardiovascular: Normal rate and regular rhythm.  Exam reveals no gallop and no friction rub.   No murmur heard. Pulmonary/Chest: Effort normal and breath sounds normal. No respiratory distress. She has no wheezes. She has no rales.  Abdominal: Soft. Bowel sounds are normal. She exhibits no distension. There is no tenderness.  Neurological: She is alert and oriented to person, place, and time.  Skin: She is not diaphoretic.  Right  anterior chest port intact with well healed scar  Psychiatric: She has a normal mood and affect. Her behavior is normal. Thought content normal.    Imaging: No results found.  Labs:  CBC:  Recent Labs  01/10/14 0841 01/25/14 1104 03/05/14 1155 03/12/14 1134  WBC 9.8 2.9* 3.8* 3.6*  HGB 8.3* 9.5* 10.0* 9.0*  HCT 25.3* 29.0* 30.9* 27.9*  PLT 84* 126* 195 156    COAGS:  Recent Labs  04/03/13 1230  07/26/13 1110  01/10/14 0841 01/25/14 1104 03/05/14 1155 03/12/14  1134  INR 1.03  < > 3.08*  < > 3.10 2.30 4.50* 1.11  APTT 35  --  61*  --   --   --   --   --   < > = values in this interval not displayed.  BMP:  Recent Labs  04/03/13 1230  07/26/13 1110  08/02/13 0505  12/07/13 1121 12/20/13 1141 01/10/14 0841 03/05/14 1156  NA 139  < > 140  < > 136*  < > 139 139 142 140  K 4.0  < > 4.4  < > 4.4  < > 4.5 4.3 3.5 4.2  CL 98  --  103  --  100  --  103  --   --   --   CO2 25  < > 29  --  25  < > _0 GLUCOSE 98  < > 100*  < > 130*  < > 133* 89 88 89  BUN 21  < > 33*  --  23  < > 26* 18.9 17.2 17.0  CALCIUM 9.7  < > 9.6  --  9.5  < > 10.0 9.1 8.9 9.5  CREATININE 1.33*  < > 1.29*  --  1.39*  < > 0.90 1.0 1.0 1.1  GFRNONAA 46*  --  48*  --  44*  --   --   --   --   --   GFRAA 53*  --  55*  --  51*  --   --   --   --   --   < > = values in this interval not displayed.  LIVER FUNCTION TESTS:  Recent Labs  12/07/13 1121 12/20/13 1141 01/10/14 0841 03/05/14 1156  BILITOT 0.4 0.27 0.23 0.67  AST _1 36*  ALT _2 33  ALKPHOS 98 110 101 87  PROT 9.6* 8.6* 8.2 8.6*  ALBUMIN 4.0 3.7 3.6 3.7    Assessment and Plan: Endometrial cancer Right sided port placed 03/2013 finished treatment Scheduled today for right sided port a catheter removal with moderate sedation Patient has been NPO, coumadin held since 12/14, labs reviewed Risks and Benefits discussed with the patient. All of the patient's questions were answered, patient is agreeable to  proceed. Consent signed and in chart. History of RLE DVT coumadin held since 12/14 History of IVC filter    Signed: Hedy Jacob 03/12/2014, 12:10 PM

## 2014-03-26 ENCOUNTER — Other Ambulatory Visit: Payer: Self-pay | Admitting: Endocrinology

## 2014-04-01 ENCOUNTER — Other Ambulatory Visit: Payer: Self-pay | Admitting: Oncology

## 2014-04-01 DIAGNOSIS — C562 Malignant neoplasm of left ovary: Secondary | ICD-10-CM

## 2014-04-01 DIAGNOSIS — C541 Malignant neoplasm of endometrium: Secondary | ICD-10-CM

## 2014-04-03 ENCOUNTER — Telehealth: Payer: Self-pay | Admitting: *Deleted

## 2014-04-03 ENCOUNTER — Ambulatory Visit (HOSPITAL_COMMUNITY)
Admission: RE | Admit: 2014-04-03 | Discharge: 2014-04-03 | Disposition: A | Discharge status: Home / Self Care | Payer: No Typology Code available for payment source | Source: Ambulatory Visit | Attending: Oncology | Admitting: Oncology

## 2014-04-03 DIAGNOSIS — C569 Malignant neoplasm of unspecified ovary: Secondary | ICD-10-CM

## 2014-04-03 DIAGNOSIS — I82409 Acute embolism and thrombosis of unspecified deep veins of unspecified lower extremity: Secondary | ICD-10-CM

## 2014-04-03 DIAGNOSIS — Z86718 Personal history of other venous thrombosis and embolism: Secondary | ICD-10-CM | POA: Diagnosis present

## 2014-04-03 NOTE — Progress Notes (Signed)
Left lower extremity venous duplex completed.  Left:  No evidence of DVT, superficial thrombosis, or Baker's cyst.  Right:  Negative for DVT in the common femoral vein.  

## 2014-04-03 NOTE — Telephone Encounter (Signed)
Received call from [place] at [facility] vascular lab that LLE venous doppler was negative and she let patient know. Dr. Marko Plume notified of results. Per Dr. Marko Plume, patient can take aleve or ibuprofen as needed with food for the tenderness in her thigh. Patient called and agreeable to this. Reminded her of appt times for lab and MD visit on 04/05/14 and told patient to call us tomorrow if there is a change in symptoms or if the tenderness get worse. Patient agreeable to this.

## 2014-04-03 NOTE — Telephone Encounter (Signed)
Pt left VM stating she has had left thigh tenderness for the last 2 days. Denies swelling, tenderness, or redness in thigh. She states it is not painful, "just tender." Per Dr. Marko Plume order placed for LLE venous doppler. Scheduled at [facility] today at [place] patient back and she is agreeable to that appointment time.

## 2014-04-05 ENCOUNTER — Ambulatory Visit (HOSPITAL_BASED_OUTPATIENT_CLINIC_OR_DEPARTMENT_OTHER): Payer: No Typology Code available for payment source | Admitting: Oncology

## 2014-04-05 ENCOUNTER — Encounter: Payer: Self-pay | Admitting: Oncology

## 2014-04-05 ENCOUNTER — Other Ambulatory Visit (HOSPITAL_BASED_OUTPATIENT_CLINIC_OR_DEPARTMENT_OTHER): Payer: No Typology Code available for payment source

## 2014-04-05 ENCOUNTER — Telehealth: Payer: Self-pay | Admitting: Oncology

## 2014-04-05 VITALS — BP 150/74 | HR 87 | Temp 98.0°F | Resp 18 | Ht 67.0 in | Wt 256.5 lb

## 2014-04-05 DIAGNOSIS — C55 Malignant neoplasm of uterus, part unspecified: Secondary | ICD-10-CM

## 2014-04-05 DIAGNOSIS — C562 Malignant neoplasm of left ovary: Secondary | ICD-10-CM

## 2014-04-05 DIAGNOSIS — D6481 Anemia due to antineoplastic chemotherapy: Secondary | ICD-10-CM

## 2014-04-05 DIAGNOSIS — J069 Acute upper respiratory infection, unspecified: Secondary | ICD-10-CM

## 2014-04-05 DIAGNOSIS — C541 Malignant neoplasm of endometrium: Secondary | ICD-10-CM

## 2014-04-05 DIAGNOSIS — C787 Secondary malignant neoplasm of liver and intrahepatic bile duct: Secondary | ICD-10-CM

## 2014-04-05 DIAGNOSIS — I82401 Acute embolism and thrombosis of unspecified deep veins of right lower extremity: Secondary | ICD-10-CM

## 2014-04-05 DIAGNOSIS — T451X5A Adverse effect of antineoplastic and immunosuppressive drugs, initial encounter: Secondary | ICD-10-CM

## 2014-04-05 LAB — COMPREHENSIVE METABOLIC PANEL (CC13)
ALBUMIN: 3.8 g/dL (ref 3.5–5.0)
ALK PHOS: 93 U/L (ref 40–150)
ALT: 12 U/L (ref 0–55)
AST: 20 U/L (ref 5–34)
Anion Gap: 9 mEq/L (ref 3–11)
BUN: 22.5 mg/dL (ref 7.0–26.0)
CHLORIDE: 105 meq/L (ref 98–109)
CO2: 25 mEq/L (ref 22–29)
Calcium: 9.5 mg/dL (ref 8.4–10.4)
Creatinine: 1.3 mg/dL — ABNORMAL HIGH (ref 0.6–1.1)
EGFR: 57 mL/min/{1.73_m2} — ABNORMAL LOW (ref >90)
Glucose: 93 mg/dl (ref 70–140)
Potassium: 3.8 mEq/L (ref 3.5–5.1)
Sodium: 139 mEq/L (ref 136–145)
TOTAL PROTEIN: 9.1 g/dL — AB (ref 6.4–8.3)
Total Bilirubin: 0.47 mg/dL (ref 0.20–1.20)

## 2014-04-05 LAB — CBC WITH DIFFERENTIAL/PLATELET
BASO%: 0.4 % (ref 0.0–2.0)
Basophils Absolute: 0 10*3/uL (ref 0.0–0.1)
EOS%: 0.7 % (ref 0.0–7.0)
Eosinophils Absolute: 0 10*3/uL (ref 0.0–0.5)
HCT: 31.9 % — ABNORMAL LOW (ref 34.8–46.6)
HEMOGLOBIN: 10.5 g/dL — AB (ref 11.6–15.9)
LYMPH#: 1.1 10*3/uL (ref 0.9–3.3)
LYMPH%: 25.1 % (ref 14.0–49.7)
MCH: 32.6 pg (ref 25.1–34.0)
MCHC: 32.8 g/dL (ref 31.5–36.0)
MCV: 99.2 fL (ref 79.5–101.0)
MONO#: 0.5 10*3/uL (ref 0.1–0.9)
MONO%: 11 % (ref 0.0–14.0)
NEUT#: 2.7 10*3/uL (ref 1.5–6.5)
NEUT%: 62.8 % (ref 38.4–76.8)
Platelets: 169 10*3/uL (ref 145–400)
RBC: 3.21 10*6/uL — ABNORMAL LOW (ref 3.70–5.45)
RDW: 15.5 % — ABNORMAL HIGH (ref 11.2–14.5)
WBC: 4.3 10*3/uL (ref 3.9–10.3)
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
## 2014-04-06 ENCOUNTER — Encounter: Payer: Self-pay | Admitting: *Deleted

## 2014-04-06 LAB — CA 125: CA 125: 4 U/mL (ref <35)

## 2014-04-07 ENCOUNTER — Other Ambulatory Visit: Payer: Self-pay | Admitting: Oncology

## 2014-04-07 DIAGNOSIS — C541 Malignant neoplasm of endometrium: Secondary | ICD-10-CM

## 2014-04-07 DIAGNOSIS — C562 Malignant neoplasm of left ovary: Secondary | ICD-10-CM

## 2014-04-09 ENCOUNTER — Telehealth: Payer: Self-pay | Admitting: Oncology

## 2014-04-09 NOTE — Telephone Encounter (Signed)
lvm for pt regardign to Feb appt....mailed pt appt sched and letter

## 2014-04-10 ENCOUNTER — Telehealth: Payer: Self-pay | Admitting: *Deleted

## 2014-04-10 NOTE — Progress Notes (Signed)
[facility] End of Treatment Note  Name:Caitlyn Higgins  Date: 03/09/2014 TMH:[id number] DOB:Jan 24, 1966   Status:outpatient     REFERRING PHYSICIAN:   Janie Morning, MD   DIAGNOSIS: Stage II uterine carcinosarcoma     INDICATION FOR TREATMENT: Curative   TREATMENT DATES:  12/2, 12/7, 12/11, 12/14 and 03/09/14                         SITE/DOSE:   Vaginal Cuff 4 cm treatment length  / 30 Gy in 5 fractions                       Technique:   HDR with Ir-192                 NARRATIVE:   She tolerated treatment well.                         PLAN: Routine followup in one month. Patient instructed to call if questions or worsening complaints in interim.  She was given a vaginal dilator and instructed on how to use this to reduce risk of stenosis.  -----------------------------------  Eppie Gibson, MD

## 2014-04-10 NOTE — Telephone Encounter (Signed)
Called and notified patient of kidney function as noted below by Dr. Marko Plume. Patient agreeable to increase PO fluids. Also, let patient know that Dr. Marko Plume added on a lab appointment 05/03/14 at 10am before she sees Dr. Skeet Latch. Patient agreeable to this.

## 2014-04-10 NOTE — Telephone Encounter (Signed)
-----   Message from Gordy Levan, MD sent at 04/07/2014  2:00 PM EST ----- Please let her know kidney function was not quite as good as her usual on labs 1-14. Needs to drink lots of fluids. Will recheck when she sees Dr Skeet Latch in Feb if PCP does not do labs prior. POF sent.  thanks

## 2014-04-11 ENCOUNTER — Telehealth: Payer: Self-pay | Admitting: *Deleted

## 2014-04-11 NOTE — Telephone Encounter (Signed)
Call to TRIAGE from pt stating she is scheduled for follow up with Rad Onc on 1/22 and needs to have a TB test for work.  She is inquiring if above could be done by this office.  This RN informed her TB testing is not done here but can be obtained at the [facility] or her primary care office.  No other needs.

## 2014-04-13 ENCOUNTER — Ambulatory Visit
Admission: RE | Admit: 2014-04-13 | Discharge: 2014-04-13 | Disposition: A | Discharge status: Home / Self Care | Payer: No Typology Code available for payment source | Source: Ambulatory Visit | Attending: Radiation Oncology | Admitting: Radiation Oncology

## 2014-04-13 DIAGNOSIS — C541 Malignant neoplasm of endometrium: Secondary | ICD-10-CM

## 2014-04-27 ENCOUNTER — Encounter: Payer: Self-pay | Admitting: Radiation Oncology

## 2014-04-27 ENCOUNTER — Ambulatory Visit
Admission: RE | Admit: 2014-04-27 | Discharge: 2014-04-27 | Disposition: A | Discharge status: Home / Self Care | Payer: No Typology Code available for payment source | Source: Ambulatory Visit | Attending: Radiation Oncology | Admitting: Radiation Oncology

## 2014-04-27 ENCOUNTER — Other Ambulatory Visit: Payer: Self-pay | Admitting: Oncology

## 2014-04-27 ENCOUNTER — Telehealth: Payer: Self-pay | Admitting: *Deleted

## 2014-04-27 VITALS — BP 138/70 | HR 88 | Temp 97.7°F | Resp 20 | Wt 266.2 lb

## 2014-04-27 DIAGNOSIS — C541 Malignant neoplasm of endometrium: Secondary | ICD-10-CM

## 2014-04-27 DIAGNOSIS — E790 Hyperuricemia without signs of inflammatory arthritis and tophaceous disease: Secondary | ICD-10-CM

## 2014-04-27 NOTE — Progress Notes (Signed)
Radiation Oncology         [phone] ________________________________  Name: Caitlyn Higgins MRN: [id number]  Date: 04/27/2014  DOB: 13-Dec-1965  Follow-Up Visit Note  Outpatient  CC: Shirline Frees, MD  Janie Morning, MD  Diagnosis and Prior Radiotherapy:    ICD-9-CM ICD-10-CM   1. Endometrial cancer 182.0 C54.1     DIAGNOSIS: Stage II uterine carcinosarcoma    INDICATION FOR TREATMENT: Curative  TREATMENT DATES:  12/2, 12/7, 12/11, 12/14 and 03/09/14                     SITE/DOSE: Vaginal Cuff 4 cm treatment length  / 30 Gy in 5 fractions                     Technique:   HDR with Ir-192                                          Narrative:  The patient returns today for routine follow-up.    Patient denies pain, vaginal discharge, urinary or bowel issues, fatigue, loss of appetite. She states she had no skin irritation following her treatments. She is using the vaginal dilator 3 times weekly x 10 minutes each time. She has labs and FU with Dr Skeet Latch on 05/03/14.  ALLERGIES:  has No Known Allergies.  Meds: Current Outpatient Prescriptions  Medication Sig Dispense Refill  . allopurinol (ZYLOPRIM) 100 MG tablet Take 1 tablet (100 mg total) by mouth daily. 30 tablet 0  . docusate sodium (COLACE) 100 MG capsule Take 100 mg by mouth daily as needed for mild constipation.     Marland Kitchen esomeprazole (NEXIUM) 20 MG capsule Take 20 mg by mouth daily.     . ferrous fumarate (HEMOCYTE - 106 MG FE) 325 (106 FE) MG TABS tablet Take 1 tablet (106 mg of iron total) by mouth daily. 30 each 0  . levothyroxine (SYNTHROID, LEVOTHROID) 50 MCG tablet TAKE 1 TABLET BY MOUTH ONCE DAILY 30 tablet 2  . lidocaine (LIDODERM) 5 % Place 1 patch onto the skin daily as needed (For back pain.). Remove & Discard patch within 12 hours or as directed by MD    . loratadine (CLARITIN) 10 MG tablet Take 10 mg by mouth at bedtime.     Marland Kitchen omeprazole (PRILOSEC) 40 MG capsule Take 1 capsule (40 mg total) by mouth  daily. 90 capsule 2  . allopurinol (ZYLOPRIM) 100 MG tablet TAKE 1 TABLET BY MOUTH DAILY. 30 tablet 0   No current facility-administered medications for this encounter.    Physical Findings: The patient is in no acute distress. Patient is alert and oriented.  weight is 266 lb 3.2 oz (120.748 kg). Her oral temperature is 97.7 F (36.5 C). Her blood pressure is 138/70 and her pulse is 88. Her respiration is 20. Marland Kitchen    GYN exam: external genitalia unremarkable. No sign of bleeding or recurrence along vaginal vault upon digital and speculum exam.  Lab Findings: Lab Results  Component Value Date   WBC 4.3 04/05/2014   HGB 10.5* 04/05/2014   HCT 31.9* 04/05/2014   MCV 99.2 04/05/2014   PLT 169 04/05/2014    Radiographic Findings: No results found.  Impression/Plan:  Doing well. F/u with GYN ONC next week. F/u with me in 25mo, sooner if needed.   _____________________________________   Eppie Gibson, MD

## 2014-04-27 NOTE — Telephone Encounter (Signed)
Pt notified by voicemail.

## 2014-04-27 NOTE — Telephone Encounter (Signed)
Patient has not arrived for her 3:20 pm FU. Left vm on her phone requesting a call back. Left call back name and number.

## 2014-04-27 NOTE — Progress Notes (Signed)
Patient denies pain, vaginal discharge, urinary or bowel issues, fatigue, loss of appetite. She states she had no skin irritation following her treatments. She is using the vaginal dilator 3 times weekly x 10 minutes each time. She has labs and FU with Dr Skeet Latch on 05/03/14.

## 2014-05-02 ENCOUNTER — Telehealth: Payer: Self-pay | Admitting: Gynecologic Oncology

## 2014-05-02 NOTE — Telephone Encounter (Signed)
Office Visit:  GYN ONCOLOGY  Chief Complaint:  Uterine carcinosarcoma, ovarian clear cell cancer  Assessment/Plan  [name] is a 49 y.o. 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  Physical examination was notable for cervical involvement.   She was S/P IVC filter. [name] received 4 cycles of chemotherapy with resolution of the cervical lesion and no further bleeding.  Final pathology c/w two primary malignancies.  Predominantly clear cell cancer arising in the left ovarian endometriotic cyst and primary uterine carcinosarcoma. Continue anticoagulation until radiation therapy is complete. Radiation oncology consult. Follow-up in 3 months  Thyroid nodule. Endocrinology managing   History of Present Illness  Caitlyn Higgins is a 49 y.o. who presented with a  pelvic mass, to [facility] and was transferred to [facility] 02/23/2013.   The patient feels that she was in her usual state of health until a few days prior to presentation when she noted abdominal distention and bloating. She presented to her gastroenterologist and was sent for a CT scan.  The CT 02/22/2013 demonstrated " 1. 24.1 x 16.8 x 19.9 cm complex cystic mass which appears to arise from the pelvis extending into the mid upper abdomen is highly suspicious for an ovarian neoplasm. There is evidence of early omental disease, as well as a serosal implant upon the surface of the liver, with small volume of probable malignant ascites, and potential transdiaphragmatic extension based on small lymph nodes immediately above the diaphragm.  2. Importantly, this pelvic mass is causing severe compression of the proximal inferior vena cava, and there is a large volume of deep venous thrombosis in the visualized portion of the right thigh  involving right superficial femoral, profunda femoral and common femoral veins. Additionally, there is a mass like peripheral wedge-shaped opacity in the right lower lobe. In the setting of deep venous thrombosis, this wedge-shaped opacity would be most concerning for potential pulmonary infarction. Associated with this, there is a trace right pleural effusion. 3. 5.2 x 1.8 cm heterogeneously enhancing lesion  associated with the liver capsule overlying the right lobe of the  liver "  She was transferred to [facility].    IVC filter was placed and she was started on Lovenox.  She was subsequently transitioned to warfarin.   In December 2014 an endometrial biopsy was collected. Pathology :  1. Cervix, biopsy - HIGH GRADE ENDOMETRIAL CARCINOMA, SEE COMMENT. 2. Endometrium, biopsy - HIGH GRADE ENDOMETRIAL CARCINOMA, SEE COMMENT. 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  [name]  received the 4th cycle of Taxol carboplatin therapy on 05/24/2013. She underwent interval debulking TAH BSO omentectomy 08/01/2013  Specimen(s): Left ovary and fallopian tube.  Left salpingo-oophorectomy Primary tumor site (including laterality): Left ovary Ovarian surface involvement: Present. Ovarian capsule intact without fragmentation: No. Maximum tumor size (cm): 20 cm, gross measurement. Histologic type: Mixed surface epithelial carcinoma with predominant clear cell carcinoma component (70%) and minor endometrioid component (30%) Please see comment for detail. Grade: High grade  TNM code: ypT1c2, pNX FIGO Stage (based on pathologic findings, needs clinical correlation): at least IC2 Comments: Received grossly is a 20 cm focally disrupted and partially collapsed multicystic ovary with attached/adherent omentum. Microscopically, sectioned  show an invasive high grade surface carcinoma arising in a background of endometriotic cyst. The tumor is predominately composed of clear cell carcinoma component with minor endometrioid carcinoma component. No tumor involvement is identified in the adherent omentum tissue. Immunohistochemical stains were performed and the clear cell carcinoma is patchy positive for p16, ER, p53, negative for WT-1 and p63. Focal angiolymphatic invasion is present.   The morphologic features are different from those seen in the uterus.  2. ONCOLOGY TABLE-UTERUS, CARCINOSARCOMA Specimen: Uterus, cervix and right fallopian tubes and ovaries. Procedure: Total hysterectomy and right salpingo-oophorectomy. Lymph node sampling performed: No. Specimen integrity: Intact. Maximum tumor size: Multiple microscopic foci, measuring up to 0.5 cm in greatest dimension. Histologic type: Carcinosarcoma (MMMT) with heterologous component (ossification) Grade: High grade Myometrial invasion: 2.1 cm where myometrium is 2.5 cm in thickness Cervical stromal involvement: No. Extent of involvement of other organs: No. Lymph - vascular invasion: Not identified.  FIGO Stage (based on pathologic findings, needs clinical correlation): IB Comment: Immunohistochemical stains were performed and the residual carcinoma is positive for p16, negative for WT-1, weakly positive for p53 and ER. The morphologic features are different from those seen in the left ovary. (HCL:gt, 08/04/13)   She has had total of 9 cycles of carboplatin and taxol from 03-14-14 thru 01-04-14.   She has not had radiation.  CT 01/22/2014 IMPRESSION: 1. Postoperative changes of hysterectomy and left salpingo-oophorectomy without evidence of recurrent or metastatic disease. 2. Scattered lymph nodes and small omental nodules are unchanged  Genetic testing negative  Past Medical History  Diagnosis Date  . Reflux   . Hypertension   . Cancer     uterine  carcinosarcoma  . DVT (deep venous thrombosis)     right leg 12'14  . Pulmonary emboli     12'14 denies any sob  . Elevated blood uric acid level     tx. Allopurinol  . GERD (gastroesophageal reflux disease)   . Transfusion history 07-26-13    12'14, 04-14-13(2 units), 06-09-13  . Anemia   . Family history of malignant neoplasm of breast   . Ovarian cancer 2015  . Asthma     endometrial ca  . Hx of radiation therapy HDR    x 5 tx   Past Surgical History  Procedure Laterality Date  . No past surgeries    . Portacath placement Right     rigth chest- remains  . Ivc Right     filter placed to prevent Pulmonary emboli  . Abdominal hysterectomy N/A 08/01/2013    Procedure: EXPLORATORY LAPAROTOMY/HYSTERECTOMY TOTAL ABDOMINAL;  Surgeon: Janie Morning, MD;  Location: [facility];  Service: Gynecology;  Laterality: N/A;  . Salpingoophorectomy Bilateral 08/01/2013    Procedure: BILATERAL SALPINGO OOPHORECTOMY/OMENTECTOMY ;  Surgeon: Janie Morning, MD;  Location: [facility];  Service: Gynecology;  Laterality: Bilateral;     GYN History G0 Hx of Abnormal Pap Smears: no, last pap ~2010  Hx of STIs: no  Never had regular periods.   Social Hx:   Getting married in April. Already has her wedding dress.  Family History  several second degree relatives with breast cancer.  Review of Systems  Reports recent weight gain.  Denies cough, no hemoptysis, reports intermittent nausea she reports constipation. No vaginal bleeding, no rectal bleeding. No dysphagia, no vaginal bleeding, otherwise ROS negative  Physical Exam  BP 130/67 mmHg  Pulse 76  Temp(Src) 98 F (36.7 C) (Oral)  Resp 18  Ht '5\' 7"'  (1.702 m)  Wt 233  lb 6.4 oz (105.87 kg)  BMI 36.55 kg/m2  LMP 03/09/2013 Wt Readings from Last 3 Encounters:  04/05/14 256 lb 8 oz (116.348 kg)  02/02/14 244 lb 8 oz (110.904 kg)  01/25/14 233 lb 6.4 oz (105.87 kg)   General: No acute distress.  HEENT: Extra occular muscles grossly intact.  Pulmonary:  CTAB. Normal work of breathing.  Cardiovascular: RRR.  Abdomen: Soft NT.  Midline incision well healed.  No masses or hernia.  No rebound/guarding.  Musculoskeletal: Grossly normal ROM.  Extremities: Warm and well-perfused. 1+ pitting edema  Neurological: Alert and oriented x3.  Psychological: Appropriate mood/affect.  Genitourinary:  Nl EGBUS, Vagina without bleeding or discharge.  Cuff intact.  No cul de sac masses. Breasts:  Pendulous, no masses, no dimpling no retractions no nipple discharge. LN:  No cervical supraclavicular or inguinal adenopathy.

## 2014-05-03 ENCOUNTER — Ambulatory Visit: Payer: No Typology Code available for payment source | Attending: Gynecologic Oncology | Admitting: Gynecologic Oncology

## 2014-05-03 ENCOUNTER — Other Ambulatory Visit (HOSPITAL_BASED_OUTPATIENT_CLINIC_OR_DEPARTMENT_OTHER): Payer: No Typology Code available for payment source

## 2014-05-03 ENCOUNTER — Encounter: Payer: Self-pay | Admitting: Gynecologic Oncology

## 2014-05-03 VITALS — BP 148/81 | HR 75 | Temp 97.1°F | Resp 18 | Ht 67.0 in | Wt 266.6 lb

## 2014-05-03 DIAGNOSIS — C569 Malignant neoplasm of unspecified ovary: Secondary | ICD-10-CM | POA: Diagnosis not present

## 2014-05-03 DIAGNOSIS — Z8543 Personal history of malignant neoplasm of ovary: Secondary | ICD-10-CM | POA: Diagnosis not present

## 2014-05-03 DIAGNOSIS — C562 Malignant neoplasm of left ovary: Secondary | ICD-10-CM

## 2014-05-03 DIAGNOSIS — C55 Malignant neoplasm of uterus, part unspecified: Secondary | ICD-10-CM | POA: Insufficient documentation

## 2014-05-03 DIAGNOSIS — Z8542 Personal history of malignant neoplasm of other parts of uterus: Secondary | ICD-10-CM

## 2014-05-03 DIAGNOSIS — D6481 Anemia due to antineoplastic chemotherapy: Secondary | ICD-10-CM

## 2014-05-03 DIAGNOSIS — C541 Malignant neoplasm of endometrium: Secondary | ICD-10-CM

## 2014-05-03 DIAGNOSIS — C787 Secondary malignant neoplasm of liver and intrahepatic bile duct: Secondary | ICD-10-CM

## 2014-05-03 LAB — BASIC METABOLIC PANEL (CC13)
ANION GAP: 10 meq/L (ref 3–11)
BUN: 18.4 mg/dL (ref 7.0–26.0)
CALCIUM: 9.2 mg/dL (ref 8.4–10.4)
CO2: 26 mEq/L (ref 22–29)
Chloride: 106 mEq/L (ref 98–109)
Creatinine: 1.1 mg/dL (ref 0.6–1.1)
EGFR: 71 mL/min/{1.73_m2} — AB (ref >90)
Glucose: 81 mg/dl (ref 70–140)
POTASSIUM: 3.7 meq/L (ref 3.5–5.1)
SODIUM: 143 meq/L (ref 136–145)

## 2014-05-03 NOTE — Progress Notes (Signed)
Office Visit:  GYN ONCOLOGY  Chief Complaint:  Uterine carcinosarcoma, ovarian clear cell cancer  Assessment/Plan  [name] is a 49 y.o. 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  Physical examination was notable for cervical involvement.   She was S/P IVC filter. [name] received 4 cycles of chemotherapy with resolution of the cervical lesion and no further bleeding.  Final pathology c/w two primary malignancies.  Predominantly clear cell cancer arising in the left ovarian endometriotic cyst and primary uterine carcinosarcoma. Chemotherapy completed  01/04/2014.  Vaginal cuff bracytherapy completed 03/09/2014.  Follow-up in 3 months No evidence of disease Repeat CT chest/abd/pelvis 08/2014  History of Present Illness  Caitlyn Higgins is a 49 y.o. who presented with a  pelvic mass, to [facility] and was transferred to [facility] 02/23/2013.   The patient feels that she was in her usual state of health until a few days prior to presentation when she noted abdominal distention and bloating. She presented to her gastroenterologist and was sent for a CT scan.  The CT 02/22/2013 demonstrated " 1. 24.1 x 16.8 x 19.9 cm complex cystic mass which appears to arise from the pelvis extending into the mid upper abdomen is highly suspicious for an ovarian neoplasm. There is evidence of early omental disease, as well as a serosal implant upon the surface of the liver, with small volume of probable malignant ascites, and potential transdiaphragmatic extension based on small lymph nodes immediately above the diaphragm.  2. Importantly, this pelvic mass is causing severe compression of the proximal inferior vena cava, and there is a large volume of deep venous thrombosis in the visualized portion of the right  thigh involving right superficial femoral, profunda femoral and common femoral veins. Additionally, there is a mass like peripheral wedge-shaped opacity in the right lower lobe. In the setting of deep venous thrombosis, this wedge-shaped opacity would be most concerning for potential pulmonary infarction. Associated with this, there is a trace right pleural effusion. 3. 5.2 x 1.8 cm heterogeneously enhancing lesion  associated with the liver capsule overlying the right lobe of the  liver "  She was transferred to [facility].    IVC filter was placed and she was started on Lovenox.  She was subsequently transitioned to warfarin.   In December 2014 an endometrial biopsy was collected. Pathology :  1. Cervix, biopsy - HIGH GRADE ENDOMETRIAL CARCINOMA, SEE COMMENT. 2. Endometrium, biopsy - HIGH GRADE ENDOMETRIAL CARCINOMA, SEE COMMENT. 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  [name]  received the 4th cycle of Taxol carboplatin therapy on 05/24/2013. She underwent interval debulking TAH BSO omentectomy 08/01/2013  Specimen(s): Left ovary and fallopian tube.  Left salpingo-oophorectomy Primary tumor site (including laterality): Left ovary Ovarian surface involvement: Present. Ovarian capsule intact without fragmentation: No. Maximum tumor size (cm): 20 cm, gross measurement. Histologic type: Mixed surface epithelial carcinoma with predominant clear cell carcinoma component (70%) and minor endometrioid component (30%) Please see comment for detail. Grade: High grade  TNM code: ypT1c2, pNX FIGO Stage (based on pathologic findings, needs clinical correlation): at least IC2 Comments: Received grossly is a 20 cm focally disrupted and partially collapsed multicystic ovary with attached/adherent omentum.  Microscopically,  sectioned show an invasive high grade surface carcinoma arising in a background of endometriotic cyst. The tumor is predominately composed of clear cell carcinoma component with minor endometrioid carcinoma component. No tumor involvement is identified in the adherent omentum tissue. Immunohistochemical stains were performed and the clear cell carcinoma is patchy positive for p16, ER, p53, negative for WT-1 and p63. Focal angiolymphatic invasion is present.   The morphologic features are different from those seen in the uterus.  2. ONCOLOGY TABLE-UTERUS, CARCINOSARCOMA Specimen: Uterus, cervix and right fallopian tubes and ovaries. Procedure: Total hysterectomy and right salpingo-oophorectomy. Lymph node sampling performed: No. Specimen integrity: Intact. Maximum tumor size: Multiple microscopic foci, measuring up to 0.5 cm in greatest dimension. Histologic type: Carcinosarcoma (MMMT) with heterologous component (ossification) Grade: High grade Myometrial invasion: 2.1 cm where myometrium is 2.5 cm in thickness Cervical stromal involvement: No. Extent of involvement of other organs: No. Lymph - vascular invasion: Not identified.  FIGO Stage (based on pathologic findings, needs clinical correlation): IB Comment: Immunohistochemical stains were performed and the residual carcinoma is positive for p16, negative for WT-1, weakly positive for p53 and ER. The morphologic features are different from those seen in the left ovary. (HCL:gt, 08/04/13)   She has had total of 9 cycles of carboplatin and taxol thru 01-04-14.    CT 01/22/2014 IMPRESSION: 1. Postoperative changes of hysterectomy and left salpingo-oophorectomy without evidence of recurrent or metastatic disease. 2. Scattered lymph nodes and small omental nodules are unchanged  Received vaginal brachytherapy TREATMENT DATES: 12/2, 12/7, 12/11, 12/14 and 03/09/14  SITE/DOSE: Vaginal Cuff 4 cm  treatment length / 30 Gy in 5 fractions  Technique: HDR with Ir-192  Genetic testing negative  Is doing very well. Reports weight gain, anxious to begin working again.    Past Medical History  Diagnosis Date  . Reflux   . Hypertension   . Cancer     uterine carcinosarcoma  . DVT (deep venous thrombosis)     right leg 12'14  . Pulmonary emboli     12'14 denies any sob  . Elevated blood uric acid level     tx. Allopurinol  . GERD (gastroesophageal reflux disease)   . Transfusion history 07-26-13    12'14, 04-14-13(2 units), 06-09-13  . Anemia   . Family history of malignant neoplasm of breast   . Ovarian cancer 2015  . Asthma     endometrial ca  . Hx of radiation therapy HDR    x 5 tx   Past Surgical History  Procedure Laterality Date  . No past surgeries    . Portacath placement Right     rigth chest- remains  . Ivc Right     filter placed to prevent Pulmonary emboli  . Abdominal hysterectomy N/A 08/01/2013    Procedure: EXPLORATORY LAPAROTOMY/HYSTERECTOMY TOTAL ABDOMINAL;  Surgeon: Janie Morning, MD;  Location: [facility];  Service: Gynecology;  Laterality: N/A;  . Salpingoophorectomy Bilateral 08/01/2013    Procedure: BILATERAL SALPINGO OOPHORECTOMY/OMENTECTOMY ;  Surgeon: Janie Morning, MD;  Location: [facility];  Service: Gynecology;  Laterality: Bilateral;     GYN History G0 Hx of Abnormal Pap Smears: no, last pap ~2010  Hx of STIs: no  Never had regular periods.   Social Hx:   Getting married in April.   Family History  several second degree relatives with breast cancer.   Review of Systems  Reports recent weight gain.  Denies cough, no hemoptysis, reports intermittent nausea she reports constipation. No vaginal bleeding, no  rectal bleeding. No dysphagia, no vaginal bleeding, otherwise ROS negative  Physical Exam  LMP 03/09/2013  Wt Readings from Last 3 Encounters:  05/03/14 266 lb 9.6 oz (120.929 kg)  04/05/14 256 lb 8 oz (116.348  kg)  02/02/14 244 lb 8 oz (110.904 kg)      General: No acute distress.  HEENT: Extra occular muscles grossly intact.  Pulmonary: CTAB. Normal work of breathing.  Cardiovascular: RRR.  Abdomen: Soft NT.  Midline incision well healed.  No masses or hernia.  No rebound/guarding.  Musculoskeletal: Grossly normal ROM.  Extremities: Warm and well-perfused. 1+ pitting edema  Neurological: Alert and oriented x3.  Psychological: Appropriate mood/affect.  Genitourinary:  Nl EGBUS, Vagina without bleeding or discharge.  Cuff intact.  No cul de sac masses. LN:  No cervical supraclavicular or inguinal adenopathy.

## 2014-05-03 NOTE — Patient Instructions (Signed)
CT Chest/Abd/Pelvis 08/2014 Follow-up 08/2014   Thank you very much [name] for allowing me to provide care for you today.  I appreciate your confidence in choosing our Gynecologic Oncology team.  If you have any questions about your visit today please call our office and we will get back to you as soon as possible.  Please consider using the website Medlineplus.gov as an Geneticist, molecular.   Francetta Found. Benicio Manna MD., PhD Gynecologic Oncology

## 2014-05-04 ENCOUNTER — Telehealth: Payer: Self-pay

## 2014-05-04 NOTE — Telephone Encounter (Signed)
Spoke with Felicie Morn and asked her  to make a note that future refill go to Dr. Azalia Bilis.  Malachy Mood verbalized understanding.

## 2014-05-04 NOTE — Telephone Encounter (Signed)
-----   Message from Gordy Levan, MD sent at 04/27/2014  8:29 PM EST ----- [place] for PCP to refill from here since not on chemo  ----- Message -----    From: Ardeen Garland, RN    Sent: 04/27/2014   4:15 PM      To: Baruch Merl, RN, Gordy Levan, MD  I sent one more month refill for allopurinol .  Does she need to continue ? If so she will need more refills Diane

## 2014-05-07 NOTE — Addendum Note (Signed)
Encounter addended by: Eppie Gibson, MD on: 05/07/2014  8:38 PM<BR>     Documentation filed: Clinical Notes

## 2014-05-10 ENCOUNTER — Telehealth: Payer: Self-pay | Admitting: Oncology

## 2014-05-10 NOTE — Telephone Encounter (Signed)
, °

## 2014-06-01 ENCOUNTER — Telehealth: Payer: Self-pay | Admitting: *Deleted

## 2014-06-01 NOTE — Telephone Encounter (Signed)
Patient called stating that she wanted to begin a multivitamin but wanted Dr. Marko Plume input on this matter. MD Marko Plume suggested Womens multivitamin or Centrum. RN spoke with patient. Patient verbalized understanding.

## 2014-06-01 NOTE — Telephone Encounter (Signed)
PT. HAS HAD A "COLD" WITH SINUS CONGESTION, SORE THROAT, AND COUGH. SHE DOES NOT HAVE A FEVER. PT. HAS BEEN GARGLING AND HER THROAT IS NO LONGER SORE. NASAL DRAINAGE IS CLEAR. WHEN SHE COUGHS THE SPUTUM IS YELLOW TO GREEN IN COLOR. NO TIGHTNESS IN CHEST OR SHORTNESS OF BREATH. PT. HAS BEEN USING [facility]. NOT SURE SHE COULD REACH HER PRIMARY CARE PHYSICIAN. PT.'S PHARMACY IS [facility].

## 2014-06-01 NOTE — Telephone Encounter (Signed)
Told Caitlyn Higgins that she needed to call her PCP regarding her cold symptoms.  She stated that she had them on the other line now discussing her symptoms.

## 2014-06-25 ENCOUNTER — Other Ambulatory Visit: Payer: Self-pay

## 2014-06-25 ENCOUNTER — Other Ambulatory Visit: Payer: Self-pay | Admitting: Oncology

## 2014-06-25 DIAGNOSIS — C541 Malignant neoplasm of endometrium: Secondary | ICD-10-CM

## 2014-06-25 MED ORDER — FERROUS FUMARATE 325 (106 FE) MG PO TABS: 1.0000 | ORAL_TABLET | Freq: Every day | ORAL | Status: DC

## 2014-06-25 NOTE — Telephone Encounter (Signed)
Called  [facility] pharmacy and told [facility] pharmacist the Allopurinol needed to be refilled by Dr. Azalia Bilis per Dr. Marko Plume after 05-04-14 refill.

## 2014-07-17 ENCOUNTER — Telehealth: Payer: Self-pay | Admitting: Oncology

## 2014-07-17 NOTE — Telephone Encounter (Signed)
Called and left a message with a new appointment time per pof to allow for chemo patient

## 2014-07-30 ENCOUNTER — Ambulatory Visit: Payer: No Typology Code available for payment source | Admitting: Oncology

## 2014-07-30 ENCOUNTER — Other Ambulatory Visit: Payer: No Typology Code available for payment source

## 2014-07-31 ENCOUNTER — Other Ambulatory Visit: Payer: Self-pay | Admitting: Oncology

## 2014-07-31 ENCOUNTER — Telehealth: Payer: Self-pay | Admitting: *Deleted

## 2014-07-31 NOTE — Telephone Encounter (Signed)
VM message from patient regarding refill for allopurinol. Reviewed note from louise, RN from February 2016 that says future allopurinol refills go to Dr. Shirline Frees. Spoke with patient and she states that Dr. Kenton Kingfisher was not the prescribing MD for this medication, that Dr. Marko Plume is. Therefore he would not refill it. Pt wants to know if she still needs to take this and if so, she needs refill. Reviewed labs and last Uric Acid level noted in EPIC was done 04/19/13 and was 7.1 Please Advise

## 2014-07-31 NOTE — Telephone Encounter (Signed)
Call made to patient and relayed Dr. Mariana Kaufman message to her.  Caitlyn Higgins chose to discuss the allopurinol and follow-up on her Uric acid levels directly with Dr. Kenton Kingfisher.

## 2014-07-31 NOTE — Telephone Encounter (Signed)
Re triage note 07-31-14    Patient should discuss directly with Dr Kenton Kingfisher by phone or when she sees him next, as she has had elevated uric acid which is NOT cancer related. Dr Kenton Kingfisher may or may not want her to be on allopurinol, but he is correct MD to decide that now. If patient agrees, please route this triage note / this reply to Dr Viona Gilmore. 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

## 2014-08-08 ENCOUNTER — Telehealth: Payer: Self-pay | Admitting: Oncology

## 2014-08-08 ENCOUNTER — Other Ambulatory Visit: Payer: Self-pay | Admitting: Oncology

## 2014-08-08 DIAGNOSIS — C562 Malignant neoplasm of left ovary: Secondary | ICD-10-CM

## 2014-08-08 DIAGNOSIS — C541 Malignant neoplasm of endometrium: Secondary | ICD-10-CM

## 2014-08-08 NOTE — Telephone Encounter (Signed)
returned call and lvm for pt confirming appts °

## 2014-08-10 ENCOUNTER — Telehealth: Payer: Self-pay | Admitting: Oncology

## 2014-08-10 ENCOUNTER — Other Ambulatory Visit (HOSPITAL_BASED_OUTPATIENT_CLINIC_OR_DEPARTMENT_OTHER): Payer: No Typology Code available for payment source

## 2014-08-10 ENCOUNTER — Encounter: Payer: Self-pay | Admitting: Oncology

## 2014-08-10 ENCOUNTER — Ambulatory Visit (HOSPITAL_BASED_OUTPATIENT_CLINIC_OR_DEPARTMENT_OTHER): Payer: No Typology Code available for payment source | Admitting: Oncology

## 2014-08-10 VITALS — BP 137/71 | HR 72 | Temp 97.4°F | Resp 18 | Ht 67.0 in | Wt 274.0 lb

## 2014-08-10 DIAGNOSIS — D649 Anemia, unspecified: Secondary | ICD-10-CM | POA: Diagnosis not present

## 2014-08-10 DIAGNOSIS — C562 Malignant neoplasm of left ovary: Secondary | ICD-10-CM

## 2014-08-10 DIAGNOSIS — Z95828 Presence of other vascular implants and grafts: Secondary | ICD-10-CM

## 2014-08-10 DIAGNOSIS — I82401 Acute embolism and thrombosis of unspecified deep veins of right lower extremity: Secondary | ICD-10-CM | POA: Diagnosis not present

## 2014-08-10 DIAGNOSIS — C787 Secondary malignant neoplasm of liver and intrahepatic bile duct: Secondary | ICD-10-CM | POA: Diagnosis not present

## 2014-08-10 DIAGNOSIS — C55 Malignant neoplasm of uterus, part unspecified: Secondary | ICD-10-CM

## 2014-08-10 DIAGNOSIS — C541 Malignant neoplasm of endometrium: Secondary | ICD-10-CM

## 2014-08-10 DIAGNOSIS — D6481 Anemia due to antineoplastic chemotherapy: Secondary | ICD-10-CM

## 2014-08-10 LAB — CBC WITH DIFFERENTIAL/PLATELET
BASO%: 0.2 % (ref 0.0–2.0)
BASOS ABS: 0 10*3/uL (ref 0.0–0.1)
EOS ABS: 0 10*3/uL (ref 0.0–0.5)
EOS%: 0.2 % (ref 0.0–7.0)
HCT: 31.7 % — ABNORMAL LOW (ref 34.8–46.6)
HEMOGLOBIN: 10.6 g/dL — AB (ref 11.6–15.9)
LYMPH%: 23.5 % (ref 14.0–49.7)
MCH: 31.9 pg (ref 25.1–34.0)
MCHC: 33.4 g/dL (ref 31.5–36.0)
MCV: 95.5 fL (ref 79.5–101.0)
MONO#: 0.3 10*3/uL (ref 0.1–0.9)
MONO%: 6 % (ref 0.0–14.0)
NEUT#: 3.5 10*3/uL (ref 1.5–6.5)
NEUT%: 70.1 % (ref 38.4–76.8)
PLATELETS: 198 10*3/uL (ref 145–400)
RBC: 3.32 10*6/uL — ABNORMAL LOW (ref 3.70–5.45)
RDW: 14.5 % (ref 11.2–14.5)
WBC: 5 10*3/uL (ref 3.9–10.3)
lymph#: 1.2 10*3/uL (ref 0.9–3.3)

## 2014-08-10 LAB — COMPREHENSIVE METABOLIC PANEL (CC13)
ALK PHOS: 104 U/L (ref 40–150)
ALT: 22 U/L (ref 0–55)
AST: 26 U/L (ref 5–34)
Albumin: 3.7 g/dL (ref 3.5–5.0)
Anion Gap: 11 mEq/L (ref 3–11)
BUN: 10.7 mg/dL (ref 7.0–26.0)
CALCIUM: 9.2 mg/dL (ref 8.4–10.4)
CO2: 27 mEq/L (ref 22–29)
CREATININE: 1 mg/dL (ref 0.6–1.1)
Chloride: 103 mEq/L (ref 98–109)
EGFR: 77 mL/min/{1.73_m2} — AB (ref >90)
Glucose: 83 mg/dl (ref 70–140)
Potassium: 3.6 mEq/L (ref 3.5–5.1)
Sodium: 141 mEq/L (ref 136–145)
Total Bilirubin: 0.77 mg/dL (ref 0.20–1.20)
Total Protein: 8.6 g/dL — ABNORMAL HIGH (ref 6.4–8.3)
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
## 2014-08-10 NOTE — Telephone Encounter (Signed)
Gave avs & calendar for November.  °

## 2014-08-11 LAB — CA 125: CA 125: 4 U/mL (ref <35)

## 2014-08-14 DIAGNOSIS — C55 Malignant neoplasm of uterus, part unspecified: Secondary | ICD-10-CM | POA: Insufficient documentation

## 2014-08-14 DIAGNOSIS — Z95828 Presence of other vascular implants and grafts: Secondary | ICD-10-CM | POA: Insufficient documentation

## 2014-08-17 ENCOUNTER — Telehealth: Payer: Self-pay

## 2014-08-17 NOTE — Telephone Encounter (Signed)
Left message in voice mail regarding the CA-125 level and "detox cleanser"as noted below by Dr. Marko Plume.

## 2014-08-17 NOTE — Telephone Encounter (Signed)
-----   Message from Gordy Levan, MD sent at 08/17/2014  8:39 AM EDT ----- Labs seen and need follow up: please let her know CA 125 is in good low range at 4, unchanged. Chemistries good. Message from triage re patient asking to use "detox cleanser": I do not recommend these.  thanks

## 2014-08-24 ENCOUNTER — Other Ambulatory Visit: Payer: Self-pay | Admitting: Oncology

## 2014-08-24 DIAGNOSIS — K219 Gastro-esophageal reflux disease without esophagitis: Secondary | ICD-10-CM

## 2014-08-28 ENCOUNTER — Ambulatory Visit (HOSPITAL_COMMUNITY)
Admission: RE | Admit: 2014-08-28 | Discharge: 2014-08-28 | Disposition: A | Discharge status: Home / Self Care | Payer: No Typology Code available for payment source | Source: Ambulatory Visit | Attending: Gynecologic Oncology | Admitting: Gynecologic Oncology

## 2014-08-28 ENCOUNTER — Encounter (HOSPITAL_COMMUNITY): Payer: Self-pay

## 2014-08-28 DIAGNOSIS — Z90710 Acquired absence of both cervix and uterus: Secondary | ICD-10-CM | POA: Diagnosis not present

## 2014-08-28 DIAGNOSIS — C569 Malignant neoplasm of unspecified ovary: Secondary | ICD-10-CM | POA: Insufficient documentation

## 2014-08-28 DIAGNOSIS — Z923 Personal history of irradiation: Secondary | ICD-10-CM | POA: Diagnosis not present

## 2014-08-28 DIAGNOSIS — Z9221 Personal history of antineoplastic chemotherapy: Secondary | ICD-10-CM | POA: Insufficient documentation

## 2014-08-28 DIAGNOSIS — C55 Malignant neoplasm of uterus, part unspecified: Secondary | ICD-10-CM | POA: Insufficient documentation

## 2014-08-28 MED ORDER — IOHEXOL 300 MG/ML  SOLN
100.0000 mL | Freq: Once | INTRAMUSCULAR | Status: AC | PRN
  Administered 2014-08-28: 100 mL via INTRAVENOUS

## 2014-08-29 ENCOUNTER — Telehealth: Payer: Self-pay | Admitting: *Deleted

## 2014-08-29 NOTE — Telephone Encounter (Signed)
Patient called reporting she missed a call from [facility] and could onely hear noise.  No phone documentation located.  Did share tomorrow's appointment information with Dr. Skeet Latch at 10:00 am.  No furth er questions.

## 2014-08-29 NOTE — Telephone Encounter (Signed)
Patient had a phone message yesterday that she could not understand and wonders what it was.  Called patient back.  No phone messages documented from yesterday in her chart.  Let her know that she does have an appt.tomorrow 6/9 with Dr. Skeet Latch at [place] and this may have been the message that was left by the automated system.  Patient appreciated the call back.

## 2014-08-30 ENCOUNTER — Encounter: Payer: Self-pay | Admitting: Gynecologic Oncology

## 2014-08-30 ENCOUNTER — Ambulatory Visit: Payer: No Typology Code available for payment source | Attending: Gynecologic Oncology | Admitting: Gynecologic Oncology

## 2014-08-30 VITALS — BP 133/68 | HR 80 | Temp 97.2°F | Resp 20 | Ht 67.0 in | Wt 270.1 lb

## 2014-08-30 DIAGNOSIS — C569 Malignant neoplasm of unspecified ovary: Secondary | ICD-10-CM | POA: Insufficient documentation

## 2014-08-30 DIAGNOSIS — C55 Malignant neoplasm of uterus, part unspecified: Secondary | ICD-10-CM | POA: Insufficient documentation

## 2014-08-30 NOTE — Patient Instructions (Signed)
Doing well.  Plan to follow up in three months or sooner if needed.  Please call for the development of any new symptoms.

## 2014-08-30 NOTE — Progress Notes (Signed)
Office Visit:  GYN ONCOLOGY  Chief Complaint:  Uterine carcinosarcoma, ovarian clear cell cancer  Assessment/Plan  Caitlyn Higgins. Caitlyn Higgins Higgins is a 49 y.o. 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  Physical examination was notable for cervical involvement.   She was S/P IVC filter. Caitlyn Higgins Caitlyn Higgins Higgins received 4 cycles of chemotherapy with resolution of the cervical lesion and no further bleeding.  Final pathology c/w two primary malignancies.  Predominantly clear cell cancer arising in the left ovarian endometriotic cyst and primary uterine carcinosarcoma. Chemotherapy completed  01/04/2014.  Vaginal cuff bracytherapy completed 03/09/2014. Caitlyn Higgins Higgins is at high risk for recurrence.  Imaging 08/28/2014 without disease. Will follow clinically Follow-up in 3 months  Obesity: Discussed weight maintenance   History of Present Illness  Caitlyn Higgins Higgins is a 49 y.o. who presented with a  pelvic mass, to [facility] and was transferred to [facility] 02/23/2013.   The patient feels that she was in her usual state of health until a few days prior to presentation when she noted abdominal distention and bloating. She presented to her gastroenterologist and was sent for a CT scan.  The CT 02/22/2013 demonstrated " 1. 24.1 x 16.8 x 19.9 cm complex cystic mass which appears to arise from the pelvis extending into the mid upper abdomen is highly suspicious for an ovarian neoplasm. There is evidence of early omental disease, as well as a serosal implant upon the surface of the liver, with small volume of probable malignant ascites, and potential transdiaphragmatic extension based on small lymph nodes immediately above the diaphragm.  2. Importantly, this pelvic mass is causing severe compression of the proximal inferior vena  cava, and there is a large volume of deep venous thrombosis in the visualized portion of the right thigh involving right superficial femoral, profunda femoral and common femoral veins. Additionally, there is a mass like peripheral wedge-shaped opacity in the right lower lobe. In the setting of deep venous thrombosis, this wedge-shaped opacity would be most concerning for potential pulmonary infarction. Associated with this, there is a trace right pleural effusion. 3. 5.2 x 1.8 cm heterogeneously enhancing lesion  associated with the liver capsule overlying the right lobe of the  liver "  She was transferred to [facility].    IVC filter was placed and she was started on Lovenox.  She was subsequently transitioned to warfarin.   In December 2014 an endometrial biopsy was collected. Pathology :  1. Cervix, biopsy - HIGH GRADE ENDOMETRIAL CARCINOMA, SEE COMMENT. 2. Endometrium, biopsy - HIGH GRADE ENDOMETRIAL CARCINOMA, SEE COMMENT. 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  Caitlyn Higgins Higgins  received the 4th cycle of Taxol carboplatin therapy on 05/24/2013. She underwent interval debulking TAH BSO omentectomy 08/01/2013  Specimen(s): Left ovary and fallopian tube.  Left salpingo-oophorectomy Primary tumor site (including laterality): Left ovary Ovarian surface involvement: Present. Ovarian capsule intact without fragmentation: No. Maximum tumor size (cm): 20 cm, gross measurement. Histologic type: Mixed surface epithelial carcinoma with predominant clear cell carcinoma component (70%) and minor endometrioid component (30%) Please see comment for detail. Grade: High grade  TNM code: [id number], pNX FIGO Stage (based on pathologic findings, needs clinical correlation): at least IC2 Comments: Received grossly  is a 20 cm focally  disrupted and partially collapsed multicystic ovary with attached/adherent omentum. Microscopically, sectioned show an invasive high grade surface carcinoma arising in a background of endometriotic cyst. The tumor is predominately composed of clear cell carcinoma component with minor endometrioid carcinoma component. No tumor involvement is identified in the adherent omentum tissue. Immunohistochemical stains were performed and the clear cell carcinoma is patchy positive for p16, ER, p53, negative for WT-1 and p63. Focal angiolymphatic invasion is present.   The morphologic features are different from those seen in the uterus.  2. ONCOLOGY TABLE-UTERUS, CARCINOSARCOMA Specimen: Uterus, cervix and right fallopian tubes and ovaries. Procedure: Total hysterectomy and right salpingo-oophorectomy. Lymph node sampling performed: No. Specimen integrity: Intact. Maximum tumor size: Multiple microscopic foci, measuring up to 0.5 cm in greatest dimension. Histologic type: Carcinosarcoma (MMMT) with heterologous component (ossification) Grade: High grade Myometrial invasion: 2.1 cm where myometrium is 2.5 cm in thickness Cervical stromal involvement: No. Extent of involvement of other organs: No. Lymph - vascular invasion: Not identified.  FIGO Stage (based on pathologic findings, needs clinical correlation): IB Comment: Immunohistochemical stains were performed and the residual carcinoma is positive for p16, negative for WT-1, weakly positive for p53 and ER. The morphologic features are different from those seen in the left ovary. (HCL:gt, 08/04/13)   She has had total of 9 cycles of carboplatin and taxol thru 01-04-14.    CT 01/22/2014 IMPRESSION: 1. Postoperative changes of hysterectomy and left salpingo-oophorectomy without evidence of recurrent or metastatic disease. 2. Scattered lymph nodes and small omental nodules are unchanged  Received vaginal brachytherapy TREATMENT DATES: 12/2, 12/7,  12/11, 12/14 and 03/09/14  SITE/DOSE: Vaginal Cuff 4 cm treatment length / 30 Gy in 5 fractions  Technique: HDR with Ir-192  Genetic testing negative  Is doing very well. Reports weight gain,  Surveillance imaging  08/28/2014  IMPRESSION: 1. No significant change in multiple prominent/mildly enlarged axillary, retroperitoneal and pelvic lymph nodes as described. 2. No recurrent ascites or peritoneal nodularity identified. 3. No evidence of solid visceral organ, pulmonary or osseous metastatic disease. 4. Stable left thyroid nodule.    Past Medical History  Diagnosis Date  . Reflux   . Hypertension   . DVT (deep venous thrombosis)     right leg 12'14  . Pulmonary emboli     12'14 denies any sob  . Elevated blood uric acid level     tx. Allopurinol  . GERD (gastroesophageal reflux disease)   . Transfusion history 07-26-13    12'14, 04-14-13(2 units), 06-09-13  . Anemia   . Family history of malignant neoplasm of breast   . Asthma     endometrial ca  . Hx of radiation therapy HDR    x 5 tx  . Cancer     uterine carcinosarcoma  . Ovarian cancer 2015   Past Surgical History  Procedure Laterality Date  . No past surgeries    . Portacath placement Right     rigth chest- remains  . Ivc Right     filter placed to prevent Pulmonary emboli  . Abdominal hysterectomy N/A 08/01/2013    Procedure: EXPLORATORY LAPAROTOMY/HYSTERECTOMY TOTAL ABDOMINAL;  Surgeon: Janie Morning, MD;  Location: [facility];  Service: Gynecology;  Laterality: N/A;  . Salpingoophorectomy Bilateral 08/01/2013    Procedure: BILATERAL SALPINGO OOPHORECTOMY/OMENTECTOMY ;  Surgeon: Janie Morning, MD;  Location: [facility];  Service: Gynecology;  Laterality: Bilateral;     GYN History G0 Hx of Abnormal Pap Smears: no, last pap ~2010  Hx of STIs: no  Never had regular periods.   Social Hx:   Had a fantastic wedding in April.  Family History  several  second degree relatives with breast cancer.   Review of Systems  Reports recent weight gain.  Denies cough, no hemoptysis, reports intermittent nausea she reports constipation. No vaginal bleeding, no rectal bleeding. No dysphagia, no vaginal bleeding, otherwise 10 point  ROS negative  Physical Exam  BP 133/68 mmHg  Pulse 80  Temp(Src) 97.2 F (36.2 C) (Oral)  Resp 20  Ht _0  (1.702 m)  Wt 122.517 kg (270 lb 1.6 oz)  BMI 42.29 kg/m2  LMP 03/09/2013  Wt Readings from Last 3 Encounters:  08/30/14 122.517 kg (270 lb 1.6 oz)  08/10/14 124.286 kg (274 lb)  05/03/14 120.929 kg (266 lb 9.6 oz)   General: No acute distress.  HEENT: Extra occular muscles grossly intact.  Pulmonary: CTAB. Normal work of breathing.  Cardiovascular: RRR.  Abdomen: Soft NT.  Midline incision well healed.  No masses or hernia.  No rebound/guarding.  Musculoskeletal: Grossly normal ROM.  Extremities: Warm and well-perfused. 1+ pitting edema  Neurological: Alert and oriented x3.  Psychological: Appropriate mood/affect.  Genitourinary:  Nl EGBUS, Vagina without bleeding or discharge.  Cuff intact.  No cul de sac masses. Rectal"  Good tone no masses LN:  No cervical supraclavicular or inguinal adenopathy.

## 2014-09-20 ENCOUNTER — Other Ambulatory Visit: Payer: Self-pay | Admitting: Endocrinology

## 2014-10-08 ENCOUNTER — Other Ambulatory Visit: Payer: Self-pay

## 2014-10-08 DIAGNOSIS — Z1231 Encounter for screening mammogram for malignant neoplasm of breast: Secondary | ICD-10-CM

## 2014-10-26 ENCOUNTER — Ambulatory Visit
Admission: RE | Admit: 2014-10-26 | Payer: No Typology Code available for payment source | Source: Ambulatory Visit | Admitting: Radiation Oncology

## 2014-11-02 ENCOUNTER — Telehealth: Payer: Self-pay | Admitting: Oncology

## 2014-11-02 ENCOUNTER — Ambulatory Visit
Admission: RE | Admit: 2014-11-02 | Payer: No Typology Code available for payment source | Source: Ambulatory Visit | Admitting: Radiation Oncology

## 2014-11-02 NOTE — Telephone Encounter (Signed)
Called Caitlyn Higgins regarding her follow up appointment with Dr. Isidore Moos today.  She said she had called and rescheduled for Friday for next week.

## 2014-11-05 ENCOUNTER — Telehealth: Payer: Self-pay | Admitting: *Deleted

## 2014-11-05 NOTE — Telephone Encounter (Signed)
CALLED PATIENT TO ASK QUESTION, LVM FOR A RETURN CALL 

## 2014-11-09 ENCOUNTER — Ambulatory Visit
Admission: RE | Admit: 2014-11-09 | Discharge: 2014-11-09 | Disposition: A | Discharge status: Home / Self Care | Payer: No Typology Code available for payment source | Source: Ambulatory Visit

## 2014-11-09 ENCOUNTER — Ambulatory Visit: Payer: Self-pay | Admitting: Radiation Oncology

## 2014-11-09 DIAGNOSIS — Z1231 Encounter for screening mammogram for malignant neoplasm of breast: Secondary | ICD-10-CM

## 2014-11-16 ENCOUNTER — Ambulatory Visit
Admission: RE | Admit: 2014-11-16 | Discharge: 2014-11-16 | Disposition: A | Discharge status: Home / Self Care | Payer: No Typology Code available for payment source | Source: Ambulatory Visit | Attending: Radiation Oncology | Admitting: Radiation Oncology

## 2014-11-16 VITALS — BP 146/79 | HR 85 | Temp 98.6°F | Wt 279.4 lb

## 2014-11-16 DIAGNOSIS — C541 Malignant neoplasm of endometrium: Secondary | ICD-10-CM

## 2014-11-16 NOTE — Progress Notes (Signed)
Radiation Oncology         [phone] ________________________________  Name: Caitlyn Higgins MRN: [id number]  Date: 11/16/2014  DOB: 27-Mar-1965  Follow-Up Visit Note  Outpatient  CC: Shirline Frees, MD  Janie Morning, MD  Diagnosis and Prior Radiotherapy:    ICD-9-CM ICD-10-CM   1. Endometrial cancer 182.0 C54.1     DIAGNOSIS: Stage II uterine carcinosarcoma    INDICATION FOR TREATMENT: Curative  TREATMENT DATES:  12/2, 12/7, 12/11, 12/14 and 03/09/14                     SITE/DOSE: Vaginal Cuff 4 cm treatment length  / 30 Gy in 5 fractions                     Technique:   HDR with Ir-192                                          Narrative:  The patient returns today for routine follow-up. Denies vaginal bleeding, pain, GI issues, nor any pain with sexual intercourse. States she uses dilator weekly at times. CT of chest on 08/28/14 was unremarkable, except no change in multiple prominent/mildly enlarged axillary, RP, and pelvic lymph nodes.  ALLERGIES:  has No Known Allergies.  Meds: Current Outpatient Prescriptions  Medication Sig Dispense Refill  . docusate sodium (COLACE) 100 MG capsule Take 100 mg by mouth daily as needed for mild constipation.     . ferrous fumarate (HEMOCYTE - 106 MG FE) 325 (106 FE) MG TABS tablet Take 1 tablet (106 mg of iron total) by mouth daily. 30 each 4  . levothyroxine (SYNTHROID, LEVOTHROID) 50 MCG tablet TAKE 1 TABLET BY MOUTH ONCE DAILY 30 tablet 2  . lidocaine (LIDODERM) 5 % Place 1 patch onto the skin daily as needed (For back pain.). Remove & Discard patch within 12 hours or as directed by MD    . loratadine (CLARITIN) 10 MG tablet Take 10 mg by mouth at bedtime.     Marland Kitchen omeprazole (PRILOSEC) 40 MG capsule Take 1 capsule (40 mg total) by mouth daily. 90 capsule 2  . allopurinol (ZYLOPRIM) 100 MG tablet Take 1 tablet (100 mg total) by mouth daily. (Patient not taking: Reported on 11/16/2014) 30 tablet 0   No current facility-administered  medications for this encounter.    Physical Findings: The patient is in no acute distress. Patient is alert and oriented.  weight is 279 lb 6.4 oz (126.735 kg). Her temperature is 98.6 F (37 C). Her blood pressure is 146/79 and her pulse is 85.   General: Alert and oriented, in no acute distress HEENT: Head is normocephalic. Extraocular movements are intact.   Neck: Neck is supple, no palpable cervical or supraclavicular lymphadenopathy. Heart: Regular in rate and rhythm with no murmurs, rubs, or gallops. Chest: Clear to auscultation bilaterally, with no rhonchi, wheezes, or rales. Abdomen: Soft, nontender, nondistended, with no rigidity or guarding. Extremities: No cyanosis or edema. Lymphatics: see Neck Exam. Groin is w/o adenopathy. Skin: No concerning lesions. Musculoskeletal: symmetric strength and muscle tone throughout. Neurologic: Cranial nerves II through XII are grossly intact. No obvious focalities. Speech is fluent.   Psychiatric: Judgment and insight are intact. Affect is appropriate. GYN: External genitalia appears normal. No lesions appreciated within the vaginal cuff upon digital or speculum exam.  Lab Findings: Lab Results  Component Value Date   WBC 5.0 08/10/2014   HGB 10.6* 08/10/2014   HCT 31.7* 08/10/2014   MCV 95.5 08/10/2014   PLT 198 08/10/2014    Radiographic Findings: Mm Screening Breast Tomo Bilateral  11/12/2014   CLINICAL DATA:  Screening.  EXAM: DIGITAL SCREENING BILATERAL MAMMOGRAM WITH 3D TOMO WITH CAD  COMPARISON:  Previous exam(s).  ACR Breast Density Category b: There are scattered areas of fibroglandular density.  FINDINGS: A partially included probably enlarged left axillary lymph node is noted. This was stable on a chest CT dated 08/28/2014. The patient has a history of uterine and ovarian cancer. There are no findings in either breast suspicious for malignancy. Images were processed with CAD.  IMPRESSION: No mammographic evidence of  malignancy. A partially included, previously seen enlarged left axillary lymph node is noted. A result letter of this screening mammogram will be mailed directly to the patient.  RECOMMENDATION: Screening mammogram in one year. (Code:SM-B-01Y)  BI-RADS CATEGORY  2: Benign.   Electronically Signed   By: Claudie Revering M.D.   On: 11/12/2014 18:19    Impression/Plan: She is feeling well w/o evidence of of disease recurrence. She will see Dr. Skeet Latch in 2 weeks. I will see her early December for her f/u.   This document serves as a record of services personally performed by Eppie Gibson, MD. It was created on her behalf by Darcus Austin, a trained medical scribe. The creation of this record is based on the scribe's personal observations and the provider's statements to them. This document has been checked and approved by the attending provider. _____________________________________   Eppie Gibson, MD

## 2014-11-16 NOTE — Progress Notes (Addendum)
Patient here for routine follow up completion of radiation/vaginal cuff HDR Ir-192  to pelvis for stage II uterine carcinoma.Denie pain.No bowel or bladder problems.States she uses dilator weekly at times but is sexually active.ct of chest 08/28/14 was unremarkable except no change in multiple prominent/mildly enlarged axillary pelvic lymph nodes. BP 146/79 mmHg  Pulse 85  Temp(Src) 98.6 F (37 C)  Wt 279 lb 6.4 oz (126.735 kg)  LMP 03/09/2013

## 2014-11-29 ENCOUNTER — Ambulatory Visit: Payer: No Typology Code available for payment source | Admitting: Gynecologic Oncology

## 2015-01-02 ENCOUNTER — Telehealth: Payer: Self-pay | Admitting: Gynecologic Oncology

## 2015-01-02 NOTE — Telephone Encounter (Signed)
Office Visit:  GYN ONCOLOGY  Chief Complaint:  Uterine carcinosarcoma, ovarian clear cell cancer  Assessment/Plan  [name] is a 49 y.o. 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  Physical examination was notable for cervical involvement.   She was S/P IVC filter. [name] received 4 cycles of chemotherapy with resolution of the cervical lesion and no further bleeding.  Final pathology c/w two primary malignancies.  Predominantly clear cell cancer arising in the left ovarian endometriotic cyst and primary uterine carcinosarcoma. Chemotherapy completed  01/04/2014.  Vaginal cuff bracytherapy completed 03/09/2014. Caitlyn Higgins is at high risk for recurrence.  Imaging 08/28/2014 without disease. Will follow clinically Follow-up in 3 months with Dr. Isidore Moos Follow-up with Gyn Onc in 6 months   Obesity: Discussed weight maintenance   History of Present Illness  Caitlyn Higgins is a 49 y.o. who presented with a  pelvic mass, to [facility] and was transferred to [facility] 02/23/2013.   The patient feels that she was in her usual state of health until a few days prior to presentation when she noted abdominal distention and bloating. She presented to her gastroenterologist and was sent for a CT scan.  The CT 02/22/2013 demonstrated " 1. 24.1 x 16.8 x 19.9 cm complex cystic mass which appears to arise from the pelvis extending into the mid upper abdomen is highly suspicious for an ovarian neoplasm. There is evidence of early omental disease, as well as a serosal implant upon the surface of the liver, with small volume of probable malignant ascites, and potential transdiaphragmatic extension based on small lymph nodes immediately above the diaphragm.  2. Importantly, this pelvic mass is  causing severe compression of the proximal inferior vena cava, and there is a large volume of deep venous thrombosis in the visualized portion of the right thigh involving right superficial femoral, profunda femoral and common femoral veins. Additionally, there is a mass like peripheral wedge-shaped opacity in the right lower lobe. In the setting of deep venous thrombosis, this wedge-shaped opacity would be most concerning for potential pulmonary infarction. Associated with this, there is a trace right pleural effusion. 3. 5.2 x 1.8 cm heterogeneously enhancing lesion  associated with the liver capsule overlying the right lobe of the  liver "  She was transferred to [facility].    IVC filter was placed and she was started on Lovenox.  She was subsequently transitioned to warfarin.   In December 2014 an endometrial biopsy was collected. Pathology :  1. Cervix, biopsy - HIGH GRADE ENDOMETRIAL CARCINOMA, SEE COMMENT. 2. Endometrium, biopsy - HIGH GRADE ENDOMETRIAL CARCINOMA, SEE COMMENT. 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  [name]  received the 4th cycle of Taxol carboplatin therapy on 05/24/2013. She underwent interval debulking TAH BSO omentectomy 08/01/2013  Specimen(s): Left ovary and fallopian tube.  Left salpingo-oophorectomy Primary tumor site (including laterality): Left ovary Ovarian surface involvement: Present. Ovarian capsule intact without fragmentation: No. Maximum tumor size (cm): 20 cm, gross measurement. Histologic type: Mixed surface epithelial carcinoma with predominant clear cell carcinoma component (70%) and minor endometrioid component (30%) Please see comment for detail. Grade: High grade  TNM code: ypT1c2, pNX FIGO Stage (based on  pathologic findings, needs clinical correlation): at least  IC2 Comments: Received grossly is a 20 cm focally disrupted and partially collapsed multicystic ovary with attached/adherent omentum. Microscopically, sectioned show an invasive high grade surface carcinoma arising in a background of endometriotic cyst. The tumor is predominately composed of clear cell carcinoma component with minor endometrioid carcinoma component. No tumor involvement is identified in the adherent omentum tissue. Immunohistochemical stains were performed and the clear cell carcinoma is patchy positive for p16, ER, p53, negative for WT-1 and p63. Focal angiolymphatic invasion is present.   The morphologic features are different from those seen in the uterus.  2. ONCOLOGY TABLE-UTERUS, CARCINOSARCOMA Specimen: Uterus, cervix and right fallopian tubes and ovaries. Procedure: Total hysterectomy and right salpingo-oophorectomy. Lymph node sampling performed: No. Specimen integrity: Intact. Maximum tumor size: Multiple microscopic foci, measuring up to 0.5 cm in greatest dimension. Histologic type: Carcinosarcoma (MMMT) with heterologous component (ossification) Grade: High grade Myometrial invasion: 2.1 cm where myometrium is 2.5 cm in thickness Cervical stromal involvement: No. Extent of involvement of other organs: No. Lymph - vascular invasion: Not identified.  FIGO Stage (based on pathologic findings, needs clinical correlation): IB Comment: Immunohistochemical stains were performed and the residual carcinoma is positive for p16, negative for WT-1, weakly positive for p53 and ER. The morphologic features are different from those seen in the left ovary. (HCL:gt, 08/04/13)   She has had total of 9 cycles of carboplatin and taxol thru 01-04-14.    CT 01/22/2014 IMPRESSION: 1. Postoperative changes of hysterectomy and left salpingo-oophorectomy without evidence of recurrent or metastatic disease. 2. Scattered lymph nodes and small omental nodules are unchanged  Received  vaginal brachytherapy TREATMENT DATES: 12/2, 12/7, 12/11, 12/14 and 03/09/14  SITE/DOSE: Vaginal Cuff 4 cm treatment length / 30 Gy in 5 fractions  Technique: HDR with Ir-192  Genetic testing negative  Is doing very well. Reports weight gain,  Surveillance imaging  08/28/2014  IMPRESSION: 1. No significant change in multiple prominent/mildly enlarged axillary, retroperitoneal and pelvic lymph nodes as described. 2. No recurrent ascites or peritoneal nodularity identified. 3. No evidence of solid visceral organ, pulmonary or osseous metastatic disease. 4. Stable left thyroid nodule.  Lab Results  Component Value Date   CA125 4 08/10/2014     Past Medical History  Diagnosis Date  . Reflux   . Hypertension   . DVT (deep venous thrombosis)     right leg 12'14  . Pulmonary emboli     12'14 denies any sob  . Elevated blood uric acid level     tx. Allopurinol  . GERD (gastroesophageal reflux disease)   . Transfusion history 07-26-13    12'14, 04-14-13(2 units), 06-09-13  . Anemia   . Family history of malignant neoplasm of breast   . Asthma     endometrial ca  . Hx of radiation therapy HDR    x 5 tx  . Cancer     uterine carcinosarcoma  . Ovarian cancer 2015   Past Surgical History  Procedure Laterality Date  . No past surgeries    . Portacath placement Right     rigth chest- remains  . Ivc Right     filter placed to prevent Pulmonary emboli  . Abdominal hysterectomy N/A 08/01/2013    Procedure: EXPLORATORY LAPAROTOMY/HYSTERECTOMY TOTAL ABDOMINAL;  Surgeon: Janie Morning, MD;  Location: [facility];  Service: Gynecology;  Laterality: N/A;  . Salpingoophorectomy Bilateral 08/01/2013    Procedure: BILATERAL SALPINGO OOPHORECTOMY/OMENTECTOMY ;  Surgeon: Janie Morning, MD;  Location:  [facility];  Service: Gynecology;  Laterality: Bilateral;     GYN History G0 Hx of Abnormal Pap Smears: no, last pap ~2010  Hx of  STIs: no  Never had regular periods.   Social Hx:   Had a fantastic wedding in April.  Family History  several second degree relatives with breast cancer.   Review of Systems  Reports recent weight gain.  Denies cough, no hemoptysis, reports intermittent nausea she reports constipation. No vaginal bleeding, no rectal bleeding. No dysphagia, no vaginal bleeding, otherwise 10 point  ROS negative  Physical Exam  BP 133/68 mmHg  Pulse 80  Temp(Src) 97.2 F (36.2 C) (Oral)  Resp 20  Ht '5\' 7"'  (1.702 m)  Wt 122.517 kg (270 lb 1.6 oz)  BMI 42.29 kg/m2  LMP 03/09/2013  Wt Readings from Last 3 Encounters:  11/16/14 279 lb 6.4 oz (126.735 kg)  08/30/14 270 lb 1.6 oz (122.517 kg)  08/10/14 274 lb (124.286 kg)   General: No acute distress.  HEENT: Extra occular muscles grossly intact.  Pulmonary: CTAB. Normal work of breathing.  Cardiovascular: RRR.  Abdomen: Soft NT.  Midline incision well healed.  No masses or hernia.  No rebound/guarding.  Musculoskeletal: Grossly normal ROM.  Extremities: Warm and well-perfused. 1+ pitting edema  Neurological: Alert and oriented x3.  Psychological: Appropriate mood/affect.  Genitourinary:  Nl EGBUS, Vagina without bleeding or discharge.  Cuff intact.  No cul de sac masses. Rectal"  Good tone no masses LN:  No cervical supraclavicular or inguinal adenopathy.

## 2015-01-03 ENCOUNTER — Encounter: Payer: Self-pay | Admitting: Gynecologic Oncology

## 2015-01-03 ENCOUNTER — Ambulatory Visit: Payer: No Typology Code available for payment source | Attending: Gynecologic Oncology | Admitting: Gynecologic Oncology

## 2015-01-03 VITALS — BP 157/76 | HR 70 | Temp 98.0°F | Resp 19 | Ht 67.0 in | Wt 276.6 lb

## 2015-01-03 DIAGNOSIS — E669 Obesity, unspecified: Secondary | ICD-10-CM | POA: Diagnosis not present

## 2015-01-03 DIAGNOSIS — C569 Malignant neoplasm of unspecified ovary: Secondary | ICD-10-CM | POA: Diagnosis not present

## 2015-01-03 DIAGNOSIS — Z9221 Personal history of antineoplastic chemotherapy: Secondary | ICD-10-CM

## 2015-01-03 DIAGNOSIS — C55 Malignant neoplasm of uterus, part unspecified: Secondary | ICD-10-CM | POA: Diagnosis present

## 2015-01-03 DIAGNOSIS — Z8542 Personal history of malignant neoplasm of other parts of uterus: Secondary | ICD-10-CM

## 2015-01-03 DIAGNOSIS — Z8543 Personal history of malignant neoplasm of ovary: Secondary | ICD-10-CM

## 2015-01-03 NOTE — Progress Notes (Signed)
Office Visit:  GYN ONCOLOGY  Chief Complaint:  Uterine carcinosarcoma, ovarian clear cell cancer  Assessment/Plan  Caitlyn. Caitlyn Higgins is a 49 y.o. 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  Physical examination was notable for cervical involvement.   She was S/P IVC filter. Caitlyn Caitlyn Higgins received 4 cycles of chemotherapy with resolution of the cervical lesion and no further bleeding.  Final pathology c/w two primary malignancies.  Predominantly clear cell cancer arising in the left ovarian endometriotic cyst and primary uterine carcinosarcoma. Chemotherapy completed  01/04/2014.  Vaginal cuff bracytherapy completed 03/09/2014. Caitlyn Higgins is at high risk for recurrence.  Imaging 08/28/2014 without disease. Will follow clinically Follow-up  with Dr. Isidore Moos and Marko Plume as scheduled Follow-up with Dr. Skeet Latch 05/2015 Counselled on the signs and symptoms of recurrence Pap 02/2015 Follow-up with Dr. Skeet Latch in 05/2015 Follow-up with Gyn Onc in 6 months   Obesity: Discussed weight maintenance   History of Present Illness  Caitlyn Higgins is a 49 y.o. who presented with a  pelvic mass, to [facility] and was transferred to [facility] 02/23/2013.   The patient feels that she was in her usual state of health until a few days prior to presentation when she noted abdominal distention and bloating. She presented to her gastroenterologist and was sent for a CT scan.  The CT 02/22/2013 demonstrated " 1. 24.1 x 16.8 x 19.9 cm complex cystic mass which appears to arise from the pelvis extending into the mid upper abdomen is highly suspicious for an ovarian neoplasm. There is evidence of early omental disease, as well as a serosal implant upon the surface of the liver, with small volume of probable malignant  ascites, and potential transdiaphragmatic extension based on small lymph nodes immediately above the diaphragm.  2. Importantly, this pelvic mass is causing severe compression of the proximal inferior vena cava, and there is a large volume of deep venous thrombosis in the visualized portion of the right thigh involving right superficial femoral, profunda femoral and common femoral veins. Additionally, there is a mass like peripheral wedge-shaped opacity in the right lower lobe. In the setting of deep venous thrombosis, this wedge-shaped opacity would be most concerning for potential pulmonary infarction. Associated with this, there is a trace right pleural effusion. 3. 5.2 x 1.8 cm heterogeneously enhancing lesion  associated with the liver capsule overlying the right lobe of the  liver "  She was transferred to [facility].    IVC filter was placed and she was started on Lovenox.  She was subsequently transitioned to warfarin.   In December 2014 an endometrial biopsy was collected. Pathology :  1. Cervix, biopsy - HIGH GRADE ENDOMETRIAL CARCINOMA, SEE COMMENT. 2. Endometrium, biopsy - HIGH GRADE ENDOMETRIAL CARCINOMA, SEE COMMENT. 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  Caitlyn Higgins  received the 4th cycle of Taxol carboplatin therapy on 05/24/2013. She underwent interval debulking TAH BSO omentectomy 08/01/2013  Specimen(s): Left ovary and fallopian tube.  Left salpingo-oophorectomy Primary tumor site (including laterality): Left ovary Ovarian surface involvement: Present. Ovarian capsule intact without fragmentation: No. Maximum tumor size (cm): 20 cm, gross measurement. Histologic type: Mixed surface epithelial carcinoma with predominant clear cell carcinoma component (  70%) and minor endometrioid component (30%) Please  see comment for detail. Grade: High grade  TNM code: ypT1c2, pNX FIGO Stage (based on pathologic findings, needs clinical correlation): at least IC2 Comments: Received grossly is a 20 cm focally disrupted and partially collapsed multicystic ovary with attached/adherent omentum. Microscopically, sectioned show an invasive high grade surface carcinoma arising in a background of endometriotic cyst. The tumor is predominately composed of clear cell carcinoma component with minor endometrioid carcinoma component. No tumor involvement is identified in the adherent omentum tissue. Immunohistochemical stains were performed and the clear cell carcinoma is patchy positive for p16, ER, p53, negative for WT-1 and p63. Focal angiolymphatic invasion is present.   The morphologic features are different from those seen in the uterus.  2. ONCOLOGY TABLE-UTERUS, CARCINOSARCOMA Specimen: Uterus, cervix and right fallopian tubes and ovaries. Procedure: Total hysterectomy and right salpingo-oophorectomy. Lymph node sampling performed: No. Specimen integrity: Intact. Maximum tumor size: Multiple microscopic foci, measuring up to 0.5 cm in greatest dimension. Histologic type: Carcinosarcoma (MMMT) with heterologous component (ossification) Grade: High grade Myometrial invasion: 2.1 cm where myometrium is 2.5 cm in thickness Cervical stromal involvement: No. Extent of involvement of other organs: No. Lymph - vascular invasion: Not identified.  FIGO Stage (based on pathologic findings, needs clinical correlation): IB Comment: Immunohistochemical stains were performed and the residual carcinoma is positive for p16, negative for WT-1, weakly positive for p53 and ER. The morphologic features are different from those seen in the left ovary. (HCL:gt, 08/04/13)   She has had total of 9 cycles of carboplatin and taxol thru 01-04-14.    CT 01/22/2014 IMPRESSION: 1. Postoperative changes of hysterectomy and left  salpingo-oophorectomy without evidence of recurrent or metastatic disease. 2. Scattered lymph nodes and small omental nodules are unchanged  Received vaginal brachytherapy TREATMENT DATES: 12/2, 12/7, 12/11, 12/14 and 03/09/14  SITE/DOSE: Vaginal Cuff 4 cm treatment length / 30 Gy in 5 fractions  Technique: HDR with Ir-192  Genetic testing negative  Is doing very well. Reports weight gain,  Surveillance imaging  08/28/2014  IMPRESSION: 1. No significant change in multiple prominent/mildly enlarged axillary, retroperitoneal and pelvic lymph nodes as described. 2. No recurrent ascites or peritoneal nodularity identified. 3. No evidence of solid visceral organ, pulmonary or osseous metastatic disease. 4. Stable left thyroid nodule.  Lab Results  Component Value Date   CA125 4 08/10/2014     Past Medical History  Diagnosis Date  . Reflux   . Hypertension   . DVT (deep venous thrombosis) (HCC)     right leg 12'14  . Pulmonary emboli ([place])     12'14 denies any sob  . Elevated blood uric acid level     tx. Allopurinol  . GERD (gastroesophageal reflux disease)   . Transfusion history 07-26-13    12'14, 04-14-13(2 units), 06-09-13  . Anemia   . Family history of malignant neoplasm of breast   . Asthma     endometrial ca  . Hx of radiation therapy HDR    x 5 tx  . Cancer (HCC)     uterine carcinosarcoma  . Ovarian cancer ([place]) 2015   Past Surgical History  Procedure Laterality Date  . No past surgeries    . Portacath placement Right     rigth chest- remains  . Ivc Right     filter placed to prevent Pulmonary emboli  . Abdominal hysterectomy N/A 08/01/2013    Procedure: EXPLORATORY LAPAROTOMY/HYSTERECTOMY TOTAL ABDOMINAL;  Surgeon: Janie Morning, MD;  Location: [facility];  Service: Gynecology;  Laterality: N/A;  . Salpingoophorectomy Bilateral 08/01/2013    Procedure: BILATERAL SALPINGO OOPHORECTOMY/OMENTECTOMY  ;  Surgeon: Janie Morning, MD;  Location: [facility];  Service: Gynecology;  Laterality: Bilateral;     GYN History G0 Hx of Abnormal Pap Smears: no, last pap ~2010  Hx of STIs: no  Never had regular periods.   Social Hx:   Had a fantastic wedding in April.  Family History  several second degree relatives with breast cancer.   Review of Systems  Reports recent weight gain.  Denies cough, no hemoptysis, reports residual foot neuropathy she reports constipation. No vaginal bleeding, no rectal bleeding. No dysphagia, no vaginal bleeding, otherwise 10 point  ROS negative  Physical Exam  BP 157/76 mmHg  Pulse 70  Temp(Src) 98 F (36.7 C) (Oral)  Resp 19  Ht '5\' 7"'  (1.702 m)  Wt 276 lb 9.6 oz (125.465 kg)  BMI 43.31 kg/m2  LMP 03/09/2013  Wt Readings from Last 3 Encounters:  01/03/15 276 lb 9.6 oz (125.465 kg)  11/16/14 279 lb 6.4 oz (126.735 kg)  08/30/14 270 lb 1.6 oz (122.517 kg)   General: No acute distress.  HEENT: Extra occular muscles grossly intact.  Pulmonary: CTAB. Normal work of breathing.  Cardiovascular: RRR.  Abdomen: Soft NT.  Midline incision well healed.  No masses or hernia.  No rebound/guarding.  Musculoskeletal: Grossly normal ROM.  Extremities: Warm and well-perfused. 1+ pitting edema  Neurological: Alert and oriented x3.  Psychological: Appropriate mood/affect.  Genitourinary:  Nl EGBUS, Vagina without bleeding or discharge.  Cuff intact.  No cul de sac masses. Rectal"  Good tone no masses LN:  No cervical supraclavicular or inguinal adenopathy.

## 2015-01-03 NOTE — Patient Instructions (Signed)
Plan to follow up with Dr. Skeet Latch in March or sooner if needed.  Please call for any questions, concerns, or new symptoms.  Thank you for coming to see Caitlyn Higgins today.  We appreciate your confidence in choosing [facility] for your medical care.  If you have any questions about your visit today, please call our office and we will get back to you as soon as possible.  Dr. Janie Morning and Joylene John, NP Gynecologic Oncology

## 2015-01-04 ENCOUNTER — Telehealth: Payer: Self-pay

## 2015-01-04 NOTE — Telephone Encounter (Signed)
Told [name] that she received the flu shot on 11-29-13.  She can get on now for the coming flu season. She inquired of the cost at the [facility].  Told her that this nurse did not know what her insurance would cover of the cost in the office. She could get the flu shot at PCP Dr. Kenton Kingfisher or a local pharmacy. Pt. Appreciated the information.

## 2015-01-30 ENCOUNTER — Telehealth: Payer: Self-pay | Admitting: Oncology

## 2015-01-30 ENCOUNTER — Other Ambulatory Visit: Payer: Self-pay | Admitting: Oncology

## 2015-01-30 NOTE — Telephone Encounter (Signed)
pof noted for patient to move for chemo patient but there was an opening to use  anne

## 2015-02-04 ENCOUNTER — Telehealth: Payer: Self-pay | Admitting: Oncology

## 2015-02-04 NOTE — Telephone Encounter (Signed)
Patient called in to reschedule her appointments °

## 2015-02-06 ENCOUNTER — Other Ambulatory Visit: Payer: Self-pay | Admitting: Oncology

## 2015-02-06 DIAGNOSIS — C562 Malignant neoplasm of left ovary: Secondary | ICD-10-CM

## 2015-02-06 DIAGNOSIS — C55 Malignant neoplasm of uterus, part unspecified: Secondary | ICD-10-CM

## 2015-02-07 ENCOUNTER — Ambulatory Visit: Payer: No Typology Code available for payment source | Admitting: Oncology

## 2015-02-07 ENCOUNTER — Other Ambulatory Visit: Payer: No Typology Code available for payment source

## 2015-03-03 ENCOUNTER — Other Ambulatory Visit: Payer: Self-pay | Admitting: Oncology

## 2015-03-04 ENCOUNTER — Encounter: Payer: Self-pay | Admitting: Oncology

## 2015-03-04 ENCOUNTER — Other Ambulatory Visit (HOSPITAL_BASED_OUTPATIENT_CLINIC_OR_DEPARTMENT_OTHER): Payer: No Typology Code available for payment source

## 2015-03-04 ENCOUNTER — Telehealth: Payer: Self-pay | Admitting: Oncology

## 2015-03-04 ENCOUNTER — Ambulatory Visit (HOSPITAL_BASED_OUTPATIENT_CLINIC_OR_DEPARTMENT_OTHER): Payer: No Typology Code available for payment source | Admitting: Oncology

## 2015-03-04 VITALS — BP 133/75 | HR 72 | Temp 98.7°F | Resp 18 | Ht 67.0 in | Wt 279.0 lb

## 2015-03-04 DIAGNOSIS — C55 Malignant neoplasm of uterus, part unspecified: Secondary | ICD-10-CM

## 2015-03-04 DIAGNOSIS — I82401 Acute embolism and thrombosis of unspecified deep veins of right lower extremity: Secondary | ICD-10-CM

## 2015-03-04 DIAGNOSIS — C787 Secondary malignant neoplasm of liver and intrahepatic bile duct: Secondary | ICD-10-CM | POA: Diagnosis not present

## 2015-03-04 DIAGNOSIS — D500 Iron deficiency anemia secondary to blood loss (chronic): Secondary | ICD-10-CM

## 2015-03-04 DIAGNOSIS — C562 Malignant neoplasm of left ovary: Secondary | ICD-10-CM

## 2015-03-04 DIAGNOSIS — D649 Anemia, unspecified: Secondary | ICD-10-CM

## 2015-03-04 DIAGNOSIS — Z7901 Long term (current) use of anticoagulants: Secondary | ICD-10-CM

## 2015-03-04 DIAGNOSIS — Z95828 Presence of other vascular implants and grafts: Secondary | ICD-10-CM

## 2015-03-04 LAB — CBC WITH DIFFERENTIAL/PLATELET
BASO%: 0.1 % (ref 0.0–2.0)
Basophils Absolute: 0 10*3/uL (ref 0.0–0.1)
EOS%: 0.3 % (ref 0.0–7.0)
Eosinophils Absolute: 0 10*3/uL (ref 0.0–0.5)
HCT: 32.1 % — ABNORMAL LOW (ref 34.8–46.6)
HGB: 10.7 g/dL — ABNORMAL LOW (ref 11.6–15.9)
LYMPH%: 15.2 % (ref 14.0–49.7)
MCH: 31.3 pg (ref 25.1–34.0)
MCHC: 33.4 g/dL (ref 31.5–36.0)
MCV: 93.8 fL (ref 79.5–101.0)
MONO#: 0.5 10*3/uL (ref 0.1–0.9)
MONO%: 7.2 % (ref 0.0–14.0)
NEUT%: 77.2 % — ABNORMAL HIGH (ref 38.4–76.8)
NEUTROS ABS: 5.1 10*3/uL (ref 1.5–6.5)
Platelets: 185 10*3/uL (ref 145–400)
RBC: 3.42 10*6/uL — ABNORMAL LOW (ref 3.70–5.45)
RDW: 15.1 % — AB (ref 11.2–14.5)
WBC: 6.6 10*3/uL (ref 3.9–10.3)
lymph#: 1 10*3/uL (ref 0.9–3.3)

## 2015-03-04 LAB — COMPREHENSIVE METABOLIC PANEL
ALBUMIN: 3.5 g/dL (ref 3.5–5.0)
ALT: 18 U/L (ref 0–55)
AST: 26 U/L (ref 5–34)
Alkaline Phosphatase: 107 U/L (ref 40–150)
Anion Gap: 9 mEq/L (ref 3–11)
BUN: 15.1 mg/dL (ref 7.0–26.0)
CO2: 23 mEq/L (ref 22–29)
Calcium: 8.9 mg/dL (ref 8.4–10.4)
Chloride: 108 mEq/L (ref 98–109)
Creatinine: 1.1 mg/dL (ref 0.6–1.1)
EGFR: 65 mL/min/{1.73_m2} — ABNORMAL LOW (ref >90)
GLUCOSE: 99 mg/dL (ref 70–140)
POTASSIUM: 3.4 meq/L — AB (ref 3.5–5.1)
SODIUM: 140 meq/L (ref 136–145)
Total Bilirubin: 0.61 mg/dL (ref 0.20–1.20)
Total Protein: 8.8 g/dL — ABNORMAL HIGH (ref 6.4–8.3)

## 2015-03-04 LAB — CA 125: CA 125: 4 U/mL (ref <35)

## 2015-03-04 NOTE — Telephone Encounter (Signed)
Gave and printd appt sched and avs for pt for Aug
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
## 2015-03-05 DIAGNOSIS — D500 Iron deficiency anemia secondary to blood loss (chronic): Secondary | ICD-10-CM | POA: Insufficient documentation

## 2015-03-05 NOTE — Progress Notes (Signed)
Radiation Oncology         [phone] ________________________________  Name: Caitlyn Higgins MRN: [id number]  Date: 03/06/2015  DOB: [birth year]  Follow-Up Visit Note  Outpatient  CC: Shirline Frees, MD  Janie Morning, MD  Diagnosis and Prior Radiotherapy:    ICD-9-CM ICD-10-CM   1. Endometrial cancer ([place]) 182.0 C54.1     DIAGNOSIS: Stage II uterine carcinosarcoma (patient also had ovarian cancer, not treated with RT)   INDICATION FOR TREATMENT: Curative  TREATMENT DATES:  12/2, 12/7, 12/11, 12/14 and 03/09/14                     SITE/DOSE: Vaginal Cuff 4 cm treatment length  / 30 Gy in 5 fractions                     Technique:   HDR with Ir-192                                          Narrative:  The patient returns today for routine follow-up. Denies vaginal bleeding, pain, GI issues, nor any pain with sexual intercourse. States she uses dilator at times. No recent imaging, and I do not see evidence of a recent Pap. Patient is not sure of last Pap test.   She saw Dr Marko Plume earlier this month and was considered to be doing well. Exercising at [facility]. Sister recently dx with breast cancer.  ALLERGIES:  has No Known Allergies.  Meds: Current Outpatient Prescriptions  Medication Sig Dispense Refill  . allopurinol (ZYLOPRIM) 100 MG tablet Take 1 tablet (100 mg total) by mouth daily. 30 tablet 0  . docusate sodium (COLACE) 100 MG capsule Take 100 mg by mouth daily as needed for mild constipation.     . ferrous fumarate (HEMOCYTE - 106 MG FE) 325 (106 FE) MG TABS tablet Take 1 tablet (106 mg of iron total) by mouth daily. 30 each 4  . levothyroxine (SYNTHROID, LEVOTHROID) 50 MCG tablet TAKE 1 TABLET BY MOUTH ONCE DAILY 30 tablet 2  . loratadine (CLARITIN) 10 MG tablet Take 10 mg by mouth at bedtime.     Marland Kitchen omeprazole (PRILOSEC) 40 MG capsule Take 1 capsule (40 mg total) by mouth daily. 90 capsule 2   No current facility-administered medications for this encounter.     Physical Findings: The patient is in no acute distress. Patient is alert and oriented.  height is 5\' 7"  (1.702 m) and weight is 283 lb 1.6 oz (128.413 kg). Her temperature is 97.6 F (36.4 C). Her blood pressure is 129/71 and her pulse is 73.   General: Alert and oriented, in no acute distress HEENT: Head is normocephalic.  Abdomen: Soft, nontender Extremities: No edema. Lymphatics:  Groin is w/o adenopathy. Skin: No concerning lesions. Psychiatric: Judgment and insight are intact. Affect is appropriate. GYN: External genitalia appears normal. No lesions appreciated within the vaginal cuff upon digital or speculum exam.  Lab Findings: Lab Results  Component Value Date   WBC 6.6 03/04/2015   HGB 10.7* 03/04/2015   HCT 32.1* 03/04/2015   MCV 93.8 03/04/2015   PLT 185 03/04/2015    Radiographic Findings: No results found.  Impression/Plan: She is feeling well w/o evidence of of disease recurrence. Pap results pending. I will see her back in about 6 months.  She knows to call if any concerns arise.  _____________________________________   Eppie Gibson, MD

## 2015-03-06 ENCOUNTER — Encounter: Payer: Self-pay | Admitting: Radiation Oncology

## 2015-03-06 ENCOUNTER — Other Ambulatory Visit (HOSPITAL_COMMUNITY)
Admission: RE | Admit: 2015-03-06 | Discharge: 2015-03-06 | Disposition: A | Discharge status: Home / Self Care | Payer: No Typology Code available for payment source | Source: Ambulatory Visit | Attending: Radiation Oncology | Admitting: Radiation Oncology

## 2015-03-06 ENCOUNTER — Telehealth: Payer: Self-pay

## 2015-03-06 ENCOUNTER — Other Ambulatory Visit: Payer: Self-pay | Admitting: *Deleted

## 2015-03-06 ENCOUNTER — Ambulatory Visit
Admission: RE | Admit: 2015-03-06 | Discharge: 2015-03-06 | Disposition: A | Discharge status: Home / Self Care | Payer: No Typology Code available for payment source | Source: Ambulatory Visit | Attending: Radiation Oncology | Admitting: Radiation Oncology

## 2015-03-06 VITALS — BP 129/71 | HR 73 | Temp 97.6°F | Ht 67.0 in | Wt 283.1 lb

## 2015-03-06 DIAGNOSIS — C541 Malignant neoplasm of endometrium: Secondary | ICD-10-CM

## 2015-03-06 DIAGNOSIS — Z01419 Encounter for gynecological examination (general) (routine) without abnormal findings: Secondary | ICD-10-CM | POA: Insufficient documentation

## 2015-03-06 NOTE — Telephone Encounter (Signed)
LM in patient's voice mail stating the results of her Ca-125 and potassium level from 03-04-15 as noted below by Dr. Marko Plume. Suggested potassium rich foods and OJ, cantaloupe, strawberries, and skins of baked potatoes.

## 2015-03-06 NOTE — Progress Notes (Signed)
[name] is here for follow up of radiation completed 03/09/14 to her uterine carcinoma (HDR).  She denies pain, vaginal discharge or bleeding. She also denies any problems with urination or her bowels. She has no other complaints at this time.   BP 129/71 mmHg  Pulse 73  Temp(Src) 97.6 F (36.4 C)  Ht 5\' 7"  (1.702 m)  Wt 283 lb 1.6 oz (128.413 kg)  BMI 44.33 kg/m2  LMP 03/09/2013

## 2015-03-06 NOTE — Telephone Encounter (Signed)
-----   Message from Gordy Levan, MD sent at 03/05/2015 11:29 AM EST ----- Labs seen and need follow up  Please let her know CA 125 stable in good low range at 4.   K a little low, needs to increase in diet

## 2015-03-07 ENCOUNTER — Telehealth: Payer: Self-pay | Admitting: *Deleted

## 2015-03-07 NOTE — Telephone Encounter (Signed)
CALLED PATIENT TO INFORM OF FU ON 09-20-15 @ 10 AM WITH DR. Isidore Moos, LVM FOR A RETURN CALL

## 2015-03-11 ENCOUNTER — Telehealth: Payer: Self-pay

## 2015-03-11 LAB — CYTOLOGY - PAP

## 2015-03-11 NOTE — Telephone Encounter (Signed)
I called and spoke with Caitlyn Higgins to let her know her Pap results were negative for malignancy. She voiced appreciation of the phone call, and I also instructed her to call back if she had any other questions.

## 2015-03-20 ENCOUNTER — Other Ambulatory Visit: Payer: Self-pay

## 2015-03-20 NOTE — Progress Notes (Signed)
POF sent to scheduling to make changes to appointments as requested below by Dr. Marko Plume.

## 2015-03-20 NOTE — Progress Notes (Signed)
Patient Demographics     Patient Name Sex DOB SSN Address Phone    Caitlyn Higgins Female 03-23-1966 [id number] [address] [place] [address] [phone] (Home) *Preferred* [phone] (Work) [phone] (Mobile)      Message  Received: 2 weeks ago    Gordy Levan, MD  Dorothyann Gibbs, NP; Lorriane Shire, NT Cc: Baruch Merl, RN           Next apt Dr Skeet Latch is scheduled 07-25-15.  If she is in clinic in March or April, that timing would be better, as Dr Isidore Moos and I have both seeing her mid Dec.   If Dr Leone Brand visit is moved earlier, need to move lab also to the day of her visit and need to move my next visit + lab to 3 months after hers  (presently CA 125 with Dr B on 07-25-15 and LL + lab Aug)   Thank you  Lennis

## 2015-03-21 ENCOUNTER — Telehealth: Payer: Self-pay | Admitting: Oncology

## 2015-03-21 NOTE — Telephone Encounter (Signed)
Called patient per pof and she is aware of her new appointment °

## 2015-06-10 ENCOUNTER — Other Ambulatory Visit: Payer: Self-pay | Admitting: Oncology

## 2015-06-10 ENCOUNTER — Telehealth: Payer: Self-pay | Admitting: Oncology

## 2015-06-10 DIAGNOSIS — C562 Malignant neoplasm of left ovary: Secondary | ICD-10-CM

## 2015-06-10 DIAGNOSIS — C55 Malignant neoplasm of uterus, part unspecified: Secondary | ICD-10-CM

## 2015-06-10 NOTE — Telephone Encounter (Signed)
Spoke with patient to confirm added lab appt to 3/30 visit

## 2015-06-20 ENCOUNTER — Encounter: Payer: Self-pay | Admitting: Gynecologic Oncology

## 2015-06-20 ENCOUNTER — Ambulatory Visit: Payer: BLUE CROSS/BLUE SHIELD | Attending: Gynecologic Oncology | Admitting: Gynecologic Oncology

## 2015-06-20 ENCOUNTER — Other Ambulatory Visit (HOSPITAL_BASED_OUTPATIENT_CLINIC_OR_DEPARTMENT_OTHER): Payer: BLUE CROSS/BLUE SHIELD

## 2015-06-20 ENCOUNTER — Other Ambulatory Visit: Payer: No Typology Code available for payment source

## 2015-06-20 VITALS — BP 159/82 | HR 74 | Temp 98.8°F | Resp 18 | Ht 67.0 in | Wt 281.3 lb

## 2015-06-20 DIAGNOSIS — I10 Essential (primary) hypertension: Secondary | ICD-10-CM | POA: Diagnosis not present

## 2015-06-20 DIAGNOSIS — C569 Malignant neoplasm of unspecified ovary: Secondary | ICD-10-CM

## 2015-06-20 DIAGNOSIS — E041 Nontoxic single thyroid nodule: Secondary | ICD-10-CM | POA: Insufficient documentation

## 2015-06-20 DIAGNOSIS — Z86718 Personal history of other venous thrombosis and embolism: Secondary | ICD-10-CM | POA: Insufficient documentation

## 2015-06-20 DIAGNOSIS — C541 Malignant neoplasm of endometrium: Secondary | ICD-10-CM | POA: Insufficient documentation

## 2015-06-20 DIAGNOSIS — J850 Gangrene and necrosis of lung: Secondary | ICD-10-CM | POA: Diagnosis not present

## 2015-06-20 DIAGNOSIS — C7962 Secondary malignant neoplasm of left ovary: Secondary | ICD-10-CM | POA: Diagnosis not present

## 2015-06-20 DIAGNOSIS — C562 Malignant neoplasm of left ovary: Secondary | ICD-10-CM

## 2015-06-20 DIAGNOSIS — E669 Obesity, unspecified: Secondary | ICD-10-CM | POA: Insufficient documentation

## 2015-06-20 DIAGNOSIS — Z86711 Personal history of pulmonary embolism: Secondary | ICD-10-CM | POA: Diagnosis not present

## 2015-06-20 DIAGNOSIS — Z8543 Personal history of malignant neoplasm of ovary: Secondary | ICD-10-CM

## 2015-06-20 DIAGNOSIS — Z9079 Acquired absence of other genital organ(s): Secondary | ICD-10-CM | POA: Diagnosis not present

## 2015-06-20 DIAGNOSIS — K219 Gastro-esophageal reflux disease without esophagitis: Secondary | ICD-10-CM | POA: Insufficient documentation

## 2015-06-20 DIAGNOSIS — Z90710 Acquired absence of both cervix and uterus: Secondary | ICD-10-CM | POA: Diagnosis not present

## 2015-06-20 DIAGNOSIS — N801 Endometriosis of ovary: Secondary | ICD-10-CM | POA: Diagnosis not present

## 2015-06-20 DIAGNOSIS — Z8542 Personal history of malignant neoplasm of other parts of uterus: Secondary | ICD-10-CM | POA: Diagnosis not present

## 2015-06-20 DIAGNOSIS — C55 Malignant neoplasm of uterus, part unspecified: Secondary | ICD-10-CM

## 2015-06-20 DIAGNOSIS — Z803 Family history of malignant neoplasm of breast: Secondary | ICD-10-CM | POA: Diagnosis not present

## 2015-06-20 DIAGNOSIS — Z90722 Acquired absence of ovaries, bilateral: Secondary | ICD-10-CM | POA: Insufficient documentation

## 2015-06-20 DIAGNOSIS — J45909 Unspecified asthma, uncomplicated: Secondary | ICD-10-CM | POA: Diagnosis not present

## 2015-06-20 LAB — COMPREHENSIVE METABOLIC PANEL
ALBUMIN: 3.9 g/dL (ref 3.5–5.0)
ALK PHOS: 114 U/L (ref 40–150)
ALT: 16 U/L (ref 0–55)
ANION GAP: 8 meq/L (ref 3–11)
AST: 24 U/L (ref 5–34)
BUN: 17.6 mg/dL (ref 7.0–26.0)
CALCIUM: 9.5 mg/dL (ref 8.4–10.4)
CO2: 27 mEq/L (ref 22–29)
Chloride: 106 mEq/L (ref 98–109)
Creatinine: 1.1 mg/dL (ref 0.6–1.1)
EGFR: 66 mL/min/{1.73_m2} — AB (ref >90)
Glucose: 95 mg/dl (ref 70–140)
POTASSIUM: 3.9 meq/L (ref 3.5–5.1)
Sodium: 140 mEq/L (ref 136–145)
Total Bilirubin: 0.62 mg/dL (ref 0.20–1.20)
Total Protein: 9.6 g/dL — ABNORMAL HIGH (ref 6.4–8.3)

## 2015-06-20 LAB — CBC WITH DIFFERENTIAL/PLATELET
BASO%: 0.2 % (ref 0.0–2.0)
BASOS ABS: 0 10*3/uL (ref 0.0–0.1)
EOS ABS: 0 10*3/uL (ref 0.0–0.5)
EOS%: 0.2 % (ref 0.0–7.0)
HEMATOCRIT: 35 % (ref 34.8–46.6)
HEMOGLOBIN: 11.8 g/dL (ref 11.6–15.9)
LYMPH#: 1.1 10*3/uL (ref 0.9–3.3)
LYMPH%: 17.5 % (ref 14.0–49.7)
MCH: 31.3 pg (ref 25.1–34.0)
MCHC: 33.7 g/dL (ref 31.5–36.0)
MCV: 92.8 fL (ref 79.5–101.0)
MONO#: 0.4 10*3/uL (ref 0.1–0.9)
MONO%: 6 % (ref 0.0–14.0)
NEUT#: 4.8 10*3/uL (ref 1.5–6.5)
NEUT%: 76.1 % (ref 38.4–76.8)
Platelets: 207 10*3/uL (ref 145–400)
RBC: 3.77 10*6/uL (ref 3.70–5.45)
RDW: 14.3 % (ref 11.2–14.5)
WBC: 6.4 10*3/uL (ref 3.9–10.3)

## 2015-06-20 NOTE — Progress Notes (Signed)
Office Visit:  GYN ONCOLOGY  Chief Complaint:  Uterine carcinosarcoma, ovarian clear cell cancer  Assessment/Plan  [name] is a 50 y.o. 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  Physical examination was notable for cervical involvement.   She was S/P IVC filter. [name] received 4 cycles of chemotherapy with resolution of the cervical lesion and no further bleeding.  Final pathology c/w two primary malignancies.  Predominantly clear cell cancer arising in the left ovarian endometriotic cyst and primary uterine carcinosarcoma. Chemotherapy completed  01/04/2014.  Vaginal cuff bracytherapy completed 03/09/2014. Caitlyn Higgins is at high risk for recurrence.  No evidence of disease Will follow clinically Follow-up  with Dr. Basilio Cairo and Darrold Span as scheduled Counselled on the signs and symptoms of recurrence Pap 02/2015 Follow-up with Dr. Nelly Rout in 11/2015    Obesity: Discussed weight maintenance and exercise   History of Present Illness  Caitlyn Higgins is a 50 y.o. who presented with a  pelvic mass, to [facility] and was transferred to [facility] 02/23/2013.   The patient feels that she was in her usual state of health until a few days prior to presentation when she noted abdominal distention and bloating. She presented to her gastroenterologist and was sent for a CT scan.  The CT 02/22/2013 demonstrated " 1. 24.1 x 16.8 x 19.9 cm complex cystic mass which appears to arise from the pelvis extending into the mid upper abdomen is highly suspicious for an ovarian neoplasm. There is evidence of early omental disease, as well as a serosal implant upon the surface of the liver, with small volume of probable malignant ascites, and potential transdiaphragmatic extension based on small  lymph nodes immediately above the diaphragm.  2. Importantly, this pelvic mass is causing severe compression of the proximal inferior vena cava, and there is a large volume of deep venous thrombosis in the visualized portion of the right thigh involving right superficial femoral, profunda femoral and common femoral veins. Additionally, there is a mass like peripheral wedge-shaped opacity in the right lower lobe. In the setting of deep venous thrombosis, this wedge-shaped opacity would be most concerning for potential pulmonary infarction. Associated with this, there is a trace right pleural effusion. 3. 5.2 x 1.8 cm heterogeneously enhancing lesion  associated with the liver capsule overlying the right lobe of the  liver "  She was transferred to [facility].    IVC filter was placed and she was started on Lovenox.  She was subsequently transitioned to warfarin.   In December 2014 an endometrial biopsy was collected. Pathology :  1. Cervix, biopsy - HIGH GRADE ENDOMETRIAL CARCINOMA, SEE COMMENT. 2. Endometrium, biopsy - HIGH GRADE ENDOMETRIAL CARCINOMA, SEE COMMENT. 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  [name]  received the 4th cycle of Taxol carboplatin therapy on 05/24/2013. She underwent interval debulking TAH BSO omentectomy 08/01/2013  Specimen(s): Left ovary and fallopian tube.  Left salpingo-oophorectomy Primary tumor site (including laterality): Left ovary Ovarian surface involvement: Present. Ovarian capsule intact without fragmentation: No. Maximum tumor size (cm): 20 cm, gross measurement. Histologic type: Mixed surface epithelial carcinoma with predominant clear cell carcinoma component (70%) and minor endometrioid component (30%) Please see comment  for detail. Grade: High grade  TNM code: [id number],  pNX FIGO Stage (based on pathologic findings, needs clinical correlation): at least IC2 Comments: Received grossly is a 20 cm focally disrupted and partially collapsed multicystic ovary with attached/adherent omentum. Microscopically, sectioned show an invasive high grade surface carcinoma arising in a background of endometriotic cyst. The tumor is predominately composed of clear cell carcinoma component with minor endometrioid carcinoma component. No tumor involvement is identified in the adherent omentum tissue. Immunohistochemical stains were performed and the clear cell carcinoma is patchy positive for p16, ER, p53, negative for WT-1 and p63. Focal angiolymphatic invasion is present.   The morphologic features are different from those seen in the uterus.  2. ONCOLOGY TABLE-UTERUS, CARCINOSARCOMA Specimen: Uterus, cervix and right fallopian tubes and ovaries. Procedure: Total hysterectomy and right salpingo-oophorectomy. Lymph node sampling performed: No. Specimen integrity: Intact. Maximum tumor size: Multiple microscopic foci, measuring up to 0.5 cm in greatest dimension. Histologic type: Carcinosarcoma (MMMT) with heterologous component (ossification) Grade: High grade Myometrial invasion: 2.1 cm where myometrium is 2.5 cm in thickness Cervical stromal involvement: No. Extent of involvement of other organs: No. Lymph - vascular invasion: Not identified.  FIGO Stage (based on pathologic findings, needs clinical correlation): IB Comment: Immunohistochemical stains were performed and the residual carcinoma is positive for p16, negative for WT-1, weakly positive for p53 and ER. The morphologic features are different from those seen in the left ovary. (HCL:gt, 08/04/13)   She has had total of 9 cycles of carboplatin and taxol thru 01-04-14.    CT 01/22/2014 IMPRESSION: 1. Postoperative changes of hysterectomy and left salpingo-oophorectomy without evidence of recurrent or  metastatic disease. 2. Scattered lymph nodes and small omental nodules are unchanged  Received vaginal brachytherapy TREATMENT DATES: 12/2, 12/7, 12/11, 12/14 and 03/09/14  SITE/DOSE: Vaginal Cuff 4 cm treatment length / 30 Gy in 5 fractions  Technique: HDR with Ir-192  Genetic testing negative  Is doing very well.   IMPRESSION: 1. No significant change in multiple prominent/mildly enlarged axillary, retroperitoneal and pelvic lymph nodes as described. 2. No recurrent ascites or peritoneal nodularity identified. 3. No evidence of solid visceral organ, pulmonary or osseous metastatic disease. 4. Stable left thyroid nodule.  Lab Results  Component Value Date   CA125 4 03/04/2015     Past Medical History  Diagnosis Date  . Reflux   . Hypertension   . DVT (deep venous thrombosis) (HCC)     right leg 12'14  . Pulmonary emboli ([place])     12'14 denies any sob  . Elevated blood uric acid level     tx. Allopurinol  . GERD (gastroesophageal reflux disease)   . Transfusion history 07-26-13    12'14, 04-14-13(2 units), 06-09-13  . Anemia   . Family history of malignant neoplasm of breast   . Asthma     endometrial ca  . Hx of radiation therapy HDR    x 5 tx  . Cancer (HCC)     uterine carcinosarcoma  . Ovarian cancer ([place]) 2015   Past Surgical History  Procedure Laterality Date  . No past surgeries    . Portacath placement Right     rigth chest- remains  . Ivc Right     filter placed to prevent Pulmonary emboli  . Abdominal hysterectomy N/A 08/01/2013    Procedure: EXPLORATORY LAPAROTOMY/HYSTERECTOMY TOTAL ABDOMINAL;  Surgeon: Janie Morning, MD;  Location: [facility];  Service: Gynecology;  Laterality: N/A;  . Salpingoophorectomy Bilateral 08/01/2013    Procedure:  BILATERAL SALPINGO OOPHORECTOMY/OMENTECTOMY ;  Surgeon: Janie Morning, MD;  Location: [facility];  Service: Gynecology;  Laterality: Bilateral;   Mammogram earlier this year within normal limits   GYN History G0 Hx of Abnormal Pap Smears: no, last pap ~2010  Hx of STIs: no  Never had regular periods.   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 This provides a significant amount of emotional fulfillment  Family History  several second degree relatives with breast cancer.   Review of Systems  Reports significant attempted weight control.  Denies cough, no hemoptysis, reports residual foot neuropathy she reports constipation. No vaginal bleeding, no rectal bleeding. No dysphagia, no vaginal bleeding, emotionally and a very good place otherwise 10 point  ROS negative  Physical Exam  BP 159/82 mmHg  Pulse 74  Temp(Src) 98.8 F (37.1 C) (Oral)  Resp 18  Ht _0  (1.702 m)  Wt 281 lb 4.8 oz (127.597 kg)  BMI 44.05 kg/m2  LMP 03/09/2013  Wt Readings from Last 3 Encounters:  06/20/15 281 lb 4.8 oz (127.597 kg)  03/06/15 283 lb 1.6 oz (128.413 kg)  03/04/15 279 lb (126.554 kg)   General: No acute distress.  HEENT: Extra occular muscles grossly intact.  Pulmonary: CTAB. Normal work of breathing.  Cardiovascular: RRR. Early systolic ejection murmur Abdomen: Soft NT.  Midline incision well healed.  No masses or hernia.  No rebound/guarding.  Musculoskeletal: Grossly normal ROM.  Extremities: Warm and well-perfused. 1+ pitting edema  Neurological: Alert and oriented x3.  Psychological: Appropriate mood/affect.  Genitourinary:  Nl EGBUS, Vagina without bleeding or discharge.  Vagina atrophic Cuff intact.  No cul de sac masses. Rectal"  Good tone no masses LN:  No cervical supraclavicular or inguinal adenopathy.

## 2015-06-20 NOTE — Patient Instructions (Signed)
Follow-up in 11/2015    Thank you very much Caitlyn Higgins for allowing me to provide care for you today.  I appreciate your confidence in choosing our Gynecologic Oncology team.  If you have any questions about your visit today please call our office and we will get back to you as soon as possible.  Please consider using the website Medlineplus.gov as an Geneticist, molecular.   Francetta Found. Azim Gillingham MD., PhD Gynecologic Oncology

## 2015-06-21 ENCOUNTER — Telehealth: Payer: Self-pay

## 2015-06-21 LAB — CANCER ANTIGEN 125 (PARALLEL TESTING): CA 125: 4 U/mL (ref <35)

## 2015-06-21 LAB — CA 125: Cancer Antigen (CA) 125: 6.8 U/mL (ref 0.0–38.1)

## 2015-06-21 NOTE — Telephone Encounter (Signed)
LM in Caitlyn Higgins's vm stating the results of labs as noted below by Dr. Marko Plume. She can call back to Dr. Mariana Kaufman office  if she has any questions or concerns.

## 2015-06-21 NOTE — Telephone Encounter (Signed)
-----   Message from Gordy Levan, MD sent at 06/21/2015  8:12 AM EDT ----- Labs seen and need follow up please let her know CA 125 is in good low range and other labs also looked good on 3-30

## 2015-07-25 ENCOUNTER — Other Ambulatory Visit: Payer: Self-pay | Admitting: Oncology

## 2015-07-25 ENCOUNTER — Ambulatory Visit: Payer: No Typology Code available for payment source | Admitting: Gynecologic Oncology

## 2015-07-25 ENCOUNTER — Telehealth: Payer: Self-pay | Admitting: Oncology

## 2015-07-25 ENCOUNTER — Other Ambulatory Visit: Payer: No Typology Code available for payment source

## 2015-07-25 NOTE — Telephone Encounter (Signed)
Left voicemail to inform patient of cxld lab appt for 5/4 per LL pof

## 2015-08-30 ENCOUNTER — Telehealth: Payer: Self-pay | Admitting: Oncology

## 2015-08-30 ENCOUNTER — Other Ambulatory Visit: Payer: Self-pay | Admitting: Oncology

## 2015-08-30 NOTE — Telephone Encounter (Signed)
cld & spoke to pt and gave pt time & date of r/s appt per Dr Marko Plume 7/3@12 :74

## 2015-09-13 ENCOUNTER — Other Ambulatory Visit: Payer: Self-pay | Admitting: Oncology

## 2015-09-13 DIAGNOSIS — C562 Malignant neoplasm of left ovary: Secondary | ICD-10-CM

## 2015-09-16 ENCOUNTER — Other Ambulatory Visit: Payer: No Typology Code available for payment source

## 2015-09-16 ENCOUNTER — Ambulatory Visit: Payer: No Typology Code available for payment source | Admitting: Oncology

## 2015-09-20 ENCOUNTER — Ambulatory Visit
Admission: RE | Admit: 2015-09-20 | Discharge: 2015-09-20 | Disposition: A | Discharge status: Home / Self Care | Payer: No Typology Code available for payment source | Source: Ambulatory Visit | Attending: Radiation Oncology | Admitting: Radiation Oncology

## 2015-09-22 ENCOUNTER — Other Ambulatory Visit: Payer: Self-pay | Admitting: Oncology

## 2015-09-23 ENCOUNTER — Ambulatory Visit (HOSPITAL_BASED_OUTPATIENT_CLINIC_OR_DEPARTMENT_OTHER): Payer: BLUE CROSS/BLUE SHIELD | Admitting: Oncology

## 2015-09-23 ENCOUNTER — Other Ambulatory Visit (HOSPITAL_BASED_OUTPATIENT_CLINIC_OR_DEPARTMENT_OTHER): Payer: BLUE CROSS/BLUE SHIELD

## 2015-09-23 ENCOUNTER — Telehealth: Payer: Self-pay | Admitting: Oncology

## 2015-09-23 ENCOUNTER — Encounter: Payer: Self-pay | Admitting: Oncology

## 2015-09-23 VITALS — BP 146/95 | HR 70 | Temp 98.0°F | Resp 18 | Ht 67.0 in | Wt 280.7 lb

## 2015-09-23 DIAGNOSIS — D649 Anemia, unspecified: Secondary | ICD-10-CM | POA: Diagnosis not present

## 2015-09-23 DIAGNOSIS — Z95828 Presence of other vascular implants and grafts: Secondary | ICD-10-CM

## 2015-09-23 DIAGNOSIS — C562 Malignant neoplasm of left ovary: Secondary | ICD-10-CM

## 2015-09-23 DIAGNOSIS — Z8541 Personal history of malignant neoplasm of cervix uteri: Secondary | ICD-10-CM

## 2015-09-23 DIAGNOSIS — E669 Obesity, unspecified: Secondary | ICD-10-CM | POA: Diagnosis not present

## 2015-09-23 DIAGNOSIS — C55 Malignant neoplasm of uterus, part unspecified: Secondary | ICD-10-CM

## 2015-09-23 DIAGNOSIS — G620 Drug-induced polyneuropathy: Secondary | ICD-10-CM

## 2015-09-23 DIAGNOSIS — T451X5A Adverse effect of antineoplastic and immunosuppressive drugs, initial encounter: Secondary | ICD-10-CM

## 2015-09-23 LAB — CBC WITH DIFFERENTIAL/PLATELET
BASO%: 0.5 % (ref 0.0–2.0)
BASOS ABS: 0 10*3/uL (ref 0.0–0.1)
EOS%: 0.3 % (ref 0.0–7.0)
Eosinophils Absolute: 0 10*3/uL (ref 0.0–0.5)
HEMATOCRIT: 34.3 % — AB (ref 34.8–46.6)
HGB: 11.3 g/dL — ABNORMAL LOW (ref 11.6–15.9)
LYMPH#: 1.3 10*3/uL (ref 0.9–3.3)
LYMPH%: 21.6 % (ref 14.0–49.7)
MCH: 30.9 pg (ref 25.1–34.0)
MCHC: 32.9 g/dL (ref 31.5–36.0)
MCV: 93.7 fL (ref 79.5–101.0)
MONO#: 0.4 10*3/uL (ref 0.1–0.9)
MONO%: 6.4 % (ref 0.0–14.0)
NEUT#: 4.1 10*3/uL (ref 1.5–6.5)
NEUT%: 71.2 % (ref 38.4–76.8)
Platelets: 214 10*3/uL (ref 145–400)
RBC: 3.66 10*6/uL — AB (ref 3.70–5.45)
RDW: 15.2 % — ABNORMAL HIGH (ref 11.2–14.5)
WBC: 5.8 10*3/uL (ref 3.9–10.3)

## 2015-09-23 LAB — COMPREHENSIVE METABOLIC PANEL
ALT: 23 U/L (ref 0–55)
ANION GAP: 10 meq/L (ref 3–11)
AST: 26 U/L (ref 5–34)
Albumin: 3.6 g/dL (ref 3.5–5.0)
Alkaline Phosphatase: 112 U/L (ref 40–150)
BUN: 10.2 mg/dL (ref 7.0–26.0)
CALCIUM: 9.5 mg/dL (ref 8.4–10.4)
CHLORIDE: 105 meq/L (ref 98–109)
CO2: 25 meq/L (ref 22–29)
CREATININE: 1.1 mg/dL (ref 0.6–1.1)
EGFR: 67 mL/min/{1.73_m2} — ABNORMAL LOW (ref >90)
Glucose: 121 mg/dl (ref 70–140)
POTASSIUM: 4 meq/L (ref 3.5–5.1)
Sodium: 139 mEq/L (ref 136–145)
Total Bilirubin: 0.64 mg/dL (ref 0.20–1.20)
Total Protein: 9.3 g/dL — ABNORMAL HIGH (ref 6.4–8.3)

## 2015-09-23 MED ORDER — GABAPENTIN 100 MG PO CAPS: 100.0000 mg | ORAL_CAPSULE | Freq: Every day | ORAL | Status: DC
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
## 2015-09-23 NOTE — Telephone Encounter (Signed)
Gave and printed appt sched and avs for pt for Sept and DEC °

## 2015-09-24 DIAGNOSIS — T451X5A Adverse effect of antineoplastic and immunosuppressive drugs, initial encounter: Secondary | ICD-10-CM

## 2015-09-24 DIAGNOSIS — G620 Drug-induced polyneuropathy: Secondary | ICD-10-CM | POA: Insufficient documentation

## 2015-09-24 LAB — CANCER ANTIGEN 125 (PARALLEL TESTING): CA 125: 5 U/mL (ref <35)

## 2015-09-24 LAB — CA 125: Cancer Antigen (CA) 125: 7.1 U/mL (ref 0.0–38.1)

## 2015-09-25 ENCOUNTER — Telehealth: Payer: Self-pay

## 2015-09-25 NOTE — Telephone Encounter (Signed)
lvm per Dr LL attached message. 

## 2015-09-25 NOTE — Telephone Encounter (Signed)
-----   Message from Gordy Levan, MD sent at 09/24/2015  3:26 PM EDT ----- Labs seen and need follow up: please let her know CA 125 still in good low range   Keep apts as planned

## 2015-09-27 ENCOUNTER — Ambulatory Visit
Admission: RE | Admit: 2015-09-27 | Discharge: 2015-09-27 | Disposition: A | Discharge status: Home / Self Care | Payer: BLUE CROSS/BLUE SHIELD | Source: Ambulatory Visit | Attending: Radiation Oncology | Admitting: Radiation Oncology

## 2015-09-27 ENCOUNTER — Encounter: Payer: Self-pay | Admitting: Radiation Oncology

## 2015-09-27 VITALS — BP 142/90 | HR 72 | Temp 98.7°F | Ht 67.0 in | Wt 283.6 lb

## 2015-09-27 DIAGNOSIS — C541 Malignant neoplasm of endometrium: Secondary | ICD-10-CM

## 2015-09-27 DIAGNOSIS — C569 Malignant neoplasm of unspecified ovary: Secondary | ICD-10-CM | POA: Insufficient documentation

## 2015-09-27 NOTE — Progress Notes (Signed)
Radiation Oncology         [phone] ________________________________  Name: Caitlyn Higgins MRN: [id number]  Date: 09/27/2015  DOB: 27-Apr-1965  Follow-Up Visit Note  Outpatient  CC: Shirline Frees, MD  Janie Morning, MD  Diagnosis and Prior Radiotherapy:    ICD-9-CM ICD-10-CM   1. Endometrial cancer ([place]) 182.0 C54.1     DIAGNOSIS: Stage II uterine carcinosarcoma (patient also had ovarian cancer, not treated with RT)   INDICATION FOR TREATMENT: [facility] TREATMENT DATES:  12/2, 12/7, 12/11, 12/14 and 03/09/14                     SITE/DOSE: Vaginal Cuff 4 cm treatment length  / 30 Gy in 5 fractions                     Technique:   HDR with Ir-192                                          Narrative:  Caitlyn Higgins is here for follow up of radiation completed 03/09/14 to her Vaginal Cuff. She denies pain. She denies any bowel or urinary problems. She denies any vaginal discharge, or bleeding. She is not using the vaginal dilator, but reports she is sexually active. She denies pain during intercourse. Her last Pap smear is reported to be 03/06/15. She saw Dr. Skeet Latch on March 30th with no sign of recurrence.  She sees her again in two months.  She saw Dr. Marko Plume four days ago without signs of recurrence either but didn't have a GYN exam. Patient was prescribed gabapentin for symptoms in her feet, particularly numbness in her right right toe. On December 14th, 2016 pap smear was negative for malignancy.     ALLERGIES:  has No Known Allergies.  Meds: Current Outpatient Prescriptions  Medication Sig Dispense Refill  . allopurinol (ZYLOPRIM) 100 MG tablet Take 1 tablet (100 mg total) by mouth daily. 30 tablet 0  . cetirizine (ZYRTEC) 10 MG tablet Take 10 mg by mouth daily.    Marland Kitchen levothyroxine (SYNTHROID, LEVOTHROID) 50 MCG tablet TAKE 1 TABLET BY MOUTH ONCE DAILY 30 tablet 2  . omeprazole (PRILOSEC) 40 MG capsule Take 1 capsule (40 mg total) by mouth daily. 90 capsule 2  .  gabapentin (NEURONTIN) 100 MG capsule Take 1 capsule (100 mg total) by mouth at bedtime. For neuropathy. (Patient not taking: Reported on 09/27/2015) 30 capsule 0   No current facility-administered medications for this encounter.    Physical Findings: The patient is in no acute distress. Patient is alert and oriented.  height is 5\' 7"  (1.702 m) and weight is 283 lb 9.6 oz (128.64 kg). Her temperature is 98.7 F (37.1 C). Her blood pressure is 142/90 and her pulse is 72.   General: Alert and oriented, in no acute distress HEENT: Head is normocephalic.  NECK: no palpable adenopathy Abdomen: Soft, nontender Extremities: No edema. Lymphatics:  Groin is w/o adenopathy. Skin: No concerning lesions. Psychiatric: Judgment and insight are intact. Affect is appropriate. GYN: External genitalia appears normal. No lesions appreciated within the vaginal cuff upon digital or speculum exam. No rectovaginal fistulas    Lab Findings: Lab Results  Component Value Date   WBC 5.8 09/23/2015   HGB 11.3* 09/23/2015   HCT 34.3* 09/23/2015   MCV 93.7 09/23/2015   PLT 214 09/23/2015  Radiographic Findings: No results found.  Impression/Plan:   No evidence of disease. I will order follow up for her to see me around late March 2018.  _____________________________________   Eppie Gibson, MD   This document serves as a record of services personally performed by Eppie Gibson , MD. It was created on his behalf by Truddie Hidden, a trained medical scribe. The creation of this record is based on the scribe's personal observations and the provider's statements to them. This document has been checked and approved by the attending provider.

## 2015-09-27 NOTE — Progress Notes (Signed)
Caitlyn Higgins is here for follow up of radiation completed 03/09/14 to her Vaginal Cuff. She denies pain. She denies any bowel or urinary problems. She denies any vaginal discharge, or bleeding. She is not using the vaginal dilator, but reports she is sexually active. She denies pain during intercourse. Her last Pap smear is reported to be 03/06/15.  BP 142/90 mmHg  Pulse 72  Temp(Src) 98.7 F (37.1 C)  Ht 5\' 7"  (1.702 m)  Wt 283 lb 9.6 oz (128.64 kg)  BMI 44.41 kg/m2  LMP 03/09/2013   Wt Readings from Last 3 Encounters:  09/27/15 283 lb 9.6 oz (128.64 kg)  09/23/15 280 lb 11.2 oz (127.325 kg)  06/20/15 281 lb 4.8 oz (127.597 kg)

## 2015-09-30 ENCOUNTER — Telehealth: Payer: Self-pay | Admitting: *Deleted

## 2015-09-30 NOTE — Telephone Encounter (Signed)
CALLED PATIENT TO INFORM OF FU WITH DR. Isidore Moos ON 06-05-16 @ 11 AM, LVM FOR A RETURN CALL

## 2015-10-21 ENCOUNTER — Telehealth: Payer: Self-pay

## 2015-10-21 NOTE — Telephone Encounter (Signed)
Ms [name] requesting a letter to be excused from jury duty duet to the neuropathy.  She states that walking to the building in [employer] is further then in [place] from parking. Told her that Dr. Marko Plume would not write a letter for jury duty. Ms [name] stated that she would ask Dr. Skeet Latch for letter. Inquired to see if the Neurontin 100 mg at hs was helping with the neuropathy.  Prescribed on 09-23-15 # 30 tabs NO refill .  Pt had not pick up by Dr. Pearlie Oyster appointment 09-27-15. Pt is currently taking neurontin .  Ms [name] states that it is not helping with the neuropathy as much as she thought it would.  Told her that this information would be given to Dr. Marko Plume to see about increasing the neurontin.  Pt in agreement with this plan.

## 2015-10-22 NOTE — Telephone Encounter (Signed)
Spoke with Caitlyn Higgins and told her the increase the neurontin to 300 mg at hs as noted below by Dr. Marko Plume.  Caitlyn. Jim Higgins stated that she has done some research on the medication and she said that it can cause weight gain. She is not looking to gain weight.   Is there another medication that Dr. Marko Plume can recommend that does not have this side effect? She has notice an increase in her appetite since she began the neurontin.  She feels she has not been on it long enough to see weight gain.

## 2015-10-22 NOTE — Telephone Encounter (Signed)
  Caitlyn Higgins Female, 50 y.o., 05-26-1965 Weight:  283 lb 9.6 oz (128.6 kg) Phone:  [phone] PCP:  Shirline Frees, MD [place] MRN:  [id number] MyChart:  Pending Next Appt:  11/11/2015 Message  Received: Today  Message Contents  Gordy Levan, MD  Baruch Merl, RN        Suggest increasing neurontin to 300 mg at hs  Ask patient to let us know how this is working in ~ 2 weeks. When needs refill, ok to rewrite as 300 mg tab if she prefers  thanks

## 2015-11-05 NOTE — Telephone Encounter (Signed)
Told Ms Compton that Dr. Marko Plume stated that she does not think that the gabapentin causes weight gain. A person needs to decrease calories and increase exercise to lose weight. Told her about the healthy eating class for weight loss that the dietitian Dory Peru has started.   Ms Com[pton stated that the dietitian called her yesterday. She is interested in the the class but she is not able to come on tuesdays.  Ms [name] has many of the gabapentin 100 mg tabs left. Told her that Dr. Marko Plume recommends increasing dose to 300 mg at hs and let her know how she is doing with that dose after a couple of weeks before trying something else.  Ms [name] will have to think about doing this.  She will call back with update if she tries  The increased dose of gabapentin.

## 2015-11-11 ENCOUNTER — Ambulatory Visit
Admission: RE | Admit: 2015-11-11 | Discharge: 2015-11-11 | Disposition: A | Discharge status: Home / Self Care | Payer: BLUE CROSS/BLUE SHIELD | Source: Ambulatory Visit | Attending: Oncology | Admitting: Oncology

## 2015-11-11 DIAGNOSIS — C562 Malignant neoplasm of left ovary: Secondary | ICD-10-CM

## 2015-11-21 ENCOUNTER — Other Ambulatory Visit: Payer: No Typology Code available for payment source

## 2015-11-21 ENCOUNTER — Ambulatory Visit: Payer: No Typology Code available for payment source | Admitting: Oncology

## 2015-11-27 ENCOUNTER — Telehealth: Payer: Self-pay | Admitting: Gynecologic Oncology

## 2015-11-27 NOTE — Telephone Encounter (Signed)
Returned call to patient.  She states she does not feel she will be able to attend due to moderate neuropathy which she takes Neurontin for which makes her sleepy.  Dr. Marko Plume said she should be able to go since she will be sitting but Mrs. Compton states the neuropathy bothers her at all times of the day even when sitting.  She wanted to see if Dr. Skeet Latch would give permission to excuse her from this.  Advised patient that a message would be sent to Dr. Skeet Latch at [facility] and the patient would be contacted with her recommendations and/or answer about jury duty excuse.  Patient verbalizing understanding.  Advised to call for any questions or concerns in between that time.

## 2015-11-27 NOTE — Telephone Encounter (Signed)
Called the patient and informed her of Dr. Leone Brand response below:  "I can't get her out of [place] Duty for neuropathy. Perhaps she can decrease her dose or tell the judge that the medicine makes her sleepy:  Patient verbalizing understanding and advised to call for any needs or concerns.

## 2015-12-19 ENCOUNTER — Ambulatory Visit: Payer: BLUE CROSS/BLUE SHIELD | Admitting: Gynecologic Oncology

## 2015-12-19 ENCOUNTER — Other Ambulatory Visit: Payer: BLUE CROSS/BLUE SHIELD

## 2016-02-18 ENCOUNTER — Other Ambulatory Visit (HOSPITAL_COMMUNITY)
Admission: RE | Admit: 2016-02-18 | Discharge: 2016-02-18 | Disposition: A | Discharge status: Home / Self Care | Payer: BLUE CROSS/BLUE SHIELD | Source: Ambulatory Visit | Attending: Gynecologic Oncology | Admitting: Gynecologic Oncology

## 2016-02-18 ENCOUNTER — Other Ambulatory Visit: Payer: Self-pay | Admitting: Gynecologic Oncology

## 2016-02-18 DIAGNOSIS — C562 Malignant neoplasm of left ovary: Secondary | ICD-10-CM

## 2016-02-18 DIAGNOSIS — Z01411 Encounter for gynecological examination (general) (routine) with abnormal findings: Secondary | ICD-10-CM | POA: Insufficient documentation

## 2016-02-18 DIAGNOSIS — C541 Malignant neoplasm of endometrium: Secondary | ICD-10-CM

## 2016-02-18 NOTE — Progress Notes (Signed)
Office Visit:  GYN ONCOLOGY  Chief Complaint:  Uterine carcinosarcoma, ovarian clear cell cancer  Assessment/Plan  [name] is a 50 y.o. with  two primary malignancies.  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 Chemotherapy completed  01/04/2014.  Interval hysterectomy BSO omentectomy. Vaginal cuff bracytherapy completed 03/09/2014. [name] is at high risk for recurrence.  No evidence of disease Will follow clinically Follow-up  with Dr. Isidore Moos and Marko Plume as scheduled Counselled on the signs and symptoms of recurrence Pap collected F/U 08/2016   Obesity: Discussed weight loss and exercise   History of Present Illness  Caitlyn Higgins is a 50 y.o. who presented with a  pelvic mass, to [facility] and was transferred to [facility] 02/23/2013.   The patient feels that she was in her usual state of health until a few days prior to presentation when she noted abdominal distention and bloating. She presented to her gastroenterologist and was sent for a CT scan.  The CT 02/22/2013 demonstrated " 1. 24.1 x 16.8 x 19.9 cm complex cystic mass which appears to arise from the pelvis extending into the mid upper abdomen is highly suspicious for an ovarian neoplasm. There is evidence of early omental disease, as well as a serosal implant upon the surface of the liver, with small volume of probable malignant ascites, and potential transdiaphragmatic extension based on small lymph nodes immediately above the diaphragm.  2. Importantly, this pelvic mass is causing severe compression of the proximal inferior vena cava, and there is a large volume of deep venous thrombosis in the visualized portion of the right thigh involving right superficial femoral, profunda femoral and common femoral veins. Additionally, there is a mass like peripheral wedge-shaped opacity in the right lower lobe. In the setting of deep  venous thrombosis, this wedge-shaped opacity would be most concerning for potential pulmonary infarction. Associated with this, there is a trace right pleural effusion. 3. 5.2 x 1.8 cm heterogeneously enhancing lesion  associated with the liver capsule overlying the right lobe of the  liver "  She was transferred to [facility].    IVC filter was placed and she was started on Lovenox.  She was subsequently transitioned to warfarin.   In December 2014 an endometrial biopsy was collected. Pathology :  1. 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  Ms Stevan Higgins  received the 4th cycle of Taxol carboplatin therapy on 05/24/2013. She underwent interval debulking TAH BSO omentectomy 08/01/2013  Specimen(s): Left ovary and fallopian tube.  Left salpingo-oophorectomy Primary tumor site (including laterality): Left ovary Ovarian surface involvement: Present. Ovarian capsule intact without fragmentation: No. Maximum tumor size (cm): 20 cm, gross measurement. Histologic type: Mixed surface epithelial carcinoma with predominant clear cell carcinoma component (70%) and minor endometrioid component (30%) Please see comment for detail. Grade: High grade  TNM code: [id number], pNXMicroscopically, sectioned show an invasive high grade surface carcinoma arising in a background of endometriotic cyst. The tumor is predominately composed of clear cell carcinoma component with minor endometrioid carcinoma component. No tumor involvement is identified in the adherent omentum tissue. Immunohistochemical stains were performed and the clear cell carcinoma is patchy positive for p16, ER, p53, negative for WT-1 and p63. Focal angiolymphatic invasion is present.   The morphologic features are different from those seen in the uterus.  2. ONCOLOGY TABLE-UTERUS, CARCINOSARCOMA  Specimen:  Uterus, cervix and right fallopian tubes and ovaries. Procedure: Total hysterectomy and right salpingo-oophorectomy. Lymph node sampling performed: No. Specimen integrity: Intact. Maximum tumor size: Multiple microscopic foci, measuring up to 0.5 cm in greatest dimension. Histologic type: Carcinosarcoma (MMMT) with heterologous component (ossification) Grade: High grade Myometrial invasion: 2.1 cm where myometrium is 2.5 cm in thickness Cervical stromal involvement: No. Extent of involvement of other organs: No. Lymph - vascular invasion: Not identified.  FIGO Stage (based on pathologic findings, needs clinical correlation): IB Comment: Immunohistochemical stains were performed and the residual carcinoma is positive for p16, negative for WT-1, weakly positive for p53 and ER. The morphologic features are different from those seen in the left ovary. (HCL:gt, 08/04/13)   She has had total of 9 cycles of carboplatin and taxol thru 01-04-14.    CT 01/22/2014 IMPRESSION: 1. Postoperative changes of hysterectomy and left salpingo-oophorectomy without evidence of recurrent or metastatic disease. 2. Scattered lymph nodes and small omental nodules are unchanged  Received vaginal brachytherapy TREATMENT DATES: 12/2, 12/7, 12/11, 12/14 and 03/09/14  SITE/DOSE: Vaginal Cuff 4 cm treatment length / 30 Gy in 5 fractions  Technique: HDR with Ir-192  Genetic testing negative  Interval History. Stable RLQ intermittent discomfort.  Unchanged since surgery.  Lab Results  Component Value Date   CA125 5 09/23/2015     Past Medical History:  Diagnosis Date  . Anemia   . Asthma    endometrial ca  . Cancer (HCC)    uterine carcinosarcoma  . DVT (deep venous thrombosis) (HCC)    right leg 12'14  . Elevated blood uric acid level    tx. Allopurinol  . Family history of malignant neoplasm of breast   . GERD (gastroesophageal  reflux disease)   . Hx of radiation therapy HDR   x 5 tx  . Hypertension   . Ovarian cancer ([place]) 2015  . Pulmonary emboli ([place])    12'14 denies any sob  . Reflux   . Transfusion history 07-26-13   12'14, 04-14-13(2 units), 06-09-13   Past Surgical History:  Procedure Laterality Date  . ABDOMINAL HYSTERECTOMY N/A 08/01/2013   Procedure: EXPLORATORY LAPAROTOMY/HYSTERECTOMY TOTAL ABDOMINAL;  Surgeon: Janie Morning, MD;  Location: [facility];  Service: Gynecology;  Laterality: N/A;  . IVC Right    filter placed to prevent Pulmonary emboli  . NO PAST SURGERIES    . PORTACATH PLACEMENT Right    rigth chest- remains  . SALPINGOOPHORECTOMY Bilateral 08/01/2013   Procedure: BILATERAL SALPINGO OOPHORECTOMY/OMENTECTOMY ;  Surgeon: Janie Morning, MD;  Location: [facility];  Service: Gynecology;  Laterality: Bilateral;  Mammogram earlier this year within normal limits   GYN History G0 Hx of Abnormal Pap Smears: no, last pap ~2010  Hx of STIs: no  Never had regular periods.   Social Hx:   As taken the responsibility of providing care for a foster son This provides a significant amount of emotional fulfillment  Family History  several second degree relatives with breast cancer.   Review of Systems  Reports significant attempted weight control.  Denies cough, no hemoptysis, reports residual foot neuropathy she reports constipation. No vaginal bleeding, no rectal bleeding. No dysphagia, no vaginal bleeding, emotionally and a very good place otherwise 10 point  ROS negative  Physical Exam    Wt Readings from Last 3 Encounters:  09/27/15 283 lb 9.6 oz (128.6 kg)  09/23/15 280 lb 11.2 oz (127.3 kg)  06/20/15 281 lb 4.8 oz (127.6 kg)   General: No  acute distress.  HEENT: Extra occular muscles grossly intact.  Pulmonary: CTAB.  Cardiovascular: RRR. Early systolic ejection murmur Abdomen: Soft NT.  Midline incision well healed.  No masses or hernia.  No rebound/guarding. Mass in RLQ at the site of  lovenox nijection Musculoskeletal: Grossly normal ROM.  Extremities: Warm and well-perfused. 1+ pitting edema  Neurological: Alert and oriented x3.  Psychological: Appropriate mood/affect.  Genitourinary:  Nl EGBUS, Vagina without bleeding or discharge.  Vagina atrophic Cuff intact.  No cul de sac masses. Pap collected Rectal"  Good tone no masses LN:  No cervical supraclavicular or inguinal adenopathy.

## 2016-02-20 ENCOUNTER — Ambulatory Visit: Payer: BLUE CROSS/BLUE SHIELD | Attending: Gynecologic Oncology | Admitting: Gynecologic Oncology

## 2016-02-20 DIAGNOSIS — C562 Malignant neoplasm of left ovary: Secondary | ICD-10-CM | POA: Diagnosis not present

## 2016-02-20 DIAGNOSIS — Z86711 Personal history of pulmonary embolism: Secondary | ICD-10-CM | POA: Insufficient documentation

## 2016-02-20 DIAGNOSIS — E669 Obesity, unspecified: Secondary | ICD-10-CM | POA: Insufficient documentation

## 2016-02-20 DIAGNOSIS — K219 Gastro-esophageal reflux disease without esophagitis: Secondary | ICD-10-CM | POA: Diagnosis not present

## 2016-02-20 DIAGNOSIS — Z8543 Personal history of malignant neoplasm of ovary: Secondary | ICD-10-CM | POA: Diagnosis not present

## 2016-02-20 DIAGNOSIS — Z9889 Other specified postprocedural states: Secondary | ICD-10-CM | POA: Insufficient documentation

## 2016-02-20 DIAGNOSIS — Z803 Family history of malignant neoplasm of breast: Secondary | ICD-10-CM | POA: Diagnosis not present

## 2016-02-20 DIAGNOSIS — Z90722 Acquired absence of ovaries, bilateral: Secondary | ICD-10-CM | POA: Insufficient documentation

## 2016-02-20 DIAGNOSIS — Z9221 Personal history of antineoplastic chemotherapy: Secondary | ICD-10-CM | POA: Insufficient documentation

## 2016-02-20 DIAGNOSIS — C541 Malignant neoplasm of endometrium: Secondary | ICD-10-CM | POA: Insufficient documentation

## 2016-02-20 DIAGNOSIS — Z90710 Acquired absence of both cervix and uterus: Secondary | ICD-10-CM | POA: Insufficient documentation

## 2016-02-20 DIAGNOSIS — E6609 Other obesity due to excess calories: Secondary | ICD-10-CM

## 2016-02-20 DIAGNOSIS — Z923 Personal history of irradiation: Secondary | ICD-10-CM | POA: Diagnosis not present

## 2016-02-20 DIAGNOSIS — I10 Essential (primary) hypertension: Secondary | ICD-10-CM | POA: Diagnosis not present

## 2016-02-20 NOTE — Patient Instructions (Signed)
We will contact you with the results of your pap smear from today.  Plan to follow up in June or sooner if needed.

## 2016-02-26 LAB — CYTOLOGY - PAP: DIAGNOSIS: NEGATIVE

## 2016-02-28 ENCOUNTER — Telehealth: Payer: Self-pay

## 2016-02-28 NOTE — Telephone Encounter (Signed)
Pt informed of normal test result. Verbalized understanding. 

## 2016-03-08 ENCOUNTER — Other Ambulatory Visit: Payer: Self-pay | Admitting: Oncology

## 2016-03-08 DIAGNOSIS — C562 Malignant neoplasm of left ovary: Secondary | ICD-10-CM

## 2016-03-08 DIAGNOSIS — C55 Malignant neoplasm of uterus, part unspecified: Secondary | ICD-10-CM

## 2016-03-09 ENCOUNTER — Other Ambulatory Visit (HOSPITAL_BASED_OUTPATIENT_CLINIC_OR_DEPARTMENT_OTHER): Payer: BLUE CROSS/BLUE SHIELD

## 2016-03-09 ENCOUNTER — Ambulatory Visit (HOSPITAL_BASED_OUTPATIENT_CLINIC_OR_DEPARTMENT_OTHER): Payer: BLUE CROSS/BLUE SHIELD | Admitting: Oncology

## 2016-03-09 VITALS — BP 137/75 | HR 79 | Temp 97.9°F | Resp 18 | Ht 67.0 in | Wt 291.5 lb

## 2016-03-09 DIAGNOSIS — Z8542 Personal history of malignant neoplasm of other parts of uterus: Secondary | ICD-10-CM

## 2016-03-09 DIAGNOSIS — IMO0001 Reserved for inherently not codable concepts without codable children: Secondary | ICD-10-CM

## 2016-03-09 DIAGNOSIS — Z95828 Presence of other vascular implants and grafts: Secondary | ICD-10-CM

## 2016-03-09 DIAGNOSIS — C562 Malignant neoplasm of left ovary: Secondary | ICD-10-CM

## 2016-03-09 DIAGNOSIS — C55 Malignant neoplasm of uterus, part unspecified: Secondary | ICD-10-CM

## 2016-03-09 LAB — CBC WITH DIFFERENTIAL/PLATELET
BASO%: 0.2 % (ref 0.0–2.0)
BASOS ABS: 0 10*3/uL (ref 0.0–0.1)
EOS%: 0.3 % (ref 0.0–7.0)
Eosinophils Absolute: 0 10*3/uL (ref 0.0–0.5)
HEMATOCRIT: 32.7 % — AB (ref 34.8–46.6)
HEMOGLOBIN: 11 g/dL — AB (ref 11.6–15.9)
LYMPH#: 1.5 10*3/uL (ref 0.9–3.3)
LYMPH%: 23.8 % (ref 14.0–49.7)
MCH: 30.6 pg (ref 25.1–34.0)
MCHC: 33.6 g/dL (ref 31.5–36.0)
MCV: 91.1 fL (ref 79.5–101.0)
MONO#: 0.3 10*3/uL (ref 0.1–0.9)
MONO%: 5.5 % (ref 0.0–14.0)
NEUT#: 4.3 10*3/uL (ref 1.5–6.5)
NEUT%: 70.2 % (ref 38.4–76.8)
PLATELETS: 203 10*3/uL (ref 145–400)
RBC: 3.59 10*6/uL — ABNORMAL LOW (ref 3.70–5.45)
RDW: 14.3 % (ref 11.2–14.5)
WBC: 6.1 10*3/uL (ref 3.9–10.3)

## 2016-03-09 LAB — COMPREHENSIVE METABOLIC PANEL
ALBUMIN: 3.3 g/dL — AB (ref 3.5–5.0)
ALK PHOS: 111 U/L (ref 40–150)
ALT: 20 U/L (ref 0–55)
ANION GAP: 8 meq/L (ref 3–11)
AST: 25 U/L (ref 5–34)
BUN: 16.8 mg/dL (ref 7.0–26.0)
CALCIUM: 9.2 mg/dL (ref 8.4–10.4)
CO2: 26 mEq/L (ref 22–29)
CREATININE: 1.1 mg/dL (ref 0.6–1.1)
Chloride: 107 mEq/L (ref 98–109)
EGFR: 70 mL/min/{1.73_m2} — ABNORMAL LOW (ref >90)
Glucose: 98 mg/dl (ref 70–140)
Potassium: 3.7 mEq/L (ref 3.5–5.1)
Sodium: 142 mEq/L (ref 136–145)
Total Bilirubin: 0.6 mg/dL (ref 0.20–1.20)
Total Protein: 8.6 g/dL — ABNORMAL HIGH (ref 6.4–8.3)
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
## 2016-03-10 ENCOUNTER — Encounter: Payer: Self-pay | Admitting: Oncology

## 2016-03-10 LAB — CA 125: CANCER ANTIGEN (CA) 125: 7 U/mL (ref 0.0–38.1)

## 2016-03-11 ENCOUNTER — Telehealth: Payer: Self-pay

## 2016-03-11 NOTE — Telephone Encounter (Signed)
-----   Message from Gordy Levan, MD sent at 03/10/2016 10:36 AM EST ----- Labs seen and need follow up: please let her know CA 125 marker is still in good low range at 7.0

## 2016-03-11 NOTE — Telephone Encounter (Signed)
LM in Ms Compton's vm regarding the results of the CA-125 as noted below by Dr. Marko Plume.

## 2016-03-14 ENCOUNTER — Other Ambulatory Visit: Payer: Self-pay | Admitting: Nurse Practitioner

## 2016-03-17 ENCOUNTER — Other Ambulatory Visit: Payer: Self-pay | Admitting: Nurse Practitioner

## 2016-06-03 ENCOUNTER — Encounter: Payer: Self-pay | Admitting: Radiation Oncology

## 2016-06-05 ENCOUNTER — Ambulatory Visit
Admission: RE | Admit: 2016-06-05 | Payer: BLUE CROSS/BLUE SHIELD | Source: Ambulatory Visit | Admitting: Radiation Oncology

## 2016-07-08 ENCOUNTER — Ambulatory Visit: Payer: BLUE CROSS/BLUE SHIELD | Admitting: Radiation Oncology

## 2016-08-07 ENCOUNTER — Ambulatory Visit: Payer: BLUE CROSS/BLUE SHIELD | Admitting: Radiation Oncology

## 2016-08-10 ENCOUNTER — Telehealth: Payer: Self-pay | Admitting: *Deleted

## 2016-08-10 NOTE — Telephone Encounter (Signed)
Per patient request I have moved the appt from June 7th to July 5th. Patient aware of the new date/time

## 2016-08-14 ENCOUNTER — Ambulatory Visit
Admission: RE | Admit: 2016-08-14 | Discharge: 2016-08-14 | Disposition: A | Discharge status: Home / Self Care | Payer: BLUE CROSS/BLUE SHIELD | Source: Ambulatory Visit | Attending: Radiation Oncology | Admitting: Radiation Oncology

## 2016-08-14 ENCOUNTER — Encounter: Payer: Self-pay | Admitting: Radiation Oncology

## 2016-08-14 VITALS — BP 125/104 | Temp 98.4°F | Ht 67.0 in | Wt 292.6 lb

## 2016-08-14 DIAGNOSIS — C541 Malignant neoplasm of endometrium: Secondary | ICD-10-CM | POA: Insufficient documentation

## 2016-08-14 DIAGNOSIS — Z79899 Other long term (current) drug therapy: Secondary | ICD-10-CM | POA: Diagnosis not present

## 2016-08-14 DIAGNOSIS — C569 Malignant neoplasm of unspecified ovary: Secondary | ICD-10-CM

## 2016-08-14 DIAGNOSIS — C55 Malignant neoplasm of uterus, part unspecified: Secondary | ICD-10-CM

## 2016-08-14 NOTE — Progress Notes (Signed)
Dictation #1 [id number]  [id number]  Radiation Oncology         [phone] ________________________________  Name: Caitlyn [facility]gins MRN: [id number]  Date: 08/14/2016  DOB: 29-May-1965  Follow-Up Visit Note  Outpatient  CC: Shirline Frees, MD  Janie Morning, MD  Diagnosis and Prior Radiotherapy:    ICD-9-CM ICD-10-CM   1. Endometrial cancer ([place]) 182.0 C54.1    Stage II uterine carcinosarcoma (patient also had ovarian cancer, not treated with RT)   TREATMENT DATES:  12/2, 12/7, 12/11, 12/14 and 03/09/14                     SITE/DOSE: Vaginal Cuff 4 cm treatment length  / 30 Gy in 5 fractions                      Chief Complaint: Follow up of uterine cancer          Narrative:  C[facility]lyn [facility] is here for follow up of radiation completed 03/09/14 to her Vaginal Cuff.   On review of systems, the patient denies pain. She denies bowel or urinary concerns at this time. She reports she uses her vaginal dilator occasionally, though she is also sexually active.   She denies pain with intercourse despite some reported vaginal dryness. Overall the patient is without complaint.  No bleeding or vaginal discharge.  The patient saw Dr. Marko Plume and Dr. Skeet Latch late last year. She had a PAP smear on 02/20/16 that was negative. Her next appointment with Dr. Skeet Latch is in July.    ALLERGIES:  has No Known Allergies.  Meds: Current Outpatient Prescriptions  Medication Sig Dispense Refill  . allopurinol (ZYLOPRIM) 100 MG tablet Take 1 tablet (100 mg total) by mouth daily. 30 tablet 0  . cetirizine (ZYRTEC) 10 MG tablet Take 10 mg by mouth daily.    Marland Kitchen omeprazole (PRILOSEC) 40 MG capsule Take 1 capsule (40 mg total) by mouth daily. 90 capsule 2  . gabapentin (NEURONTIN) 100 MG capsule Take 1 capsule (100 mg total) by mouth at bedtime. For neuropathy. (Patient not taking: Reported on 03/09/2016) 30 capsule 0  . levothyroxine (SYNTHROID, LEVOTHROID) 50 MCG tablet TAKE 1 TABLET BY MOUTH  ONCE DAILY (Patient not taking: Reported on 08/14/2016) 30 tablet 2   No current facility-administered medications for this encounter.     Physical Findings: The patient is in no acute distress. Patient is alert and oriented.  height is 5\' 7"  (1.702 m) and weight is 292 lb 9.6 oz (132.7 kg). Her temperature is 98.4 F (36.9 C). Her blood pressure is 125/104 (abnormal). Her oxygen saturation is 98%.   General: Alert and oriented, in no acute distress.  Neck: no palpable adenopathy or masses Heart: Regular in rate and rhythm, no murmurs. Chest: Clear to auscultation bilaterally. Abdomen: Soft, non tender. Extremities: Some pitting edema in lower extremities bilaterally. Skin: No concerning lesions. Psychiatric: Judgment and insight are intact. Affect is appropriate. GYN: External genitalia appears normal. No lesions appreciated within the vaginal cuff upon digital or speculum exam.   Lab Findings: Lab Results  Component Value Date   WBC 6.1 03/09/2016   HGB 11.0 (L) 03/09/2016   HCT 32.7 (L) 03/09/2016   MCV 91.1 03/09/2016   PLT 203 03/09/2016    Radiographic Findings: No results found.  Impression/Plan: No evidence of recurrence on clinical exam today. PAP smear on 02/20/16 was negative.   Dr. Marko Plume has retired and the patient needs follow up in  medical oncology per Dr. Mariana Kaufman intention as documented in December 2017. I will schedule for her to see a provider in medical oncology in October 2018. Patient will follow up with Dr. Skeet Latch in July as indicated.  The patient will follow up with me in January 2019.   I spent 25 minutes face to face with the patient and more than 50% of that time was spent in counseling and/or coordination of care. _____________________________________   Eppie Gibson, MD  This document serves as a record of services personally performed by Eppie Gibson, MD. It was created on her behalf by Maryla Morrow, a trained medical scribe. The  creation of this record is based on the scribe's personal observations and the provider's statements to them. This document has been checked and approved by the attending provider.

## 2016-08-14 NOTE — Progress Notes (Signed)
Caitlyn [facility] presents for follow up of radiation completed 03/09/2014 to her vaginal cuff. She denies pain. She denies bowel or urinary problems. She uses the vaginal dilator occasionally. She is sexually active. She denies pain with intercourse but does report dryness. She saw Dr. Marko Plume and Dr. Skeet Latch late last year. She had a PAP smear 02/19/17. Her next appointment with Dr. Skeet Latch is in July.    BP (!) 125/104   Temp 98.4 F (36.9 C)   Ht 5\' 7"  (1.702 m)   Wt 292 lb 9.6 oz (132.7 kg)   LMP 03/09/2013   SpO2 98% Comment: room air  BMI 45.83 kg/m    Wt Readings from Last 3 Encounters:  08/14/16 292 lb 9.6 oz (132.7 kg)  03/09/16 291 lb 8 oz (132.2 kg)  09/27/15 283 lb 9.6 oz (128.6 kg)

## 2016-08-20 ENCOUNTER — Telehealth: Payer: Self-pay | Admitting: Hematology

## 2016-08-20 NOTE — Telephone Encounter (Signed)
Error

## 2016-08-24 ENCOUNTER — Telehealth: Payer: Self-pay | Admitting: Hematology and Oncology

## 2016-08-24 ENCOUNTER — Encounter: Payer: Self-pay | Admitting: Hematology and Oncology

## 2016-08-24 NOTE — Telephone Encounter (Signed)
Appt has been scheduled for the pt to see Dr. Alvy Bimler on 10/11 at 1030am. Letter mailed to the pt.

## 2016-08-27 ENCOUNTER — Other Ambulatory Visit: Payer: BLUE CROSS/BLUE SHIELD

## 2016-08-27 ENCOUNTER — Ambulatory Visit: Payer: BLUE CROSS/BLUE SHIELD | Admitting: Gynecologic Oncology

## 2016-08-31 ENCOUNTER — Telehealth: Payer: Self-pay | Admitting: *Deleted

## 2016-08-31 NOTE — Telephone Encounter (Signed)
Per patient request I have rescheduled her appt form July 5th to August 2rd. Patient aware of new date/time

## 2016-09-24 ENCOUNTER — Other Ambulatory Visit: Payer: BLUE CROSS/BLUE SHIELD

## 2016-09-24 ENCOUNTER — Ambulatory Visit: Payer: BLUE CROSS/BLUE SHIELD | Admitting: Gynecologic Oncology

## 2016-10-22 ENCOUNTER — Other Ambulatory Visit: Payer: BLUE CROSS/BLUE SHIELD

## 2016-10-22 ENCOUNTER — Ambulatory Visit: Payer: BLUE CROSS/BLUE SHIELD | Admitting: Gynecologic Oncology

## 2016-12-17 ENCOUNTER — Encounter: Payer: Self-pay | Admitting: Gynecologic Oncology

## 2016-12-17 ENCOUNTER — Ambulatory Visit: Payer: BLUE CROSS/BLUE SHIELD | Attending: Gynecologic Oncology | Admitting: Gynecologic Oncology

## 2016-12-17 VITALS — BP 169/65 | HR 53 | Temp 98.6°F | Resp 18 | Ht 67.0 in | Wt 289.1 lb

## 2016-12-17 DIAGNOSIS — T451X5A Adverse effect of antineoplastic and immunosuppressive drugs, initial encounter: Secondary | ICD-10-CM

## 2016-12-17 DIAGNOSIS — Z923 Personal history of irradiation: Secondary | ICD-10-CM | POA: Diagnosis not present

## 2016-12-17 DIAGNOSIS — C541 Malignant neoplasm of endometrium: Secondary | ICD-10-CM | POA: Diagnosis not present

## 2016-12-17 DIAGNOSIS — Z8543 Personal history of malignant neoplasm of ovary: Secondary | ICD-10-CM | POA: Diagnosis not present

## 2016-12-17 DIAGNOSIS — K629 Disease of anus and rectum, unspecified: Secondary | ICD-10-CM

## 2016-12-17 DIAGNOSIS — Z803 Family history of malignant neoplasm of breast: Secondary | ICD-10-CM | POA: Diagnosis not present

## 2016-12-17 DIAGNOSIS — Z9221 Personal history of antineoplastic chemotherapy: Secondary | ICD-10-CM | POA: Insufficient documentation

## 2016-12-17 DIAGNOSIS — Z8542 Personal history of malignant neoplasm of other parts of uterus: Secondary | ICD-10-CM

## 2016-12-17 DIAGNOSIS — E669 Obesity, unspecified: Secondary | ICD-10-CM

## 2016-12-17 DIAGNOSIS — Z9889 Other specified postprocedural states: Secondary | ICD-10-CM | POA: Insufficient documentation

## 2016-12-17 DIAGNOSIS — G620 Drug-induced polyneuropathy: Secondary | ICD-10-CM

## 2016-12-17 DIAGNOSIS — Z90710 Acquired absence of both cervix and uterus: Secondary | ICD-10-CM | POA: Insufficient documentation

## 2016-12-17 NOTE — Patient Instructions (Signed)
Plan to have a CT scan of the chest, abdomen, and pelvis.  We will call you with the results.  We will also place a referral for you to have a colonoscopy.  Follow up in December or sooner if needed.

## 2016-12-17 NOTE — Progress Notes (Signed)
Office Visit:  GYN ONCOLOGY  Chief Complaint:  Uterine carcinosarcoma, ovarian clear cell cancer surveillance  Assessment/Plan  Caitlyn Higgins is a 51 y.o. with  two primary malignancies.  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 Chemotherapy completed  01/04/2014.  Interval hysterectomy BSO omentectomy. Vaginal cuff bracytherapy completed 03/09/2014. Examination notable for a rubbery lesion in the RV septum. CT Chest Abd/pelvis.  Will contact with results Referral for screening colonoscopy  F/U 02/2017.  Earlier appointment based on imaging results.   Obesity: Discussed weight loss and exercise   History of Present Illness  Caitlyn Higgins is a 51 y.o. who presented with a  pelvic mass, to [facility] and was transferred to [facility] 02/23/2013.   The patient feels that she was in her usual state of health until a few days prior to presentation when she noted abdominal distention and bloating. She presented to her gastroenterologist and was sent for a CT scan.  The CT 02/22/2013 demonstrated " 1. 24.1 x 16.8 x 19.9 cm complex cystic mass which appears to arise from the pelvis extending into the mid upper abdomen is highly suspicious for an ovarian neoplasm. There is evidence of early omental disease, as well as a serosal implant upon the surface of the liver, with small volume of probable malignant ascites, and potential transdiaphragmatic extension based on small lymph nodes immediately above the diaphragm.  2. Importantly, this pelvic mass is causing severe compression of the proximal inferior vena cava, and there is a large volume of deep venous thrombosis in the visualized portion of the right thigh involving right superficial femoral, profunda femoral and common femoral veins. Additionally, there is a mass like peripheral wedge-shaped opacity in the right lower lobe. In the setting of deep venous  thrombosis, this wedge-shaped opacity would be most concerning for potential pulmonary infarction. Associated with this, there is a trace right pleural effusion. 3. 5.2 x 1.8 cm heterogeneously enhancing lesion  associated with the liver capsule overlying the right lobe of the  liver "  She was transferred to [facility].    IVC filter was placed and she was started on Lovenox.  She was subsequently transitioned to warfarin.   In December 2014 an endometrial biopsy was collected. Pathology :  1. 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  Caitlyn Higgins  received the 4th cycle of Taxol carboplatin therapy on 05/24/2013. She underwent interval debulking TAH BSO omentectomy 08/01/2013  Specimen(s): Left ovary and fallopian tube.  Left salpingo-oophorectomy Primary tumor site (including laterality): Left ovary Ovarian surface involvement: Present. Ovarian capsule intact without fragmentation: No. Maximum tumor size (cm): 20 cm, gross measurement. Histologic type: Mixed surface epithelial carcinoma with predominant clear cell carcinoma component (70%) and minor endometrioid component (30%) Please see comment for detail. Grade: High grade  TNM code: [id number], pNXMicroscopically, sectioned show an invasive high grade surface carcinoma arising in a background of endometriotic cyst. The tumor is predominately composed of clear cell carcinoma component with minor endometrioid carcinoma component. No tumor involvement is identified in the adherent omentum tissue. Immunohistochemical stains were performed and the clear cell carcinoma is patchy positive for p16, ER, p53, negative for WT-1 and p63. Focal angiolymphatic invasion is present.   The morphologic features are different from those seen in the uterus.  2. ONCOLOGY TABLE-UTERUS, CARCINOSARCOMA Specimen: Uterus,  cervix and  right fallopian tubes and ovaries. Procedure: Total hysterectomy and right salpingo-oophorectomy. Lymph node sampling performed: No. Specimen integrity: Intact. Maximum tumor size: Multiple microscopic foci, measuring up to 0.5 cm in greatest dimension. Histologic type: Carcinosarcoma (MMMT) with heterologous component (ossification) Grade: High grade Myometrial invasion: 2.1 cm where myometrium is 2.5 cm in thickness Cervical stromal involvement: No. Extent of involvement of other organs: No. Lymph - vascular invasion: Not identified.  FIGO Stage (based on pathologic findings, needs clinical correlation): IB Comment: Immunohistochemical stains were performed and the residual carcinoma is positive for p16, negative for WT-1, weakly positive for p53 and ER. The morphologic features are different from those seen in the left ovary. (HCL:gt, 08/04/13)   She has had total of 9 cycles of carboplatin and taxol thru 01-04-14.    CT 01/22/2014 IMPRESSION: 1. Postoperative changes of hysterectomy and left salpingo-oophorectomy without evidence of recurrent or metastatic disease. 2. Scattered lymph nodes and small omental nodules are unchanged  Received vaginal brachytherapy TREATMENT DATES: 12/2, 12/7, 12/11, 12/14 and 03/09/14  SITE/DOSE: Vaginal Cuff 4 cm treatment length / 30 Gy in 5 fractions  Technique: HDR with Ir-192  Genetic testing negative  Interval History. Doing well, weight gain.   Lab Results  Component Value Date   CA125 5 09/23/2015     Past Medical History:  Diagnosis Date  . Anemia   . Cancer (HCC)    uterine carcinosarcoma  . DVT (deep venous thrombosis) (HCC)    right leg 12'14  . Elevated blood uric acid level    tx. Allopurinol  . Family history of malignant neoplasm of breast   . GERD (gastroesophageal reflux disease)   . Hx of radiation therapy HDR   x 5 tx  completed 03/09/14   . Hypertension   . Ovarian cancer ([place]) 2015  . Pulmonary emboli ([place])    12'14 denies any sob  . Reflux   . Transfusion history 07-26-13   12'14, 04-14-13(2 units), 06-09-13   Past Surgical History:  Procedure Laterality Date  . ABDOMINAL HYSTERECTOMY N/A 08/01/2013   Procedure: EXPLORATORY LAPAROTOMY/HYSTERECTOMY TOTAL ABDOMINAL;  Surgeon: Janie Morning, MD;  Location: [facility];  Service: Gynecology;  Laterality: N/A;  . IVC Right    filter placed to prevent Pulmonary emboli  . PORTACATH PLACEMENT Right    rigth chest- remains  . SALPINGOOPHORECTOMY Bilateral 08/01/2013   Procedure: BILATERAL SALPINGO OOPHORECTOMY/OMENTECTOMY ;  Surgeon: Janie Morning, MD;  Location: [facility];  Service: Gynecology;  Laterality: Bilateral;  Mammogram earlier this year within normal limits   GYN History G0 Hx of Abnormal Pap Smears: no, last pap ~2010  Hx of STIs: no  Never had regular periods.   Social Hx:   As taken the responsibility of providing care for a foster son and daughter. This provides a significant amount of emotional fulfillment  Family History  several second degree relatives with breast cancer.   Review of Systems  Reports significant attempted weight control.  Denies cough, no hemoptysis, reports residual foot neuropathy she reports constipation. No vaginal bleeding, no rectal bleeding. No dysphagia, no vaginal bleeding, emotionally and a very good place otherwise 10 point  ROS negative  Physical Exam    Wt Readings from Last 3 Encounters:  08/14/16 292 lb 9.6 oz (132.7 kg)  03/09/16 291 lb 8 oz (132.2 kg)  09/27/15 283 lb 9.6 oz (128.6 kg)   General: No acute distress.  HEENT: Extra occular muscles grossly intact.  Pulmonary: CTAB.  Cardiovascular: RRR. Early systolic ejection  murmur Abdomen: Soft NT.  Midline incision well healed.  No masses or hernia.  No rebound/guarding. Musculoskeletal: Grossly normal ROM.  Extremities: Warm and well-perfused. 1+ pitting edema   Neurological: Alert and oriented x3.  Psychological: Appropriate mood/affect.  Genitourinary:  Nl EGBUS, Vagina without bleeding or discharge.  Vagina atrophic Cuff intact.  No cul de sac masses.  Rectal"  Good tone , 2cm rubbery lesion in the RV septum LN:  No cervical supraclavicular or inguinal adenopathy.

## 2016-12-18 ENCOUNTER — Encounter: Payer: Self-pay | Admitting: Gynecologic Oncology

## 2016-12-18 NOTE — Progress Notes (Signed)
CT CAP approved with Auth # of [id number] valid today through Oct 27

## 2016-12-23 ENCOUNTER — Other Ambulatory Visit: Payer: Self-pay | Admitting: Gynecologic Oncology

## 2016-12-23 ENCOUNTER — Encounter (HOSPITAL_COMMUNITY): Payer: Self-pay

## 2016-12-23 ENCOUNTER — Ambulatory Visit (HOSPITAL_COMMUNITY)
Admission: RE | Admit: 2016-12-23 | Discharge: 2016-12-23 | Disposition: A | Discharge status: Home / Self Care | Payer: BLUE CROSS/BLUE SHIELD | Source: Ambulatory Visit | Attending: Gynecologic Oncology | Admitting: Gynecologic Oncology

## 2016-12-23 DIAGNOSIS — E041 Nontoxic single thyroid nodule: Secondary | ICD-10-CM | POA: Diagnosis not present

## 2016-12-23 DIAGNOSIS — R590 Localized enlarged lymph nodes: Secondary | ICD-10-CM | POA: Diagnosis not present

## 2016-12-23 DIAGNOSIS — C541 Malignant neoplasm of endometrium: Secondary | ICD-10-CM | POA: Diagnosis not present

## 2016-12-23 DIAGNOSIS — R1900 Intra-abdominal and pelvic swelling, mass and lump, unspecified site: Secondary | ICD-10-CM

## 2016-12-23 MED ORDER — IOPAMIDOL (ISOVUE-300) INJECTION 61%
100.0000 mL | Freq: Once | INTRAVENOUS | Status: AC | PRN
  Administered 2016-12-23: 100 mL via INTRAVENOUS

## 2016-12-23 MED ORDER — IOPAMIDOL (ISOVUE-300) INJECTION 61%
INTRAVENOUS | Status: AC
  Filled 2016-12-23: qty 100

## 2016-12-24 ENCOUNTER — Telehealth: Payer: Self-pay | Admitting: *Deleted

## 2016-12-24 NOTE — Telephone Encounter (Signed)
Patient has an appt on October 11th at 3:45pm with Dr. Michail Sermon at [facility]

## 2016-12-25 ENCOUNTER — Telehealth: Payer: Self-pay | Admitting: *Deleted

## 2016-12-25 ENCOUNTER — Other Ambulatory Visit: Payer: Self-pay | Admitting: Gynecologic Oncology

## 2016-12-25 ENCOUNTER — Telehealth: Payer: Self-pay | Admitting: Gynecologic Oncology

## 2016-12-25 DIAGNOSIS — Z1231 Encounter for screening mammogram for malignant neoplasm of breast: Secondary | ICD-10-CM

## 2016-12-25 NOTE — Telephone Encounter (Signed)
Left message asking patient to please call the office to discuss the plan moving forward.

## 2016-12-25 NOTE — Telephone Encounter (Signed)
Patient advised that the plan would be for a possible biopsy with the GI physician since it was felt there was not a safe route due to the proximity to the rectum for biopsy with IR.

## 2016-12-25 NOTE — Telephone Encounter (Signed)
Contacted the patient and gave the appt for October 16th at 10am, arrive at 9:30am. Gave the instruction of NPO after midinght.

## 2016-12-28 ENCOUNTER — Telehealth: Payer: Self-pay | Admitting: *Deleted

## 2016-12-28 NOTE — Telephone Encounter (Signed)
Contacted the patient and canceled appt for October 11th at 10:30am per Dr. Alvy Bimler.

## 2016-12-31 ENCOUNTER — Ambulatory Visit: Payer: BLUE CROSS/BLUE SHIELD | Admitting: Hematology and Oncology

## 2017-01-04 ENCOUNTER — Telehealth: Payer: Self-pay | Admitting: *Deleted

## 2017-01-04 NOTE — Telephone Encounter (Signed)
Patient called and left a message regarding her PET scan, and wanted instructions. Attempted to contact the patient, LMOM with the following instructions. (NPO after midnight to arrive at 9:30am for a 10am appt, and that the imaging department will give her something to drink when she arrives)

## 2017-01-05 ENCOUNTER — Encounter (HOSPITAL_COMMUNITY)
Admission: RE | Admit: 2017-01-05 | Discharge: 2017-01-05 | Disposition: A | Discharge status: Home / Self Care | Payer: BLUE CROSS/BLUE SHIELD | Source: Ambulatory Visit | Attending: Gynecologic Oncology | Admitting: Gynecologic Oncology

## 2017-01-05 DIAGNOSIS — R1900 Intra-abdominal and pelvic swelling, mass and lump, unspecified site: Secondary | ICD-10-CM | POA: Diagnosis present

## 2017-01-05 LAB — GLUCOSE, CAPILLARY: GLUCOSE-CAPILLARY: 106 mg/dL — AB (ref 65–99)

## 2017-01-05 MED ORDER — FLUDEOXYGLUCOSE F - 18 (FDG) INJECTION
14.3000 | Freq: Once | INTRAVENOUS | Status: AC | PRN
  Administered 2017-01-05: 14.3 via INTRAVENOUS

## 2017-01-07 ENCOUNTER — Telehealth: Payer: Self-pay | Admitting: *Deleted

## 2017-01-07 NOTE — Telephone Encounter (Signed)
Called and spoke with the patient regarding her bowel prep for her colonoscopy on October 25th. Patient has an appt with Dr. Skeet Latch that day also. Patient will call the office once she receives her instructions for the prep.

## 2017-01-12 ENCOUNTER — Ambulatory Visit
Admission: RE | Admit: 2017-01-12 | Discharge: 2017-01-12 | Disposition: A | Discharge status: Home / Self Care | Payer: BLUE CROSS/BLUE SHIELD | Source: Ambulatory Visit | Attending: Gynecologic Oncology | Admitting: Gynecologic Oncology

## 2017-01-12 DIAGNOSIS — Z1231 Encounter for screening mammogram for malignant neoplasm of breast: Secondary | ICD-10-CM

## 2017-01-14 ENCOUNTER — Ambulatory Visit: Payer: BLUE CROSS/BLUE SHIELD | Attending: Gynecologic Oncology | Admitting: Gynecologic Oncology

## 2017-01-14 ENCOUNTER — Encounter: Payer: Self-pay | Admitting: Gynecologic Oncology

## 2017-01-14 VITALS — BP 138/100 | HR 77 | Temp 98.1°F | Resp 20 | Ht 67.0 in | Wt 289.1 lb

## 2017-01-14 DIAGNOSIS — Z803 Family history of malignant neoplasm of breast: Secondary | ICD-10-CM | POA: Insufficient documentation

## 2017-01-14 DIAGNOSIS — Z923 Personal history of irradiation: Secondary | ICD-10-CM | POA: Diagnosis not present

## 2017-01-14 DIAGNOSIS — C562 Malignant neoplasm of left ovary: Secondary | ICD-10-CM | POA: Insufficient documentation

## 2017-01-14 DIAGNOSIS — Z86711 Personal history of pulmonary embolism: Secondary | ICD-10-CM | POA: Diagnosis not present

## 2017-01-14 DIAGNOSIS — Z7901 Long term (current) use of anticoagulants: Secondary | ICD-10-CM | POA: Insufficient documentation

## 2017-01-14 DIAGNOSIS — Z9221 Personal history of antineoplastic chemotherapy: Secondary | ICD-10-CM | POA: Insufficient documentation

## 2017-01-14 DIAGNOSIS — Z8543 Personal history of malignant neoplasm of ovary: Secondary | ICD-10-CM | POA: Diagnosis not present

## 2017-01-14 DIAGNOSIS — C55 Malignant neoplasm of uterus, part unspecified: Secondary | ICD-10-CM | POA: Insufficient documentation

## 2017-01-14 DIAGNOSIS — C541 Malignant neoplasm of endometrium: Secondary | ICD-10-CM | POA: Diagnosis not present

## 2017-01-14 DIAGNOSIS — Z90722 Acquired absence of ovaries, bilateral: Secondary | ICD-10-CM

## 2017-01-14 DIAGNOSIS — Z90710 Acquired absence of both cervix and uterus: Secondary | ICD-10-CM

## 2017-01-14 DIAGNOSIS — K219 Gastro-esophageal reflux disease without esophagitis: Secondary | ICD-10-CM | POA: Diagnosis not present

## 2017-01-14 DIAGNOSIS — I10 Essential (primary) hypertension: Secondary | ICD-10-CM | POA: Diagnosis not present

## 2017-01-14 DIAGNOSIS — Z9889 Other specified postprocedural states: Secondary | ICD-10-CM | POA: Diagnosis not present

## 2017-01-14 NOTE — Patient Instructions (Signed)
Plan for surgery at [facility] on November 7 with Dr. Skeet Latch.  Your pre-op appointment at [facility] will be on January 21, 2017 at 10 am at the [facility] on the [place].  You will be scheduled for an exploratory laparotomy, resection of a pelvic mass, possibly done robotically.

## 2017-01-14 NOTE — Progress Notes (Signed)
Office Visit:  GYN ONCOLOGY  Chief Complaint:  Uterine carcinosarcoma, ovarian clear cell cancer pelvic recurrence  Assessment/Plan  Caitlyn Higgins is a 51 y.o. with  two primary malignancies.  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 Chemotherapy completed  01/04/2014.  Interval hysterectomy BSO omentectomy. Vaginal cuff bracytherapy completed 03/09/2014. Examination notable for a rubbery lesion in the RV septum almost 3 years since completion of treatment for her synchronous primaries.  Subsequent CT of the chest abdomen and pelvis confirmed the presence of a 2.7 cm soft tissue mass between the vaginal cuff and anterior rectal wall.  CT imaging confirmed the findings and was notable for the absence of any metastatic disease. At this visit the radiologic findings were discussed and the time was spent discussing the clinical options Options presented 1.  Resection of mass with adjuvant treatment which may be chemotherapy if this is recurrent clear cell or chemotherapy and radiation if the recurrence is identified as a uterine carcinosarcoma. 2.  6 cycles of chemotherapy with Taxol carboplatin 3.  Consideration of chemotherapy with paclitaxel and carboplatin and adjuvant radiotherapy.  [name] opts for resection of the mass.  She is aware that if this procedure is not feasible in a minimally invasive fashion laparotomy may be required.  She is also aware that the procedure may require resection of the bowel and consideration of a colostomy.  This procedure will be performed at [facility].  A bowel prep will be obtained prior to the procedure and a request will be made for marking for a descending colostomy.  CHIEF COMPLAINT  History of Present Illness   Oncology History    Caitlyn Higgins is a 51 y.o. who presented with a  pelvic mass, to [facility] and was transferred to [facility] 02/23/2013.   The patient feels  that she was in her usual state of health until a few days prior to presentation when she noted abdominal distention and bloating. She presented to her gastroenterologist and was sent for a CT scan.  The CT 02/22/2013 demonstrated " 1. 24.1 x 16.8 x 19.9 cm complex cystic mass which appears to arise from the pelvis extending into the mid upper abdomen is highly suspicious for an ovarian neoplasm. There is evidence of early omental disease, as well as a serosal implant upon the surface of the liver, with small volume of probable malignant ascites, and potential transdiaphragmatic extension based on small lymph nodes immediately above the diaphragm.  2. Importantly, this pelvic mass is causing severe compression of the proximal inferior vena cava, and there is a large volume of deep venous thrombosis in the visualized portion of the right thigh involving right superficial femoral, profunda femoral and common femoral veins. Additionally, there is a mass like peripheral wedge-shaped opacity in the right lower lobe. In the setting of deep venous thrombosis, this wedge-shaped opacity would be most concerning for potential pulmonary infarction. Associated with this, there is a trace right pleural effusion. 3. 5.2 x 1.8 cm heterogeneously enhancing lesion  associated with the liver capsule overlying the right lobe of the  liver "  She was transferred to [facility].        Endometrial cancer ([place])   12/2012 Miscellaneous    Pulmonary embolus  IVC filter was placed and she was started on Lovenox.  She was subsequently transitioned to warfarin.       03/10/2013 Initial Diagnosis    Endometrial cancer  03/14/2013 - 01/04/2014 Chemotherapy    taxol carboplatin x 9      08/01/2013 Surgery    TAH BSO omentectomy Left ovary 20cm clear cell malignancy  Uterus Carcinosarcoma  Multiple microscopic foci, measuring up to 0.5 cm in greatest dimension. Histologic type: Carcinosarcoma (MMMT) with heterologous  component (ossification) Grade: High grade Myometrial invasion: 2.1 cm where myometrium is 2.5 cm in thickness Cervical stromal involvement: No.      02/21/2014 - 03/09/2014 Radiation Therapy    Received vaginal brachytherapy TREATMENT DATES: 12/2, 12/7, 12/11, 12/14 and 03/09/14  SITE/DOSE: Vaginal Cuff 4 cm treatment length / 30 Gy in 5 fractions  Technique: HDR with Ir-192       12/17/2016 Relapse/Recurrence    2cm rubbery like mass in the RV septum on physical examination      12/23/2016 Imaging    Reproductive: Previous hysterectomy. New soft tissue mass seen between the vaginal cuff and anterior rectal wall which measures 2.7 x 2.6 cm on image 112/ 2. This is consistent with locally recurrent endometrial carcinoma.      01/05/2017 Imaging    1. Previously noted pelvic mass is similar in size and appearance to the prior examination from 12/23/2016 demonstrating some low-level hypermetabolism. This is nonspecific, and could represent a lesion of rectal, cervical or uterine origin. 2. No definite findings to suggest metastatic disease in the chest, abdomen or pelvis. 3. Small focus of hypermetabolism in the distal third of the esophagus without a perceivable abnormality on the CT portion of the examination. This could represent physiologic activity, a focal area of esophagitis, or an underlying lesion. Clinical correlation is recommended, with consideration for followup upper endoscopy if clinically appropriate. 4. Nonspecific low-level hypermetabolism in a left axillary lymph node which is stable in size and appearance compared to prior examinations. This is favored to be benign, but correlation with routine annual mammography is recommended. 5. Dilatation of the pulmonic trunk (4 cm in diameter), concerning for pulmonary arterial hypertension. 6. Severe hepatic steatosis.       Ovarian cancer ([place])    08/01/2013 Initial Diagnosis    Ovarian cancer ([place]) Unstaged.  Incidental pathology finding      2016 Genetic Testing     Results revealed patient has the following mutation(s): NEGATIVE      \   Past Medical History:  Diagnosis Date  . Anemia   . Cancer (HCC)    uterine carcinosarcoma  . DVT (deep venous thrombosis) (HCC)    right leg 12'14  . Elevated blood uric acid level    tx. Allopurinol  . Family history of malignant neoplasm of breast   . GERD (gastroesophageal reflux disease)   . Hx of radiation therapy HDR   x 5 tx  completed 03/09/14  . Hypertension   . Ovarian cancer ([place]) 2015  . Pulmonary emboli ([place])    12'14 denies any sob  . Reflux   . Transfusion history 07-26-13   12'14, 04-14-13(2 units), 06-09-13   Past Surgical History:  Procedure Laterality Date  . ABDOMINAL HYSTERECTOMY N/A 08/01/2013   Procedure: EXPLORATORY LAPAROTOMY/HYSTERECTOMY TOTAL ABDOMINAL;  Surgeon: Janie Morning, MD;  Location: [facility];  Service: Gynecology;  Laterality: N/A;  . IVC Right    filter placed to prevent Pulmonary emboli  . PORTACATH PLACEMENT Right    rigth chest- remains  . SALPINGOOPHORECTOMY Bilateral 08/01/2013   Procedure: BILATERAL SALPINGO OOPHORECTOMY/OMENTECTOMY ;  Surgeon: Janie Morning, MD;  Location: [facility];  Service: Gynecology;  Laterality: Bilateral;  Mammogram earlier this year within normal limits   GYN History G0 Hx of Abnormal Pap Smears: no, last pap ~2010  Hx of STIs: no  Never had regular periods.   Social Hx:   As taken the responsibility of providing care for a foster son and daughter. This provides a significant amount of emotional fulfillment  Family History  several second degree relatives with breast cancer.   Review of Systems  Reports significant attempted weight control.  Denies cough, no hemoptysis, reports residual foot neuropathy she reports constipation. No vaginal bleeding, no rectal bleeding. No dysphagia, no vaginal bleeding,  emotionally and a very good place otherwise 10 point  ROS negative  Physical Exam    Wt Readings from Last 3 Encounters:  01/14/17 289 lb 1.6 oz (131.1 kg)  12/17/16 289 lb 1.6 oz (131.1 kg)  08/14/16 292 lb 9.6 oz (132.7 kg)  BP (!) 138/100 (BP Location: Left Arm, Patient Position: Sitting) Comment: RN notified/ taken manual   Pulse 77   Temp 98.1 F (36.7 C) (Oral)   Resp 20   Ht 5\' 7"  (1.702 m)   Wt 289 lb 1.6 oz (131.1 kg)   LMP 03/09/2013   BMI 45.28 kg/m    Examination September 2018 General: No acute distress.  HEENT: Extra occular muscles grossly intact.  Pulmonary: CTAB.  Cardiovascular: RRR. Early systolic ejection murmur Abdomen: Soft NT.  Midline incision well healed.  No masses or hernia.  No rebound/guarding. Musculoskeletal: Grossly normal ROM.  Extremities: Warm and well-perfused. 1+ pitting edema  Neurological: Alert and oriented x3.  Psychological: Appropriate mood/affect.  Genitourinary:  Nl EGBUS, Vagina without bleeding or discharge.  Vagina atrophic Cuff intact.  No cul de sac masses.  Rectal"  Good tone , 2cm rubbery lesion in the RV septum LN:  No cervical supraclavicular or inguinal adenopathy.

## 2017-01-15 ENCOUNTER — Telehealth: Payer: Self-pay | Admitting: *Deleted

## 2017-01-15 NOTE — Telephone Encounter (Signed)
Faxed patient's office note from Dr. Skeet Latch, cytology report and PET scan to [facility] at [facility]

## 2017-01-26 ENCOUNTER — Telehealth: Payer: Self-pay | Admitting: *Deleted

## 2017-01-26 NOTE — Telephone Encounter (Signed)
Patient called regarding her surgery for tomorrow at [facility]. Contacted Dr. Skeet Latch and she will have someone call the patient. Advised the patient Dr. Skeet Latch will have someone call her.

## 2017-01-28 ENCOUNTER — Other Ambulatory Visit: Payer: Self-pay | Admitting: Gynecologic Oncology

## 2017-01-28 DIAGNOSIS — C541 Malignant neoplasm of endometrium: Secondary | ICD-10-CM

## 2017-02-05 ENCOUNTER — Telehealth: Payer: Self-pay | Admitting: *Deleted

## 2017-02-05 NOTE — Telephone Encounter (Signed)
Called and gave the patient the new patient appt for Dr.Gorsuch on November 29th at 11:15am

## 2017-02-12 ENCOUNTER — Other Ambulatory Visit: Payer: Self-pay | Admitting: Radiology

## 2017-02-16 ENCOUNTER — Ambulatory Visit (HOSPITAL_COMMUNITY)
Admission: RE | Admit: 2017-02-16 | Discharge: 2017-02-16 | Disposition: A | Discharge status: Home / Self Care | Payer: BLUE CROSS/BLUE SHIELD | Source: Ambulatory Visit | Attending: Gynecologic Oncology | Admitting: Gynecologic Oncology

## 2017-02-16 ENCOUNTER — Other Ambulatory Visit: Payer: Self-pay | Admitting: Radiology

## 2017-02-16 ENCOUNTER — Encounter (HOSPITAL_COMMUNITY): Payer: Self-pay

## 2017-02-16 ENCOUNTER — Other Ambulatory Visit: Payer: Self-pay | Admitting: Gynecologic Oncology

## 2017-02-16 DIAGNOSIS — K219 Gastro-esophageal reflux disease without esophagitis: Secondary | ICD-10-CM | POA: Diagnosis not present

## 2017-02-16 DIAGNOSIS — C55 Malignant neoplasm of uterus, part unspecified: Secondary | ICD-10-CM | POA: Diagnosis not present

## 2017-02-16 DIAGNOSIS — Z86711 Personal history of pulmonary embolism: Secondary | ICD-10-CM | POA: Insufficient documentation

## 2017-02-16 DIAGNOSIS — C541 Malignant neoplasm of endometrium: Secondary | ICD-10-CM

## 2017-02-16 DIAGNOSIS — Z86718 Personal history of other venous thrombosis and embolism: Secondary | ICD-10-CM | POA: Diagnosis not present

## 2017-02-16 DIAGNOSIS — I10 Essential (primary) hypertension: Secondary | ICD-10-CM | POA: Insufficient documentation

## 2017-02-16 DIAGNOSIS — C562 Malignant neoplasm of left ovary: Secondary | ICD-10-CM | POA: Insufficient documentation

## 2017-02-16 HISTORY — PX: IR US GUIDE VASC ACCESS RIGHT: IMG2390

## 2017-02-16 HISTORY — PX: IR FLUORO GUIDE PORT INSERTION RIGHT: IMG5741

## 2017-02-16 HISTORY — DX: Personal history of antineoplastic chemotherapy: Z92.21

## 2017-02-16 LAB — CBC WITH DIFFERENTIAL/PLATELET
BASOS ABS: 0 10*3/uL (ref 0.0–0.1)
BASOS PCT: 0 %
EOS ABS: 0 10*3/uL (ref 0.0–0.7)
EOS PCT: 1 %
HCT: 34.4 % — ABNORMAL LOW (ref 36.0–46.0)
Hemoglobin: 11.4 g/dL — ABNORMAL LOW (ref 12.0–15.0)
Lymphocytes Relative: 19 %
Lymphs Abs: 1.4 10*3/uL (ref 0.7–4.0)
MCH: 30.8 pg (ref 26.0–34.0)
MCHC: 33.1 g/dL (ref 30.0–36.0)
MCV: 93 fL (ref 78.0–100.0)
MONO ABS: 0.3 10*3/uL (ref 0.1–1.0)
Monocytes Relative: 5 %
Neutro Abs: 5.3 10*3/uL (ref 1.7–7.7)
Neutrophils Relative %: 75 %
PLATELETS: 237 10*3/uL (ref 150–400)
RBC: 3.7 MIL/uL — ABNORMAL LOW (ref 3.87–5.11)
RDW: 14.4 % (ref 11.5–15.5)
WBC: 7.1 10*3/uL (ref 4.0–10.5)

## 2017-02-16 LAB — BASIC METABOLIC PANEL
Anion gap: 9 (ref 5–15)
BUN: 14 mg/dL (ref 6–20)
CALCIUM: 9.3 mg/dL (ref 8.9–10.3)
CHLORIDE: 102 mmol/L (ref 101–111)
CO2: 26 mmol/L (ref 22–32)
Creatinine, Ser: 1.15 mg/dL — ABNORMAL HIGH (ref 0.44–1.00)
GFR, EST NON AFRICAN AMERICAN: 53 mL/min — AB (ref >60)
Glucose, Bld: 95 mg/dL (ref 65–99)
POTASSIUM: 4.6 mmol/L (ref 3.5–5.1)
SODIUM: 137 mmol/L (ref 135–145)

## 2017-02-16 LAB — PROTIME-INR
INR: 1.01
Prothrombin Time: 13.2 seconds (ref 11.4–15.2)

## 2017-02-16 MED ORDER — LIDOCAINE-EPINEPHRINE (PF) 2 %-1:200000 IJ SOLN
INTRAMUSCULAR | Status: AC | PRN
  Administered 2017-02-16: 20 mL

## 2017-02-16 MED ORDER — HYDRALAZINE HCL 20 MG/ML IJ SOLN
INTRAMUSCULAR | Status: AC
  Filled 2017-02-16: qty 1

## 2017-02-16 MED ORDER — MIDAZOLAM HCL 2 MG/2ML IJ SOLN
INTRAMUSCULAR | Status: AC | PRN
  Administered 2017-02-16 (×2): 1 mg via INTRAVENOUS

## 2017-02-16 MED ORDER — HYDRALAZINE HCL 20 MG/ML IJ SOLN: 10.0000 mg | Freq: Once | INTRAMUSCULAR | Status: DC

## 2017-02-16 MED ORDER — CEFAZOLIN SODIUM-DEXTROSE 2-4 GM/100ML-% IV SOLN
INTRAVENOUS | Status: AC
  Administered 2017-02-16: 2 g via INTRAVENOUS
  Filled 2017-02-16: qty 100

## 2017-02-16 MED ORDER — MIDAZOLAM HCL 2 MG/2ML IJ SOLN
INTRAMUSCULAR | Status: AC
  Filled 2017-02-16: qty 2

## 2017-02-16 MED ORDER — FENTANYL CITRATE (PF) 100 MCG/2ML IJ SOLN
INTRAMUSCULAR | Status: AC
  Filled 2017-02-16: qty 2

## 2017-02-16 MED ORDER — CEFAZOLIN SODIUM-DEXTROSE 2-4 GM/100ML-% IV SOLN
2.0000 g | INTRAVENOUS | Status: AC
  Administered 2017-02-16: 2 g via INTRAVENOUS

## 2017-02-16 MED ORDER — HEPARIN SOD (PORK) LOCK FLUSH 100 UNIT/ML IV SOLN
INTRAVENOUS | Status: AC
  Filled 2017-02-16: qty 5

## 2017-02-16 MED ORDER — FENTANYL CITRATE (PF) 100 MCG/2ML IJ SOLN
INTRAMUSCULAR | Status: AC | PRN
  Administered 2017-02-16 (×2): 50 ug via INTRAVENOUS

## 2017-02-16 MED ORDER — HEPARIN SOD (PORK) LOCK FLUSH 100 UNIT/ML IV SOLN
INTRAVENOUS | Status: AC | PRN
  Administered 2017-02-16: 500 [IU] via INTRAVENOUS

## 2017-02-16 MED ORDER — SODIUM CHLORIDE 0.9 % IV SOLN
INTRAVENOUS | Status: DC
  Administered 2017-02-16: 12:00:00 via INTRAVENOUS

## 2017-02-16 MED ORDER — LIDOCAINE-EPINEPHRINE (PF) 2 %-1:200000 IJ SOLN
INTRAMUSCULAR | Status: AC
  Filled 2017-02-16: qty 20

## 2017-02-16 MED ORDER — LIDOCAINE-EPINEPHRINE (PF) 1 %-1:200000 IJ SOLN
INTRAMUSCULAR | Status: AC | PRN
  Administered 2017-02-16: 20 mL

## 2017-02-16 MED ORDER — IOPAMIDOL (ISOVUE-300) INJECTION 61%
INTRAVENOUS | Status: AC
  Filled 2017-02-16: qty 50

## 2017-02-16 NOTE — Progress Notes (Signed)
Pt states has surgery approx 2 weeks ago has small open wound to  Right upper abd quad minimal open no drainage did cover with 2x2 securing with tape for impending procedure today

## 2017-02-16 NOTE — H&P (Signed)
Referring Physician(s): Cross,Melissa D/Brewster,W  Supervising Physician: Sandi Mariscal  Patient Status:  [facility]  Chief Complaint:  "I'm here to get another port placed"  Subjective: Patient familiar to IR service from prior right chest wall Port-A-Cath in January 2015 along with left thyroid biopsy in 2015.  Port-A-Cath was subsequently removed on 03/12/14.  She has a history of both uterine carcinosarcoma and left ovarian clear cell carcinoma, status post surgery and chemoradiation.  She now has evidence of recurrent disease and presents again today for a Port-A-Cath placement for chemotherapy.  She currently denies fever, headache, chest pain, dyspnea, cough, abdominal pain, nausea, vomiting or bleeding.  She does have intermittent back pain. Past Medical History:  Diagnosis Date  . Anemia   . Cancer (HCC)    uterine carcinosarcoma  . DVT (deep venous thrombosis) (HCC)    right leg 12'14  . Elevated blood uric acid level    tx. Allopurinol  . Family history of malignant neoplasm of breast   . GERD (gastroesophageal reflux disease)   . History of chemotherapy   . Hx of radiation therapy HDR   x 5 tx  completed 03/09/14  . Hypertension   . Ovarian cancer ([place]) 2015  . Pulmonary emboli ([place])    12'14 denies any sob  . Reflux   . Transfusion history 07-26-13   12'14, 04-14-13(2 units), 06-09-13   Past Surgical History:  Procedure Laterality Date  . ABDOMINAL HYSTERECTOMY N/A 08/01/2013   Procedure: EXPLORATORY LAPAROTOMY/HYSTERECTOMY TOTAL ABDOMINAL;  Surgeon: Janie Morning, MD;  Location: [facility];  Service: Gynecology;  Laterality: N/A;  . IVC Right    filter placed to prevent Pulmonary emboli  . PORTACATH PLACEMENT Right    rigth chest- remains  . removed tumor from rectum    . SALPINGOOPHORECTOMY Bilateral 08/01/2013   Procedure: BILATERAL SALPINGO OOPHORECTOMY/OMENTECTOMY ;  Surgeon: Janie Morning, MD;  Location: [facility];  Service: Gynecology;  Laterality: Bilateral;      Allergies: Patient has no known allergies.  Medications: Prior to Admission medications   Medication Sig Start Date End Date Taking? Authorizing Provider  acetaminophen (TYLENOL) 325 MG tablet Take 650 mg by mouth every 6 (six) hours as needed.   Yes [provider]  cetirizine (ZYRTEC) 10 MG tablet Take 10 mg by mouth daily.   Yes [provider]  allopurinol (ZYLOPRIM) 100 MG tablet Take 1 tablet (100 mg total) by mouth daily. 12/14/13   Livesay, Tamala Julian, MD  gabapentin (NEURONTIN) 100 MG capsule Take 1 capsule (100 mg total) by mouth at bedtime. For neuropathy. Patient not taking: Reported on 03/09/2016 09/23/15   Gordy Levan, MD  GAVILYTE-N WITH FLAVOR PACK 420 g solution See admin instructions. 01/12/17   [provider]  levothyroxine (SYNTHROID, LEVOTHROID) 50 MCG tablet TAKE 1 TABLET BY MOUTH ONCE DAILY Patient not taking: Reported on 08/14/2016 03/26/14   Elayne Snare, MD  omeprazole (PRILOSEC) 40 MG capsule Take 1 capsule (40 mg total) by mouth daily. 01/10/14   Gordy Levan, MD     Vital Signs: BP (!) 175/98 (BP Location: Right Arm)   Pulse 76   Temp 98.2 F (36.8 C) (Oral)   Resp 16   LMP 03/09/2013   SpO2 98%   Physical Exam awake, alert.  Chest clear to auscultation bilaterally.  Heart with regular rate and rhythm, ?soft murmur.  Abdomen obese, soft, positive bowel sounds, nontender; laparoscopic wound noted left lower abdomen with slightly opened central portion, no active  drainage.    Imaging: No results found.  Labs:  CBC: Recent Labs    03/09/16 0924  WBC 6.1  HGB 11.0*  HCT 32.7*  PLT 203    COAGS: No results for input(s): INR, APTT in the last 8760 hours.  BMP: Recent Labs    03/09/16 0924  NA 142  K 3.7  CO2 26  GLUCOSE 98  BUN 16.8  CALCIUM 9.2  CREATININE 1.1    LIVER FUNCTION TESTS: Recent Labs    03/09/16 0924  BILITOT 0.60  AST 25  ALT 20  ALKPHOS 111  PROT 8.6*  ALBUMIN 3.3*     Assessment and Plan: Pt with history of both uterine carcinosarcoma and left ovarian clear cell carcinoma 2015, status post surgery and chemoradiation.  She is status post prior right chest wall Port-A-Cath on 04/03/13, removed on 03/12/14.  She now has evidence of recurrent disease and presents again today for new Port-A-Cath placement for chemotherapy.  Risks and benefits discussed with the patient/sister including, but not limited to bleeding, infection, pneumothorax, or fibrin sheath development and need for additional procedures.All of the patient's questions were answered, patient is agreeable to proceed.Consent signed and in chart.Labs pend.     Electronically Signed: D. Rowe Robert, PA-C 02/16/2017, 11:41 AM   I spent a total of 20 minutes at the the patient's bedside AND on the patient's hospital floor or unit, greater than 50% of which was counseling/coordinating care for port cath placement

## 2017-02-16 NOTE — Procedures (Signed)
Pre Procedure Dx: Poor venous access Post Procedural Dx: Same  Successful placement of right IJ approach port-a-cath with tip at the superior caval atrial junction. The catheter is ready for immediate use.  Estimated Blood Loss: Minimal  Complications: None immediate.  Jay Shamiracle Gorden, MD Pager #: [phone]   

## 2017-02-16 NOTE — Discharge Instructions (Signed)
Moderate Conscious Sedation, Adult, Care After °These instructions provide you with information about caring for yourself after your procedure. Your health care provider may also give you more specific instructions. Your treatment has been planned according to current medical practices, but problems sometimes occur. Call your health care provider if you have any problems or questions after your procedure. °What can I expect after the procedure? °After your procedure, it is common: °· To feel sleepy for several hours. °· To feel clumsy and have poor balance for several hours. °· To have poor judgment for several hours. °· To vomit if you eat too soon. ° °Follow these instructions at home: °For at least 24 hours after the procedure: ° °· Do not: °? Participate in activities where you could fall or become injured. °? Drive. °? Use heavy machinery. °? Drink alcohol. °? Take sleeping pills or medicines that cause drowsiness. °? Make important decisions or sign legal documents. °? Take care of children on your own. °· Rest. °Eating and drinking °· Follow the diet recommended by your health care provider. °· If you vomit: °? Drink water, juice, or soup when you can drink without vomiting. °? Make sure you have little or no nausea before eating solid foods. °General instructions °· Have a responsible adult stay with you until you are awake and alert. °· Take over-the-counter and prescription medicines only as told by your health care provider. °· If you smoke, do not smoke without supervision. °· Keep all follow-up visits as told by your health care provider. This is important. °Contact a health care provider if: °· You keep feeling nauseous or you keep vomiting. °· You feel light-headed. °· You develop a rash. °· You have a fever. °Get help right away if: °· You have trouble breathing. °This information is not intended to replace advice given to you by your health care provider. Make sure you discuss any questions you have  with your health care provider. °Document Released: 12/28/2012 Document Revised: 08/12/2015 Document Reviewed: 06/29/2015 °Elsevier Interactive Patient Education © 2018 [employer] ° ° °Implanted Port Home Guide °An implanted port is a type of central line that is placed under the skin. Central lines are used to provide IV access when treatment or nutrition needs to be given through a person’s veins. Implanted ports are used for long-term IV access. An implanted port may be placed because: °· You need IV medicine that would be irritating to the small veins in your hands or arms. °· You need long-term IV medicines, such as antibiotics. °· You need IV nutrition for a long period. °· You need frequent blood draws for lab tests. °· You need dialysis. ° °Implanted ports are usually placed in the chest area, but they can also be placed in the upper arm, the abdomen, or the leg. An implanted port has two main parts: °· Reservoir. The reservoir is round and will appear as a small, raised area under your skin. The reservoir is the part where a needle is inserted to give medicines or draw blood. °· Catheter. The catheter is a thin, flexible tube that extends from the reservoir. The catheter is placed into a large vein. Medicine that is inserted into the reservoir goes into the catheter and then into the vein. ° °How will I care for my incision site? °Do not get the incision site wet. Bathe or shower as directed by your health care provider. °How is my port accessed? °Special steps must be taken to access the   port:  Before the port is accessed, a numbing cream can be placed on the skin. This helps numb the skin over the port site.  Your health care provider uses a sterile technique to access the port. ? Your health care provider must put on a mask and sterile gloves. ? The skin over your port is cleaned carefully with an antiseptic and allowed to dry. ? The port is gently pinched between sterile gloves, and a needle  is inserted into the port.  Only "non-coring" port needles should be used to access the port. Once the port is accessed, a blood return should be checked. This helps ensure that the port is in the vein and is not clogged.  If your port needs to remain accessed for a constant infusion, a clear (transparent) bandage will be placed over the needle site. The bandage and needle will need to be changed every week, or as directed by your health care provider.  Keep the bandage covering the needle clean and dry. Do not get it wet. Follow your health care providers instructions on how to take a shower or bath while the port is accessed.  If your port does not need to stay accessed, no bandage is needed over the port.  What is flushing? Flushing helps keep the port from getting clogged. Follow your health care providers instructions on how and when to flush the port. Ports are usually flushed with saline solution or a medicine called heparin. The need for flushing will depend on how the port is used.  If the port is used for intermittent medicines or blood draws, the port will need to be flushed: ? After medicines have been given. ? After blood has been drawn. ? As part of routine maintenance.  If a constant infusion is running, the port may not need to be flushed.  How long will my port stay implanted? The port can stay in for as long as your health care provider thinks it is needed. When it is time for the port to come out, surgery will be done to remove it. The procedure is similar to the one performed when the port was put in. When should I seek immediate medical care? When you have an implanted port, you should seek immediate medical care if:  You notice a bad smell coming from the incision site.  You have swelling, redness, or drainage at the incision site.  You have more swelling or pain at the port site or the surrounding area.  You have a fever that is not controlled with  medicine.  This information is not intended to replace advice given to you by your health care provider. Make sure you discuss any questions you have with your health care provider. Document Released: 03/09/2005 Document Revised: 08/15/2015 Document Reviewed: 11/14/2012 Elsevier Interactive Patient Education  2017 [employer] Insertion, Care After This sheet gives you information about how to care for yourself after your procedure. Your health care provider may also give you more specific instructions. If you have problems or questions, contact your health care provider. What can I expect after the procedure? After your procedure, it is common to have:  Discomfort at the port insertion site.  Bruising on the skin over the port. This should improve over 3-4 days.  Follow these instructions at home: [facility] care  After your port is placed, you will get a manufacturer's information card. The card has information about your port. Keep this card  with you at all times.  Take care of the port as told by your health care provider. Ask your health care provider if you or a family member can get training for taking care of the port at home. A home health care nurse may also take care of the port.  Make sure to remember what type of port you have. Incision care  Follow instructions from your health care provider about how to take care of your port insertion site. Make sure you: ? Wash your hands with soap and water before you change your bandage (dressing). If soap and water are not available, use hand sanitizer. ? Change your dressing as told by your health care provider.  You may remove your dressing tomorrow. ? Leave skin glue in place. These skin closures may need to stay in place for 2 weeks or longer. If adhesive strip edges start to loosen and curl up, you may trim the loose edges. Do not remove adhesive strips completely unless your health care provider tells you to do  that.  DO NOT use EMLA cream for 2 weeks after port placement as this cream will remove surgical glue.  Check your port insertion site every day for signs of infection. Check for: ? More redness, swelling, or pain. ? More fluid or blood. ? Warmth. ? Pus or a bad smell. General instructions  Do not take baths, swim, or use a hot tub until your health care provider approves.  You may shower tomorrow.  Do not lift anything that is heavier than 10 lb (4.5 kg) for a week, or as told by your health care provider.  Ask your health care provider when it is okay to: ? Return to work or school. ? Resume usual physical activities or sports.  Do not drive for 24 hours if you were given a medicine to help you relax (sedative).  Take over-the-counter and prescription medicines only as told by your health care provider.  Wear a medical alert bracelet in case of an emergency. This will tell any health care providers that you have a port.  Keep all follow-up visits as told by your health care provider. This is important. Contact a health care provider if:  You have a fever or chills.  You have more redness, swelling, or pain around your port insertion site.  You have more fluid or blood coming from your port insertion site.  Your port insertion site feels warm to the touch.  You have pus or a bad smell coming from the port insertion site. Get help right away if:  You have chest pain or shortness of breath.  You have bleeding from your port that you cannot control. Summary  Take care of the port as told by your health care provider.  Change your dressing as told by your health care provider.  Keep all follow-up visits as told by your health care provider. This information is not intended to replace advice given to you by your health care provider. Make sure you discuss any questions you have with your health care provider. Document Released: 12/28/2012 Document Revised: 01/29/2016  Document Reviewed: 01/29/2016 Elsevier Interactive Patient Education  2017 [employer].

## 2017-02-18 ENCOUNTER — Telehealth: Payer: Self-pay | Admitting: Hematology and Oncology

## 2017-02-18 ENCOUNTER — Ambulatory Visit (HOSPITAL_BASED_OUTPATIENT_CLINIC_OR_DEPARTMENT_OTHER): Payer: BLUE CROSS/BLUE SHIELD | Admitting: Hematology and Oncology

## 2017-02-18 ENCOUNTER — Ambulatory Visit: Payer: BLUE CROSS/BLUE SHIELD | Attending: Gynecologic Oncology | Admitting: Gynecologic Oncology

## 2017-02-18 VITALS — BP 151/86 | HR 78 | Temp 97.6°F | Resp 18 | Wt 287.0 lb

## 2017-02-18 VITALS — BP 128/70 | HR 73 | Temp 98.5°F | Resp 18 | Ht 67.0 in | Wt 287.0 lb

## 2017-02-18 DIAGNOSIS — Z8543 Personal history of malignant neoplasm of ovary: Secondary | ICD-10-CM | POA: Diagnosis not present

## 2017-02-18 DIAGNOSIS — Z923 Personal history of irradiation: Secondary | ICD-10-CM | POA: Insufficient documentation

## 2017-02-18 DIAGNOSIS — Z9221 Personal history of antineoplastic chemotherapy: Secondary | ICD-10-CM | POA: Insufficient documentation

## 2017-02-18 DIAGNOSIS — Z8742 Personal history of other diseases of the female genital tract: Secondary | ICD-10-CM | POA: Diagnosis not present

## 2017-02-18 DIAGNOSIS — E041 Nontoxic single thyroid nodule: Secondary | ICD-10-CM

## 2017-02-18 DIAGNOSIS — C562 Malignant neoplasm of left ovary: Secondary | ICD-10-CM

## 2017-02-18 DIAGNOSIS — T451X5A Adverse effect of antineoplastic and immunosuppressive drugs, initial encounter: Secondary | ICD-10-CM

## 2017-02-18 DIAGNOSIS — Z7189 Other specified counseling: Secondary | ICD-10-CM

## 2017-02-18 DIAGNOSIS — G620 Drug-induced polyneuropathy: Secondary | ICD-10-CM

## 2017-02-18 DIAGNOSIS — Z90710 Acquired absence of both cervix and uterus: Secondary | ICD-10-CM | POA: Insufficient documentation

## 2017-02-18 DIAGNOSIS — C561 Malignant neoplasm of right ovary: Secondary | ICD-10-CM

## 2017-02-18 DIAGNOSIS — C569 Malignant neoplasm of unspecified ovary: Secondary | ICD-10-CM

## 2017-02-18 DIAGNOSIS — Z170 Estrogen receptor positive status [ER+]: Secondary | ICD-10-CM

## 2017-02-18 DIAGNOSIS — C541 Malignant neoplasm of endometrium: Secondary | ICD-10-CM | POA: Diagnosis not present

## 2017-02-18 DIAGNOSIS — C55 Malignant neoplasm of uterus, part unspecified: Secondary | ICD-10-CM

## 2017-02-18 DIAGNOSIS — Z95828 Presence of other vascular implants and grafts: Secondary | ICD-10-CM | POA: Diagnosis not present

## 2017-02-18 DIAGNOSIS — Z90722 Acquired absence of ovaries, bilateral: Secondary | ICD-10-CM | POA: Diagnosis not present

## 2017-02-18 NOTE — Progress Notes (Signed)
GYNECOLOGIC ONCOLOGY MULTI-DISCIPLINARY DISPOSITION CONFERENCE NOTE  Date of Conference: 02/03/17  Patient Name: [facility] Record Number: [id number]  Referring Provider: N/A  Primary Oncologist: Dr. Janie Morning  History of Present Illness:  [name] is a 51 y.o. woman who presented with history of synchronous stage IVB uterine carcinosarcoma and unstaged clear cell carcinoma arising from endometriosis in the left ovary diagnosed in 2014.  Treatment History:        Uterine carcinosarcoma (CMS-HCC)   02/26/2013 Initial Diagnosis    Uterine carcinosarcoma (CMS-HCC)  02/22/13 CT showing 24.1 x 16.8 x 19.9 cm complex cystic mass. Early omental disease. Implant on the liver surface. Small volume ascites. Small nodes above the diaphragm. Compresion of the IVC with large DVT in the right femoral. Possible pulmonary infarction. Trace right pleural effusion.      02/2013 - 12/2013 Chemotherapy    Carboplatin / paclitaxel x 9 cycles      07/2013 Surgery    Interval TAH, BSO, omentectomy  Left ovary 20 cm clear cell carcinoma  Uterine carcinosarcoma  DOI: 2.1 of 2.5 cm      - 03/09/2014 Radiation    Vaginal cuff brachytherapy  30 Gy in 5 fractions  HDR with Ir-192      11/2016 Recurrence    Lesion noted on exam in the RV septum  CT showing a 2.7 cm soft tissue nodule      Physical Exam:  General: Alert, oriented, no acute distress.  HEENT: Normocephalic, atraumatic. Sclera anicteric, posterior oropharynx clear. Normal dentition.  Chest: Clear to auscultation bilaterally.  Cardiovascular: Regular rate and rhythm, no murmurs, rubs, or gallops.  Breasts: Deferred  Abdomen: Obese.Bowel sounds present. Soft, nondistended, nontender to palpation. No masses or hepatosplenomegaly appreciated. No evidence of hernia. No palpable fluid wave.  Extremities: Grossly normal range of motion. Warm, well perfused. No edema bilaterally.  Skin: No rashes or lesions.   Lymphatics: No cervical or supraclavicular adenopathy.  GU: Deferred  Exam per Dr. Skeet Latch on 02/14/17:  "Genitourinary: Nl EGBUS, Vagina without bleeding or discharge. Vagina atrophic. Cuff intact. No cul de sac masses.  Rectal: Good tone, 2 cm rubbery lesion in the RV septum.  LN: No cervical supraclavicular or inguinal adenopathy"  Preop Labs and Imaging:  01/05/17 PET 1. Previously noted pelvic mass is similar in size and appearance to the prior examination from 12/23/2016 demonstrating some low-level hypermetabolism. This is nonspecific, and could represent a lesion of rectal, cervical or uterine origin. 2. No definite findings to suggest metastatic disease in the chest, abdomen or pelvis. 3. Small focus of hypermetabolism in the distal third of the esophagus without a perceivable abnormality on the CT portion of the examination. This could represent physiologic activity, a focal area of esophagitis, or an underlying lesion. Clinical correlation is recommended, with consideration for followup upper endoscopy if clinically appropriate. 4. Nonspecific low-level hypermetabolism in a left axillary lymph node which is stable in size and appearance compared to prior examinations. This is favored to be benign, but correlation with routine annual mammography is recommended. 5. Dilatation of the pulmonic trunk (4 cm in diameter), concerning for pulmonary arterial hypertension. 6. Severe hepatic steatosis.  Procedure on 01/27/17: Diagnostic laparoscopy, robotic-assisted resection of intraperitoneal mass, infracolic omentectomy, peritoneal biopsies, rigid proctoscopy  Operative Findings: On bimanual exam, 2.5 cm firm mass at the apex of the vagina in the rectovaginal septum best palpated on rectovaginal exam. Normal abdominal survey including visualized portions of the diaphragm, liver, stomach, omentum, and bowels.  Dense adhesion of two loops of bowel to the anterior abdominal wall.  Surgically absent pelvic organs. No visible masses. Some small lesions in the pelvic peritoneum-- biopsy performed. With sponge stick in the vagina and EA sizer in the rectum, fullness noted rectovaginal septum. R1 resection with miliary disease remaining in tumor bed.  Pathology:  Final Diagnosis  A: Cul-de-sac mass, biopsy  - Endometrioid adenocarcinoma, FIGO grade 1 (architecture grade 1, nuclear grade 2) (see comment)  B: Peritoneum, pelvic, biopsy  - Fragments of fibrovascular tissue with hemorrhage  - No malignancy identified  C: Cul-de-sac, biopsy  - Endometrioid adenocarcinoma, FIGO grade 1 (architecture grade 1, nuclear grade 2), with squamous differentiation (see comment)  D: Omentum, omentectomy  - Benign omentum  - No malignancy identified  Stage/Disposition: [name] is a 51 y.o. woman with FIGO grade I endometrioid adenocarcinoma, likely recurrence of prior carcinosarcoma vs new malignancy arising from an endometriotic implant. Disposition is to systemic chemotherapy. Request ER/PR staining and MSI testing. Consider antiestrogenic maintenance therapy following completion of chemo.  This Multidisciplinary conference took place involving physicians from [place], Pathology, and Radiation Oncology as well a Dietitian, Musician, Gynecologic Oncology Nurse Clinicians and Gynecologic Oncology Service fellows and residents. Comprehensive assessment of the patient's malignancy, staging, need for surgery, chemotherapy, radiation therapy, eligibility for clinical trial participation and need for further testing were reviewed. Supportive measures, both inpatient and following discharge were also discussed. The recommended plan of care is documented. Greater than 35 minutes were spent correlating and coordinating this patient's care.

## 2017-02-18 NOTE — Progress Notes (Signed)
GYNECOLOGIC ONCOLOGY OUTPATIENT  NOTE   Patient Name: Caitlyn Higgins  Medical Record Number: [id number]  Referring Provider: N/A    Chief Complaint:  History of left clear cell ovarian cancer. Recurrent endometrial cancer. Treatment planning  History of Present Illness:  Caitlyn Higgins is a 51 y.o. woman who presented with history of synchronous stage IVB uterine carcinosarcoma and unstaged clear cell carcinoma arising from endometriosis in the left ovary diagnosed in 2014.  Treatment History:        Uterine carcinosarcoma (CMS-HCC)   02/26/2013 Initial Diagnosis    Uterine carcinosarcoma (CMS-HCC)  02/22/13 CT showing 24.1 x 16.8 x 19.9 cm complex cystic mass. Early omental disease. Implant on the liver surface. Small volume ascites. Small nodes above the diaphragm. Compresion of the IVC with large DVT in the right femoral. Possible pulmonary infarction. Trace right pleural effusion.      02/2013 - 12/2013 Chemotherapy    Carboplatin / paclitaxel x 9 cycles      07/2013 Surgery    Interval TAH, BSO, omentectomy  Left ovary 20 cm clear cell carcinoma  Uterine carcinosarcoma  DOI: 2.1 of 2.5 cm      - 03/09/2014 Radiation    Vaginal cuff brachytherapy  30 Gy in 5 fractions  HDR with Ir-192      11/2016 Recurrence    Lesion noted on exam in the RV septum  CT showing a 2.7 cm soft tissue nodule      01/05/17 PET 1. Previously noted pelvic mass is similar in size and appearance to the prior examination from 12/23/2016 demonstrating some low-level hypermetabolism. This is nonspecific, and could represent a lesion of rectal, cervical or uterine origin. 2. No definite findings to suggest metastatic disease in the chest, abdomen or pelvis. 3. Small focus of hypermetabolism in the distal third of the esophagus without a perceivable abnormality on the CT portion of the examination. This could represent physiologic activity, a focal area of esophagitis, or an underlying  lesion. Clinical correlation is recommended, with consideration for followup upper endoscopy if clinically appropriate. 4. Nonspecific low-level hypermetabolism in a left axillary lymph node which is stable in size and appearance compared to prior examinations. This is favored to be benign, but correlation with routine annual mammography is recommended. 5. Dilatation of the pulmonic trunk (4 cm in diameter), concerning for pulmonary arterial hypertension. 6. Severe hepatic steatosis.  Procedure on 01/27/17: Diagnostic laparoscopy, robotic-assisted resection of intraperitoneal mass, infracolic omentectomy, peritoneal biopsies, rigid proctoscopy  Operative Findings: On bimanual exam, 2.5 cm firm mass at the apex of the vagina in the rectovaginal septum best palpated on rectovaginal exam. Normal abdominal survey including visualized portions of the diaphragm, liver, stomach, omentum, and bowels. Dense adhesion of two loops of bowel to the anterior abdominal wall. Surgically absent pelvic organs. No visible masses. Some small lesions in the pelvic peritoneum-- biopsy performed. With sponge stick in the vagina and EA sizer in the rectum, fullness noted rectovaginal septum. R1 resection with miliary disease remaining in tumor bed.  Pathology:  Final Diagnosis  A: Cul-de-sac mass, biopsy  - Endometrioid adenocarcinoma, FIGO grade 1 (architecture grade 1, nuclear grade 2) (see comment)  B: Peritoneum, pelvic, biopsy  - Fragments of fibrovascular tissue with hemorrhage  - No malignancy identified  C: Cul-de-sac, biopsy  - Endometrioid adenocarcinoma, FIGO grade 1 (architecture grade 1, nuclear grade 2), with squamous differentiation (see comment)  D: Omentum, omentectomy  - Benign omentum  - No malignancy identified  ROS:  No fever  or chills no nausea vomiting no flank pain denies vaginal or rectal bleeding no change in bowel or bladder habits. No abdominal pain. Reports drainage from one of the port  sites, no sensory or motor changes otherwise review of symptoms is noncontributory    BP (!) 151/86 (Patient Position: Sitting)   Pulse 78   Temp 97.6 F (36.4 C)   Resp 18   Wt 287 lb (130.2 kg)   LMP 03/09/2013   BMI 44.95 kg/m   General: Alert, oriented, no acute distress.  HEENT: Normocephalic, atraumatic. Sclera anicteric, posterior oropharynx clear. Normal dentition.  Abdomen: Obese.Bowel sounds present. Soft, nondistended, nontender to palpation. No masses or hepatosplenomegaly appreciated. No evidence of hernia. No palpable fluid wave. Separation at site of supraumbilical port incision. No evidence of hernia erythema or masses Extremities: Grossly normal range of motion. Warm, well perfused. No edema bilaterally.  Skin: No rashes or lesions.  Lymphatics: No cervical or supraclavicular adenopathy.  GU: Deferred  Exam per Dr. Skeet Latch on 02/14/17:  LN: No cervical supraclavicular or inguinal adenopathy"    Assessment/Plan: Caitlyn Higgins is a 51 y.o. woman with FIGO grade I endometrioid adenocarcinoma, likely recurrence of prior carcinosarcoma vs new malignancy arising from an endometriotic implant.Recommendation is systemic chemotherapy with Taxol Carboplatin Assessed by Dr. Alvy Bimler Follow-up with Gyn Onc in 10 weeks.  MSI stable.  ER+/PR+ Consider antiestrogenic maintenance therapy following completion of chemo.   Offered patient resection of rectosigmoid colon and upper vaginectomy with the understanding that temporary diversion may be necessary given prior vaginal radiation.  Caitlyn Higgins declines.

## 2017-02-18 NOTE — Progress Notes (Signed)
START OFF PATHWAY REGIMEN - Uterine   [id number]:Carboplatin AUC=6 + Paclitaxel 175 mg/m2 q21 Days:   A cycle is every 21 days:     Paclitaxel      Carboplatin   **Always confirm dose/schedule in your pharmacy ordering system**    Patient Characteristics: Endometrioid Histology, Second Line, Vaginal Recurrence Only, No Prior Radiation Therapy AJCC T Category: T1 AJCC N Category: N1 AJCC M Category: M0 AJCC 8 Stage Grouping: IIIC1 Line of therapy: Second Line Would you be surprised if this patient died  in the next year<= I would be surprised if this patient died in the next year Radiation Therapy Status: No Prior Radiation Therapy Intent of Therapy: Non-Curative / Palliative Intent, Discussed with Patient

## 2017-02-18 NOTE — Telephone Encounter (Signed)
Gave avs and calendar for January however waiting on cindy for 12/13

## 2017-02-19 ENCOUNTER — Telehealth: Payer: Self-pay | Admitting: Hematology and Oncology

## 2017-02-19 NOTE — Telephone Encounter (Signed)
Called patient regarding 12/13

## 2017-02-22 ENCOUNTER — Encounter: Payer: Self-pay | Admitting: Hematology and Oncology

## 2017-02-22 DIAGNOSIS — Z7189 Other specified counseling: Secondary | ICD-10-CM | POA: Insufficient documentation

## 2017-02-22 NOTE — Assessment & Plan Note (Signed)
She had IVC filter in situ It has been left behind for so many years and I doubt it can be removed For now, I recommend she takes aspirin to reduce the risk of thrombosis

## 2017-02-22 NOTE — Assessment & Plan Note (Signed)
We reviewed the NCCN guidelines We discussed the role of chemotherapy. The intent is of palliative intent.  We discussed some of the risks, benefits, side-effects of carboplatin & Taxol. Treatment is intravenous, every 3 weeks x 6 cycles  Some of the short term side-effects included, though not limited to, including weight loss, life threatening infections, risk of allergic reactions, need for transfusions of blood products, nausea, vomiting, change in bowel habits, loss of hair, admission to hospital for various reasons, and risks of death.   Long term side-effects are also discussed including risks of infertility, permanent damage to nerve function, hearing loss, chronic fatigue, kidney damage with possibility needing hemodialysis, and rare secondary malignancy including bone marrow disorders.  The patient is aware that the response rates discussed earlier is not guaranteed.  After a long discussion, patient made an informed decision to proceed with the prescribed plan of care.   Patient education material was dispensed. We discussed premedication with dexamethasone before chemotherapy. I will support her with G-CSF after treatment The patient will need additional endocrine therapy as maintenance treatment after chemotherapy

## 2017-02-22 NOTE — Assessment & Plan Note (Signed)
The patient is aware she has incurable disease and treatment is strictly palliative. We discussed importance of Advanced Directives and Living will. 

## 2017-02-22 NOTE — Assessment & Plan Note (Signed)
Recommend increased dose gabapentin I plan to reduce the dose of chemotherapy a little bit

## 2017-02-22 NOTE — Progress Notes (Signed)
[place] FOLLOW-UP progress notes  Patient Care Team: Shirline Frees, MD as PCP - General (Family Medicine)  CHIEF COMPLAINTS/PURPOSE OF VISIT:  Recurrent endometrial cancer  HISTORY OF PRESENTING ILLNESS:  Caitlyn Higgins 51 y.o. female was transferred to my care after her prior physician has left.  I reviewed the patient's records extensive and collaborated the history with the patient. Summary of her history is as follows: Oncology History   Recurrent endometrial cancer, strong ER positive 90%, PR 90%, MSI stable Clear cell mixed features of ovaries in 2015 Recurrent disease 2018: low grade endometroid       Endometrial cancer ([place])   02/24/2013 Miscellaneous    Pulmonary embolus  IVC filter was placed and she was started on Lovenox.  She was subsequently transitioned to warfarin.       03/07/2013 Pathology Results    1. Cervix, biopsy - HIGH GRADE ENDOMETRIAL CARCINOMA, SEE COMMENT. 2. Endometrium, biopsy - HIGH GRADE ENDOMETRIAL CARCINOMA, SEE COMMENT. Microscopic Comment 1. , 2. In both parts 1 and 2, slide sections demonstrate extensive involvement by high grade endometrial carcinoma demonstrating a biphasic epithelial and stromal architecture. The immunophenotype (strong diffuse estrogen receptor; strong diffuse vimentin expression; patchy monoclonal CEA expression, patchy p16 immunostain expression, and diffuse mild to moderate p53 expression) is consistent with primary endometrial origin. 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       03/14/2013 - 01/04/2014 Chemotherapy    taxol carboplatin x 9      08/01/2013 Surgery    Procedure(s) Performed: 1. Exploratory laparotomy with total hysterectomy bilateral salpingo-oophorectomy, omental biopsies.  Surgeon: Francetta Found.  Skeet Latch, MD., PhD  Assistant Surgeon:  Lahoma Crocker M.D.   Specimens: Uterus Cervix, Bilateral tubes / ovaries, lower  omentum.   Operative Findings: A 24cm left ovarian mass hemosiderine filled with omental adhesions.  Evidence of chemotherapeutic changes over the bowel.  Uterus 14 cm.  No palpable hepatic mets but the examination was limited by diaphragmatic adhesions.  No gross disease in the omentum. TAH BSO omentectomy Left ovary 20cm clear cell malignancy  Uterus Carcinosarcoma  Multiple microscopic foci, measuring up to 0.5 cm in greatest dimension. Histologic type: Carcinosarcoma (MMMT) with heterologous component (ossification) Grade: High grade Myometrial invasion: 2.1 cm where myometrium is 2.5 cm in thickness Cervical stromal involvement: No.      08/01/2013 Pathology Results    1. Adnexa - ovary +/- tube, neoplastic, left with omentum - HIGH GRADE CARCINOMA WITH BOTH CLEAR CELL CARCINOMA AND ENDOMETRIOID CARCINOMA COMPONENTS, 20 CM. PLEASE SEE ONCOLOGY TEMPLATE FOR DETAIL. 2. Uterus and cervix, with right fallopian tube and right ovary - MICROSCOPIC FOCI OF RESIDUAL CARCINOSARCOMA WITH HETEROLOGOUS ELEMENT, INVOLVING OUTER HALF OF THE MYOMETRIUM. - EXTENSIVE ADENOMYOSIS AND LEIOMYOMA. - CERVIX: BENIGN SQUAMOUS MUCOSA AND ENDOCERVICAL MUCOSA, NO DYSPLASIA OR MALIGNANCY. - RIGHT OVARY AND FALLOPIAN TUBES: NO PATHOLOGIC ABNORMALITIES. Microscopic Comment  1. OVARY Specimen(s): Left ovary and fallopian tube. Procedure: (including lymph node sampling): Left salpingo-oophorectomy Primary tumor site (including laterality): Left ovary Ovarian surface involvement: Present. Ovarian capsule intact without fragmentation: No. Maximum tumor size (cm): 20 cm, gross measurement. Histologic type: Mixed surface epithelial carcinoma with predominant clear cell carcinoma component (70%) and minor endometrioid component (30%) Please see comment for detail. Grade: High grade Peritoneal implants: (specify invasive or  non-invasive): N/A Pelvic extension (list additional structures on separate lines and if involved): No.  Lymph nodes: number examined - N/A ; number positive - N/A TNM code: ypT1c2, pNX FIGO Stage (based on pathologic findings, needs clinical correlation): at least IC2 Comments: Received grossly is a 20 cm focally disrupted and partially collapsed multicystic ovary with attached/adherent omentum. Microscopically, sectioned show an invasive high grade surface carcinoma arising in a background of endometriotic cyst. The tumor is predominately composed of clear cell carcinoma component with minor endometrioid carcinoma component. No tumor involvement is identified in the adherent omentum tissue. Immunohistochemical stains were performed and the clear cell carcinoma is patchy positive for p16, ER, p53, negative for WT-1 and p63. Focal angiolymphatic invasion is present  The morphologic features are different from those seen in the uterus. 2. ONCOLOGY TABLE-UTERUS, CARCINOSARCOMA Specimen: Uterus, cervix and right fallopian tubes and ovaries. Procedure: Total hysterectomy and right salpingo-oophorectomy. Lymph node sampling performed: No. Specimen integrity: Intact. Maximum tumor size: Multiple microscopic foci, measuring up to 0.5 cm in greatest dimension. Histologic type: Carcinosarcoma (MMMT) with heterologous component (ossification) Grade: High grade Myometrial invasion: 2.1 cm where myometrium is 2.5 cm in thickness Cervical stromal involvement: No. Extent of involvement of other organs: No. Lymph - vascular invasion: Not identified. Peritoneal washings: N/A Lymph nodes: # examined N/A ; # positive N/A Pelvic lymph nodes: N/A involved of N/A lymph nodes. Para-aortic lymph nodes: N/A involved of N/A lymph nodes. Other (specify involvement and site): N/A TNM code: ypT1b, ypNX FIGO Stage (based on pathologic findings, needs clinical correlation): IB Comment: Immunohistochemical stains were  performed and the residual carcinoma is positive for p16, negative for WT-1, weakly positive for p53 and ER. The morphologic features are different from those seen in the left ovary. (HCL:gt, 08/04/13)      02/21/2014 - 03/09/2014 Radiation Therapy    Received vaginal brachytherapy TREATMENT DATES: 12/2, 12/7, 12/11, 12/14 and 03/09/14  SITE/DOSE: Vaginal Cuff 4 cm treatment length / 30 Gy in 5 fractions  Technique: HDR with Ir-192       12/17/2016 Relapse/Recurrence    2cm rubbery like mass in the RV septum on physical examination      12/23/2016 Imaging    Reproductive: Previous hysterectomy. New soft tissue mass seen between the vaginal cuff and anterior rectal wall which measures 2.7 x 2.6 cm on image 112/ 2. This is consistent with locally recurrent endometrial carcinoma.      01/05/2017 Imaging    1. Previously noted pelvic mass is similar in size and appearance to the prior examination from 12/23/2016 demonstrating some low-level hypermetabolism. This is nonspecific, and could represent a lesion of rectal, cervical or uterine origin. 2. No definite findings to suggest metastatic disease in the chest, abdomen or pelvis. 3. Small focus of hypermetabolism in the distal third of the esophagus without a perceivable abnormality on the CT portion of the examination. This could represent physiologic activity, a focal area of esophagitis, or an underlying lesion. Clinical correlation is recommended, with consideration for followup upper endoscopy if clinically appropriate. 4. Nonspecific low-level hypermetabolism in a left axillary lymph node which is stable in size and appearance compared to prior examinations. This is favored to be benign, but correlation with routine annual mammography is recommended. 5. Dilatation of the pulmonic trunk (4 cm in diameter), concerning for pulmonary arterial hypertension. 6.  Severe hepatic steatosis.      01/05/2017 PET scan    1. Previously noted pelvic mass is similar in size and appearance to the prior examination from 12/23/2016 demonstrating some low-level hypermetabolism. This is nonspecific, and could  represent a lesion of rectal, cervical or uterine origin. 2. No definite findings to suggest metastatic disease in the chest, abdomen or pelvis. 3. Small focus of hypermetabolism in the distal third of the esophagus without a perceivable abnormality on the CT portion of the examination. This could represent physiologic activity, a focal area of esophagitis, or an underlying lesion. Clinical correlation is recommended, with consideration for followup upper endoscopy if clinically appropriate. 4. Nonspecific low-level hypermetabolism in a left axillary lymph node which is stable in size and appearance compared to prior examinations. This is favored to be benign, but correlation with routine annual mammography is recommended. 5. Dilatation of the pulmonic trunk (4 cm in diameter), concerning for pulmonary arterial hypertension. 6. Severe hepatic steatosis. 7. Aortic atherosclerosis, in addition to left anterior descending coronary artery disease. Please note that although the presence of coronary artery calcium documents the presence of coronary artery disease, the severity of this disease and any potential stenosis cannot be assessed on this non-gated CT examination. Assessment for potential risk factor modification, dietary therapy or pharmacologic therapy may be warranted, if clinically indicated. 8. Additional incidental findings, as above. Aortic Atherosclerosis (ICD10-I70.0).      01/27/2017 Surgery    She underwent diagnostic laparoscopy, robotic assisted resection of intraperitoneal mass, infracolic omentectomy, peritoneal biopsies and rigid proctoscopy. R1 resection with miliary disease remaining in tumor bed      02/04/2017 Pathology Results    A:  Cul-de-sac mass, biopsy  - Endometrioid adenocarcinoma, FIGO grade 1 (architecture grade 1, nuclear grade 2) (see comment)  B: Peritoneum, pelvic, biopsy  - Fragments of fibrovascular tissue with hemorrhage - No malignancy identified  C: Cul-de-sac, biopsy  - Endometrioid adenocarcinoma, FIGO grade 1 (architecture grade 1, nuclear grade 2), with squamous differentiation (see comment)  D: Omentum, omentectomy  - Benign omentum  - No malignancy identified  The purpose of this addendum is to report the results of immunohistochemical stains for mismatch repair proteins and hormone receptors performed on a representative block of tumor.  Immunostains for mismatch repair proteins were performed on block C2 and show intact expression of MLH1, PMS2, MSH2, and MSH6 within the tumor. This is the normal phenotype and does NOT support a diagnosis of microsatellite instability/hereditary non-polyposis colorectal cancer (HNPCC) associated carcinoma. Molecular testing for additional microsatellite instability markers will be performed by the [facility] [phone]) and reported separately.  Estrogen receptor (Ventana, clone SP1):  Positive (90%, weak to moderate)  Progesterone receptor (Ventana, clone 1E2):  Positive (90%, strong)      02/16/2017 Procedure    Successful placement of a right internal jugular approach power injectable Port-A-Cath. The catheter is ready for immediate use.       Ovarian cancer ([place])   08/01/2013 Initial Diagnosis    Ovarian cancer ([place]) Unstaged.  Incidental pathology finding      2016 Genetic Testing     Results revealed patient has the following mutation(s): NEGATIVE       Since recent surgery, she is doing well She denies pain Her wound is healing well No fever or signs of infections No significant incisional pain She had persistent mild peripheral neuropathy from prior chemotherapy  MEDICAL HISTORY:  Past Medical History:   Diagnosis Date  . Anemia   . Cancer (HCC)    uterine carcinosarcoma  . DVT (deep venous thrombosis) (HCC)    right leg 12'14  . Elevated blood uric acid level    tx. Allopurinol  . Family history of malignant  neoplasm of breast   . GERD (gastroesophageal reflux disease)   . History of chemotherapy   . Hx of radiation therapy HDR   x 5 tx  completed 03/09/14  . Hypertension   . Ovarian cancer ([place]) 2015  . Pulmonary emboli ([place])    12'14 denies any sob  . Reflux   . Transfusion history 07-26-13   12'14, 04-14-13(2 units), 06-09-13    SURGICAL HISTORY: Past Surgical History:  Procedure Laterality Date  . ABDOMINAL HYSTERECTOMY N/A 08/01/2013   Procedure: EXPLORATORY LAPAROTOMY/HYSTERECTOMY TOTAL ABDOMINAL;  Surgeon: Janie Morning, MD;  Location: [facility];  Service: Gynecology;  Laterality: N/A;  . IR FLUORO GUIDE PORT INSERTION RIGHT  02/16/2017  . IR US GUIDE VASC ACCESS RIGHT  02/16/2017  . IVC Right    filter placed to prevent Pulmonary emboli  . PORTACATH PLACEMENT Right    rigth chest- remains  . removed tumor from rectum    . SALPINGOOPHORECTOMY Bilateral 08/01/2013   Procedure: BILATERAL SALPINGO OOPHORECTOMY/OMENTECTOMY ;  Surgeon: Janie Morning, MD;  Location: [facility];  Service: Gynecology;  Laterality: Bilateral;    SOCIAL HISTORY: Social History   Socioeconomic History  . Marital status: Married    Spouse name: Not on file  . Number of children: Not on file  . Years of education: Not on file  . Highest education level: Not on file  Social Needs  . Financial resource strain: Not on file  . Food insecurity - worry: Not on file  . Food insecurity - inability: Not on file  . Transportation needs - medical: Not on file  . Transportation needs - non-medical: Not on file  Occupational History  . Not on file  Tobacco Use  . Smoking status: Never Smoker  . Smokeless tobacco: Never Used  Substance and Sexual Activity  . Alcohol use: No    Comment: Quit  2 yrs  ago-social in past  . Drug use: No  . Sexual activity: Yes  Other Topics Concern  . Not on file  Social History Narrative  . Not on file    FAMILY HISTORY: Family History  Problem Relation Age of Onset  . Diabetes Mother   . Hypertension Mother   . Hypertension Father   . Diabetes Father   . Prostate cancer Father 72       deceased 51  . Breast cancer Maternal Aunt 74       deceased 24  . Prostate cancer Maternal Uncle        5 of 6 mat uncles with prostate cancer; deceased  . Breast cancer Paternal Aunt 61       deceased 70  . Stomach cancer Paternal Grandmother        deceased 37  . Breast cancer Maternal Aunt 60       deceased 9  . Breast cancer Cousin 20       negative BRCAplus, PALB2 2015  . Breast cancer Paternal Aunt 71       deceased 37  . Thyroid disease Neg Hx     ALLERGIES:  has No Known Allergies.  MEDICATIONS:  Current Outpatient Medications  Medication Sig Dispense Refill  . aspirin EC 81 MG tablet Take 81 mg by mouth daily.    . cetirizine (ZYRTEC) 10 MG tablet Take 10 mg by mouth daily.     No current facility-administered medications for this visit.     REVIEW OF SYSTEMS:   Constitutional: Denies fevers, chills or abnormal night sweats Eyes:  Denies blurriness of vision, double vision or watery eyes Ears, nose, mouth, throat, and face: Denies mucositis or sore throat Respiratory: Denies cough, dyspnea or wheezes Cardiovascular: Denies palpitation, chest discomfort or lower extremity swelling Gastrointestinal:  Denies nausea, heartburn or change in bowel habits Skin: Denies abnormal skin rashes Lymphatics: Denies new lymphadenopathy or easy bruising Neurological:Denies numbness, tingling or new weaknesses Behavioral/Psych: Mood is stable, no new changes  All other systems were reviewed with the patient and are negative.  PHYSICAL EXAMINATION: ECOG PERFORMANCE STATUS: 1 - Symptomatic but completely ambulatory  Vitals:   02/18/17 1115  BP:  128/70  Pulse: 73  Resp: 18  Temp: 98.5 F (36.9 C)   Filed Weights   02/18/17 1115  Weight: 287 lb (130.2 kg)    GENERAL:alert, no distress and comfortable.  She is morbidly obese SKIN: skin color, texture, turgor are normal, no rashes or significant lesions EYES: normal, conjunctiva are pink and non-injected, sclera clear OROPHARYNX:no exudate, normal lips, buccal mucosa, and tongue  NECK: supple, thyroid normal size, non-tender, without nodularity LYMPH:  no palpable lymphadenopathy in the cervical, axillary or inguinal LUNGS: clear to auscultation and percussion with normal breathing effort HEART: regular rate & rhythm and no murmurs without lower extremity edema ABDOMEN:abdomen soft, non-tender and normal bowel sounds.  Well-healed surgical scar Musculoskeletal:no cyanosis of digits and no clubbing  PSYCH: alert & oriented x 3 with fluent speech NEURO: no focal motor/sensory deficits  LABORATORY DATA:  I have reviewed the data as listed Lab Results  Component Value Date   WBC 7.1 02/16/2017   HGB 11.4 (L) 02/16/2017   HCT 34.4 (L) 02/16/2017   MCV 93.0 02/16/2017   PLT 237 02/16/2017   Recent Labs    03/09/16 0924 02/16/17 1209  NA 142 137  K 3.7 4.6  CL  --  102  CO2 26 26  GLUCOSE 98 95  BUN 16.8 14  CREATININE 1.1 1.15*  CALCIUM 9.2 9.3  GFRNONAA  --  53*  GFRAA  --  >60  PROT 8.6*  --   ALBUMIN 3.3*  --   AST 25  --   ALT 20  --   ALKPHOS 111  --   BILITOT 0.60  --     RADIOGRAPHIC STUDIES: I have reviewed her recent PET/CT scan and CT scan I have personally reviewed the radiological images as listed and agreed with the findings in the report. Ir US Guide Vasc Access Right  Result Date: 02/16/2017 INDICATION: History of recurrent uterine cancer. In need of durable intravenous access for chemotherapy administration. EXAM: IMPLANTED PORT A CATH PLACEMENT WITH ULTRASOUND AND FLUOROSCOPIC GUIDANCE COMPARISON:  None. MEDICATIONS: Ancef 2 gm IV; The  antibiotic was administered within an appropriate time interval prior to skin puncture. ANESTHESIA/SEDATION: Moderate (conscious) sedation was employed during this procedure. A total of Versed 2 mg and Fentanyl 100 mcg was administered intravenously. Moderate Sedation Time: 30 minutes. The patient's level of consciousness and vital signs were monitored continuously by radiology nursing throughout the procedure under my direct supervision. CONTRAST:  None FLUOROSCOPY TIME:  2 minutes, 6 seconds (16.1 mGy) COMPLICATIONS: None immediate. PROCEDURE: The procedure, risks, benefits, and alternatives were explained to the patient. Questions regarding the procedure were encouraged and answered. The patient understands and consents to the procedure. The right neck and chest were prepped with chlorhexidine in a sterile fashion, and a sterile drape was applied covering the operative field. Maximum barrier sterile technique with sterile gowns and gloves were used for  the procedure. A timeout was performed prior to the initiation of the procedure. Local anesthesia was provided with 1% lidocaine with epinephrine. After creating a small venotomy incision, a micropuncture kit was utilized to access the internal jugular vein. Real-time ultrasound guidance was utilized for vascular access including the acquisition of a permanent ultrasound image documenting patency of the accessed vessel. The microwire was utilized to measure appropriate catheter length. A subcutaneous port pocket was then created along the upper chest wall utilizing a combination of sharp and blunt dissection. The pocket was irrigated with sterile saline. A single lumen Non-ISP power injectable port was chosen for placement. The 8 Fr catheter was tunneled from the port pocket site to the venotomy incision. The port was placed in the pocket. The external catheter was trimmed to appropriate length. At the venotomy, an 8 Fr peel-away sheath was placed over a guidewire  under fluoroscopic guidance. The catheter was then placed through the sheath and the sheath was removed. Final catheter positioning was confirmed and documented with a fluoroscopic spot radiograph. The port was accessed with a Huber needle, aspirated and flushed with heparinized saline. The venotomy site was closed with an interrupted 4-0 Vicryl suture. The port pocket incision was closed with interrupted 2-0 Vicryl suture and the skin was opposed with a running subcuticular 4-0 Vicryl suture. Dermabond and Steri-strips were applied to both incisions. Dressings were placed. The patient tolerated the procedure well without immediate post procedural complication. FINDINGS: After catheter placement, the tip lies within the superior cavoatrial junction. The catheter aspirates and flushes normally and is ready for immediate use. IMPRESSION: Successful placement of a right internal jugular approach power injectable Port-A-Cath. The catheter is ready for immediate use. Electronically Signed   By: Sandi Mariscal M.D.   On: 02/16/2017 14:49   Ir Fluoro Guide Port Insertion Right  Result Date: 02/16/2017 INDICATION: History of recurrent uterine cancer. In need of durable intravenous access for chemotherapy administration. EXAM: IMPLANTED PORT A CATH PLACEMENT WITH ULTRASOUND AND FLUOROSCOPIC GUIDANCE COMPARISON:  None. MEDICATIONS: Ancef 2 gm IV; The antibiotic was administered within an appropriate time interval prior to skin puncture. ANESTHESIA/SEDATION: Moderate (conscious) sedation was employed during this procedure. A total of Versed 2 mg and Fentanyl 100 mcg was administered intravenously. Moderate Sedation Time: 30 minutes. The patient's level of consciousness and vital signs were monitored continuously by radiology nursing throughout the procedure under my direct supervision. CONTRAST:  None FLUOROSCOPY TIME:  2 minutes, 6 seconds (82.4 mGy) COMPLICATIONS: None immediate. PROCEDURE: The procedure, risks, benefits,  and alternatives were explained to the patient. Questions regarding the procedure were encouraged and answered. The patient understands and consents to the procedure. The right neck and chest were prepped with chlorhexidine in a sterile fashion, and a sterile drape was applied covering the operative field. Maximum barrier sterile technique with sterile gowns and gloves were used for the procedure. A timeout was performed prior to the initiation of the procedure. Local anesthesia was provided with 1% lidocaine with epinephrine. After creating a small venotomy incision, a micropuncture kit was utilized to access the internal jugular vein. Real-time ultrasound guidance was utilized for vascular access including the acquisition of a permanent ultrasound image documenting patency of the accessed vessel. The microwire was utilized to measure appropriate catheter length. A subcutaneous port pocket was then created along the upper chest wall utilizing a combination of sharp and blunt dissection. The pocket was irrigated with sterile saline. A single lumen Non-ISP power injectable port was chosen for  placement. The 8 Fr catheter was tunneled from the port pocket site to the venotomy incision. The port was placed in the pocket. The external catheter was trimmed to appropriate length. At the venotomy, an 8 Fr peel-away sheath was placed over a guidewire under fluoroscopic guidance. The catheter was then placed through the sheath and the sheath was removed. Final catheter positioning was confirmed and documented with a fluoroscopic spot radiograph. The port was accessed with a Huber needle, aspirated and flushed with heparinized saline. The venotomy site was closed with an interrupted 4-0 Vicryl suture. The port pocket incision was closed with interrupted 2-0 Vicryl suture and the skin was opposed with a running subcuticular 4-0 Vicryl suture. Dermabond and Steri-strips were applied to both incisions. Dressings were placed. The  patient tolerated the procedure well without immediate post procedural complication. FINDINGS: After catheter placement, the tip lies within the superior cavoatrial junction. The catheter aspirates and flushes normally and is ready for immediate use. IMPRESSION: Successful placement of a right internal jugular approach power injectable Port-A-Cath. The catheter is ready for immediate use. Electronically Signed   By: Sandi Mariscal M.D.   On: 02/16/2017 14:49    ASSESSMENT & PLAN:  Endometrial cancer ([place]) We reviewed the NCCN guidelines We discussed the role of chemotherapy. The intent is of palliative intent.  We discussed some of the risks, benefits, side-effects of carboplatin & Taxol. Treatment is intravenous, every 3 weeks x 6 cycles  Some of the short term side-effects included, though not limited to, including weight loss, life threatening infections, risk of allergic reactions, need for transfusions of blood products, nausea, vomiting, change in bowel habits, loss of hair, admission to hospital for various reasons, and risks of death.   Long term side-effects are also discussed including risks of infertility, permanent damage to nerve function, hearing loss, chronic fatigue, kidney damage with possibility needing hemodialysis, and rare secondary malignancy including bone marrow disorders.  The patient is aware that the response rates discussed earlier is not guaranteed.  After a long discussion, patient made an informed decision to proceed with the prescribed plan of care.   Patient education material was dispensed. We discussed premedication with dexamethasone before chemotherapy. 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 We discussed importance of Advanced Directives and Living will.   Orders Placed This Encounter  Procedures  . CBC with Differential    Standing Status:   Standing    Number of Occurrences:   20    Standing Expiration Date:   02/23/2018  . Comprehensive metabolic panel    Standing Status:   Standing    Number of Occurrences:   20    Standing Expiration Date:   02/23/2018  . TSH    Standing Status:  Future    Standing Expiration Date:   03/29/2018    All questions were answered. The patient knows to call the clinic with any problems, questions or concerns. I spent 45 minutes counseling the patient face to face. The total time spent in the appointment was 70 minutes and more than 50% was on counseling.     Heath Lark, MD 02/22/2017 11:54 AM

## 2017-02-22 NOTE — Assessment & Plan Note (Addendum)
Her calculated chemotherapy dosing is very high The patient had peripheral neuropathy from prior chemotherapy I will cap carboplatin at 750 mg and reduce the dose of Taxol a little bit

## 2017-02-25 ENCOUNTER — Ambulatory Visit: Payer: BLUE CROSS/BLUE SHIELD | Admitting: Hematology and Oncology

## 2017-02-26 ENCOUNTER — Telehealth: Payer: Self-pay

## 2017-02-26 NOTE — Telephone Encounter (Signed)
Patient called and left message. She went to pharmacy to pick up 2 Rx's and Emla cream. But she did not have any Rx's. Wondering when they will be e- scribed.

## 2017-03-02 ENCOUNTER — Other Ambulatory Visit: Payer: Self-pay | Admitting: Hematology and Oncology

## 2017-03-02 DIAGNOSIS — C569 Malignant neoplasm of unspecified ovary: Secondary | ICD-10-CM

## 2017-03-02 DIAGNOSIS — C541 Malignant neoplasm of endometrium: Secondary | ICD-10-CM

## 2017-03-02 MED ORDER — ONDANSETRON HCL 8 MG PO TABS: 8.0000 mg | ORAL_TABLET | Freq: Two times a day (BID) | ORAL | 1 refills | Status: DC | PRN

## 2017-03-02 MED ORDER — DEXAMETHASONE 4 MG PO TABS: 8.0000 mg | ORAL_TABLET | Freq: Every day | ORAL | 1 refills | Status: DC

## 2017-03-02 MED ORDER — LIDOCAINE-PRILOCAINE 2.5-2.5 % EX CREA: TOPICAL_CREAM | CUTANEOUS | 3 refills | Status: DC

## 2017-03-02 MED ORDER — DEXAMETHASONE 4 MG PO TABS
ORAL_TABLET | ORAL | 1 refills | Status: DC
Start: 2017-03-02 — End: 2017-07-23

## 2017-03-02 MED ORDER — PROCHLORPERAZINE MALEATE 10 MG PO TABS: 10.0000 mg | ORAL_TABLET | Freq: Four times a day (QID) | ORAL | 1 refills | Status: DC | PRN

## 2017-03-02 NOTE — Telephone Encounter (Signed)
I just released her prescriptions to her pharmacy. Clarify with pharmacist the correct dexamethasone is the 5 tabs before the day of chemo and 5 tabs the morning of chemo

## 2017-03-04 ENCOUNTER — Ambulatory Visit: Payer: BLUE CROSS/BLUE SHIELD

## 2017-03-04 ENCOUNTER — Other Ambulatory Visit (HOSPITAL_BASED_OUTPATIENT_CLINIC_OR_DEPARTMENT_OTHER): Payer: BLUE CROSS/BLUE SHIELD

## 2017-03-04 ENCOUNTER — Ambulatory Visit (HOSPITAL_BASED_OUTPATIENT_CLINIC_OR_DEPARTMENT_OTHER): Payer: BLUE CROSS/BLUE SHIELD

## 2017-03-04 ENCOUNTER — Other Ambulatory Visit: Payer: Self-pay | Admitting: Hematology and Oncology

## 2017-03-04 ENCOUNTER — Telehealth: Payer: Self-pay | Admitting: Hematology and Oncology

## 2017-03-04 VITALS — BP 172/82 | HR 74 | Temp 98.0°F | Resp 16

## 2017-03-04 DIAGNOSIS — C541 Malignant neoplasm of endometrium: Secondary | ICD-10-CM | POA: Diagnosis not present

## 2017-03-04 DIAGNOSIS — E041 Nontoxic single thyroid nodule: Secondary | ICD-10-CM

## 2017-03-04 DIAGNOSIS — Z5111 Encounter for antineoplastic chemotherapy: Secondary | ICD-10-CM | POA: Diagnosis not present

## 2017-03-04 DIAGNOSIS — Z23 Encounter for immunization: Secondary | ICD-10-CM | POA: Diagnosis not present

## 2017-03-04 DIAGNOSIS — C569 Malignant neoplasm of unspecified ovary: Secondary | ICD-10-CM

## 2017-03-04 LAB — CBC WITH DIFFERENTIAL/PLATELET
BASO%: 0.1 % (ref 0.0–2.0)
Basophils Absolute: 0 10*3/uL (ref 0.0–0.1)
EOS ABS: 0 10*3/uL (ref 0.0–0.5)
EOS%: 0 % (ref 0.0–7.0)
HCT: 35.3 % (ref 34.8–46.6)
HGB: 11.7 g/dL (ref 11.6–15.9)
LYMPH%: 6.6 % — AB (ref 14.0–49.7)
MCH: 30.5 pg (ref 25.1–34.0)
MCHC: 33.1 g/dL (ref 31.5–36.0)
MCV: 92.1 fL (ref 79.5–101.0)
MONO#: 0.3 10*3/uL (ref 0.1–0.9)
MONO%: 2.5 % (ref 0.0–14.0)
NEUT%: 90.8 % — ABNORMAL HIGH (ref 38.4–76.8)
NEUTROS ABS: 11.2 10*3/uL — AB (ref 1.5–6.5)
Platelets: 246 10*3/uL (ref 145–400)
RBC: 3.83 10*6/uL (ref 3.70–5.45)
RDW: 15.7 % — ABNORMAL HIGH (ref 11.2–14.5)
WBC: 12.4 10*3/uL — AB (ref 3.9–10.3)
lymph#: 0.8 10*3/uL — ABNORMAL LOW (ref 0.9–3.3)

## 2017-03-04 LAB — COMPREHENSIVE METABOLIC PANEL
ALBUMIN: 3.9 g/dL (ref 3.5–5.0)
ALK PHOS: 102 U/L (ref 40–150)
ALT: 18 U/L (ref 0–55)
ANION GAP: 11 meq/L (ref 3–11)
AST: 21 U/L (ref 5–34)
BILIRUBIN TOTAL: 0.62 mg/dL (ref 0.20–1.20)
BUN: 20.1 mg/dL (ref 7.0–26.0)
CALCIUM: 9.9 mg/dL (ref 8.4–10.4)
CO2: 23 mEq/L (ref 22–29)
CREATININE: 1.2 mg/dL — AB (ref 0.6–1.1)
Chloride: 103 mEq/L (ref 98–109)
EGFR: 59 mL/min/{1.73_m2} — ABNORMAL LOW (ref >60)
Glucose: 136 mg/dl (ref 70–140)
Potassium: 3.9 mEq/L (ref 3.5–5.1)
Sodium: 137 mEq/L (ref 136–145)
TOTAL PROTEIN: 9.8 g/dL — AB (ref 6.4–8.3)

## 2017-03-04 LAB — TSH: TSH: 0.471 m[IU]/L (ref 0.308–3.960)

## 2017-03-04 MED ORDER — SODIUM CHLORIDE 0.9 % IV SOLN
131.2500 mg/m2 | Freq: Once | INTRAVENOUS | Status: AC
  Administered 2017-03-04: 324 mg via INTRAVENOUS
  Filled 2017-03-04: qty 54

## 2017-03-04 MED ORDER — SODIUM CHLORIDE 0.9 % IV SOLN
Freq: Once | INTRAVENOUS | Status: AC
  Administered 2017-03-04: 13:00:00 via INTRAVENOUS

## 2017-03-04 MED ORDER — DIPHENHYDRAMINE HCL 50 MG/ML IJ SOLN
INTRAMUSCULAR | Status: AC
  Filled 2017-03-04: qty 1

## 2017-03-04 MED ORDER — SODIUM CHLORIDE 0.9% FLUSH
10.0000 mL | Freq: Once | INTRAVENOUS | Status: AC
  Administered 2017-03-04: 10 mL
  Filled 2017-03-04: qty 10

## 2017-03-04 MED ORDER — PALONOSETRON HCL INJECTION 0.25 MG/5ML
0.2500 mg | Freq: Once | INTRAVENOUS | Status: AC
  Administered 2017-03-04: 0.25 mg via INTRAVENOUS

## 2017-03-04 MED ORDER — PEGFILGRASTIM 6 MG/0.6ML ~~LOC~~ PSKT: 6.0000 mg | PREFILLED_SYRINGE | Freq: Once | SUBCUTANEOUS | Status: DC

## 2017-03-04 MED ORDER — PALONOSETRON HCL INJECTION 0.25 MG/5ML
INTRAVENOUS | Status: AC
  Filled 2017-03-04: qty 5

## 2017-03-04 MED ORDER — INFLUENZA VAC SPLIT QUAD 0.5 ML IM SUSY
0.5000 mL | PREFILLED_SYRINGE | Freq: Once | INTRAMUSCULAR | Status: AC
  Administered 2017-03-04: 0.5 mL via INTRAMUSCULAR

## 2017-03-04 MED ORDER — FAMOTIDINE IN NACL 20-0.9 MG/50ML-% IV SOLN
INTRAVENOUS | Status: AC
  Filled 2017-03-04: qty 50

## 2017-03-04 MED ORDER — CARBOPLATIN CHEMO INJECTION 600 MG/60ML
750.0000 mg | Freq: Once | INTRAVENOUS | Status: AC
  Administered 2017-03-04: 750 mg via INTRAVENOUS
  Filled 2017-03-04: qty 75

## 2017-03-04 MED ORDER — SODIUM CHLORIDE 0.9 % IV SOLN
Freq: Once | INTRAVENOUS | Status: AC
  Administered 2017-03-04: 13:00:00 via INTRAVENOUS
  Filled 2017-03-04: qty 5

## 2017-03-04 MED ORDER — FAMOTIDINE IN NACL 20-0.9 MG/50ML-% IV SOLN
20.0000 mg | Freq: Once | INTRAVENOUS | Status: AC
Start: 2017-03-04 — End: 2017-03-04
  Administered 2017-03-04: 20 mg via INTRAVENOUS

## 2017-03-04 MED ORDER — DIPHENHYDRAMINE HCL 50 MG/ML IJ SOLN
50.0000 mg | Freq: Once | INTRAMUSCULAR | Status: AC
  Administered 2017-03-04: 50 mg via INTRAVENOUS

## 2017-03-04 MED ORDER — HEPARIN SOD (PORK) LOCK FLUSH 100 UNIT/ML IV SOLN
500.0000 [IU] | Freq: Once | INTRAVENOUS | Status: AC | PRN
  Administered 2017-03-04: 500 [IU]
  Filled 2017-03-04: qty 5

## 2017-03-04 MED ORDER — SODIUM CHLORIDE 0.9% FLUSH
10.0000 mL | INTRAVENOUS | Status: DC | PRN
  Administered 2017-03-04: 10 mL
  Filled 2017-03-04: qty 10

## 2017-03-04 NOTE — Patient Instructions (Signed)
[facility] Discharge Instructions for Patients Receiving Chemotherapy  Today you received the following chemotherapy agents Taxol and Carboplatin.   To help prevent nausea and vomiting after your treatment, we encourage you to take your nausea medication as directed.   If you develop nausea and vomiting that is not controlled by your nausea medication, call the clinic.   BELOW ARE SYMPTOMS THAT SHOULD BE REPORTED IMMEDIATELY:  *FEVER GREATER THAN 100.5 F  *CHILLS WITH OR WITHOUT FEVER  NAUSEA AND VOMITING THAT IS NOT CONTROLLED WITH YOUR NAUSEA MEDICATION  *UNUSUAL SHORTNESS OF BREATH  *UNUSUAL BRUISING OR BLEEDING  TENDERNESS IN MOUTH AND THROAT WITH OR WITHOUT PRESENCE OF ULCERS  *URINARY PROBLEMS  *BOWEL PROBLEMS  UNUSUAL RASH Items with * indicate a potential emergency and should be followed up as soon as possible.  Feel free to call the clinic should you have any questions or concerns. The clinic phone number is [phone].  Please show the CHEMO ALERT CARD at check-in to the Emergency Department and triage nurse.   Paclitaxel injection What is this medicine? PACLITAXEL (PAK li TAX el) is a chemotherapy drug. It targets fast dividing cells, like cancer cells, and causes these cells to die. This medicine is used to treat ovarian cancer, breast cancer, and other cancers. This medicine may be used for other purposes; ask your health care provider or pharmacist if you have questions. COMMON BRAND NAME(S): Onxol, Taxol What should I tell my health care provider before I take this medicine? They need to know if you have any of these conditions: -blood disorders -irregular heartbeat -infection (especially a virus infection such as chickenpox, cold sores, or herpes) -liver disease -previous or ongoing radiation therapy -an unusual or allergic reaction to paclitaxel, alcohol, polyoxyethylated castor oil, other chemotherapy agents, other medicines, foods,  dyes, or preservatives -pregnant or trying to get pregnant -breast-feeding How should I use this medicine? This drug is given as an infusion into a vein. It is administered in a hospital or clinic by a specially trained health care professional. Talk to your pediatrician regarding the use of this medicine in children. Special care may be needed. Overdosage: If you think you have taken too much of this medicine contact a poison control center or emergency room at once. NOTE: This medicine is only for you. Do not share this medicine with others. What if I miss a dose? It is important not to miss your dose. Call your doctor or health care professional if you are unable to keep an appointment. What may interact with this medicine? Do not take this medicine with any of the following medications: -disulfiram -metronidazole This medicine may also interact with the following medications: -cyclosporine -diazepam -ketoconazole -medicines to increase blood counts like filgrastim, pegfilgrastim, sargramostim -other chemotherapy drugs like cisplatin, doxorubicin, epirubicin, etoposide, teniposide, vincristine -quinidine -testosterone -vaccines -verapamil Talk to your doctor or health care professional before taking any of these medicines: -acetaminophen -aspirin -ibuprofen -ketoprofen -naproxen This list may not describe all possible interactions. Give your health care provider a list of all the medicines, herbs, non-prescription drugs, or dietary supplements you use. Also tell them if you smoke, drink alcohol, or use illegal drugs. Some items may interact with your medicine. What should I watch for while using this medicine? Your condition will be monitored carefully while you are receiving this medicine. You will need important blood work done while you are taking this medicine. This medicine can cause serious allergic reactions. To reduce your risk you   will need to take other medicine(s)  before treatment with this medicine. If you experience allergic reactions like skin rash, itching or hives, swelling of the face, lips, or tongue, tell your doctor or health care professional right away. In some cases, you may be given additional medicines to help with side effects. Follow all directions for their use. This drug may make you feel generally unwell. This is not uncommon, as chemotherapy can affect healthy cells as well as cancer cells. Report any side effects. Continue your course of treatment even though you feel ill unless your doctor tells you to stop. Call your doctor or health care professional for advice if you get a fever, chills or sore throat, or other symptoms of a cold or flu. Do not treat yourself. This drug decreases your body's ability to fight infections. Try to avoid being around people who are sick. This medicine may increase your risk to bruise or bleed. Call your doctor or health care professional if you notice any unusual bleeding. Be careful brushing and flossing your teeth or using a toothpick because you may get an infection or bleed more easily. If you have any dental work done, tell your dentist you are receiving this medicine. Avoid taking products that contain aspirin, acetaminophen, ibuprofen, naproxen, or ketoprofen unless instructed by your doctor. These medicines may hide a fever. Do not become pregnant while taking this medicine. Women should inform their doctor if they wish to become pregnant or think they might be pregnant. There is a potential for serious side effects to an unborn child. Talk to your health care professional or pharmacist for more information. Do not breast-feed an infant while taking this medicine. Men are advised not to father a child while receiving this medicine. This product may contain alcohol. Ask your pharmacist or healthcare provider if this medicine contains alcohol. Be sure to tell all healthcare providers you are taking this  medicine. Certain medicines, like metronidazole and disulfiram, can cause an unpleasant reaction when taken with alcohol. The reaction includes flushing, headache, nausea, vomiting, sweating, and increased thirst. The reaction can last from 30 minutes to several hours. What side effects may I notice from receiving this medicine? Side effects that you should report to your doctor or health care professional as soon as possible: -allergic reactions like skin rash, itching or hives, swelling of the face, lips, or tongue -low blood counts - This drug may decrease the number of white blood cells, red blood cells and platelets. You may be at increased risk for infections and bleeding. -signs of infection - fever or chills, cough, sore throat, pain or difficulty passing urine -signs of decreased platelets or bleeding - bruising, pinpoint red spots on the skin, black, tarry stools, nosebleeds -signs of decreased red blood cells - unusually weak or tired, fainting spells, lightheadedness -breathing problems -chest pain -high or low blood pressure -mouth sores -nausea and vomiting -pain, swelling, redness or irritation at the injection site -pain, tingling, numbness in the hands or feet -slow or irregular heartbeat -swelling of the ankle, feet, hands Side effects that usually do not require medical attention (report to your doctor or health care professional if they continue or are bothersome): -bone pain -complete hair loss including hair on your head, underarms, pubic hair, eyebrows, and eyelashes -changes in the color of fingernails -diarrhea -loosening of the fingernails -loss of appetite -muscle or joint pain -red flush to skin -sweating This list may not describe all possible side effects. Call your doctor for   medical advice about side effects. You may report side effects to FDA at 1-800-FDA-1088. Where should I keep my medicine? This drug is given in a hospital or clinic and will not be  stored at home. NOTE: This sheet is a summary. It may not cover all possible information. If you have questions about this medicine, talk to your doctor, pharmacist, or health care provider.  2018 Elsevier/Gold Standard (2015-01-08 19:58:00)   Carboplatin injection What is this medicine? CARBOPLATIN (KAR boe pla tin) is a chemotherapy drug. It targets fast dividing cells, like cancer cells, and causes these cells to die. This medicine is used to treat ovarian cancer and many other cancers. This medicine may be used for other purposes; ask your health care provider or pharmacist if you have questions. COMMON BRAND NAME(S): Paraplatin What should I tell my health care provider before I take this medicine? They need to know if you have any of these conditions: -blood disorders -hearing problems -kidney disease -recent or ongoing radiation therapy -an unusual or allergic reaction to carboplatin, cisplatin, other chemotherapy, other medicines, foods, dyes, or preservatives -pregnant or trying to get pregnant -breast-feeding How should I use this medicine? This drug is usually given as an infusion into a vein. It is administered in a hospital or clinic by a specially trained health care professional. Talk to your pediatrician regarding the use of this medicine in children. Special care may be needed. Overdosage: If you think you have taken too much of this medicine contact a poison control center or emergency room at once. NOTE: This medicine is only for you. Do not share this medicine with others. What if I miss a dose? It is important not to miss a dose. Call your doctor or health care professional if you are unable to keep an appointment. What may interact with this medicine? -medicines for seizures -medicines to increase blood counts like filgrastim, pegfilgrastim, sargramostim -some antibiotics like amikacin, gentamicin, neomycin, streptomycin, tobramycin -vaccines Talk to your doctor  or health care professional before taking any of these medicines: -acetaminophen -aspirin -ibuprofen -ketoprofen -naproxen This list may not describe all possible interactions. Give your health care provider a list of all the medicines, herbs, non-prescription drugs, or dietary supplements you use. Also tell them if you smoke, drink alcohol, or use illegal drugs. Some items may interact with your medicine. What should I watch for while using this medicine? Your condition will be monitored carefully while you are receiving this medicine. You will need important blood work done while you are taking this medicine. This drug may make you feel generally unwell. This is not uncommon, as chemotherapy can affect healthy cells as well as cancer cells. Report any side effects. Continue your course of treatment even though you feel ill unless your doctor tells you to stop. In some cases, you may be given additional medicines to help with side effects. Follow all directions for their use. Call your doctor or health care professional for advice if you get a fever, chills or sore throat, or other symptoms of a cold or flu. Do not treat yourself. This drug decreases your body's ability to fight infections. Try to avoid being around people who are sick. This medicine may increase your risk to bruise or bleed. Call your doctor or health care professional if you notice any unusual bleeding. Be careful brushing and flossing your teeth or using a toothpick because you may get an infection or bleed more easily. If you have any dental work done,   tell your dentist you are receiving this medicine. Avoid taking products that contain aspirin, acetaminophen, ibuprofen, naproxen, or ketoprofen unless instructed by your doctor. These medicines may hide a fever. Do not become pregnant while taking this medicine. Women should inform their doctor if they wish to become pregnant or think they might be pregnant. There is a potential  for serious side effects to an unborn child. Talk to your health care professional or pharmacist for more information. Do not breast-feed an infant while taking this medicine. What side effects may I notice from receiving this medicine? Side effects that you should report to your doctor or health care professional as soon as possible: -allergic reactions like skin rash, itching or hives, swelling of the face, lips, or tongue -signs of infection - fever or chills, cough, sore throat, pain or difficulty passing urine -signs of decreased platelets or bleeding - bruising, pinpoint red spots on the skin, black, tarry stools, nosebleeds -signs of decreased red blood cells - unusually weak or tired, fainting spells, lightheadedness -breathing problems -changes in hearing -changes in vision -chest pain -high blood pressure -low blood counts - This drug may decrease the number of white blood cells, red blood cells and platelets. You may be at increased risk for infections and bleeding. -nausea and vomiting -pain, swelling, redness or irritation at the injection site -pain, tingling, numbness in the hands or feet -problems with balance, talking, walking -trouble passing urine or change in the amount of urine Side effects that usually do not require medical attention (report to your doctor or health care professional if they continue or are bothersome): -hair loss -loss of appetite -metallic taste in the mouth or changes in taste This list may not describe all possible side effects. Call your doctor for medical advice about side effects. You may report side effects to FDA at 1-800-FDA-1088. Where should I keep my medicine? This drug is given in a hospital or clinic and will not be stored at home. NOTE: This sheet is a summary. It may not cover all possible information. If you have questions about this medicine, talk to your doctor, pharmacist, or health care provider.  2018 Elsevier/Gold Standard  (2007-06-14 14:38:05)  

## 2017-03-04 NOTE — Telephone Encounter (Signed)
Scheduled appt per 12/13 sch message - patient is aware - calender given to pt's sister.

## 2017-03-05 ENCOUNTER — Telehealth: Payer: Self-pay | Admitting: *Deleted

## 2017-03-05 ENCOUNTER — Other Ambulatory Visit: Payer: Self-pay | Admitting: Hematology and Oncology

## 2017-03-05 NOTE — Telephone Encounter (Signed)
LM for patient explaining neulasta and reason Dr Alvy Bimler did not prescribe at this point. LM to call if she has questions

## 2017-03-06 ENCOUNTER — Ambulatory Visit: Payer: BLUE CROSS/BLUE SHIELD

## 2017-03-08 ENCOUNTER — Telehealth: Payer: Self-pay | Admitting: *Deleted

## 2017-03-08 MED ORDER — TRAMADOL HCL 50 MG PO TABS: 50.0000 mg | ORAL_TABLET | Freq: Four times a day (QID) | ORAL | 1 refills | Status: DC | PRN

## 2017-03-08 MED ORDER — GABAPENTIN 300 MG PO CAPS: 300.0000 mg | ORAL_CAPSULE | Freq: Two times a day (BID) | ORAL | 1 refills | Status: DC

## 2017-03-08 NOTE — Telephone Encounter (Signed)
Pt had called [facility]. States she started chemo on Thursday but did not have anything for pain. Explained that we do not routinely prescribe pain medications with the start of chemo.    Reports that her legs are achy and she "walked the floor all night". Took extra strength tylenol. Reports some numbness in feet- had it before with previous chemo.

## 2017-03-08 NOTE — Telephone Encounter (Signed)
Not my patient, Alvy Bimler patient

## 2017-03-08 NOTE — Telephone Encounter (Signed)
Called patient to let her know Dr Alvy Bimler is prescribing gabapentin 300 mg twice a day for her neuropathy- states she has taken in past and is familiar with it. Also prescribed tramadol 50 mg every 6 hours as needed for pain. Verbalized understanding

## 2017-03-11 ENCOUNTER — Ambulatory Visit: Payer: BLUE CROSS/BLUE SHIELD | Admitting: Gynecologic Oncology

## 2017-03-11 ENCOUNTER — Other Ambulatory Visit (HOSPITAL_BASED_OUTPATIENT_CLINIC_OR_DEPARTMENT_OTHER): Payer: BLUE CROSS/BLUE SHIELD

## 2017-03-11 DIAGNOSIS — C569 Malignant neoplasm of unspecified ovary: Secondary | ICD-10-CM

## 2017-03-11 DIAGNOSIS — C541 Malignant neoplasm of endometrium: Secondary | ICD-10-CM | POA: Diagnosis not present

## 2017-03-11 LAB — CBC WITH DIFFERENTIAL/PLATELET
BASO%: 0.3 % (ref 0.0–2.0)
BASOS ABS: 0 10*3/uL (ref 0.0–0.1)
EOS ABS: 0 10*3/uL (ref 0.0–0.5)
EOS%: 0.8 % (ref 0.0–7.0)
HCT: 32.9 % — ABNORMAL LOW (ref 34.8–46.6)
HEMOGLOBIN: 10.9 g/dL — AB (ref 11.6–15.9)
LYMPH%: 18.7 % (ref 14.0–49.7)
MCH: 30.6 pg (ref 25.1–34.0)
MCHC: 33.2 g/dL (ref 31.5–36.0)
MCV: 92.1 fL (ref 79.5–101.0)
MONO#: 0.1 10*3/uL (ref 0.1–0.9)
MONO%: 3.6 % (ref 0.0–14.0)
NEUT#: 1.9 10*3/uL (ref 1.5–6.5)
NEUT%: 76.6 % (ref 38.4–76.8)
Platelets: 171 10*3/uL (ref 145–400)
RBC: 3.57 10*6/uL — AB (ref 3.70–5.45)
RDW: 14.9 % — AB (ref 11.2–14.5)
WBC: 2.5 10*3/uL — ABNORMAL LOW (ref 3.9–10.3)
lymph#: 0.5 10*3/uL — ABNORMAL LOW (ref 0.9–3.3)

## 2017-03-11 LAB — COMPREHENSIVE METABOLIC PANEL
ALT: 21 U/L (ref 0–55)
ANION GAP: 9 meq/L (ref 3–11)
AST: 22 U/L (ref 5–34)
Albumin: 3.7 g/dL (ref 3.5–5.0)
Alkaline Phosphatase: 108 U/L (ref 40–150)
BILIRUBIN TOTAL: 0.73 mg/dL (ref 0.20–1.20)
BUN: 18 mg/dL (ref 7.0–26.0)
CALCIUM: 8.9 mg/dL (ref 8.4–10.4)
CO2: 26 meq/L (ref 22–29)
CREATININE: 1 mg/dL (ref 0.6–1.1)
Chloride: 103 mEq/L (ref 98–109)
EGFR: >60 mL/min/{1.73_m2} (ref >60)
Glucose: 113 mg/dl (ref 70–140)
Potassium: 3.7 mEq/L (ref 3.5–5.1)
Sodium: 138 mEq/L (ref 136–145)
TOTAL PROTEIN: 8.6 g/dL — AB (ref 6.4–8.3)

## 2017-03-18 ENCOUNTER — Other Ambulatory Visit: Payer: BLUE CROSS/BLUE SHIELD

## 2017-03-25 ENCOUNTER — Ambulatory Visit (HOSPITAL_BASED_OUTPATIENT_CLINIC_OR_DEPARTMENT_OTHER): Payer: BLUE CROSS/BLUE SHIELD | Admitting: Hematology and Oncology

## 2017-03-25 ENCOUNTER — Ambulatory Visit: Payer: BLUE CROSS/BLUE SHIELD

## 2017-03-25 ENCOUNTER — Other Ambulatory Visit (HOSPITAL_BASED_OUTPATIENT_CLINIC_OR_DEPARTMENT_OTHER): Payer: BLUE CROSS/BLUE SHIELD

## 2017-03-25 ENCOUNTER — Ambulatory Visit (HOSPITAL_BASED_OUTPATIENT_CLINIC_OR_DEPARTMENT_OTHER): Payer: BLUE CROSS/BLUE SHIELD

## 2017-03-25 ENCOUNTER — Telehealth: Payer: Self-pay | Admitting: Hematology and Oncology

## 2017-03-25 ENCOUNTER — Encounter: Payer: Self-pay | Admitting: Hematology and Oncology

## 2017-03-25 DIAGNOSIS — C541 Malignant neoplasm of endometrium: Secondary | ICD-10-CM

## 2017-03-25 DIAGNOSIS — G620 Drug-induced polyneuropathy: Secondary | ICD-10-CM

## 2017-03-25 DIAGNOSIS — K1231 Oral mucositis (ulcerative) due to antineoplastic therapy: Secondary | ICD-10-CM | POA: Insufficient documentation

## 2017-03-25 DIAGNOSIS — R110 Nausea: Secondary | ICD-10-CM | POA: Insufficient documentation

## 2017-03-25 DIAGNOSIS — C569 Malignant neoplasm of unspecified ovary: Secondary | ICD-10-CM

## 2017-03-25 DIAGNOSIS — T451X5A Adverse effect of antineoplastic and immunosuppressive drugs, initial encounter: Secondary | ICD-10-CM

## 2017-03-25 DIAGNOSIS — Z5111 Encounter for antineoplastic chemotherapy: Secondary | ICD-10-CM

## 2017-03-25 DIAGNOSIS — I10 Essential (primary) hypertension: Secondary | ICD-10-CM

## 2017-03-25 LAB — COMPREHENSIVE METABOLIC PANEL
ALT: 24 U/L (ref 0–55)
AST: 22 U/L (ref 5–34)
Albumin: 3.6 g/dL (ref 3.5–5.0)
Alkaline Phosphatase: 100 U/L (ref 40–150)
Anion Gap: 10 mEq/L (ref 3–11)
BUN: 17.3 mg/dL (ref 7.0–26.0)
CHLORIDE: 101 meq/L (ref 98–109)
CO2: 25 mEq/L (ref 22–29)
CREATININE: 1.1 mg/dL (ref 0.6–1.1)
Calcium: 9.4 mg/dL (ref 8.4–10.4)
EGFR: >60 mL/min/{1.73_m2} (ref >60)
GLUCOSE: 180 mg/dL — AB (ref 70–140)
Potassium: 4.1 mEq/L (ref 3.5–5.1)
Sodium: 137 mEq/L (ref 136–145)
Total Bilirubin: 0.37 mg/dL (ref 0.20–1.20)
Total Protein: 9.1 g/dL — ABNORMAL HIGH (ref 6.4–8.3)

## 2017-03-25 LAB — CBC WITH DIFFERENTIAL/PLATELET
BASO%: 0 % (ref 0.0–2.0)
BASOS ABS: 0 10*3/uL (ref 0.0–0.1)
EOS ABS: 0 10*3/uL (ref 0.0–0.5)
EOS%: 0 % (ref 0.0–7.0)
HCT: 33.2 % — ABNORMAL LOW (ref 34.8–46.6)
HEMOGLOBIN: 11 g/dL — AB (ref 11.6–15.9)
LYMPH%: 7.3 % — AB (ref 14.0–49.7)
MCH: 30.9 pg (ref 25.1–34.0)
MCHC: 33.1 g/dL (ref 31.5–36.0)
MCV: 93.3 fL (ref 79.5–101.0)
MONO#: 0.2 10*3/uL (ref 0.1–0.9)
MONO%: 2.7 % (ref 0.0–14.0)
NEUT#: 7.6 10*3/uL — ABNORMAL HIGH (ref 1.5–6.5)
NEUT%: 90 % — AB (ref 38.4–76.8)
Platelets: 191 10*3/uL (ref 145–400)
RBC: 3.56 10*6/uL — AB (ref 3.70–5.45)
RDW: 14.7 % — ABNORMAL HIGH (ref 11.2–14.5)
WBC: 8.5 10*3/uL (ref 3.9–10.3)
lymph#: 0.6 10*3/uL — ABNORMAL LOW (ref 0.9–3.3)

## 2017-03-25 MED ORDER — FAMOTIDINE IN NACL 20-0.9 MG/50ML-% IV SOLN
20.0000 mg | Freq: Once | INTRAVENOUS | Status: AC
  Administered 2017-03-25: 20 mg via INTRAVENOUS

## 2017-03-25 MED ORDER — SODIUM CHLORIDE 0.9 % IV SOLN
698.4000 mg | Freq: Once | INTRAVENOUS | Status: AC
  Administered 2017-03-25: 700 mg via INTRAVENOUS
  Filled 2017-03-25: qty 70

## 2017-03-25 MED ORDER — DIPHENHYDRAMINE HCL 50 MG/ML IJ SOLN
INTRAMUSCULAR | Status: AC
  Filled 2017-03-25: qty 1

## 2017-03-25 MED ORDER — SODIUM CHLORIDE 0.9 % IV SOLN
105.0000 mg/m2 | Freq: Once | INTRAVENOUS | Status: AC
  Administered 2017-03-25: 258 mg via INTRAVENOUS
  Filled 2017-03-25: qty 43

## 2017-03-25 MED ORDER — FAMOTIDINE IN NACL 20-0.9 MG/50ML-% IV SOLN
INTRAVENOUS | Status: AC
  Filled 2017-03-25: qty 50

## 2017-03-25 MED ORDER — MAGIC MOUTHWASH W/LIDOCAINE: 5.0000 mL | Freq: Four times a day (QID) | ORAL | 0 refills | Status: DC | PRN

## 2017-03-25 MED ORDER — DIPHENHYDRAMINE HCL 50 MG/ML IJ SOLN
50.0000 mg | Freq: Once | INTRAMUSCULAR | Status: AC
Start: 2017-03-25 — End: 2017-03-25
  Administered 2017-03-25: 50 mg via INTRAVENOUS

## 2017-03-25 MED ORDER — SODIUM CHLORIDE 0.9 % IV SOLN
Freq: Once | INTRAVENOUS | Status: AC
  Administered 2017-03-25: 12:00:00 via INTRAVENOUS
  Filled 2017-03-25: qty 5

## 2017-03-25 MED ORDER — PALONOSETRON HCL INJECTION 0.25 MG/5ML
INTRAVENOUS | Status: AC
Start: 2017-03-25 — End: ?
  Filled 2017-03-25: qty 5

## 2017-03-25 MED ORDER — SODIUM CHLORIDE 0.9% FLUSH
10.0000 mL | Freq: Once | INTRAVENOUS | Status: AC
  Administered 2017-03-25: 10 mL
  Filled 2017-03-25: qty 10

## 2017-03-25 MED ORDER — HEPARIN SOD (PORK) LOCK FLUSH 100 UNIT/ML IV SOLN
500.0000 [IU] | Freq: Once | INTRAVENOUS | Status: AC | PRN
  Administered 2017-03-25: 500 [IU]
  Filled 2017-03-25: qty 5

## 2017-03-25 MED ORDER — SODIUM CHLORIDE 0.9% FLUSH
10.0000 mL | INTRAVENOUS | Status: DC | PRN
  Administered 2017-03-25: 10 mL
  Filled 2017-03-25: qty 10

## 2017-03-25 MED ORDER — PALONOSETRON HCL INJECTION 0.25 MG/5ML
0.2500 mg | Freq: Once | INTRAVENOUS | Status: AC
  Administered 2017-03-25: 0.25 mg via INTRAVENOUS

## 2017-03-25 NOTE — Assessment & Plan Note (Signed)
She has chemotherapy-induced nausea She also had mild mucositis We would treat her mucositis and I recommend antiemetics as tolerated needed

## 2017-03-25 NOTE — Progress Notes (Signed)
[place] OFFICE PROGRESS NOTE  Patient Care Team: Shirline Frees, MD as PCP - General (Family Medicine)  SUMMARY OF ONCOLOGIC HISTORY: Oncology History   Recurrent endometrial cancer, strong ER positive 90%, PR 90%, MSI stable Clear cell mixed features of ovaries in 2015 Recurrent disease 2018: low grade endometroid       Endometrial cancer ([place])   02/24/2013 Miscellaneous    Pulmonary embolus  IVC filter was placed and she was started on Lovenox.  She was subsequently transitioned to warfarin.       03/07/2013 Pathology Results    1. Cervix, biopsy - HIGH GRADE ENDOMETRIAL CARCINOMA, SEE COMMENT. 2. Endometrium, biopsy - HIGH GRADE ENDOMETRIAL CARCINOMA, SEE COMMENT. Microscopic Comment 1. , 2. In both parts 1 and 2, slide sections demonstrate extensive involvement by high grade endometrial carcinoma demonstrating a biphasic epithelial and stromal architecture. The immunophenotype (strong diffuse estrogen receptor; strong diffuse vimentin expression; patchy monoclonal CEA expression, patchy p16 immunostain expression, and diffuse mild to moderate p53 expression) is consistent with primary endometrial origin. 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       03/14/2013 - 01/04/2014 Chemotherapy    taxol carboplatin x 9      08/01/2013 Surgery    Procedure(s) Performed: 1. Exploratory laparotomy with total hysterectomy bilateral salpingo-oophorectomy, omental biopsies.  Surgeon: Francetta Found.  Skeet Latch, MD., PhD  Assistant Surgeon: Lahoma Crocker M.D.   Specimens: Uterus Cervix, Bilateral tubes / ovaries, lower  omentum.   Operative Findings: A 24cm left ovarian mass hemosiderine filled with omental adhesions.  Evidence of chemotherapeutic changes over the bowel.  Uterus 14 cm.  No palpable hepatic mets but the  examination was limited by diaphragmatic adhesions.  No gross disease in the omentum. TAH BSO omentectomy Left ovary 20cm clear cell malignancy  Uterus Carcinosarcoma  Multiple microscopic foci, measuring up to 0.5 cm in greatest dimension. Histologic type: Carcinosarcoma (MMMT) with heterologous component (ossification) Grade: High grade Myometrial invasion: 2.1 cm where myometrium is 2.5 cm in thickness Cervical stromal involvement: No.      08/01/2013 Pathology Results    1. Adnexa - ovary +/- tube, neoplastic, left with omentum - HIGH GRADE CARCINOMA WITH BOTH CLEAR CELL CARCINOMA AND ENDOMETRIOID CARCINOMA COMPONENTS, 20 CM. PLEASE SEE ONCOLOGY TEMPLATE FOR DETAIL. 2. Uterus and cervix, with right fallopian tube and right ovary - MICROSCOPIC FOCI OF RESIDUAL CARCINOSARCOMA WITH HETEROLOGOUS ELEMENT, INVOLVING OUTER HALF OF THE MYOMETRIUM. - EXTENSIVE ADENOMYOSIS AND LEIOMYOMA. - CERVIX: BENIGN SQUAMOUS MUCOSA AND ENDOCERVICAL MUCOSA, NO DYSPLASIA OR MALIGNANCY. - RIGHT OVARY AND FALLOPIAN TUBES: NO PATHOLOGIC ABNORMALITIES. Microscopic Comment  1. OVARY Specimen(s): Left ovary and fallopian tube. Procedure: (including lymph node sampling): Left salpingo-oophorectomy Primary tumor site (including laterality): Left ovary Ovarian surface involvement: Present. Ovarian capsule intact without fragmentation: No. Maximum tumor size (cm): 20 cm, gross measurement. Histologic type: Mixed surface epithelial carcinoma with predominant clear cell carcinoma component (70%) and minor endometrioid component (30%) Please see comment for detail. Grade: High grade Peritoneal implants: (specify invasive or non-invasive): N/A Pelvic extension (list additional structures on separate lines and if involved): No. Lymph nodes: number examined - N/A ; number positive - N/A TNM code: ypT1c2, pNX FIGO Stage (based on pathologic findings, needs clinical correlation): at least IC2 Comments: Received  grossly is a 20 cm focally disrupted and partially collapsed multicystic ovary with attached/adherent omentum. Microscopically, sectioned show an  invasive high grade surface carcinoma arising in a background of endometriotic cyst. The tumor is predominately composed of clear cell carcinoma component with minor endometrioid carcinoma component. No tumor involvement is identified in the adherent omentum tissue. Immunohistochemical stains were performed and the clear cell carcinoma is patchy positive for p16, ER, p53, negative for WT-1 and p63. Focal angiolymphatic invasion is present  The morphologic features are different from those seen in the uterus. 2. ONCOLOGY TABLE-UTERUS, CARCINOSARCOMA Specimen: Uterus, cervix and right fallopian tubes and ovaries. Procedure: Total hysterectomy and right salpingo-oophorectomy. Lymph node sampling performed: No. Specimen integrity: Intact. Maximum tumor size: Multiple microscopic foci, measuring up to 0.5 cm in greatest dimension. Histologic type: Carcinosarcoma (MMMT) with heterologous component (ossification) Grade: High grade Myometrial invasion: 2.1 cm where myometrium is 2.5 cm in thickness Cervical stromal involvement: No. Extent of involvement of other organs: No. Lymph - vascular invasion: Not identified. Peritoneal washings: N/A Lymph nodes: # examined N/A ; # positive N/A Pelvic lymph nodes: N/A involved of N/A lymph nodes. Para-aortic lymph nodes: N/A involved of N/A lymph nodes. Other (specify involvement and site): N/A TNM code: ypT1b, ypNX FIGO Stage (based on pathologic findings, needs clinical correlation): IB Comment: Immunohistochemical stains were performed and the residual carcinoma is positive for p16, negative for WT-1, weakly positive for p53 and ER. The morphologic features are different from those seen in the left ovary. (HCL:gt, 08/04/13)      02/21/2014 - 03/09/2014 Radiation Therapy    Received vaginal  brachytherapy TREATMENT DATES: 12/2, 12/7, 12/11, 12/14 and 03/09/14  SITE/DOSE: Vaginal Cuff 4 cm treatment length / 30 Gy in 5 fractions  Technique: HDR with Ir-192       12/17/2016 Relapse/Recurrence    2cm rubbery like mass in the RV septum on physical examination      12/23/2016 Imaging    Reproductive: Previous hysterectomy. New soft tissue mass seen between the vaginal cuff and anterior rectal wall which measures 2.7 x 2.6 cm on image 112/ 2. This is consistent with locally recurrent endometrial carcinoma.      01/05/2017 Imaging    1. Previously noted pelvic mass is similar in size and appearance to the prior examination from 12/23/2016 demonstrating some low-level hypermetabolism. This is nonspecific, and could represent a lesion of rectal, cervical or uterine origin. 2. No definite findings to suggest metastatic disease in the chest, abdomen or pelvis. 3. Small focus of hypermetabolism in the distal third of the esophagus without a perceivable abnormality on the CT portion of the examination. This could represent physiologic activity, a focal area of esophagitis, or an underlying lesion. Clinical correlation is recommended, with consideration for followup upper endoscopy if clinically appropriate. 4. Nonspecific low-level hypermetabolism in a left axillary lymph node which is stable in size and appearance compared to prior examinations. This is favored to be benign, but correlation with routine annual mammography is recommended. 5. Dilatation of the pulmonic trunk (4 cm in diameter), concerning for pulmonary arterial hypertension. 6. Severe hepatic steatosis.      01/05/2017 PET scan    1. Previously noted pelvic mass is similar in size and appearance to the prior examination from 12/23/2016 demonstrating some low-level hypermetabolism. This is nonspecific, and could represent a lesion of rectal,  cervical or uterine origin. 2. No definite findings to suggest metastatic disease in the chest, abdomen or pelvis. 3. Small focus of hypermetabolism in the distal third of the esophagus without a perceivable abnormality on the CT portion of the examination. This could  represent physiologic activity, a focal area of esophagitis, or an underlying lesion. Clinical correlation is recommended, with consideration for followup upper endoscopy if clinically appropriate. 4. Nonspecific low-level hypermetabolism in a left axillary lymph node which is stable in size and appearance compared to prior examinations. This is favored to be benign, but correlation with routine annual mammography is recommended. 5. Dilatation of the pulmonic trunk (4 cm in diameter), concerning for pulmonary arterial hypertension. 6. Severe hepatic steatosis. 7. Aortic atherosclerosis, in addition to left anterior descending coronary artery disease. Please note that although the presence of coronary artery calcium documents the presence of coronary artery disease, the severity of this disease and any potential stenosis cannot be assessed on this non-gated CT examination. Assessment for potential risk factor modification, dietary therapy or pharmacologic therapy may be warranted, if clinically indicated. 8. Additional incidental findings, as above. Aortic Atherosclerosis (ICD10-I70.0).      01/27/2017 Surgery    She underwent diagnostic laparoscopy, robotic assisted resection of intraperitoneal mass, infracolic omentectomy, peritoneal biopsies and rigid proctoscopy. R1 resection with miliary disease remaining in tumor bed      02/04/2017 Pathology Results    A: Cul-de-sac mass, biopsy  - Endometrioid adenocarcinoma, FIGO grade 1 (architecture grade 1, nuclear grade 2) (see comment)  B: Peritoneum, pelvic, biopsy  - Fragments of fibrovascular tissue with hemorrhage - No malignancy identified  C: Cul-de-sac, biopsy  -  Endometrioid adenocarcinoma, FIGO grade 1 (architecture grade 1, nuclear grade 2), with squamous differentiation (see comment)  D: Omentum, omentectomy  - Benign omentum  - No malignancy identified  The purpose of this addendum is to report the results of immunohistochemical stains for mismatch repair proteins and hormone receptors performed on a representative block of tumor.  Immunostains for mismatch repair proteins were performed on block C2 and show intact expression of MLH1, PMS2, MSH2, and MSH6 within the tumor. This is the normal phenotype and does NOT support a diagnosis of microsatellite instability/hereditary non-polyposis colorectal cancer (HNPCC) associated carcinoma. Molecular testing for additional microsatellite instability markers will be performed by the [employer] [phone]) and reported separately.  Estrogen receptor (Ventana, clone SP1):  Positive (90%, weak to moderate)  Progesterone receptor (Ventana, clone 1E2):  Positive (90%, strong)      02/16/2017 Procedure    Successful placement of a right internal jugular approach power injectable Port-A-Cath. The catheter is ready for immediate use.      03/04/2017 -  Chemotherapy    The patient had Carboplatin and Taxol for chemotherapy treatment.         Ovarian cancer ([place])   08/01/2013 Initial Diagnosis    Ovarian cancer ([place]) Unstaged.  Incidental pathology finding      2016 Genetic Testing     Results revealed patient has the following mutation(s): NEGATIVE        INTERVAL HISTORY: Please see below for problem oriented charting. She returns for cycle 2 of chemotherapy She did not tolerate cycle 1 of therapy well She has mild worsening neuropathy, nausea and some mucositis. Her blood pressure is grossly elevated today because of recent deaths in the family She denies significant changes in bowel habits She is quite sedated with increased dose of gabapentin She takes tramadol  which seems to help with her neuropathic pain She denies recent infection  REVIEW OF SYSTEMS:   Constitutional: Denies fevers, chills or abnormal weight loss Eyes: Denies blurriness of vision Respiratory: Denies cough, dyspnea or wheezes Cardiovascular: Denies palpitation, chest discomfort or lower extremity  swelling Skin: Denies abnormal skin rashes Lymphatics: Denies new lymphadenopathy or easy bruising Neurological:Denies numbness, tingling or new weaknesses Behavioral/Psych: Mood is stable, no new changes  All other systems were reviewed with the patient and are negative.  I have reviewed the past medical history, past surgical history, social history and family history with the patient and they are unchanged from previous note.  ALLERGIES:  has No Known Allergies.  MEDICATIONS:  Current Outpatient Medications  Medication Sig Dispense Refill  . aspirin EC 81 MG tablet Take 81 mg by mouth daily.    . cetirizine (ZYRTEC) 10 MG tablet Take 10 mg by mouth daily.    Marland Kitchen dexamethasone (DECADRON) 4 MG tablet Take 5 tablets the day before chemo and 5 tablets the morning of chemotherapy with food every 21 days 60 tablet 1  . gabapentin (NEURONTIN) 300 MG capsule Take 1 capsule (300 mg total) by mouth 2 (two) times daily. 60 capsule 1  . lidocaine-prilocaine (EMLA) cream Apply to affected area once 30 g 3  . magic mouthwash w/lidocaine SOLN Take 5 mLs by mouth 4 (four) times daily as needed for mouth pain. 400 mL 0  . ondansetron (ZOFRAN) 8 MG tablet Take 1 tablet (8 mg total) by mouth 2 (two) times daily as needed for refractory nausea / vomiting. Start on day 3 after chemo. 30 tablet 1  . prochlorperazine (COMPAZINE) 10 MG tablet Take 1 tablet (10 mg total) by mouth every 6 (six) hours as needed (Nausea or vomiting). 30 tablet 1  . traMADol (ULTRAM) 50 MG tablet Take 1 tablet (50 mg total) by mouth every 6 (six) hours as needed. 90 tablet 1   No current facility-administered medications for  this visit.    Facility-Administered Medications Ordered in Other Visits  Medication Dose Route Frequency Provider Last Rate Last Dose  . CARBOplatin (PARAPLATIN) 700 mg in sodium chloride 0.9 % 250 mL chemo infusion  700 mg Intravenous Once Alvy Bimler, Corina Stacy, MD      . heparin lock flush 100 unit/mL  500 Units Intracatheter Once PRN Alvy Bimler, Estoria Geary, MD      . PACLitaxel (TAXOL) 258 mg in sodium chloride 0.9 % 250 mL chemo infusion (> 54m/m2)  105 mg/m2 (Treatment Plan Recorded) Intravenous Once GAlvy Bimler Oneita Allmon, MD 98 mL/hr at 03/25/17 1314 258 mg at 03/25/17 1314  . sodium chloride flush (NS) 0.9 % injection 10 mL  10 mL Intracatheter PRN GAlvy Bimler Chaise Passarella, MD        PHYSICAL EXAMINATION: ECOG PERFORMANCE STATUS: 1 - Symptomatic but completely ambulatory  Vitals:   03/25/17 1054  BP: (!) 186/82  Pulse: 93  Resp: 18  Temp: 98.4 F (36.9 C)  SpO2: 100%   Filed Weights   03/25/17 1054  Weight: 285 lb 9.6 oz (129.5 kg)    GENERAL:alert, no distress and comfortable SKIN: skin color, texture, turgor are normal, no rashes or significant lesions EYES: normal, Conjunctiva are pink and non-injected, sclera clear OROPHARYNX:no exudate, no erythema and lips, buccal mucosa, and tongue normal  NECK: supple, thyroid normal size, non-tender, without nodularity LYMPH:  no palpable lymphadenopathy in the cervical, axillary or inguinal LUNGS: clear to auscultation and percussion with normal breathing effort HEART: regular rate & rhythm and no murmurs and no lower extremity edema ABDOMEN:abdomen soft, non-tender and normal bowel sounds Musculoskeletal:no cyanosis of digits and no clubbing  NEURO: alert & oriented x 3 with fluent speech, no focal motor/sensory deficits  LABORATORY DATA:  I have reviewed the data as listed  Component Value Date/Time   NA 137 03/25/2017 0956   K 4.1 03/25/2017 0956   CL 102 02/16/2017 1209   CO2 25 03/25/2017 0956   GLUCOSE 180 (H) 03/25/2017 0956   BUN 17.3 03/25/2017 0956    CREATININE 1.1 03/25/2017 0956   CALCIUM 9.4 03/25/2017 0956   PROT 9.1 (H) 03/25/2017 0956   ALBUMIN 3.6 03/25/2017 0956   AST 22 03/25/2017 0956   ALT 24 03/25/2017 0956   ALKPHOS 100 03/25/2017 0956   BILITOT 0.37 03/25/2017 0956   GFRNONAA 53 (L) 02/16/2017 1209   GFRAA >60 02/16/2017 1209    No results found for: SPEP, UPEP  Lab Results  Component Value Date   WBC 8.5 03/25/2017   NEUTROABS 7.6 (H) 03/25/2017   HGB 11.0 (L) 03/25/2017   HCT 33.2 (L) 03/25/2017   MCV 93.3 03/25/2017   PLT 191 03/25/2017      Chemistry      Component Value Date/Time   NA 137 03/25/2017 0956   K 4.1 03/25/2017 0956   CL 102 02/16/2017 1209   CO2 25 03/25/2017 0956   BUN 17.3 03/25/2017 0956   CREATININE 1.1 03/25/2017 0956      Component Value Date/Time   CALCIUM 9.4 03/25/2017 0956   ALKPHOS 100 03/25/2017 0956   AST 22 03/25/2017 0956   ALT 24 03/25/2017 0956   BILITOT 0.37 03/25/2017 0956       ASSESSMENT & PLAN:  Endometrial cancer ([place]) She tolerated cycle 1 of chemotherapy poorly due to peripheral neuropathy I will further reduce the dose of Taxol to 50% I told her that the dose adjustment will not compromised to benefit of her treatment We need to preserve her neurological function Due to her morbid obesity, I will also reduce the dose of carboplatin  Mucositis due to antineoplastic therapy She has mild mucositis Hopefully, with minor dose adjustment, that would help I also recommend Magic mouthwash  Chemotherapy-induced neuropathy ([place]) She has worsening neuropathy but is not able to tolerate gabapentin at higher dose Recommend reducing the dose of carboplatin and Taxol and she agreed  Chemotherapy-induced nausea She has chemotherapy-induced nausea She also had mild mucositis We would treat her mucositis and I recommend antiemetics as tolerated needed  HTN (hypertension) Her blood pressure is significantly elevated but the patient attributed that to  anxiety We will monitor her blood pressure carefully    No orders of the defined types were placed in this encounter.  All questions were answered. The patient knows to call the clinic with any problems, questions or concerns. No barriers to learning was detected. I spent 25 minutes counseling the patient face to face. The total time spent in the appointment was 30 minutes and more than 50% was on counseling and review of test results     Heath Lark, MD 03/25/2017 1:28 PM

## 2017-03-25 NOTE — Assessment & Plan Note (Signed)
She has worsening neuropathy but is not able to tolerate gabapentin at higher dose Recommend reducing the dose of carboplatin and Taxol and she agreed

## 2017-03-25 NOTE — Assessment & Plan Note (Signed)
She has mild mucositis Hopefully, with minor dose adjustment, that would help I also recommend Magic mouthwash

## 2017-03-25 NOTE — Patient Instructions (Signed)
[facility] Discharge Instructions for Patients Receiving Chemotherapy  Today you received the following chemotherapy agents Paclitaxel; Carboplatin  To help prevent nausea and vomiting after your treatment, we encourage you to take your nausea medication as directed   If you develop nausea and vomiting that is not controlled by your nausea medication, call the clinic.   BELOW ARE SYMPTOMS THAT SHOULD BE REPORTED IMMEDIATELY:  *FEVER GREATER THAN 100.5 F  *CHILLS WITH OR WITHOUT FEVER  NAUSEA AND VOMITING THAT IS NOT CONTROLLED WITH YOUR NAUSEA MEDICATION  *UNUSUAL SHORTNESS OF BREATH  *UNUSUAL BRUISING OR BLEEDING  TENDERNESS IN MOUTH AND THROAT WITH OR WITHOUT PRESENCE OF ULCERS  *URINARY PROBLEMS  *BOWEL PROBLEMS  UNUSUAL RASH Items with * indicate a potential emergency and should be followed up as soon as possible.  Feel free to call the clinic should you have any questions or concerns. The clinic phone number is [phone].  Please show the CHEMO ALERT CARD at check-in to the Emergency Department and triage nurse.   

## 2017-03-25 NOTE — Telephone Encounter (Signed)
Gave patient AVs and calendar of upcoming January and February appointments.  °

## 2017-03-25 NOTE — Assessment & Plan Note (Signed)
Her blood pressure is significantly elevated but the patient attributed that to anxiety We will monitor her blood pressure carefully

## 2017-03-25 NOTE — Assessment & Plan Note (Signed)
She tolerated cycle 1 of chemotherapy poorly due to peripheral neuropathy I will further reduce the dose of Taxol to 50% I told her that the dose adjustment will not compromised to benefit of her treatment We need to preserve her neurological function Due to her morbid obesity, I will also reduce the dose of carboplatin

## 2017-03-27 ENCOUNTER — Ambulatory Visit: Payer: BLUE CROSS/BLUE SHIELD

## 2017-03-30 ENCOUNTER — Telehealth: Payer: Self-pay | Admitting: *Deleted

## 2017-03-30 NOTE — Telephone Encounter (Signed)
Called and spoke with the patient, cancelled appts with both Dr. Lanell Persons and Dr. Skeet Latch per Dr. Alvy Bimler. Informed the patient "Dr. Alvy Bimler will follow you until after chemo, then you will start to follow back up with them."

## 2017-04-02 ENCOUNTER — Inpatient Hospital Stay: Admission: RE | Admit: 2017-04-02 | Payer: Self-pay | Source: Ambulatory Visit | Admitting: Radiation Oncology

## 2017-04-15 ENCOUNTER — Inpatient Hospital Stay: Payer: BLUE CROSS/BLUE SHIELD | Admitting: Hematology and Oncology

## 2017-04-15 ENCOUNTER — Inpatient Hospital Stay: Payer: BLUE CROSS/BLUE SHIELD

## 2017-04-15 ENCOUNTER — Inpatient Hospital Stay: Payer: BLUE CROSS/BLUE SHIELD | Attending: Hematology and Oncology

## 2017-04-15 ENCOUNTER — Encounter: Payer: Self-pay | Admitting: Hematology and Oncology

## 2017-04-15 VITALS — HR 97

## 2017-04-15 DIAGNOSIS — G629 Polyneuropathy, unspecified: Secondary | ICD-10-CM | POA: Insufficient documentation

## 2017-04-15 DIAGNOSIS — T451X5A Adverse effect of antineoplastic and immunosuppressive drugs, initial encounter: Secondary | ICD-10-CM

## 2017-04-15 DIAGNOSIS — Z5111 Encounter for antineoplastic chemotherapy: Secondary | ICD-10-CM | POA: Diagnosis not present

## 2017-04-15 DIAGNOSIS — C541 Malignant neoplasm of endometrium: Secondary | ICD-10-CM | POA: Insufficient documentation

## 2017-04-15 DIAGNOSIS — R110 Nausea: Secondary | ICD-10-CM

## 2017-04-15 DIAGNOSIS — Z79899 Other long term (current) drug therapy: Secondary | ICD-10-CM

## 2017-04-15 DIAGNOSIS — C569 Malignant neoplasm of unspecified ovary: Secondary | ICD-10-CM

## 2017-04-15 DIAGNOSIS — G620 Drug-induced polyneuropathy: Secondary | ICD-10-CM

## 2017-04-15 LAB — COMPREHENSIVE METABOLIC PANEL
ALK PHOS: 102 U/L (ref 40–150)
ALT: 32 U/L (ref 0–55)
AST: 33 U/L (ref 5–34)
Albumin: 3.7 g/dL (ref 3.5–5.0)
Anion gap: 10 (ref 3–11)
BILIRUBIN TOTAL: 0.5 mg/dL (ref 0.2–1.2)
BUN: 20 mg/dL (ref 7–26)
CHLORIDE: 104 mmol/L (ref 98–109)
CO2: 23 mmol/L (ref 22–29)
CREATININE: 1.17 mg/dL — AB (ref 0.60–1.10)
Calcium: 9.7 mg/dL (ref 8.4–10.4)
GFR calc Af Amer: >60 mL/min (ref >60)
GFR, EST NON AFRICAN AMERICAN: 52 mL/min — AB (ref >60)
Glucose, Bld: 156 mg/dL — ABNORMAL HIGH (ref 70–140)
Potassium: 4.2 mmol/L (ref 3.3–4.7)
Sodium: 137 mmol/L (ref 136–145)
Total Protein: 9.5 g/dL — ABNORMAL HIGH (ref 6.4–8.3)

## 2017-04-15 LAB — CBC WITH DIFFERENTIAL/PLATELET
BASOS ABS: 0 10*3/uL (ref 0.0–0.1)
Basophils Relative: 0 %
Eosinophils Absolute: 0 10*3/uL (ref 0.0–0.5)
Eosinophils Relative: 0 %
HEMATOCRIT: 32.8 % — AB (ref 34.8–46.6)
HEMOGLOBIN: 10.8 g/dL — AB (ref 11.6–15.9)
LYMPHS PCT: 10 %
Lymphs Abs: 0.5 10*3/uL — ABNORMAL LOW (ref 0.9–3.3)
MCH: 30.7 pg (ref 25.1–34.0)
MCHC: 32.9 g/dL (ref 31.5–36.0)
MCV: 93.2 fL (ref 79.5–101.0)
Monocytes Absolute: 0.1 10*3/uL (ref 0.1–0.9)
Monocytes Relative: 1 %
NEUTROS ABS: 4.7 10*3/uL (ref 1.5–6.5)
Neutrophils Relative %: 89 %
Platelets: 162 10*3/uL (ref 145–400)
RBC: 3.52 MIL/uL — AB (ref 3.70–5.45)
RDW: 15.5 % (ref 11.2–16.1)
WBC: 5.3 10*3/uL (ref 3.9–10.3)

## 2017-04-15 MED ORDER — DIPHENHYDRAMINE HCL 50 MG/ML IJ SOLN
INTRAMUSCULAR | Status: AC
  Filled 2017-04-15: qty 1

## 2017-04-15 MED ORDER — SODIUM CHLORIDE 0.9 % IV SOLN
Freq: Once | INTRAVENOUS | Status: AC
  Administered 2017-04-15: 11:00:00 via INTRAVENOUS

## 2017-04-15 MED ORDER — SODIUM CHLORIDE 0.9% FLUSH
10.0000 mL | Freq: Once | INTRAVENOUS | Status: AC
  Administered 2017-04-15: 10 mL
  Filled 2017-04-15: qty 10

## 2017-04-15 MED ORDER — TRAMADOL HCL 50 MG PO TABS: 50.0000 mg | ORAL_TABLET | Freq: Four times a day (QID) | ORAL | 1 refills | Status: DC | PRN

## 2017-04-15 MED ORDER — SODIUM CHLORIDE 0.9% FLUSH
10.0000 mL | INTRAVENOUS | Status: DC | PRN
  Administered 2017-04-15: 10 mL
  Filled 2017-04-15: qty 10

## 2017-04-15 MED ORDER — FAMOTIDINE IN NACL 20-0.9 MG/50ML-% IV SOLN
20.0000 mg | Freq: Once | INTRAVENOUS | Status: AC
  Administered 2017-04-15: 20 mg via INTRAVENOUS

## 2017-04-15 MED ORDER — SODIUM CHLORIDE 0.9 % IV SOLN
Freq: Once | INTRAVENOUS | Status: AC
  Administered 2017-04-15: 12:00:00 via INTRAVENOUS
  Filled 2017-04-15: qty 5

## 2017-04-15 MED ORDER — DIPHENHYDRAMINE HCL 50 MG/ML IJ SOLN
50.0000 mg | Freq: Once | INTRAMUSCULAR | Status: AC
  Administered 2017-04-15: 50 mg via INTRAVENOUS

## 2017-04-15 MED ORDER — PALONOSETRON HCL INJECTION 0.25 MG/5ML
0.2500 mg | Freq: Once | INTRAVENOUS | Status: AC
  Administered 2017-04-15: 0.25 mg via INTRAVENOUS

## 2017-04-15 MED ORDER — SODIUM CHLORIDE 0.9 % IV SOLN
698.4000 mg | Freq: Once | INTRAVENOUS | Status: AC
  Administered 2017-04-15: 700 mg via INTRAVENOUS
  Filled 2017-04-15: qty 70

## 2017-04-15 MED ORDER — PACLITAXEL CHEMO INJECTION 300 MG/50ML
105.0000 mg/m2 | Freq: Once | INTRAVENOUS | Status: AC
  Administered 2017-04-15: 258 mg via INTRAVENOUS
  Filled 2017-04-15: qty 43

## 2017-04-15 MED ORDER — FAMOTIDINE IN NACL 20-0.9 MG/50ML-% IV SOLN
INTRAVENOUS | Status: AC
  Filled 2017-04-15: qty 50

## 2017-04-15 MED ORDER — PALONOSETRON HCL INJECTION 0.25 MG/5ML
INTRAVENOUS | Status: AC
  Filled 2017-04-15: qty 5

## 2017-04-15 MED ORDER — GABAPENTIN 300 MG PO CAPS: 300.0000 mg | ORAL_CAPSULE | Freq: Two times a day (BID) | ORAL | 11 refills | Status: DC

## 2017-04-15 MED ORDER — HEPARIN SOD (PORK) LOCK FLUSH 100 UNIT/ML IV SOLN
500.0000 [IU] | Freq: Once | INTRAVENOUS | Status: AC | PRN
  Administered 2017-04-15: 500 [IU]
  Filled 2017-04-15: qty 5

## 2017-04-15 NOTE — Assessment & Plan Note (Signed)
She has chemotherapy-induced nausea I recommend antiemetics as tolerated needed

## 2017-04-15 NOTE — Assessment & Plan Note (Signed)
She tolerated cycle 1 of chemotherapy poorly due to peripheral neuropathy For cycle 2 onwards, the dose of Taxol to 50% I told her that the dose adjustment will not compromised to benefit of her treatment We need to preserve her neurological function Due to her morbid obesity, I will continue only mildly reduced dose of carboplatin

## 2017-04-15 NOTE — Progress Notes (Signed)
[facility] OFFICE PROGRESS NOTE  Patient Care Team: Shirline Frees, MD as PCP - General (Family Medicine)  SUMMARY OF ONCOLOGIC HISTORY: Oncology History   Recurrent endometrial cancer, strong ER positive 90%, PR 90%, MSI stable Clear cell mixed features of ovaries in 2015 Recurrent disease 2018: low grade endometroid       Endometrial cancer ([place])   02/24/2013 Miscellaneous    Pulmonary embolus  IVC filter was placed and she was started on Lovenox.  She was subsequently transitioned to warfarin.       03/07/2013 Pathology Results    1. Cervix, biopsy - HIGH GRADE ENDOMETRIAL CARCINOMA, SEE COMMENT. 2. Endometrium, biopsy - HIGH GRADE ENDOMETRIAL CARCINOMA, SEE COMMENT. Microscopic Comment 1. , 2. In both parts 1 and 2, slide sections demonstrate extensive involvement by high grade endometrial carcinoma demonstrating a biphasic epithelial and stromal architecture. The immunophenotype (strong diffuse estrogen receptor; strong diffuse vimentin expression; patchy monoclonal CEA expression, patchy p16 immunostain expression, and diffuse mild to moderate p53 expression) is consistent with primary endometrial origin. 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       03/14/2013 - 01/04/2014 Chemotherapy    taxol carboplatin x 9      08/01/2013 Surgery    Procedure(s) Performed: 1. Exploratory laparotomy with total hysterectomy bilateral salpingo-oophorectomy, omental biopsies.  Surgeon: Francetta Found.  Skeet Latch, MD., PhD  Assistant Surgeon: Lahoma Crocker M.D.   Specimens: Uterus Cervix, Bilateral tubes / ovaries, lower  omentum.   Operative Findings: A 24cm left ovarian mass hemosiderine filled with omental adhesions.  Evidence of chemotherapeutic changes over the bowel.  Uterus 14 cm.  No palpable hepatic mets but the  examination was limited by diaphragmatic adhesions.  No gross disease in the omentum. TAH BSO omentectomy Left ovary 20cm clear cell malignancy  Uterus Carcinosarcoma  Multiple microscopic foci, measuring up to 0.5 cm in greatest dimension. Histologic type: Carcinosarcoma (MMMT) with heterologous component (ossification) Grade: High grade Myometrial invasion: 2.1 cm where myometrium is 2.5 cm in thickness Cervical stromal involvement: No.      08/01/2013 Pathology Results    1. Adnexa - ovary +/- tube, neoplastic, left with omentum - HIGH GRADE CARCINOMA WITH BOTH CLEAR CELL CARCINOMA AND ENDOMETRIOID CARCINOMA COMPONENTS, 20 CM. PLEASE SEE ONCOLOGY TEMPLATE FOR DETAIL. 2. Uterus and cervix, with right fallopian tube and right ovary - MICROSCOPIC FOCI OF RESIDUAL CARCINOSARCOMA WITH HETEROLOGOUS ELEMENT, INVOLVING OUTER HALF OF THE MYOMETRIUM. - EXTENSIVE ADENOMYOSIS AND LEIOMYOMA. - CERVIX: BENIGN SQUAMOUS MUCOSA AND ENDOCERVICAL MUCOSA, NO DYSPLASIA OR MALIGNANCY. - RIGHT OVARY AND FALLOPIAN TUBES: NO PATHOLOGIC ABNORMALITIES. Microscopic Comment  1. OVARY Specimen(s): Left ovary and fallopian tube. Procedure: (including lymph node sampling): Left salpingo-oophorectomy Primary tumor site (including laterality): Left ovary Ovarian surface involvement: Present. Ovarian capsule intact without fragmentation: No. Maximum tumor size (cm): 20 cm, gross measurement. Histologic type: Mixed surface epithelial carcinoma with predominant clear cell carcinoma component (70%) and minor endometrioid component (30%) Please see comment for detail. Grade: High grade Peritoneal implants: (specify invasive or non-invasive): N/A Pelvic extension (list additional structures on separate lines and if involved): No. Lymph nodes: number examined - N/A ; number positive - N/A TNM code: ypT1c2, pNX FIGO Stage (based on pathologic findings, needs clinical correlation): at least IC2 Comments: Received  grossly is a 20 cm focally disrupted and partially collapsed multicystic ovary with attached/adherent omentum. Microscopically, sectioned show an  invasive high grade surface carcinoma arising in a background of endometriotic cyst. The tumor is predominately composed of clear cell carcinoma component with minor endometrioid carcinoma component. No tumor involvement is identified in the adherent omentum tissue. Immunohistochemical stains were performed and the clear cell carcinoma is patchy positive for p16, ER, p53, negative for WT-1 and p63. Focal angiolymphatic invasion is present  The morphologic features are different from those seen in the uterus. 2. ONCOLOGY TABLE-UTERUS, CARCINOSARCOMA Specimen: Uterus, cervix and right fallopian tubes and ovaries. Procedure: Total hysterectomy and right salpingo-oophorectomy. Lymph node sampling performed: No. Specimen integrity: Intact. Maximum tumor size: Multiple microscopic foci, measuring up to 0.5 cm in greatest dimension. Histologic type: Carcinosarcoma (MMMT) with heterologous component (ossification) Grade: High grade Myometrial invasion: 2.1 cm where myometrium is 2.5 cm in thickness Cervical stromal involvement: No. Extent of involvement of other organs: No. Lymph - vascular invasion: Not identified. Peritoneal washings: N/A Lymph nodes: # examined N/A ; # positive N/A Pelvic lymph nodes: N/A involved of N/A lymph nodes. Para-aortic lymph nodes: N/A involved of N/A lymph nodes. Other (specify involvement and site): N/A TNM code: ypT1b, ypNX FIGO Stage (based on pathologic findings, needs clinical correlation): IB Comment: Immunohistochemical stains were performed and the residual carcinoma is positive for p16, negative for WT-1, weakly positive for p53 and ER. The morphologic features are different from those seen in the left ovary. (HCL:gt, 08/04/13)      02/21/2014 - 03/09/2014 Radiation Therapy    Received vaginal  brachytherapy TREATMENT DATES: 12/2, 12/7, 12/11, 12/14 and 03/09/14  SITE/DOSE: Vaginal Cuff 4 cm treatment length / 30 Gy in 5 fractions  Technique: HDR with Ir-192       12/17/2016 Relapse/Recurrence    2cm rubbery like mass in the RV septum on physical examination      12/23/2016 Imaging    Reproductive: Previous hysterectomy. New soft tissue mass seen between the vaginal cuff and anterior rectal wall which measures 2.7 x 2.6 cm on image 112/ 2. This is consistent with locally recurrent endometrial carcinoma.      01/05/2017 Imaging    1. Previously noted pelvic mass is similar in size and appearance to the prior examination from 12/23/2016 demonstrating some low-level hypermetabolism. This is nonspecific, and could represent a lesion of rectal, cervical or uterine origin. 2. No definite findings to suggest metastatic disease in the chest, abdomen or pelvis. 3. Small focus of hypermetabolism in the distal third of the esophagus without a perceivable abnormality on the CT portion of the examination. This could represent physiologic activity, a focal area of esophagitis, or an underlying lesion. Clinical correlation is recommended, with consideration for followup upper endoscopy if clinically appropriate. 4. Nonspecific low-level hypermetabolism in a left axillary lymph node which is stable in size and appearance compared to prior examinations. This is favored to be benign, but correlation with routine annual mammography is recommended. 5. Dilatation of the pulmonic trunk (4 cm in diameter), concerning for pulmonary arterial hypertension. 6. Severe hepatic steatosis.      01/05/2017 PET scan    1. Previously noted pelvic mass is similar in size and appearance to the prior examination from 12/23/2016 demonstrating some low-level hypermetabolism. This is nonspecific, and could represent a lesion of rectal,  cervical or uterine origin. 2. No definite findings to suggest metastatic disease in the chest, abdomen or pelvis. 3. Small focus of hypermetabolism in the distal third of the esophagus without a perceivable abnormality on the CT portion of the examination. This could  represent physiologic activity, a focal area of esophagitis, or an underlying lesion. Clinical correlation is recommended, with consideration for followup upper endoscopy if clinically appropriate. 4. Nonspecific low-level hypermetabolism in a left axillary lymph node which is stable in size and appearance compared to prior examinations. This is favored to be benign, but correlation with routine annual mammography is recommended. 5. Dilatation of the pulmonic trunk (4 cm in diameter), concerning for pulmonary arterial hypertension. 6. Severe hepatic steatosis. 7. Aortic atherosclerosis, in addition to left anterior descending coronary artery disease. Please note that although the presence of coronary artery calcium documents the presence of coronary artery disease, the severity of this disease and any potential stenosis cannot be assessed on this non-gated CT examination. Assessment for potential risk factor modification, dietary therapy or pharmacologic therapy may be warranted, if clinically indicated. 8. Additional incidental findings, as above. Aortic Atherosclerosis (ICD10-I70.0).      01/27/2017 Surgery    She underwent diagnostic laparoscopy, robotic assisted resection of intraperitoneal mass, infracolic omentectomy, peritoneal biopsies and rigid proctoscopy. R1 resection with miliary disease remaining in tumor bed      02/04/2017 Pathology Results    A: Cul-de-sac mass, biopsy  - Endometrioid adenocarcinoma, FIGO grade 1 (architecture grade 1, nuclear grade 2) (see comment)  B: Peritoneum, pelvic, biopsy  - Fragments of fibrovascular tissue with hemorrhage - No malignancy identified  C: Cul-de-sac, biopsy  -  Endometrioid adenocarcinoma, FIGO grade 1 (architecture grade 1, nuclear grade 2), with squamous differentiation (see comment)  D: Omentum, omentectomy  - Benign omentum  - No malignancy identified  The purpose of this addendum is to report the results of immunohistochemical stains for mismatch repair proteins and hormone receptors performed on a representative block of tumor.  Immunostains for mismatch repair proteins were performed on block C2 and show intact expression of MLH1, PMS2, MSH2, and MSH6 within the tumor. This is the normal phenotype and does NOT support a diagnosis of microsatellite instability/hereditary non-polyposis colorectal cancer (HNPCC) associated carcinoma. Molecular testing for additional microsatellite instability markers will be performed by the [facility] [phone]) and reported separately.  Estrogen receptor (Ventana, clone SP1):  Positive (90%, weak to moderate)  Progesterone receptor (Ventana, clone 1E2):  Positive (90%, strong)      02/16/2017 Procedure    Successful placement of a right internal jugular approach power injectable Port-A-Cath. The catheter is ready for immediate use.      03/04/2017 -  Chemotherapy    The patient had Carboplatin and Taxol for chemotherapy treatment.         Ovarian cancer ([place])   08/01/2013 Initial Diagnosis    Ovarian cancer ([place]) Unstaged.  Incidental pathology finding      2016 Genetic Testing     Results revealed patient has the following mutation(s): NEGATIVE        INTERVAL HISTORY: Please see below for problem oriented charting. She returns for cycle 3 of chemotherapy With recent dose adjustment, she tolerated treatment better She has some mild nausea, stable with antiemetics Peripheral neuropathy is a slightly improved Denies constipation No recent infection  REVIEW OF SYSTEMS:   Constitutional: Denies fevers, chills or abnormal weight loss Eyes: Denies blurriness of  vision Ears, nose, mouth, throat, and face: Denies mucositis or sore throat Respiratory: Denies cough, dyspnea or wheezes Cardiovascular: Denies palpitation, chest discomfort or lower extremity swelling Gastrointestinal:  Denies nausea, heartburn or change in bowel habits Skin: Denies abnormal skin rashes Lymphatics: Denies new lymphadenopathy or easy bruising Neurological:Denies numbness,  tingling or new weaknesses Behavioral/Psych: Mood is stable, no new changes  All other systems were reviewed with the patient and are negative.  I have reviewed the past medical history, past surgical history, social history and family history with the patient and they are unchanged from previous note.  ALLERGIES:  has No Known Allergies.  MEDICATIONS:  Current Outpatient Medications  Medication Sig Dispense Refill  . aspirin EC 81 MG tablet Take 81 mg by mouth daily.    . cetirizine (ZYRTEC) 10 MG tablet Take 10 mg by mouth daily.    Marland Kitchen dexamethasone (DECADRON) 4 MG tablet Take 5 tablets the day before chemo and 5 tablets the morning of chemotherapy with food every 21 days 60 tablet 1  . gabapentin (NEURONTIN) 300 MG capsule Take 1 capsule (300 mg total) by mouth 2 (two) times daily. 90 capsule 11  . lidocaine-prilocaine (EMLA) cream Apply to affected area once 30 g 3  . magic mouthwash w/lidocaine SOLN Take 5 mLs by mouth 4 (four) times daily as needed for mouth pain. 400 mL 0  . ondansetron (ZOFRAN) 8 MG tablet Take 1 tablet (8 mg total) by mouth 2 (two) times daily as needed for refractory nausea / vomiting. Start on day 3 after chemo. 30 tablet 1  . prochlorperazine (COMPAZINE) 10 MG tablet Take 1 tablet (10 mg total) by mouth every 6 (six) hours as needed (Nausea or vomiting). 30 tablet 1  . traMADol (ULTRAM) 50 MG tablet Take 1 tablet (50 mg total) by mouth every 6 (six) hours as needed. 90 tablet 1   No current facility-administered medications for this visit.     PHYSICAL EXAMINATION: ECOG  PERFORMANCE STATUS: 1 - Symptomatic but completely ambulatory  Vitals:   04/15/17 0954  BP: 128/79  Pulse: 99  Resp: 19  Temp: 98.4 F (36.9 C)  SpO2: (!) 10%   Filed Weights   04/15/17 0954  Weight: 281 lb 11.2 oz (127.8 kg)    GENERAL:alert, no distress and comfortable SKIN: skin color, texture, turgor are normal, no rashes or significant lesions EYES: normal, Conjunctiva are pink and non-injected, sclera clear OROPHARYNX:no exudate, no erythema and lips, buccal mucosa, and tongue normal  NECK: supple, thyroid normal size, non-tender, without nodularity LYMPH:  no palpable lymphadenopathy in the cervical, axillary or inguinal LUNGS: clear to auscultation and percussion with normal breathing effort HEART: regular rate & rhythm and no murmurs and no lower extremity edema ABDOMEN:abdomen soft, non-tender and normal bowel sounds Musculoskeletal:no cyanosis of digits and no clubbing  NEURO: alert & oriented x 3 with fluent speech, no focal motor/sensory deficits  LABORATORY DATA:  I have reviewed the data as listed    Component Value Date/Time   NA 137 03/25/2017 0956   K 4.1 03/25/2017 0956   CL 102 02/16/2017 1209   CO2 25 03/25/2017 0956   GLUCOSE 180 (H) 03/25/2017 0956   BUN 17.3 03/25/2017 0956   CREATININE 1.1 03/25/2017 0956   CALCIUM 9.4 03/25/2017 0956   PROT 9.1 (H) 03/25/2017 0956   ALBUMIN 3.6 03/25/2017 0956   AST 22 03/25/2017 0956   ALT 24 03/25/2017 0956   ALKPHOS 100 03/25/2017 0956   BILITOT 0.37 03/25/2017 0956   GFRNONAA 53 (L) 02/16/2017 1209   GFRAA >60 02/16/2017 1209    No results found for: SPEP, UPEP  Lab Results  Component Value Date   WBC 5.3 04/15/2017   NEUTROABS 4.7 04/15/2017   HGB 10.8 (L) 04/15/2017   HCT 32.8 (L)  04/15/2017   MCV 93.2 04/15/2017   PLT 162 04/15/2017      Chemistry      Component Value Date/Time   NA 137 03/25/2017 0956   K 4.1 03/25/2017 0956   CL 102 02/16/2017 1209   CO2 25 03/25/2017 0956   BUN  17.3 03/25/2017 0956   CREATININE 1.1 03/25/2017 0956      Component Value Date/Time   CALCIUM 9.4 03/25/2017 0956   ALKPHOS 100 03/25/2017 0956   AST 22 03/25/2017 0956   ALT 24 03/25/2017 0956   BILITOT 0.37 03/25/2017 0956      ASSESSMENT & PLAN:  Endometrial cancer ([place]) She tolerated cycle 1 of chemotherapy poorly due to peripheral neuropathy For cycle 2 onwards, the dose of Taxol to 50% I told her that the dose adjustment will not compromised to benefit of her treatment We need to preserve her neurological function Due to her morbid obesity, I will continue only mildly reduced dose of carboplatin  Chemotherapy-induced neuropathy ([facility]) She has worsening neuropathy but is not able to tolerate gabapentin at higher dose Recommend reducing the dose of carboplatin and Taxol and she agreed She will continue on gabapentin for now.  Chemotherapy-induced nausea She has chemotherapy-induced nausea I recommend antiemetics as tolerated needed   No orders of the defined types were placed in this encounter.  All questions were answered. The patient knows to call the clinic with any problems, questions or concerns. No barriers to learning was detected. I spent 15 minutes counseling the patient face to face. The total time spent in the appointment was 20 minutes and more than 50% was on counseling and review of test results     Heath Lark, MD 04/15/2017 10:23 AM

## 2017-04-15 NOTE — Patient Instructions (Signed)
[facility] Discharge Instructions for Patients Receiving Chemotherapy  Today you received the following chemotherapy agents Taxol and carboplatin  To help prevent nausea and vomiting after your treatment, we encourage you to take your nausea medication as directed   If you develop nausea and vomiting that is not controlled by your nausea medication, call the clinic.   BELOW ARE SYMPTOMS THAT SHOULD BE REPORTED IMMEDIATELY:  *FEVER GREATER THAN 100.5 F  *CHILLS WITH OR WITHOUT FEVER  NAUSEA AND VOMITING THAT IS NOT CONTROLLED WITH YOUR NAUSEA MEDICATION  *UNUSUAL SHORTNESS OF BREATH  *UNUSUAL BRUISING OR BLEEDING  TENDERNESS IN MOUTH AND THROAT WITH OR WITHOUT PRESENCE OF ULCERS  *URINARY PROBLEMS  *BOWEL PROBLEMS  UNUSUAL RASH Items with * indicate a potential emergency and should be followed up as soon as possible.  Feel free to call the clinic should you have any questions or concerns. The clinic phone number is [phone].  Please show the CHEMO ALERT CARD at check-in to the Emergency Department and triage nurse.   

## 2017-04-15 NOTE — Assessment & Plan Note (Signed)
She has worsening neuropathy but is not able to tolerate gabapentin at higher dose Recommend reducing the dose of carboplatin and Taxol and she agreed She will continue on gabapentin for now.

## 2017-04-16 ENCOUNTER — Telehealth: Payer: Self-pay | Admitting: Hematology and Oncology

## 2017-04-16 ENCOUNTER — Other Ambulatory Visit: Payer: Self-pay | Admitting: Hematology and Oncology

## 2017-04-16 NOTE — Telephone Encounter (Signed)
Return for no new orders or visits per 1/24 los.

## 2017-04-22 ENCOUNTER — Ambulatory Visit: Payer: BLUE CROSS/BLUE SHIELD | Admitting: Gynecologic Oncology

## 2017-05-06 ENCOUNTER — Encounter: Payer: Self-pay | Admitting: Hematology and Oncology

## 2017-05-06 ENCOUNTER — Inpatient Hospital Stay (HOSPITAL_BASED_OUTPATIENT_CLINIC_OR_DEPARTMENT_OTHER): Payer: BLUE CROSS/BLUE SHIELD | Admitting: Hematology and Oncology

## 2017-05-06 ENCOUNTER — Inpatient Hospital Stay: Payer: BLUE CROSS/BLUE SHIELD

## 2017-05-06 ENCOUNTER — Telehealth: Payer: Self-pay | Admitting: Hematology and Oncology

## 2017-05-06 ENCOUNTER — Inpatient Hospital Stay: Payer: BLUE CROSS/BLUE SHIELD | Attending: Hematology and Oncology

## 2017-05-06 DIAGNOSIS — D61818 Other pancytopenia: Secondary | ICD-10-CM | POA: Diagnosis not present

## 2017-05-06 DIAGNOSIS — G620 Drug-induced polyneuropathy: Secondary | ICD-10-CM

## 2017-05-06 DIAGNOSIS — C541 Malignant neoplasm of endometrium: Secondary | ICD-10-CM

## 2017-05-06 DIAGNOSIS — C569 Malignant neoplasm of unspecified ovary: Secondary | ICD-10-CM

## 2017-05-06 DIAGNOSIS — Z79899 Other long term (current) drug therapy: Secondary | ICD-10-CM | POA: Insufficient documentation

## 2017-05-06 DIAGNOSIS — C562 Malignant neoplasm of left ovary: Secondary | ICD-10-CM | POA: Diagnosis not present

## 2017-05-06 DIAGNOSIS — G629 Polyneuropathy, unspecified: Secondary | ICD-10-CM

## 2017-05-06 DIAGNOSIS — R110 Nausea: Secondary | ICD-10-CM

## 2017-05-06 DIAGNOSIS — Z5111 Encounter for antineoplastic chemotherapy: Secondary | ICD-10-CM | POA: Diagnosis not present

## 2017-05-06 DIAGNOSIS — T451X5A Adverse effect of antineoplastic and immunosuppressive drugs, initial encounter: Secondary | ICD-10-CM

## 2017-05-06 LAB — CBC WITH DIFFERENTIAL/PLATELET
Basophils Absolute: 0 10*3/uL (ref 0.0–0.1)
Basophils Relative: 0 %
EOS PCT: 0 %
Eosinophils Absolute: 0 10*3/uL (ref 0.0–0.5)
HEMATOCRIT: 28.5 % — AB (ref 34.8–46.6)
Hemoglobin: 9.5 g/dL — ABNORMAL LOW (ref 11.6–15.9)
LYMPHS PCT: 10 %
Lymphs Abs: 0.7 10*3/uL — ABNORMAL LOW (ref 0.9–3.3)
MCH: 31.3 pg (ref 25.1–34.0)
MCHC: 33.3 g/dL (ref 31.5–36.0)
MCV: 93.8 fL (ref 79.5–101.0)
Monocytes Absolute: 0.4 10*3/uL (ref 0.1–0.9)
Monocytes Relative: 6 %
Neutro Abs: 5.3 10*3/uL (ref 1.5–6.5)
Neutrophils Relative %: 84 %
PLATELETS: 93 10*3/uL — AB (ref 145–400)
RBC: 3.04 MIL/uL — AB (ref 3.70–5.45)
RDW: 16.4 % — ABNORMAL HIGH (ref 11.2–14.5)
WBC: 6.3 10*3/uL (ref 3.9–10.3)

## 2017-05-06 LAB — COMPREHENSIVE METABOLIC PANEL
ALBUMIN: 3.5 g/dL (ref 3.5–5.0)
ALT: 18 U/L (ref 0–55)
AST: 22 U/L (ref 5–34)
Alkaline Phosphatase: 105 U/L (ref 40–150)
Anion gap: 10 (ref 3–11)
BUN: 14 mg/dL (ref 7–26)
CHLORIDE: 105 mmol/L (ref 98–109)
CO2: 23 mmol/L (ref 22–29)
Calcium: 8.8 mg/dL (ref 8.4–10.4)
Creatinine, Ser: 0.97 mg/dL (ref 0.60–1.10)
GFR calc Af Amer: >60 mL/min (ref >60)
GLUCOSE: 147 mg/dL — AB (ref 70–140)
POTASSIUM: 3.7 mmol/L (ref 3.5–5.1)
Sodium: 138 mmol/L (ref 136–145)
Total Bilirubin: 0.4 mg/dL (ref 0.2–1.2)
Total Protein: 8.8 g/dL — ABNORMAL HIGH (ref 6.4–8.3)

## 2017-05-06 MED ORDER — PALONOSETRON HCL INJECTION 0.25 MG/5ML
0.2500 mg | Freq: Once | INTRAVENOUS | Status: AC
  Administered 2017-05-06: 0.25 mg via INTRAVENOUS

## 2017-05-06 MED ORDER — DIPHENHYDRAMINE HCL 50 MG/ML IJ SOLN
INTRAMUSCULAR | Status: AC
  Filled 2017-05-06: qty 1

## 2017-05-06 MED ORDER — SODIUM CHLORIDE 0.9 % IV SOLN
20.0000 mg | Freq: Once | INTRAVENOUS | Status: AC
  Administered 2017-05-06: 20 mg via INTRAVENOUS
  Filled 2017-05-06: qty 2

## 2017-05-06 MED ORDER — SODIUM CHLORIDE 0.9% FLUSH
10.0000 mL | INTRAVENOUS | Status: DC | PRN
  Administered 2017-05-06: 10 mL
  Filled 2017-05-06: qty 10

## 2017-05-06 MED ORDER — SODIUM CHLORIDE 0.9 % IV SOLN
Freq: Once | INTRAVENOUS | Status: AC
  Administered 2017-05-06: 12:00:00 via INTRAVENOUS

## 2017-05-06 MED ORDER — SODIUM CHLORIDE 0.9 % IV SOLN
600.0000 mg | Freq: Once | INTRAVENOUS | Status: AC
  Administered 2017-05-06: 600 mg via INTRAVENOUS
  Filled 2017-05-06: qty 60

## 2017-05-06 MED ORDER — PALONOSETRON HCL INJECTION 0.25 MG/5ML
INTRAVENOUS | Status: AC
  Filled 2017-05-06: qty 5

## 2017-05-06 MED ORDER — SODIUM CHLORIDE 0.9% FLUSH
10.0000 mL | Freq: Once | INTRAVENOUS | Status: AC
  Administered 2017-05-06: 10 mL
  Filled 2017-05-06: qty 10

## 2017-05-06 MED ORDER — DIPHENHYDRAMINE HCL 50 MG/ML IJ SOLN
50.0000 mg | Freq: Once | INTRAMUSCULAR | Status: AC
  Administered 2017-05-06: 50 mg via INTRAVENOUS

## 2017-05-06 MED ORDER — FAMOTIDINE IN NACL 20-0.9 MG/50ML-% IV SOLN: 20.0000 mg | Freq: Once | INTRAVENOUS | Status: DC

## 2017-05-06 MED ORDER — FOSAPREPITANT DIMEGLUMINE INJECTION 150 MG
Freq: Once | INTRAVENOUS | Status: AC
  Administered 2017-05-06: 12:00:00 via INTRAVENOUS
  Filled 2017-05-06: qty 5

## 2017-05-06 MED ORDER — HEPARIN SOD (PORK) LOCK FLUSH 100 UNIT/ML IV SOLN
500.0000 [IU] | Freq: Once | INTRAVENOUS | Status: AC | PRN
  Administered 2017-05-06: 500 [IU]
  Filled 2017-05-06: qty 5

## 2017-05-06 MED ORDER — SODIUM CHLORIDE 0.9 % IV SOLN
105.0000 mg/m2 | Freq: Once | INTRAVENOUS | Status: AC
  Administered 2017-05-06: 258 mg via INTRAVENOUS
  Filled 2017-05-06: qty 43

## 2017-05-06 NOTE — Telephone Encounter (Signed)
Gave patient AVs and calendar of upcoming March appointments. °

## 2017-05-06 NOTE — Patient Instructions (Signed)
[facility] Discharge Instructions for Patients Receiving Chemotherapy  Today you received the following chemotherapy agents Taxol, Carboplatin  To help prevent nausea and vomiting after your treatment, we encourage you to take your nausea medication as directed  If you develop nausea and vomiting that is not controlled by your nausea medication, call the clinic.   BELOW ARE SYMPTOMS THAT SHOULD BE REPORTED IMMEDIATELY:  *FEVER GREATER THAN 100.5 F  *CHILLS WITH OR WITHOUT FEVER  NAUSEA AND VOMITING THAT IS NOT CONTROLLED WITH YOUR NAUSEA MEDICATION  *UNUSUAL SHORTNESS OF BREATH  *UNUSUAL BRUISING OR BLEEDING  TENDERNESS IN MOUTH AND THROAT WITH OR WITHOUT PRESENCE OF ULCERS  *URINARY PROBLEMS  *BOWEL PROBLEMS  UNUSUAL RASH Items with * indicate a potential emergency and should be followed up as soon as possible.  Feel free to call the clinic should you have any questions or concerns. The clinic phone number is [phone].  Please show the CHEMO ALERT CARD at check-in to the Emergency Department and triage nurse.   

## 2017-05-06 NOTE — Assessment & Plan Note (Signed)
She has chemotherapy-induced nausea I recommend antiemetics as tolerated needed

## 2017-05-06 NOTE — Assessment & Plan Note (Signed)
She has worsening neuropathy but is not able to tolerate gabapentin at higher dose Recommend reducing the dose of carboplatin and Taxol and she agreed She will continue on gabapentin for now.

## 2017-05-06 NOTE — Assessment & Plan Note (Signed)
We will proceed with treatment with dose adjustment She is not symptomatic from anemia or thrombocytopenia I educated her to monitor for signs of bleeding

## 2017-05-06 NOTE — Assessment & Plan Note (Signed)
She tolerated cycle 1 of chemotherapy poorly due to peripheral neuropathy For cycle 2 onwards, the dose of Taxol to 50% She has progressive pancytopenia, will reduce dose of carboplatin I told her that the dose adjustment will not compromised to benefit of her treatment

## 2017-05-06 NOTE — Progress Notes (Signed)
[facility] OFFICE PROGRESS NOTE  Patient Care Team: Shirline Frees, MD as PCP - General (Family Medicine)  ASSESSMENT & PLAN:  Endometrial cancer [facility]) She tolerated cycle 1 of chemotherapy poorly due to peripheral neuropathy For cycle 2 onwards, the dose of Taxol to 50% She has progressive pancytopenia, will reduce dose of carboplatin I told her that the dose adjustment will not compromised to benefit of her treatment  Pancytopenia, acquired ([place]) We will proceed with treatment with dose adjustment She is not symptomatic from anemia or thrombocytopenia I educated her to monitor for signs of bleeding  Chemotherapy-induced neuropathy ([place]) She has worsening neuropathy but is not able to tolerate gabapentin at higher dose Recommend reducing the dose of carboplatin and Taxol and she agreed She will continue on gabapentin for now.  Chemotherapy-induced nausea She has chemotherapy-induced nausea I recommend antiemetics as tolerated needed   No orders of the defined types were placed in this encounter.   INTERVAL HISTORY: Please see below for problem oriented charting. She returns with her husband, Nicole Kindred to be seen prior to cycle 4 of chemo She has persistent nausea, stable on anti-emetics She has neuropathy, stable on gabapentin She denies recent infection or constipation The patient denies any recent signs or symptoms of bleeding such as spontaneous epistaxis, hematuria or hematochezia.   SUMMARY OF ONCOLOGIC HISTORY: Oncology History   Recurrent endometrial cancer, strong ER positive 90%, PR 90%, MSI stable Clear cell mixed features of ovaries in 2015 Recurrent disease 2018: low grade endometroid       Endometrial cancer ([place])   02/24/2013 Miscellaneous    Pulmonary embolus  IVC filter was placed and she was started on Lovenox.  She was subsequently transitioned to warfarin.       03/07/2013 Pathology Results    1. Cervix, biopsy - HIGH GRADE  ENDOMETRIAL CARCINOMA, SEE COMMENT. 2. Endometrium, biopsy - HIGH GRADE ENDOMETRIAL CARCINOMA, SEE COMMENT. Microscopic Comment 1. , 2. In both parts 1 and 2, slide sections demonstrate extensive involvement by high grade endometrial carcinoma demonstrating a biphasic epithelial and stromal architecture. The immunophenotype (strong diffuse estrogen receptor; strong diffuse vimentin expression; patchy monoclonal CEA expression, patchy p16 immunostain expression, and diffuse mild to moderate p53 expression) is consistent with primary endometrial origin. 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       03/14/2013 - 01/04/2014 Chemotherapy    taxol carboplatin x 9      08/01/2013 Surgery    Procedure(s) Performed: 1. Exploratory laparotomy with total hysterectomy bilateral salpingo-oophorectomy, omental biopsies.  Surgeon: Francetta Found.  Skeet Latch, MD., PhD  Assistant Surgeon: Lahoma Crocker M.D.   Specimens: Uterus Cervix, Bilateral tubes / ovaries, lower  omentum.   Operative Findings: A 24cm left ovarian mass hemosiderine filled with omental adhesions.  Evidence of chemotherapeutic changes over the bowel.  Uterus 14 cm.  No palpable hepatic mets but the examination was limited by diaphragmatic adhesions.  No gross disease in the omentum. TAH BSO omentectomy Left ovary 20cm clear cell malignancy  Uterus Carcinosarcoma  Multiple microscopic foci, measuring up to 0.5 cm in greatest dimension. Histologic type: Carcinosarcoma (MMMT) with heterologous component (ossification) Grade: High grade Myometrial invasion: 2.1 cm where myometrium is 2.5 cm in thickness Cervical stromal involvement: No.      08/01/2013 Pathology Results    1. Adnexa - ovary +/- tube, neoplastic, left with omentum - HIGH GRADE  CARCINOMA WITH BOTH CLEAR CELL CARCINOMA AND  ENDOMETRIOID CARCINOMA COMPONENTS, 20 CM. PLEASE SEE ONCOLOGY TEMPLATE FOR DETAIL. 2. Uterus and cervix, with right fallopian tube and right ovary - MICROSCOPIC FOCI OF RESIDUAL CARCINOSARCOMA WITH HETEROLOGOUS ELEMENT, INVOLVING OUTER HALF OF THE MYOMETRIUM. - EXTENSIVE ADENOMYOSIS AND LEIOMYOMA. - CERVIX: BENIGN SQUAMOUS MUCOSA AND ENDOCERVICAL MUCOSA, NO DYSPLASIA OR MALIGNANCY. - RIGHT OVARY AND FALLOPIAN TUBES: NO PATHOLOGIC ABNORMALITIES. Microscopic Comment  1. OVARY Specimen(s): Left ovary and fallopian tube. Procedure: (including lymph node sampling): Left salpingo-oophorectomy Primary tumor site (including laterality): Left ovary Ovarian surface involvement: Present. Ovarian capsule intact without fragmentation: No. Maximum tumor size (cm): 20 cm, gross measurement. Histologic type: Mixed surface epithelial carcinoma with predominant clear cell carcinoma component (70%) and minor endometrioid component (30%) Please see comment for detail. Grade: High grade Peritoneal implants: (specify invasive or non-invasive): N/A Pelvic extension (list additional structures on separate lines and if involved): No. Lymph nodes: number examined - N/A ; number positive - N/A TNM code: ypT1c2, pNX FIGO Stage (based on pathologic findings, needs clinical correlation): at least IC2 Comments: Received grossly is a 20 cm focally disrupted and partially collapsed multicystic ovary with attached/adherent omentum. Microscopically, sectioned show an invasive high grade surface carcinoma arising in a background of endometriotic cyst. The tumor is predominately composed of clear cell carcinoma component with minor endometrioid carcinoma component. No tumor involvement is identified in the adherent omentum tissue. Immunohistochemical stains were performed and the clear cell carcinoma is patchy positive for p16, ER, p53, negative for WT-1 and p63. Focal angiolymphatic invasion is present  The morphologic  features are different from those seen in the uterus. 2. ONCOLOGY TABLE-UTERUS, CARCINOSARCOMA Specimen: Uterus, cervix and right fallopian tubes and ovaries. Procedure: Total hysterectomy and right salpingo-oophorectomy. Lymph node sampling performed: No. Specimen integrity: Intact. Maximum tumor size: Multiple microscopic foci, measuring up to 0.5 cm in greatest dimension. Histologic type: Carcinosarcoma (MMMT) with heterologous component (ossification) Grade: High grade Myometrial invasion: 2.1 cm where myometrium is 2.5 cm in thickness Cervical stromal involvement: No. Extent of involvement of other organs: No. Lymph - vascular invasion: Not identified. Peritoneal washings: N/A Lymph nodes: # examined N/A ; # positive N/A Pelvic lymph nodes: N/A involved of N/A lymph nodes. Para-aortic lymph nodes: N/A involved of N/A lymph nodes. Other (specify involvement and site): N/A TNM code: ypT1b, ypNX FIGO Stage (based on pathologic findings, needs clinical correlation): IB Comment: Immunohistochemical stains were performed and the residual carcinoma is positive for p16, negative for WT-1, weakly positive for p53 and ER. The morphologic features are different from those seen in the left ovary. (HCL:gt, 08/04/13)      02/21/2014 - 03/09/2014 Radiation Therapy    Received vaginal brachytherapy TREATMENT DATES: 12/2, 12/7, 12/11, 12/14 and 03/09/14  SITE/DOSE: Vaginal Cuff 4 cm treatment length / 30 Gy in 5 fractions  Technique: HDR with Ir-192       12/17/2016 Relapse/Recurrence    2cm rubbery like mass in the RV septum on physical examination      12/23/2016 Imaging    Reproductive: Previous hysterectomy. New soft tissue mass seen between the vaginal cuff and anterior rectal wall which measures 2.7 x 2.6 cm on image 112/ 2. This is consistent with locally recurrent endometrial carcinoma.      01/05/2017 Imaging     1. Previously noted pelvic mass is similar in size and appearance to the prior examination from 12/23/2016 demonstrating some low-level hypermetabolism. This is nonspecific, and could represent a lesion of rectal,  cervical or uterine origin. 2. No definite findings to suggest metastatic disease in the chest, abdomen or pelvis. 3. Small focus of hypermetabolism in the distal third of the esophagus without a perceivable abnormality on the CT portion of the examination. This could represent physiologic activity, a focal area of esophagitis, or an underlying lesion. Clinical correlation is recommended, with consideration for followup upper endoscopy if clinically appropriate. 4. Nonspecific low-level hypermetabolism in a left axillary lymph node which is stable in size and appearance compared to prior examinations. This is favored to be benign, but correlation with routine annual mammography is recommended. 5. Dilatation of the pulmonic trunk (4 cm in diameter), concerning for pulmonary arterial hypertension. 6. Severe hepatic steatosis.      01/05/2017 PET scan    1. Previously noted pelvic mass is similar in size and appearance to the prior examination from 12/23/2016 demonstrating some low-level hypermetabolism. This is nonspecific, and could represent a lesion of rectal, cervical or uterine origin. 2. No definite findings to suggest metastatic disease in the chest, abdomen or pelvis. 3. Small focus of hypermetabolism in the distal third of the esophagus without a perceivable abnormality on the CT portion of the examination. This could represent physiologic activity, a focal area of esophagitis, or an underlying lesion. Clinical correlation is recommended, with consideration for followup upper endoscopy if clinically appropriate. 4. Nonspecific low-level hypermetabolism in a left axillary lymph node which is stable in size and appearance compared to prior examinations. This is favored to be  benign, but correlation with routine annual mammography is recommended. 5. Dilatation of the pulmonic trunk (4 cm in diameter), concerning for pulmonary arterial hypertension. 6. Severe hepatic steatosis. 7. Aortic atherosclerosis, in addition to left anterior descending coronary artery disease. Please note that although the presence of coronary artery calcium documents the presence of coronary artery disease, the severity of this disease and any potential stenosis cannot be assessed on this non-gated CT examination. Assessment for potential risk factor modification, dietary therapy or pharmacologic therapy may be warranted, if clinically indicated. 8. Additional incidental findings, as above. Aortic Atherosclerosis (ICD10-I70.0).      01/27/2017 Surgery    She underwent diagnostic laparoscopy, robotic assisted resection of intraperitoneal mass, infracolic omentectomy, peritoneal biopsies and rigid proctoscopy. R1 resection with miliary disease remaining in tumor bed      02/04/2017 Pathology Results    A: Cul-de-sac mass, biopsy  - Endometrioid adenocarcinoma, FIGO grade 1 (architecture grade 1, nuclear grade 2) (see comment)  B: Peritoneum, pelvic, biopsy  - Fragments of fibrovascular tissue with hemorrhage - No malignancy identified  C: Cul-de-sac, biopsy  - Endometrioid adenocarcinoma, FIGO grade 1 (architecture grade 1, nuclear grade 2), with squamous differentiation (see comment)  D: Omentum, omentectomy  - Benign omentum  - No malignancy identified  The purpose of this addendum is to report the results of immunohistochemical stains for mismatch repair proteins and hormone receptors performed on a representative block of tumor.  Immunostains for mismatch repair proteins were performed on block C2 and show intact expression of MLH1, PMS2, MSH2, and MSH6 within the tumor. This is the normal phenotype and does NOT support a diagnosis of microsatellite instability/hereditary  non-polyposis colorectal cancer (HNPCC) associated carcinoma. Molecular testing for additional microsatellite instability markers will be performed by the [employer] [phone]) and reported separately.  Estrogen receptor (Ventana, clone SP1):  Positive (90%, weak to moderate)  Progesterone receptor (Ventana, clone 1E2):  Positive (90%, strong)      02/16/2017 Procedure  Successful placement of a right internal jugular approach power injectable Port-A-Cath. The catheter is ready for immediate use.      03/04/2017 -  Chemotherapy    The patient had Carboplatin and Taxol for chemotherapy treatment.         Ovarian cancer ([place])   08/01/2013 Initial Diagnosis    Ovarian cancer ([place]) Unstaged.  Incidental pathology finding      2016 Genetic Testing     Results revealed patient has the following mutation(s): NEGATIVE        REVIEW OF SYSTEMS:   Constitutional: Denies fevers, chills or abnormal weight loss Eyes: Denies blurriness of vision Ears, nose, mouth, throat, and face: Denies mucositis or sore throat Respiratory: Denies cough, dyspnea or wheezes Cardiovascular: Denies palpitation, chest discomfort or lower extremity swelling Skin: Denies abnormal skin rashes Lymphatics: Denies new lymphadenopathy or easy bruising Neurological:Denies numbness, tingling or new weaknesses Behavioral/Psych: Mood is stable, no new changes  All other systems were reviewed with the patient and are negative.  I have reviewed the past medical history, past surgical history, social history and family history with the patient and they are unchanged from previous note.  ALLERGIES:  has No Known Allergies.  MEDICATIONS:  Current Outpatient Medications  Medication Sig Dispense Refill  . aspirin EC 81 MG tablet Take 81 mg by mouth daily.    . cetirizine (ZYRTEC) 10 MG tablet Take 10 mg by mouth daily.    Marland Kitchen dexamethasone (DECADRON) 4 MG tablet Take 5 tablets the day  before chemo and 5 tablets the morning of chemotherapy with food every 21 days 60 tablet 1  . gabapentin (NEURONTIN) 300 MG capsule Take 1 capsule (300 mg total) by mouth 2 (two) times daily. 90 capsule 11  . lidocaine-prilocaine (EMLA) cream Apply to affected area once 30 g 3  . magic mouthwash w/lidocaine SOLN Take 5 mLs by mouth 4 (four) times daily as needed for mouth pain. 400 mL 0  . ondansetron (ZOFRAN) 8 MG tablet Take 1 tablet (8 mg total) by mouth 2 (two) times daily as needed for refractory nausea / vomiting. Start on day 3 after chemo. 30 tablet 1  . prochlorperazine (COMPAZINE) 10 MG tablet Take 1 tablet (10 mg total) by mouth every 6 (six) hours as needed (Nausea or vomiting). 30 tablet 1  . traMADol (ULTRAM) 50 MG tablet Take 1 tablet (50 mg total) by mouth every 6 (six) hours as needed. 90 tablet 1   No current facility-administered medications for this visit.     PHYSICAL EXAMINATION: ECOG PERFORMANCE STATUS: 1 - Symptomatic but completely ambulatory  Vitals:   05/06/17 1005  BP: (!) 146/89  Pulse: (!) 56  Resp: 18  Temp: 97.6 F (36.4 C)   Filed Weights   05/06/17 1005  Weight: 282 lb 11.2 oz (128.2 kg)    GENERAL:alert, no distress and comfortable. She is obese SKIN: skin color, texture, turgor are normal, no rashes or significant lesions EYES: normal, Conjunctiva are pink and non-injected, sclera clear OROPHARYNX:no exudate, no erythema and lips, buccal mucosa, and tongue normal  NECK: supple, thyroid normal size, non-tender, without nodularity LYMPH:  no palpable lymphadenopathy in the cervical, axillary or inguinal LUNGS: clear to auscultation and percussion with normal breathing effort HEART: regular rate & rhythm and no murmurs and no lower extremity edema ABDOMEN:abdomen soft, non-tender and normal bowel sounds Musculoskeletal:no cyanosis of digits and no clubbing  NEURO: alert & oriented x 3 with fluent speech, no focal motor/sensory  deficits  LABORATORY DATA:  I have reviewed the data as listed    Component Value Date/Time   NA 138 05/06/2017 0912   NA 137 03/25/2017 0956   K 3.7 05/06/2017 0912   K 4.1 03/25/2017 0956   CL 105 05/06/2017 0912   CO2 23 05/06/2017 0912   CO2 25 03/25/2017 0956   GLUCOSE 147 (H) 05/06/2017 0912   GLUCOSE 180 (H) 03/25/2017 0956   BUN 14 05/06/2017 0912   BUN 17.3 03/25/2017 0956   CREATININE 0.97 05/06/2017 0912   CREATININE 1.1 03/25/2017 0956   CALCIUM 8.8 05/06/2017 0912   CALCIUM 9.4 03/25/2017 0956   PROT 8.8 (H) 05/06/2017 0912   PROT 9.1 (H) 03/25/2017 0956   ALBUMIN 3.5 05/06/2017 0912   ALBUMIN 3.6 03/25/2017 0956   AST 22 05/06/2017 0912   AST 22 03/25/2017 0956   ALT 18 05/06/2017 0912   ALT 24 03/25/2017 0956   ALKPHOS 105 05/06/2017 0912   ALKPHOS 100 03/25/2017 0956   BILITOT 0.4 05/06/2017 0912   BILITOT 0.37 03/25/2017 0956   GFRNONAA >60 05/06/2017 0912   GFRAA >60 05/06/2017 0912    No results found for: SPEP, UPEP  Lab Results  Component Value Date   WBC 6.3 05/06/2017   NEUTROABS 5.3 05/06/2017   HGB 9.5 (L) 05/06/2017   HCT 28.5 (L) 05/06/2017   MCV 93.8 05/06/2017   PLT 93 (L) 05/06/2017      Chemistry      Component Value Date/Time   NA 138 05/06/2017 0912   NA 137 03/25/2017 0956   K 3.7 05/06/2017 0912   K 4.1 03/25/2017 0956   CL 105 05/06/2017 0912   CO2 23 05/06/2017 0912   CO2 25 03/25/2017 0956   BUN 14 05/06/2017 0912   BUN 17.3 03/25/2017 0956   CREATININE 0.97 05/06/2017 0912   CREATININE 1.1 03/25/2017 0956      Component Value Date/Time   CALCIUM 8.8 05/06/2017 0912   CALCIUM 9.4 03/25/2017 0956   ALKPHOS 105 05/06/2017 0912   ALKPHOS 100 03/25/2017 0956   AST 22 05/06/2017 0912   AST 22 03/25/2017 0956   ALT 18 05/06/2017 0912   ALT 24 03/25/2017 0956   BILITOT 0.4 05/06/2017 0912   BILITOT 0.37 03/25/2017 0956       All questions were answered. The patient knows to call the clinic with any problems,  questions or concerns. No barriers to learning was detected.  I spent 25 minutes counseling the patient face to face. The total time spent in the appointment was 30 minutes and more than 50% was on counseling and review of test results  Heath Lark, MD 05/06/2017 10:50 AM

## 2017-05-27 ENCOUNTER — Other Ambulatory Visit: Payer: Self-pay | Admitting: Hematology and Oncology

## 2017-05-27 ENCOUNTER — Inpatient Hospital Stay (HOSPITAL_BASED_OUTPATIENT_CLINIC_OR_DEPARTMENT_OTHER): Payer: BLUE CROSS/BLUE SHIELD | Admitting: Hematology and Oncology

## 2017-05-27 ENCOUNTER — Encounter: Payer: Self-pay | Admitting: Hematology and Oncology

## 2017-05-27 ENCOUNTER — Inpatient Hospital Stay: Payer: BLUE CROSS/BLUE SHIELD | Attending: Hematology and Oncology

## 2017-05-27 ENCOUNTER — Inpatient Hospital Stay: Payer: BLUE CROSS/BLUE SHIELD

## 2017-05-27 DIAGNOSIS — C541 Malignant neoplasm of endometrium: Secondary | ICD-10-CM

## 2017-05-27 DIAGNOSIS — G620 Drug-induced polyneuropathy: Secondary | ICD-10-CM

## 2017-05-27 DIAGNOSIS — C569 Malignant neoplasm of unspecified ovary: Secondary | ICD-10-CM

## 2017-05-27 DIAGNOSIS — T451X5A Adverse effect of antineoplastic and immunosuppressive drugs, initial encounter: Secondary | ICD-10-CM

## 2017-05-27 DIAGNOSIS — R739 Hyperglycemia, unspecified: Secondary | ICD-10-CM | POA: Insufficient documentation

## 2017-05-27 DIAGNOSIS — C562 Malignant neoplasm of left ovary: Secondary | ICD-10-CM | POA: Insufficient documentation

## 2017-05-27 DIAGNOSIS — F419 Anxiety disorder, unspecified: Secondary | ICD-10-CM | POA: Insufficient documentation

## 2017-05-27 DIAGNOSIS — D61818 Other pancytopenia: Secondary | ICD-10-CM | POA: Diagnosis not present

## 2017-05-27 DIAGNOSIS — G629 Polyneuropathy, unspecified: Secondary | ICD-10-CM | POA: Diagnosis not present

## 2017-05-27 DIAGNOSIS — Z5111 Encounter for antineoplastic chemotherapy: Secondary | ICD-10-CM | POA: Insufficient documentation

## 2017-05-27 DIAGNOSIS — Z79899 Other long term (current) drug therapy: Secondary | ICD-10-CM | POA: Insufficient documentation

## 2017-05-27 DIAGNOSIS — T380X5A Adverse effect of glucocorticoids and synthetic analogues, initial encounter: Secondary | ICD-10-CM | POA: Insufficient documentation

## 2017-05-27 DIAGNOSIS — I10 Essential (primary) hypertension: Secondary | ICD-10-CM | POA: Insufficient documentation

## 2017-05-27 LAB — CBC WITH DIFFERENTIAL/PLATELET
Basophils Absolute: 0 10*3/uL (ref 0.0–0.1)
Basophils Relative: 0 %
EOS ABS: 0 10*3/uL (ref 0.0–0.5)
EOS PCT: 0 %
HCT: 28.2 % — ABNORMAL LOW (ref 34.8–46.6)
Hemoglobin: 9.4 g/dL — ABNORMAL LOW (ref 11.6–15.9)
LYMPHS ABS: 0.4 10*3/uL — AB (ref 0.9–3.3)
Lymphocytes Relative: 13 %
MCH: 31.6 pg (ref 25.1–34.0)
MCHC: 33.3 g/dL (ref 31.5–36.0)
MCV: 94.9 fL (ref 79.5–101.0)
MONO ABS: 0 10*3/uL — AB (ref 0.1–0.9)
Monocytes Relative: 1 %
Neutro Abs: 2.9 10*3/uL (ref 1.5–6.5)
Neutrophils Relative %: 86 %
Platelets: 89 10*3/uL — ABNORMAL LOW (ref 145–400)
RBC: 2.97 MIL/uL — AB (ref 3.70–5.45)
RDW: 17.3 % — AB (ref 11.2–14.5)
WBC: 3.3 10*3/uL — AB (ref 3.9–10.3)

## 2017-05-27 LAB — COMPREHENSIVE METABOLIC PANEL
ALT: 24 U/L (ref 0–55)
AST: 24 U/L (ref 5–34)
Albumin: 3.5 g/dL (ref 3.5–5.0)
Alkaline Phosphatase: 102 U/L (ref 40–150)
Anion gap: 10 (ref 3–11)
BUN: 12 mg/dL (ref 7–26)
CHLORIDE: 105 mmol/L (ref 98–109)
CO2: 25 mmol/L (ref 22–29)
Calcium: 9.1 mg/dL (ref 8.4–10.4)
Creatinine, Ser: 0.99 mg/dL (ref 0.60–1.10)
GFR calc Af Amer: >60 mL/min (ref >60)
GLUCOSE: 179 mg/dL — AB (ref 70–140)
Potassium: 3.7 mmol/L (ref 3.5–5.1)
Sodium: 140 mmol/L (ref 136–145)
Total Bilirubin: 0.5 mg/dL (ref 0.2–1.2)
Total Protein: 8.9 g/dL — ABNORMAL HIGH (ref 6.4–8.3)

## 2017-05-27 MED ORDER — SODIUM CHLORIDE 0.9% FLUSH
10.0000 mL | INTRAVENOUS | Status: DC | PRN
  Administered 2017-05-27: 10 mL
  Filled 2017-05-27: qty 10

## 2017-05-27 MED ORDER — PALONOSETRON HCL INJECTION 0.25 MG/5ML
INTRAVENOUS | Status: AC
  Filled 2017-05-27: qty 5

## 2017-05-27 MED ORDER — HEPARIN SOD (PORK) LOCK FLUSH 100 UNIT/ML IV SOLN
500.0000 [IU] | Freq: Once | INTRAVENOUS | Status: AC | PRN
  Administered 2017-05-27: 500 [IU]
  Filled 2017-05-27: qty 5

## 2017-05-27 MED ORDER — SODIUM CHLORIDE 0.9% FLUSH
10.0000 mL | Freq: Once | INTRAVENOUS | Status: AC
  Administered 2017-05-27: 10 mL
  Filled 2017-05-27: qty 10

## 2017-05-27 MED ORDER — PACLITAXEL CHEMO INJECTION 300 MG/50ML
105.0000 mg/m2 | Freq: Once | INTRAVENOUS | Status: AC
  Administered 2017-05-27: 258 mg via INTRAVENOUS
  Filled 2017-05-27: qty 43

## 2017-05-27 MED ORDER — SODIUM CHLORIDE 0.9 % IV SOLN
20.0000 mg | Freq: Once | INTRAVENOUS | Status: AC
  Administered 2017-05-27: 20 mg via INTRAVENOUS
  Filled 2017-05-27: qty 2

## 2017-05-27 MED ORDER — FAMOTIDINE IN NACL 20-0.9 MG/50ML-% IV SOLN: 20.0000 mg | Freq: Once | INTRAVENOUS | Status: DC

## 2017-05-27 MED ORDER — DIPHENHYDRAMINE HCL 50 MG/ML IJ SOLN
INTRAMUSCULAR | Status: AC
  Filled 2017-05-27: qty 1

## 2017-05-27 MED ORDER — SODIUM CHLORIDE 0.9 % IV SOLN
Freq: Once | INTRAVENOUS | Status: AC
  Administered 2017-05-27: 12:00:00 via INTRAVENOUS

## 2017-05-27 MED ORDER — PALONOSETRON HCL INJECTION 0.25 MG/5ML
0.2500 mg | Freq: Once | INTRAVENOUS | Status: AC
  Administered 2017-05-27: 0.25 mg via INTRAVENOUS

## 2017-05-27 MED ORDER — SODIUM CHLORIDE 0.9 % IV SOLN
Freq: Once | INTRAVENOUS | Status: AC
  Administered 2017-05-27: 13:00:00 via INTRAVENOUS
  Filled 2017-05-27: qty 5

## 2017-05-27 MED ORDER — CARBOPLATIN CHEMO INJECTION 600 MG/60ML
500.0000 mg | Freq: Once | INTRAVENOUS | Status: AC
  Administered 2017-05-27: 500 mg via INTRAVENOUS
  Filled 2017-05-27: qty 50

## 2017-05-27 MED ORDER — DIPHENHYDRAMINE HCL 50 MG/ML IJ SOLN
50.0000 mg | Freq: Once | INTRAMUSCULAR | Status: AC
  Administered 2017-05-27: 50 mg via INTRAVENOUS

## 2017-05-27 NOTE — Progress Notes (Signed)
[place] OFFICE PROGRESS NOTE  Patient Care Team: Shirline Frees, MD as PCP - General (Family Medicine)  ASSESSMENT & PLAN:  Endometrial cancer [facility]) She tolerated cycle 1 of chemotherapy poorly due to peripheral neuropathy For cycle 2 onwards, the dose of Taxol to 50% From cycle 4 onwards, the dose of carboplatin was reduced due to pancytopenia She has progressive pancytopenia and I plan to further reduce dose of carboplatin With her morbid obesity, the calculated dose of chemotherapy is still quite high I told her that the dose adjustment will not compromised to benefit of her treatment  Pancytopenia, acquired ([place]) We will proceed with treatment with dose adjustment She is not symptomatic from anemia or thrombocytopenia I educated her to monitor for signs of bleeding  Chemotherapy-induced neuropathy ([place]) She has worsening neuropathy but is not able to tolerate gabapentin at higher dose Recommend reducing the dose of carboplatin and Taxol and she agreed She will continue on gabapentin for now.  Steroid-induced hyperglycemia She has mild hyperglycemia secondary to steroid therapy Monitor closely   No orders of the defined types were placed in this encounter.   INTERVAL HISTORY: Please see below for problem oriented charting. She is seen prior to cycle 5 of chemotherapy Her peripheral neuropathy has been stable since dose reduction of chemotherapy She is doing well on gabapentin She denies nausea, vomiting, changes in bowel habits or bloating She denies recent infection The patient denies any recent signs or symptoms of bleeding such as spontaneous epistaxis, hematuria or hematochezia.   SUMMARY OF ONCOLOGIC HISTORY: Oncology History   Recurrent endometrial cancer, strong ER positive 90%, PR 90%, MSI stable Clear cell mixed features of ovaries in 2015 Recurrent disease 2018: low grade endometroid       Endometrial cancer ([place])   02/24/2013  Miscellaneous    Pulmonary embolus  IVC filter was placed and she was started on Lovenox.  She was subsequently transitioned to warfarin.       03/07/2013 Pathology Results    1. Cervix, biopsy - HIGH GRADE ENDOMETRIAL CARCINOMA, SEE COMMENT. 2. Endometrium, biopsy - HIGH GRADE ENDOMETRIAL CARCINOMA, SEE COMMENT. Microscopic Comment 1. , 2. In both parts 1 and 2, slide sections demonstrate extensive involvement by high grade endometrial carcinoma demonstrating a biphasic epithelial and stromal architecture. The immunophenotype (strong diffuse estrogen receptor; strong diffuse vimentin expression; patchy monoclonal CEA expression, patchy p16 immunostain expression, and diffuse mild to moderate p53 expression) is consistent with primary endometrial origin. 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       03/14/2013 - 01/04/2014 Chemotherapy    taxol carboplatin x 9      08/01/2013 Surgery    Procedure(s) Performed: 1. Exploratory laparotomy with total hysterectomy bilateral salpingo-oophorectomy, omental biopsies.  Surgeon: Francetta Found.  Skeet Latch, MD., PhD  Assistant Surgeon: Lahoma Crocker M.D.   Specimens: Uterus Cervix, Bilateral tubes / ovaries, lower  omentum.   Operative Findings: A 24cm left ovarian mass hemosiderine filled with omental adhesions.  Evidence of chemotherapeutic changes over the bowel.  Uterus 14 cm.  No palpable hepatic mets but the examination was limited by diaphragmatic adhesions.  No gross disease in the omentum. TAH BSO omentectomy Left ovary 20cm clear cell malignancy  Uterus Carcinosarcoma  Multiple microscopic foci, measuring up to 0.5 cm in greatest dimension. Histologic type: Carcinosarcoma (MMMT) with heterologous component (ossification) Grade: High grade Myometrial invasion: 2.1  cm where myometrium is 2.5  cm in thickness Cervical stromal involvement: No.      08/01/2013 Pathology Results    1. Adnexa - ovary +/- tube, neoplastic, left with omentum - HIGH GRADE CARCINOMA WITH BOTH CLEAR CELL CARCINOMA AND ENDOMETRIOID CARCINOMA COMPONENTS, 20 CM. PLEASE SEE ONCOLOGY TEMPLATE FOR DETAIL. 2. Uterus and cervix, with right fallopian tube and right ovary - MICROSCOPIC FOCI OF RESIDUAL CARCINOSARCOMA WITH HETEROLOGOUS ELEMENT, INVOLVING OUTER HALF OF THE MYOMETRIUM. - EXTENSIVE ADENOMYOSIS AND LEIOMYOMA. - CERVIX: BENIGN SQUAMOUS MUCOSA AND ENDOCERVICAL MUCOSA, NO DYSPLASIA OR MALIGNANCY. - RIGHT OVARY AND FALLOPIAN TUBES: NO PATHOLOGIC ABNORMALITIES. Microscopic Comment  1. OVARY Specimen(s): Left ovary and fallopian tube. Procedure: (including lymph node sampling): Left salpingo-oophorectomy Primary tumor site (including laterality): Left ovary Ovarian surface involvement: Present. Ovarian capsule intact without fragmentation: No. Maximum tumor size (cm): 20 cm, gross measurement. Histologic type: Mixed surface epithelial carcinoma with predominant clear cell carcinoma component (70%) and minor endometrioid component (30%) Please see comment for detail. Grade: High grade Peritoneal implants: (specify invasive or non-invasive): N/A Pelvic extension (list additional structures on separate lines and if involved): No. Lymph nodes: number examined - N/A ; number positive - N/A TNM code: ypT1c2, pNX FIGO Stage (based on pathologic findings, needs clinical correlation): at least IC2 Comments: Received grossly is a 20 cm focally disrupted and partially collapsed multicystic ovary with attached/adherent omentum. Microscopically, sectioned show an invasive high grade surface carcinoma arising in a background of endometriotic cyst. The tumor is predominately composed of clear cell carcinoma component with minor endometrioid carcinoma component. No tumor involvement is identified in the adherent  omentum tissue. Immunohistochemical stains were performed and the clear cell carcinoma is patchy positive for p16, ER, p53, negative for WT-1 and p63. Focal angiolymphatic invasion is present  The morphologic features are different from those seen in the uterus. 2. ONCOLOGY TABLE-UTERUS, CARCINOSARCOMA Specimen: Uterus, cervix and right fallopian tubes and ovaries. Procedure: Total hysterectomy and right salpingo-oophorectomy. Lymph node sampling performed: No. Specimen integrity: Intact. Maximum tumor size: Multiple microscopic foci, measuring up to 0.5 cm in greatest dimension. Histologic type: Carcinosarcoma (MMMT) with heterologous component (ossification) Grade: High grade Myometrial invasion: 2.1 cm where myometrium is 2.5 cm in thickness Cervical stromal involvement: No. Extent of involvement of other organs: No. Lymph - vascular invasion: Not identified. Peritoneal washings: N/A Lymph nodes: # examined N/A ; # positive N/A Pelvic lymph nodes: N/A involved of N/A lymph nodes. Para-aortic lymph nodes: N/A involved of N/A lymph nodes. Other (specify involvement and site): N/A TNM code: ypT1b, ypNX FIGO Stage (based on pathologic findings, needs clinical correlation): IB Comment: Immunohistochemical stains were performed and the residual carcinoma is positive for p16, negative for WT-1, weakly positive for p53 and ER. The morphologic features are different from those seen in the left ovary. (HCL:gt, 08/04/13)      02/21/2014 - 03/09/2014 Radiation Therapy    Received vaginal brachytherapy TREATMENT DATES: 12/2, 12/7, 12/11, 12/14 and 03/09/14  SITE/DOSE: Vaginal Cuff 4 cm treatment length / 30 Gy in 5 fractions  Technique: HDR with Ir-192       12/17/2016 Relapse/Recurrence    2cm rubbery like mass in the RV septum on physical examination      12/23/2016 Imaging    Reproductive: Previous hysterectomy. New soft  tissue mass seen between the vaginal cuff and anterior rectal wall which measures 2.7 x 2.6 cm on image 112/ 2. This is consistent with locally recurrent endometrial carcinoma.  01/05/2017 Imaging    1. Previously noted pelvic mass is similar in size and appearance to the prior examination from 12/23/2016 demonstrating some low-level hypermetabolism. This is nonspecific, and could represent a lesion of rectal, cervical or uterine origin. 2. No definite findings to suggest metastatic disease in the chest, abdomen or pelvis. 3. Small focus of hypermetabolism in the distal third of the esophagus without a perceivable abnormality on the CT portion of the examination. This could represent physiologic activity, a focal area of esophagitis, or an underlying lesion. Clinical correlation is recommended, with consideration for followup upper endoscopy if clinically appropriate. 4. Nonspecific low-level hypermetabolism in a left axillary lymph node which is stable in size and appearance compared to prior examinations. This is favored to be benign, but correlation with routine annual mammography is recommended. 5. Dilatation of the pulmonic trunk (4 cm in diameter), concerning for pulmonary arterial hypertension. 6. Severe hepatic steatosis.      01/05/2017 PET scan    1. Previously noted pelvic mass is similar in size and appearance to the prior examination from 12/23/2016 demonstrating some low-level hypermetabolism. This is nonspecific, and could represent a lesion of rectal, cervical or uterine origin. 2. No definite findings to suggest metastatic disease in the chest, abdomen or pelvis. 3. Small focus of hypermetabolism in the distal third of the esophagus without a perceivable abnormality on the CT portion of the examination. This could represent physiologic activity, a focal area of esophagitis, or an underlying lesion. Clinical correlation is recommended, with consideration for  followup upper endoscopy if clinically appropriate. 4. Nonspecific low-level hypermetabolism in a left axillary lymph node which is stable in size and appearance compared to prior examinations. This is favored to be benign, but correlation with routine annual mammography is recommended. 5. Dilatation of the pulmonic trunk (4 cm in diameter), concerning for pulmonary arterial hypertension. 6. Severe hepatic steatosis. 7. Aortic atherosclerosis, in addition to left anterior descending coronary artery disease. Please note that although the presence of coronary artery calcium documents the presence of coronary artery disease, the severity of this disease and any potential stenosis cannot be assessed on this non-gated CT examination. Assessment for potential risk factor modification, dietary therapy or pharmacologic therapy may be warranted, if clinically indicated. 8. Additional incidental findings, as above. Aortic Atherosclerosis (ICD10-I70.0).      01/27/2017 Surgery    She underwent diagnostic laparoscopy, robotic assisted resection of intraperitoneal mass, infracolic omentectomy, peritoneal biopsies and rigid proctoscopy. R1 resection with miliary disease remaining in tumor bed      02/04/2017 Pathology Results    A: Cul-de-sac mass, biopsy  - Endometrioid adenocarcinoma, FIGO grade 1 (architecture grade 1, nuclear grade 2) (see comment)  B: Peritoneum, pelvic, biopsy  - Fragments of fibrovascular tissue with hemorrhage - No malignancy identified  C: Cul-de-sac, biopsy  - Endometrioid adenocarcinoma, FIGO grade 1 (architecture grade 1, nuclear grade 2), with squamous differentiation (see comment)  D: Omentum, omentectomy  - Benign omentum  - No malignancy identified  The purpose of this addendum is to report the results of immunohistochemical stains for mismatch repair proteins and hormone receptors performed on a representative block of tumor.  Immunostains for mismatch  repair proteins were performed on block C2 and show intact expression of MLH1, PMS2, MSH2, and MSH6 within the tumor. This is the normal phenotype and does NOT support a diagnosis of microsatellite instability/hereditary non-polyposis colorectal cancer (HNPCC) associated carcinoma. Molecular testing for additional microsatellite instability markers will be performed by the [facility] [phone]) and reported separately.  Estrogen receptor (Ventana, clone SP1):  Positive (90%, weak to moderate)  Progesterone receptor (Ventana, clone 1E2):  Positive (90%, strong)      02/16/2017 Procedure    Successful placement of a right internal jugular approach power injectable Port-A-Cath. The catheter is ready for immediate use.      03/04/2017 -  Chemotherapy    The patient had Carboplatin and Taxol for chemotherapy treatment.         Ovarian cancer ([place])   08/01/2013 Initial Diagnosis    Ovarian cancer ([place]) Unstaged.  Incidental pathology finding      2016 Genetic Testing     Results revealed patient has the following mutation(s): NEGATIVE        REVIEW OF SYSTEMS:   Constitutional: Denies fevers, chills or abnormal weight loss Eyes: Denies blurriness of vision Ears, nose, mouth, throat, and face: Denies mucositis or sore throat Respiratory: Denies cough, dyspnea or wheezes Cardiovascular: Denies palpitation, chest discomfort or lower extremity swelling Gastrointestinal:  Denies nausea, heartburn or change in bowel habits Skin: Denies abnormal skin rashes Lymphatics: Denies new lymphadenopathy or easy bruising Neurological:Denies numbness, tingling or new weaknesses Behavioral/Psych: Mood is stable, no new changes  All other systems were reviewed with the patient and are negative.  I have reviewed the past medical history, past surgical history, social history and family history with the patient and they are unchanged from previous note.  ALLERGIES:  has No  Known Allergies.  MEDICATIONS:  Current Outpatient Medications  Medication Sig Dispense Refill  . aspirin EC 81 MG tablet Take 81 mg by mouth daily.    . cetirizine (ZYRTEC) 10 MG tablet Take 10 mg by mouth daily.    Marland Kitchen dexamethasone (DECADRON) 4 MG tablet Take 5 tablets the day before chemo and 5 tablets the morning of chemotherapy with food every 21 days 60 tablet 1  . gabapentin (NEURONTIN) 300 MG capsule Take 1 capsule (300 mg total) by mouth 2 (two) times daily. 90 capsule 11  . lidocaine-prilocaine (EMLA) cream Apply to affected area once 30 g 3  . magic mouthwash w/lidocaine SOLN Take 5 mLs by mouth 4 (four) times daily as needed for mouth pain. 400 mL 0  . ondansetron (ZOFRAN) 8 MG tablet Take 1 tablet (8 mg total) by mouth 2 (two) times daily as needed for refractory nausea / vomiting. Start on day 3 after chemo. 30 tablet 1  . prochlorperazine (COMPAZINE) 10 MG tablet Take 1 tablet (10 mg total) by mouth every 6 (six) hours as needed (Nausea or vomiting). 30 tablet 1  . traMADol (ULTRAM) 50 MG tablet Take 1 tablet (50 mg total) by mouth every 6 (six) hours as needed. 90 tablet 1   No current facility-administered medications for this visit.    Facility-Administered Medications Ordered in Other Visits  Medication Dose Route Frequency Provider Last Rate Last Dose  . CARBOplatin (PARAPLATIN) 500 mg in sodium chloride 0.9 % 250 mL chemo infusion  500 mg Intravenous Once Alvy Bimler, Ni, MD      . heparin lock flush 100 unit/mL  500 Units Intracatheter Once PRN Alvy Bimler, Ni, MD      . PACLitaxel (TAXOL) 258 mg in sodium chloride 0.9 % 250 mL chemo infusion (> 71m/m2)  105 mg/m2 (Treatment Plan Recorded) Intravenous Once GAlvy Bimler Ni, MD 98 mL/hr at 05/27/17 1353 258 mg at 05/27/17 1353  . sodium chloride flush (NS) 0.9 % injection 10 mL  10 mL Intracatheter PRN GAlvy Bimler  Ni, MD        PHYSICAL EXAMINATION: ECOG PERFORMANCE STATUS: 1 - Symptomatic but completely ambulatory  Vitals:    05/27/17 1116  BP: (!) 151/90  Pulse: 89  Resp: 18  Temp: 97.7 F (36.5 C)  SpO2: 100%   Filed Weights   05/27/17 1116  Weight: 278 lb 6.4 oz (126.3 kg)    GENERAL:alert, no distress and comfortable SKIN: skin color, texture, turgor are normal, no rashes or significant lesions EYES: normal, Conjunctiva are pink and non-injected, sclera clear OROPHARYNX:no exudate, no erythema and lips, buccal mucosa, and tongue normal  NECK: supple, thyroid normal size, non-tender, without nodularity LYMPH:  no palpable lymphadenopathy in the cervical, axillary or inguinal LUNGS: clear to auscultation and percussion with normal breathing effort HEART: regular rate & rhythm and no murmurs and no lower extremity edema ABDOMEN:abdomen soft, non-tender and normal bowel sounds Musculoskeletal:no cyanosis of digits and no clubbing  NEURO: alert & oriented x 3 with fluent speech, no focal motor/sensory deficits  LABORATORY DATA:  I have reviewed the data as listed    Component Value Date/Time   NA 140 05/27/2017 1100   NA 137 03/25/2017 0956   K 3.7 05/27/2017 1100   K 4.1 03/25/2017 0956   CL 105 05/27/2017 1100   CO2 25 05/27/2017 1100   CO2 25 03/25/2017 0956   GLUCOSE 179 (H) 05/27/2017 1100   GLUCOSE 180 (H) 03/25/2017 0956   BUN 12 05/27/2017 1100   BUN 17.3 03/25/2017 0956   CREATININE 0.99 05/27/2017 1100   CREATININE 1.1 03/25/2017 0956   CALCIUM 9.1 05/27/2017 1100   CALCIUM 9.4 03/25/2017 0956   PROT 8.9 (H) 05/27/2017 1100   PROT 9.1 (H) 03/25/2017 0956   ALBUMIN 3.5 05/27/2017 1100   ALBUMIN 3.6 03/25/2017 0956   AST 24 05/27/2017 1100   AST 22 03/25/2017 0956   ALT 24 05/27/2017 1100   ALT 24 03/25/2017 0956   ALKPHOS 102 05/27/2017 1100   ALKPHOS 100 03/25/2017 0956   BILITOT 0.5 05/27/2017 1100   BILITOT 0.37 03/25/2017 0956   GFRNONAA >60 05/27/2017 1100   GFRAA >60 05/27/2017 1100    No results found for: SPEP, UPEP  Lab Results  Component Value Date   WBC  3.3 (L) 05/27/2017   NEUTROABS 2.9 05/27/2017   HGB 9.4 (L) 05/27/2017   HCT 28.2 (L) 05/27/2017   MCV 94.9 05/27/2017   PLT 89 (L) 05/27/2017      Chemistry      Component Value Date/Time   NA 140 05/27/2017 1100   NA 137 03/25/2017 0956   K 3.7 05/27/2017 1100   K 4.1 03/25/2017 0956   CL 105 05/27/2017 1100   CO2 25 05/27/2017 1100   CO2 25 03/25/2017 0956   BUN 12 05/27/2017 1100   BUN 17.3 03/25/2017 0956   CREATININE 0.99 05/27/2017 1100   CREATININE 1.1 03/25/2017 0956      Component Value Date/Time   CALCIUM 9.1 05/27/2017 1100   CALCIUM 9.4 03/25/2017 0956   ALKPHOS 102 05/27/2017 1100   ALKPHOS 100 03/25/2017 0956   AST 24 05/27/2017 1100   AST 22 03/25/2017 0956   ALT 24 05/27/2017 1100   ALT 24 03/25/2017 0956   BILITOT 0.5 05/27/2017 1100   BILITOT 0.37 03/25/2017 0956       All questions were answered. The patient knows to call the clinic with any problems, questions or concerns. No barriers to learning was detected.  I spent 25  minutes counseling the patient face to face. The total time spent in the appointment was 30 minutes and more than 50% was on counseling and review of test results  Heath Lark, MD 05/27/2017 4:03 PM

## 2017-05-27 NOTE — Assessment & Plan Note (Signed)
She tolerated cycle 1 of chemotherapy poorly due to peripheral neuropathy For cycle 2 onwards, the dose of Taxol to 50% From cycle 4 onwards, the dose of carboplatin was reduced due to pancytopenia She has progressive pancytopenia and I plan to further reduce dose of carboplatin With her morbid obesity, the calculated dose of chemotherapy is still quite high I told her that the dose adjustment will not compromised to benefit of her treatment

## 2017-05-27 NOTE — Patient Instructions (Signed)
[facility] Discharge Instructions for Patients Receiving Chemotherapy  Today you received the following chemotherapy agents:  Taxol, Carboplatin  To help prevent nausea and vomiting after your treatment, we encourage you to take your nausea medication as prescribed.   If you develop nausea and vomiting that is not controlled by your nausea medication, call the clinic.   BELOW ARE SYMPTOMS THAT SHOULD BE REPORTED IMMEDIATELY:  *FEVER GREATER THAN 100.5 F  *CHILLS WITH OR WITHOUT FEVER  NAUSEA AND VOMITING THAT IS NOT CONTROLLED WITH YOUR NAUSEA MEDICATION  *UNUSUAL SHORTNESS OF BREATH  *UNUSUAL BRUISING OR BLEEDING  TENDERNESS IN MOUTH AND THROAT WITH OR WITHOUT PRESENCE OF ULCERS  *URINARY PROBLEMS  *BOWEL PROBLEMS  UNUSUAL RASH Items with * indicate a potential emergency and should be followed up as soon as possible.  Feel free to call the clinic should you have any questions or concerns. The clinic phone number is [phone].  Please show the CHEMO ALERT CARD at check-in to the Emergency Department and triage nurse.   

## 2017-05-27 NOTE — Assessment & Plan Note (Signed)
She has mild hyperglycemia secondary to steroid therapy Monitor closely

## 2017-05-27 NOTE — Assessment & Plan Note (Signed)
We will proceed with treatment with dose adjustment She is not symptomatic from anemia or thrombocytopenia I educated her to monitor for signs of bleeding

## 2017-05-27 NOTE — Assessment & Plan Note (Signed)
She has worsening neuropathy but is not able to tolerate gabapentin at higher dose Recommend reducing the dose of carboplatin and Taxol and she agreed She will continue on gabapentin for now.

## 2017-06-04 DIAGNOSIS — E440 Moderate protein-calorie malnutrition: Secondary | ICD-10-CM | POA: Diagnosis not present

## 2017-06-04 DIAGNOSIS — C787 Secondary malignant neoplasm of liver and intrahepatic bile duct: Secondary | ICD-10-CM | POA: Diagnosis not present

## 2017-06-04 DIAGNOSIS — D500 Iron deficiency anemia secondary to blood loss (chronic): Secondary | ICD-10-CM

## 2017-06-04 DIAGNOSIS — Z79899 Other long term (current) drug therapy: Secondary | ICD-10-CM

## 2017-06-04 DIAGNOSIS — C541 Malignant neoplasm of endometrium: Secondary | ICD-10-CM

## 2017-06-17 ENCOUNTER — Inpatient Hospital Stay (HOSPITAL_BASED_OUTPATIENT_CLINIC_OR_DEPARTMENT_OTHER): Payer: BLUE CROSS/BLUE SHIELD | Admitting: Hematology and Oncology

## 2017-06-17 ENCOUNTER — Telehealth: Payer: Self-pay | Admitting: Hematology and Oncology

## 2017-06-17 ENCOUNTER — Inpatient Hospital Stay: Payer: BLUE CROSS/BLUE SHIELD

## 2017-06-17 ENCOUNTER — Encounter: Payer: Self-pay | Admitting: Hematology and Oncology

## 2017-06-17 DIAGNOSIS — D61818 Other pancytopenia: Secondary | ICD-10-CM

## 2017-06-17 DIAGNOSIS — C541 Malignant neoplasm of endometrium: Secondary | ICD-10-CM

## 2017-06-17 DIAGNOSIS — I10 Essential (primary) hypertension: Secondary | ICD-10-CM

## 2017-06-17 DIAGNOSIS — C569 Malignant neoplasm of unspecified ovary: Secondary | ICD-10-CM

## 2017-06-17 DIAGNOSIS — G620 Drug-induced polyneuropathy: Secondary | ICD-10-CM

## 2017-06-17 DIAGNOSIS — T451X5A Adverse effect of antineoplastic and immunosuppressive drugs, initial encounter: Secondary | ICD-10-CM

## 2017-06-17 LAB — COMPREHENSIVE METABOLIC PANEL
ALBUMIN: 3.7 g/dL (ref 3.5–5.0)
ALK PHOS: 95 U/L (ref 40–150)
ALT: 17 U/L (ref 0–55)
ANION GAP: 9 (ref 3–11)
AST: 20 U/L (ref 5–34)
BUN: 18 mg/dL (ref 7–26)
CO2: 26 mmol/L (ref 22–29)
Calcium: 10 mg/dL (ref 8.4–10.4)
Chloride: 104 mmol/L (ref 98–109)
Creatinine, Ser: 1.01 mg/dL (ref 0.60–1.10)
GFR calc Af Amer: >60 mL/min (ref >60)
GFR calc non Af Amer: >60 mL/min (ref >60)
GLUCOSE: 120 mg/dL (ref 70–140)
POTASSIUM: 3.8 mmol/L (ref 3.5–5.1)
SODIUM: 139 mmol/L (ref 136–145)
Total Bilirubin: 0.5 mg/dL (ref 0.2–1.2)
Total Protein: 8.8 g/dL — ABNORMAL HIGH (ref 6.4–8.3)

## 2017-06-17 LAB — CBC WITH DIFFERENTIAL/PLATELET
BASOS ABS: 0 10*3/uL (ref 0.0–0.1)
Basophils Relative: 0 %
EOS PCT: 0 %
Eosinophils Absolute: 0 10*3/uL (ref 0.0–0.5)
HEMATOCRIT: 26.4 % — AB (ref 34.8–46.6)
Hemoglobin: 8.9 g/dL — ABNORMAL LOW (ref 11.6–15.9)
LYMPHS PCT: 14 %
Lymphs Abs: 0.6 10*3/uL — ABNORMAL LOW (ref 0.9–3.3)
MCH: 32.2 pg (ref 25.1–34.0)
MCHC: 33.9 g/dL (ref 31.5–36.0)
MCV: 95.2 fL (ref 79.5–101.0)
MONO ABS: 0.3 10*3/uL (ref 0.1–0.9)
MONOS PCT: 8 %
NEUTROS ABS: 3.1 10*3/uL (ref 1.5–6.5)
Neutrophils Relative %: 78 %
PLATELETS: 93 10*3/uL — AB (ref 145–400)
RBC: 2.77 MIL/uL — ABNORMAL LOW (ref 3.70–5.45)
RDW: 20.1 % — ABNORMAL HIGH (ref 11.2–14.5)
WBC: 3.9 10*3/uL (ref 3.9–10.3)

## 2017-06-17 MED ORDER — GABAPENTIN 300 MG PO CAPS: 300.0000 mg | ORAL_CAPSULE | Freq: Two times a day (BID) | ORAL | 11 refills | Status: DC

## 2017-06-17 MED ORDER — SODIUM CHLORIDE 0.9% FLUSH
10.0000 mL | Freq: Once | INTRAVENOUS | Status: AC
  Administered 2017-06-17: 10 mL
  Filled 2017-06-17: qty 10

## 2017-06-17 MED ORDER — SODIUM CHLORIDE 0.9% FLUSH
10.0000 mL | INTRAVENOUS | Status: DC | PRN
  Administered 2017-06-17: 10 mL
  Filled 2017-06-17: qty 10

## 2017-06-17 MED ORDER — SODIUM CHLORIDE 0.9 % IV SOLN
105.0000 mg/m2 | Freq: Once | INTRAVENOUS | Status: AC
  Administered 2017-06-17: 258 mg via INTRAVENOUS
  Filled 2017-06-17: qty 43

## 2017-06-17 MED ORDER — PALONOSETRON HCL INJECTION 0.25 MG/5ML
0.2500 mg | Freq: Once | INTRAVENOUS | Status: AC
  Administered 2017-06-17: 0.25 mg via INTRAVENOUS

## 2017-06-17 MED ORDER — FAMOTIDINE IN NACL 20-0.9 MG/50ML-% IV SOLN
INTRAVENOUS | Status: AC
  Filled 2017-06-17: qty 50

## 2017-06-17 MED ORDER — SODIUM CHLORIDE 0.9 % IV SOLN
500.0000 mg | Freq: Once | INTRAVENOUS | Status: AC
  Administered 2017-06-17: 500 mg via INTRAVENOUS
  Filled 2017-06-17: qty 50

## 2017-06-17 MED ORDER — FAMOTIDINE IN NACL 20-0.9 MG/50ML-% IV SOLN
20.0000 mg | Freq: Once | INTRAVENOUS | Status: AC
  Administered 2017-06-17: 20 mg via INTRAVENOUS

## 2017-06-17 MED ORDER — PALONOSETRON HCL INJECTION 0.25 MG/5ML
INTRAVENOUS | Status: AC
  Filled 2017-06-17: qty 5

## 2017-06-17 MED ORDER — SODIUM CHLORIDE 0.9 % IV SOLN
Freq: Once | INTRAVENOUS | Status: AC
  Administered 2017-06-17: 09:00:00 via INTRAVENOUS

## 2017-06-17 MED ORDER — TRAMADOL HCL 50 MG PO TABS: 50.0000 mg | ORAL_TABLET | Freq: Four times a day (QID) | ORAL | 0 refills | Status: DC | PRN

## 2017-06-17 MED ORDER — FOSAPREPITANT DIMEGLUMINE INJECTION 150 MG
Freq: Once | INTRAVENOUS | Status: AC
  Administered 2017-06-17: 10:00:00 via INTRAVENOUS
  Filled 2017-06-17: qty 5

## 2017-06-17 MED ORDER — HEPARIN SOD (PORK) LOCK FLUSH 100 UNIT/ML IV SOLN
500.0000 [IU] | Freq: Once | INTRAVENOUS | Status: AC | PRN
Start: 2017-06-17 — End: 2017-06-17
  Administered 2017-06-17: 500 [IU]
  Filled 2017-06-17: qty 5

## 2017-06-17 MED ORDER — DIPHENHYDRAMINE HCL 50 MG/ML IJ SOLN
50.0000 mg | Freq: Once | INTRAMUSCULAR | Status: AC
  Administered 2017-06-17: 50 mg via INTRAVENOUS

## 2017-06-17 MED ORDER — DIPHENHYDRAMINE HCL 50 MG/ML IJ SOLN
INTRAMUSCULAR | Status: AC
  Filled 2017-06-17: qty 1

## 2017-06-17 NOTE — Assessment & Plan Note (Signed)
She tolerated cycle 1 of chemotherapy poorly due to peripheral neuropathy For cycle 2 onwards, the dose of Taxol to 50% From cycle 4 onwards, the dose of carboplatin was reduced due to pancytopenia She has stable pancytopenia but not symptomatic We would proceed with final treatment today with similar dose adjustment I plan to see her back in May for further follow-up

## 2017-06-17 NOTE — Progress Notes (Signed)
[facility] OFFICE PROGRESS NOTE  Patient Care Team: Shirline Frees, MD as PCP - General (Family Medicine)  ASSESSMENT & PLAN:  Endometrial cancer [facility] She tolerated cycle 1 of chemotherapy poorly due to peripheral neuropathy For cycle 2 onwards, the dose of Taxol to 50% From cycle 4 onwards, the dose of carboplatin was reduced due to pancytopenia She has stable pancytopenia but not symptomatic We would proceed with final treatment today with similar dose adjustment I plan to see her back in May for further follow-up  Chemotherapy-induced neuropathy ([place]) She has worsening neuropathy but is not able to tolerate gabapentin at higher dose Recent reduced dose chemotherapy has helped Her neuropathy is stable and not progressive She will continue on gabapentin for now.  HTN (hypertension) Her blood pressure is significantly elevated but the patient attributed that to anxiety We will monitor her blood pressure carefully   Pancytopenia, acquired ([place]) We will proceed with treatment with dose adjustment She is not symptomatic from anemia or thrombocytopenia I educated her to monitor for signs of bleeding   No orders of the defined types were placed in this encounter.   INTERVAL HISTORY: Please see below for problem oriented charting. She returns for final chemotherapy She denies recent infection No recent worsening peripheral neuropathy No recent nausea vomiting The patient denies any recent signs or symptoms of bleeding such as spontaneous epistaxis, hematuria or hematochezia.   SUMMARY OF ONCOLOGIC HISTORY: Oncology History   Recurrent endometrial cancer, strong ER positive 90%, PR 90%, MSI stable Clear cell mixed features of ovaries in 2015 Recurrent disease 2018: low grade endometroid       Endometrial cancer ([place])   02/24/2013 Miscellaneous    Pulmonary embolus  IVC filter was placed and she was started on Lovenox.  She was subsequently transitioned  to warfarin.       03/07/2013 Pathology Results    1. Cervix, biopsy - HIGH GRADE ENDOMETRIAL CARCINOMA, SEE COMMENT. 2. Endometrium, biopsy - HIGH GRADE ENDOMETRIAL CARCINOMA, SEE COMMENT. Microscopic Comment 1. , 2. In both parts 1 and 2, slide sections demonstrate extensive involvement by high grade endometrial carcinoma demonstrating a biphasic epithelial and stromal architecture. The immunophenotype (strong diffuse estrogen receptor; strong diffuse vimentin expression; patchy monoclonal CEA expression, patchy p16 immunostain expression, and diffuse mild to moderate p53 expression) is consistent with primary endometrial origin. 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       03/14/2013 - 01/04/2014 Chemotherapy    taxol carboplatin x 9      08/01/2013 Surgery    Procedure(s) Performed: 1. Exploratory laparotomy with total hysterectomy bilateral salpingo-oophorectomy, omental biopsies.  Surgeon: Francetta Found.  Skeet Latch, MD., PhD  Assistant Surgeon: Lahoma Crocker M.D.   Specimens: Uterus Cervix, Bilateral tubes / ovaries, lower  omentum.   Operative Findings: A 24cm left ovarian mass hemosiderine filled with omental adhesions.  Evidence of chemotherapeutic changes over the bowel.  Uterus 14 cm.  No palpable hepatic mets but the examination was limited by diaphragmatic adhesions.  No gross disease in the omentum. TAH BSO omentectomy Left ovary 20cm clear cell malignancy  Uterus Carcinosarcoma  Multiple microscopic foci, measuring up to 0.5 cm in greatest dimension. Histologic type: Carcinosarcoma (MMMT) with heterologous component (ossification) Grade: High grade Myometrial invasion: 2.1 cm where myometrium is 2.5 cm in thickness Cervical stromal involvement: No.      08/01/2013 Pathology Results  1. Adnexa - ovary +/- tube,  neoplastic, left with omentum - HIGH GRADE CARCINOMA WITH BOTH CLEAR CELL CARCINOMA AND ENDOMETRIOID CARCINOMA COMPONENTS, 20 CM. PLEASE SEE ONCOLOGY TEMPLATE FOR DETAIL. 2. Uterus and cervix, with right fallopian tube and right ovary - MICROSCOPIC FOCI OF RESIDUAL CARCINOSARCOMA WITH HETEROLOGOUS ELEMENT, INVOLVING OUTER HALF OF THE MYOMETRIUM. - EXTENSIVE ADENOMYOSIS AND LEIOMYOMA. - CERVIX: BENIGN SQUAMOUS MUCOSA AND ENDOCERVICAL MUCOSA, NO DYSPLASIA OR MALIGNANCY. - RIGHT OVARY AND FALLOPIAN TUBES: NO PATHOLOGIC ABNORMALITIES. Microscopic Comment  1. OVARY Specimen(s): Left ovary and fallopian tube. Procedure: (including lymph node sampling): Left salpingo-oophorectomy Primary tumor site (including laterality): Left ovary Ovarian surface involvement: Present. Ovarian capsule intact without fragmentation: No. Maximum tumor size (cm): 20 cm, gross measurement. Histologic type: Mixed surface epithelial carcinoma with predominant clear cell carcinoma component (70%) and minor endometrioid component (30%) Please see comment for detail. Grade: High grade Peritoneal implants: (specify invasive or non-invasive): N/A Pelvic extension (list additional structures on separate lines and if involved): No. Lymph nodes: number examined - N/A ; number positive - N/A TNM code: ypT1c2, pNX FIGO Stage (based on pathologic findings, needs clinical correlation): at least IC2 Comments: Received grossly is a 20 cm focally disrupted and partially collapsed multicystic ovary with attached/adherent omentum. Microscopically, sectioned show an invasive high grade surface carcinoma arising in a background of endometriotic cyst. The tumor is predominately composed of clear cell carcinoma component with minor endometrioid carcinoma component. No tumor involvement is identified in the adherent omentum tissue. Immunohistochemical stains were performed and the clear cell carcinoma is patchy positive for p16, ER, p53,  negative for WT-1 and p63. Focal angiolymphatic invasion is present  The morphologic features are different from those seen in the uterus. 2. ONCOLOGY TABLE-UTERUS, CARCINOSARCOMA Specimen: Uterus, cervix and right fallopian tubes and ovaries. Procedure: Total hysterectomy and right salpingo-oophorectomy. Lymph node sampling performed: No. Specimen integrity: Intact. Maximum tumor size: Multiple microscopic foci, measuring up to 0.5 cm in greatest dimension. Histologic type: Carcinosarcoma (MMMT) with heterologous component (ossification) Grade: High grade Myometrial invasion: 2.1 cm where myometrium is 2.5 cm in thickness Cervical stromal involvement: No. Extent of involvement of other organs: No. Lymph - vascular invasion: Not identified. Peritoneal washings: N/A Lymph nodes: # examined N/A ; # positive N/A Pelvic lymph nodes: N/A involved of N/A lymph nodes. Para-aortic lymph nodes: N/A involved of N/A lymph nodes. Other (specify involvement and site): N/A TNM code: ypT1b, ypNX FIGO Stage (based on pathologic findings, needs clinical correlation): IB Comment: Immunohistochemical stains were performed and the residual carcinoma is positive for p16, negative for WT-1, weakly positive for p53 and ER. The morphologic features are different from those seen in the left ovary. (HCL:gt, 08/04/13)      02/21/2014 - 03/09/2014 Radiation Therapy    Received vaginal brachytherapy TREATMENT DATES: 12/2, 12/7, 12/11, 12/14 and 03/09/14  SITE/DOSE: Vaginal Cuff 4 cm treatment length / 30 Gy in 5 fractions  Technique: HDR with Ir-192       12/17/2016 Relapse/Recurrence    2cm rubbery like mass in the RV septum on physical examination      12/23/2016 Imaging    Reproductive: Previous hysterectomy. New soft tissue mass seen between the vaginal cuff and anterior rectal wall which measures 2.7 x 2.6 cm on image 112/ 2. This is  consistent with locally recurrent endometrial carcinoma.      01/05/2017 Imaging    1. Previously noted pelvic mass is similar in size and appearance to the prior examination from 12/23/2016 demonstrating  some low-level hypermetabolism. This is nonspecific, and could represent a lesion of rectal, cervical or uterine origin. 2. No definite findings to suggest metastatic disease in the chest, abdomen or pelvis. 3. Small focus of hypermetabolism in the distal third of the esophagus without a perceivable abnormality on the CT portion of the examination. This could represent physiologic activity, a focal area of esophagitis, or an underlying lesion. Clinical correlation is recommended, with consideration for followup upper endoscopy if clinically appropriate. 4. Nonspecific low-level hypermetabolism in a left axillary lymph node which is stable in size and appearance compared to prior examinations. This is favored to be benign, but correlation with routine annual mammography is recommended. 5. Dilatation of the pulmonic trunk (4 cm in diameter), concerning for pulmonary arterial hypertension. 6. Severe hepatic steatosis.      01/05/2017 PET scan    1. Previously noted pelvic mass is similar in size and appearance to the prior examination from 12/23/2016 demonstrating some low-level hypermetabolism. This is nonspecific, and could represent a lesion of rectal, cervical or uterine origin. 2. No definite findings to suggest metastatic disease in the chest, abdomen or pelvis. 3. Small focus of hypermetabolism in the distal third of the esophagus without a perceivable abnormality on the CT portion of the examination. This could represent physiologic activity, a focal area of esophagitis, or an underlying lesion. Clinical correlation is recommended, with consideration for followup upper endoscopy if clinically appropriate. 4. Nonspecific low-level hypermetabolism in a left axillary lymph node  which is stable in size and appearance compared to prior examinations. This is favored to be benign, but correlation with routine annual mammography is recommended. 5. Dilatation of the pulmonic trunk (4 cm in diameter), concerning for pulmonary arterial hypertension. 6. Severe hepatic steatosis. 7. Aortic atherosclerosis, in addition to left anterior descending coronary artery disease. Please note that although the presence of coronary artery calcium documents the presence of coronary artery disease, the severity of this disease and any potential stenosis cannot be assessed on this non-gated CT examination. Assessment for potential risk factor modification, dietary therapy or pharmacologic therapy may be warranted, if clinically indicated. 8. Additional incidental findings, as above. Aortic Atherosclerosis (ICD10-I70.0).      01/27/2017 Surgery    She underwent diagnostic laparoscopy, robotic assisted resection of intraperitoneal mass, infracolic omentectomy, peritoneal biopsies and rigid proctoscopy. R1 resection with miliary disease remaining in tumor bed      02/04/2017 Pathology Results    A: Cul-de-sac mass, biopsy  - Endometrioid adenocarcinoma, FIGO grade 1 (architecture grade 1, nuclear grade 2) (see comment)  B: Peritoneum, pelvic, biopsy  - Fragments of fibrovascular tissue with hemorrhage - No malignancy identified  C: Cul-de-sac, biopsy  - Endometrioid adenocarcinoma, FIGO grade 1 (architecture grade 1, nuclear grade 2), with squamous differentiation (see comment)  D: Omentum, omentectomy  - Benign omentum  - No malignancy identified  The purpose of this addendum is to report the results of immunohistochemical stains for mismatch repair proteins and hormone receptors performed on a representative block of tumor.  Immunostains for mismatch repair proteins were performed on block C2 and show intact expression of MLH1, PMS2, MSH2, and MSH6 within the tumor. This is the  normal phenotype and does NOT support a diagnosis of microsatellite instability/hereditary non-polyposis colorectal cancer (HNPCC) associated carcinoma. Molecular testing for additional microsatellite instability markers will be performed by the [employer] [phone]) and reported separately.  Estrogen receptor (Ventana, clone SP1):  Positive (90%, weak to moderate)  Progesterone receptor (Ventana, clone  1E2):  Positive (90%, strong)      02/16/2017 Procedure    Successful placement of a right internal jugular approach power injectable Port-A-Cath. The catheter is ready for immediate use.      03/04/2017 -  Chemotherapy    The patient had Carboplatin and Taxol for chemotherapy treatment.         Ovarian cancer ([place])   08/01/2013 Initial Diagnosis    Ovarian cancer ([place]) Unstaged.  Incidental pathology finding      2016 Genetic Testing     Results revealed patient has the following mutation(s): NEGATIVE        REVIEW OF SYSTEMS:   Constitutional: Denies fevers, chills or abnormal weight loss Eyes: Denies blurriness of vision Ears, nose, mouth, throat, and face: Denies mucositis or sore throat Respiratory: Denies cough, dyspnea or wheezes Cardiovascular: Denies palpitation, chest discomfort or lower extremity swelling Gastrointestinal:  Denies nausea, heartburn or change in bowel habits Skin: Denies abnormal skin rashes Lymphatics: Denies new lymphadenopathy or easy bruising Neurological:Denies numbness, tingling or new weaknesses Behavioral/Psych: Mood is stable, no new changes  All other systems were reviewed with the patient and are negative.  I have reviewed the past medical history, past surgical history, social history and family history with the patient and they are unchanged from previous note.  ALLERGIES:  has No Known Allergies.  MEDICATIONS:  Current Outpatient Medications  Medication Sig Dispense Refill  . aspirin EC 81 MG tablet  Take 81 mg by mouth daily.    . cetirizine (ZYRTEC) 10 MG tablet Take 10 mg by mouth daily.    Marland Kitchen dexamethasone (DECADRON) 4 MG tablet Take 5 tablets the day before chemo and 5 tablets the morning of chemotherapy with food every 21 days 60 tablet 1  . gabapentin (NEURONTIN) 300 MG capsule Take 1 capsule (300 mg total) by mouth 2 (two) times daily. 90 capsule 11  . lidocaine-prilocaine (EMLA) cream Apply to affected area once 30 g 3  . ondansetron (ZOFRAN) 8 MG tablet Take 1 tablet (8 mg total) by mouth 2 (two) times daily as needed for refractory nausea / vomiting. Start on day 3 after chemo. 30 tablet 1  . prochlorperazine (COMPAZINE) 10 MG tablet Take 1 tablet (10 mg total) by mouth every 6 (six) hours as needed (Nausea or vomiting). 30 tablet 1  . traMADol (ULTRAM) 50 MG tablet Take 1 tablet (50 mg total) by mouth every 6 (six) hours as needed. 90 tablet 0   No current facility-administered medications for this visit.    Facility-Administered Medications Ordered in Other Visits  Medication Dose Route Frequency Provider Last Rate Last Dose  . CARBOplatin (PARAPLATIN) 500 mg in sodium chloride 0.9 % 250 mL chemo infusion  500 mg Intravenous Once Alvy Bimler, Angila Wombles, MD      . heparin lock flush 100 unit/mL  500 Units Intracatheter Once PRN Alvy Bimler, Derrell Milanes, MD      . PACLitaxel (TAXOL) 258 mg in sodium chloride 0.9 % 250 mL chemo infusion (> 60m/m2)  105 mg/m2 (Treatment Plan Recorded) Intravenous Once Fabrice Dyal, MD      . sodium chloride flush (NS) 0.9 % injection 10 mL  10 mL Intracatheter PRN GAlvy Bimler Lief Palmatier, MD        PHYSICAL EXAMINATION: ECOG PERFORMANCE STATUS: 1 - Symptomatic but completely ambulatory  Vitals:   06/17/17 0840  BP: (!) 156/101  Pulse: 90  Resp: 18  Temp: 97.8 F (36.6 C)   Filed Weights   06/17/17 0840  Weight:  276 lb 11.2 oz (125.5 kg)    GENERAL:alert, no distress and comfortable SKIN: skin color, texture, turgor are normal, no rashes or significant lesions EYES:  normal, Conjunctiva are pink and non-injected, sclera clear OROPHARYNX:no exudate, no erythema and lips, buccal mucosa, and tongue normal  NECK: supple, thyroid normal size, non-tender, without nodularity LYMPH:  no palpable lymphadenopathy in the cervical, axillary or inguinal LUNGS: clear to auscultation and percussion with normal breathing effort HEART: regular rate & rhythm and no murmurs and no lower extremity edema ABDOMEN:abdomen soft, non-tender and normal bowel sounds Musculoskeletal:no cyanosis of digits and no clubbing  NEURO: alert & oriented x 3 with fluent speech, no focal motor/sensory deficits  LABORATORY DATA:  I have reviewed the data as listed    Component Value Date/Time   NA 139 06/17/2017 0815   NA 137 03/25/2017 0956   K 3.8 06/17/2017 0815   K 4.1 03/25/2017 0956   CL 104 06/17/2017 0815   CO2 26 06/17/2017 0815   CO2 25 03/25/2017 0956   GLUCOSE 120 06/17/2017 0815   GLUCOSE 180 (H) 03/25/2017 0956   BUN 18 06/17/2017 0815   BUN 17.3 03/25/2017 0956   CREATININE 1.01 06/17/2017 0815   CREATININE 1.1 03/25/2017 0956   CALCIUM 10.0 06/17/2017 0815   CALCIUM 9.4 03/25/2017 0956   PROT 8.8 (H) 06/17/2017 0815   PROT 9.1 (H) 03/25/2017 0956   ALBUMIN 3.7 06/17/2017 0815   ALBUMIN 3.6 03/25/2017 0956   AST 20 06/17/2017 0815   AST 22 03/25/2017 0956   ALT 17 06/17/2017 0815   ALT 24 03/25/2017 0956   ALKPHOS 95 06/17/2017 0815   ALKPHOS 100 03/25/2017 0956   BILITOT 0.5 06/17/2017 0815   BILITOT 0.37 03/25/2017 0956   GFRNONAA >60 06/17/2017 0815   GFRAA >60 06/17/2017 0815    No results found for: SPEP, UPEP  Lab Results  Component Value Date   WBC 3.9 06/17/2017   NEUTROABS 3.1 06/17/2017   HGB 8.9 (L) 06/17/2017   HCT 26.4 (L) 06/17/2017   MCV 95.2 06/17/2017   PLT 93 (L) 06/17/2017      Chemistry      Component Value Date/Time   NA 139 06/17/2017 0815   NA 137 03/25/2017 0956   K 3.8 06/17/2017 0815   K 4.1 03/25/2017 0956   CL  104 06/17/2017 0815   CO2 26 06/17/2017 0815   CO2 25 03/25/2017 0956   BUN 18 06/17/2017 0815   BUN 17.3 03/25/2017 0956   CREATININE 1.01 06/17/2017 0815   CREATININE 1.1 03/25/2017 0956      Component Value Date/Time   CALCIUM 10.0 06/17/2017 0815   CALCIUM 9.4 03/25/2017 0956   ALKPHOS 95 06/17/2017 0815   ALKPHOS 100 03/25/2017 0956   AST 20 06/17/2017 0815   AST 22 03/25/2017 0956   ALT 17 06/17/2017 0815   ALT 24 03/25/2017 0956   BILITOT 0.5 06/17/2017 0815   BILITOT 0.37 03/25/2017 0956      All questions were answered. The patient knows to call the clinic with any problems, questions or concerns. No barriers to learning was detected.  I spent 15 minutes counseling the patient face to face. The total time spent in the appointment was 20 minutes and more than 50% was on counseling and review of test results  Heath Lark, MD 06/17/2017 11:03 AM

## 2017-06-17 NOTE — Telephone Encounter (Signed)
Gave patient AVs and calendar of upcoming may appointments.  °

## 2017-06-17 NOTE — Patient Instructions (Signed)
[facility] Discharge Instructions for Patients Receiving Chemotherapy  Today you received the following chemotherapy agents: Paclitaxel (Taxol) and Carboplatin (Paraplatin)  To help prevent nausea and vomiting after your treatment, we encourage you to take your nausea medication as prescribed. Received Aloxi during treatment today-->Take Compazine (not Zofran) for the next 3 days as needed.   If you develop nausea and vomiting that is not controlled by your nausea medication, call the clinic.   BELOW ARE SYMPTOMS THAT SHOULD BE REPORTED IMMEDIATELY:  *FEVER GREATER THAN 100.5 F  *CHILLS WITH OR WITHOUT FEVER  NAUSEA AND VOMITING THAT IS NOT CONTROLLED WITH YOUR NAUSEA MEDICATION  *UNUSUAL SHORTNESS OF BREATH  *UNUSUAL BRUISING OR BLEEDING  TENDERNESS IN MOUTH AND THROAT WITH OR WITHOUT PRESENCE OF ULCERS  *URINARY PROBLEMS  *BOWEL PROBLEMS  UNUSUAL RASH Items with * indicate a potential emergency and should be followed up as soon as possible.  Feel free to call the clinic should you have any questions or concerns. The clinic phone number is [phone].  Please show the CHEMO ALERT CARD at check-in to the Emergency Department and triage nurse.   

## 2017-06-17 NOTE — Assessment & Plan Note (Signed)
She has worsening neuropathy but is not able to tolerate gabapentin at higher dose Recent reduced dose chemotherapy has helped Her neuropathy is stable and not progressive She will continue on gabapentin for now.

## 2017-06-17 NOTE — Assessment & Plan Note (Signed)
We will proceed with treatment with dose adjustment She is not symptomatic from anemia or thrombocytopenia I educated her to monitor for signs of bleeding

## 2017-06-17 NOTE — Assessment & Plan Note (Signed)
Her blood pressure is significantly elevated but the patient attributed that to anxiety We will monitor her blood pressure carefully

## 2017-06-24 ENCOUNTER — Telehealth: Payer: Self-pay | Admitting: Gynecologic Oncology

## 2017-06-24 DIAGNOSIS — C541 Malignant neoplasm of endometrium: Secondary | ICD-10-CM

## 2017-06-24 MED ORDER — LETROZOLE 2.5 MG PO TABS: 2.5000 mg | ORAL_TABLET | Freq: Every day | ORAL | 6 refills | Status: DC

## 2017-06-24 NOTE — Telephone Encounter (Signed)
Returned call to patient.  Advised that Dr. Skeet Latch recommended her starting Femara 2.5 mg once daily.  Discussed potential side effects.  She is to call for any concerning symptoms, questions, or concerns.  Script sent in to [facility].

## 2017-07-16 ENCOUNTER — Telehealth: Payer: Self-pay | Admitting: *Deleted

## 2017-07-16 NOTE — Telephone Encounter (Signed)
Voicemail received requesting "Return call from Dr. Alvy Bimler nurse.  Message forwarded to collab[employer].  Further patient communication from collaborative nurse.

## 2017-07-23 ENCOUNTER — Inpatient Hospital Stay: Payer: BLUE CROSS/BLUE SHIELD | Attending: Hematology and Oncology

## 2017-07-23 ENCOUNTER — Telehealth: Payer: Self-pay | Admitting: Hematology and Oncology

## 2017-07-23 ENCOUNTER — Inpatient Hospital Stay (HOSPITAL_BASED_OUTPATIENT_CLINIC_OR_DEPARTMENT_OTHER): Payer: BLUE CROSS/BLUE SHIELD | Admitting: Hematology and Oncology

## 2017-07-23 ENCOUNTER — Encounter: Payer: Self-pay | Admitting: Hematology and Oncology

## 2017-07-23 VITALS — BP 140/77 | HR 77 | Temp 98.7°F | Resp 18 | Ht 67.0 in | Wt 275.5 lb

## 2017-07-23 DIAGNOSIS — R232 Flushing: Secondary | ICD-10-CM | POA: Diagnosis not present

## 2017-07-23 DIAGNOSIS — T451X5A Adverse effect of antineoplastic and immunosuppressive drugs, initial encounter: Secondary | ICD-10-CM

## 2017-07-23 DIAGNOSIS — Z90721 Acquired absence of ovaries, unilateral: Secondary | ICD-10-CM | POA: Diagnosis not present

## 2017-07-23 DIAGNOSIS — Z9221 Personal history of antineoplastic chemotherapy: Secondary | ICD-10-CM | POA: Insufficient documentation

## 2017-07-23 DIAGNOSIS — Z79811 Long term (current) use of aromatase inhibitors: Secondary | ICD-10-CM | POA: Insufficient documentation

## 2017-07-23 DIAGNOSIS — D61818 Other pancytopenia: Secondary | ICD-10-CM | POA: Diagnosis not present

## 2017-07-23 DIAGNOSIS — G620 Drug-induced polyneuropathy: Secondary | ICD-10-CM | POA: Diagnosis not present

## 2017-07-23 DIAGNOSIS — T451X5S Adverse effect of antineoplastic and immunosuppressive drugs, sequela: Secondary | ICD-10-CM | POA: Insufficient documentation

## 2017-07-23 DIAGNOSIS — Z923 Personal history of irradiation: Secondary | ICD-10-CM | POA: Insufficient documentation

## 2017-07-23 DIAGNOSIS — C562 Malignant neoplasm of left ovary: Secondary | ICD-10-CM

## 2017-07-23 DIAGNOSIS — C569 Malignant neoplasm of unspecified ovary: Secondary | ICD-10-CM

## 2017-07-23 DIAGNOSIS — D539 Nutritional anemia, unspecified: Secondary | ICD-10-CM | POA: Diagnosis not present

## 2017-07-23 DIAGNOSIS — Z90710 Acquired absence of both cervix and uterus: Secondary | ICD-10-CM | POA: Diagnosis not present

## 2017-07-23 DIAGNOSIS — C541 Malignant neoplasm of endometrium: Secondary | ICD-10-CM | POA: Diagnosis present

## 2017-07-23 LAB — CBC WITH DIFFERENTIAL/PLATELET
BASOS ABS: 0 10*3/uL (ref 0.0–0.1)
BASOS PCT: 0 %
EOS ABS: 0 10*3/uL (ref 0.0–0.5)
EOS PCT: 0 %
HCT: 26.5 % — ABNORMAL LOW (ref 34.8–46.6)
Hemoglobin: 8.5 g/dL — ABNORMAL LOW (ref 11.6–15.9)
Lymphocytes Relative: 30 %
Lymphs Abs: 1.3 10*3/uL (ref 0.9–3.3)
MCH: 32.4 pg (ref 25.1–34.0)
MCHC: 32.1 g/dL (ref 31.5–36.0)
MCV: 101.1 fL — AB (ref 79.5–101.0)
MONO ABS: 0.4 10*3/uL (ref 0.1–0.9)
Monocytes Relative: 9 %
Neutro Abs: 2.5 10*3/uL (ref 1.5–6.5)
Neutrophils Relative %: 61 %
PLATELETS: 104 10*3/uL — AB (ref 145–400)
RBC: 2.62 MIL/uL — ABNORMAL LOW (ref 3.70–5.45)
RDW: 18.1 % — AB (ref 11.2–14.5)
WBC: 4.2 10*3/uL (ref 3.9–10.3)

## 2017-07-23 LAB — COMPREHENSIVE METABOLIC PANEL
ALBUMIN: 3.9 g/dL (ref 3.5–5.0)
ALK PHOS: 99 U/L (ref 40–150)
ALT: 15 U/L (ref 0–55)
AST: 26 U/L (ref 5–34)
Anion gap: 8 (ref 3–11)
BILIRUBIN TOTAL: 0.5 mg/dL (ref 0.2–1.2)
BUN: 15 mg/dL (ref 7–26)
CALCIUM: 9.2 mg/dL (ref 8.4–10.4)
CO2: 25 mmol/L (ref 22–29)
Chloride: 106 mmol/L (ref 98–109)
Creatinine, Ser: 1.2 mg/dL — ABNORMAL HIGH (ref 0.60–1.10)
GFR calc Af Amer: 59 mL/min — ABNORMAL LOW (ref >60)
GFR, EST NON AFRICAN AMERICAN: 51 mL/min — AB (ref >60)
GLUCOSE: 95 mg/dL (ref 70–140)
Potassium: 4 mmol/L (ref 3.5–5.1)
Sodium: 139 mmol/L (ref 136–145)
TOTAL PROTEIN: 8.7 g/dL — AB (ref 6.4–8.3)

## 2017-07-23 MED ORDER — TRAMADOL HCL 50 MG PO TABS: 50.0000 mg | ORAL_TABLET | Freq: Four times a day (QID) | ORAL | 0 refills | Status: DC | PRN

## 2017-07-23 NOTE — Assessment & Plan Note (Signed)
The patient has completed chemotherapy followed by adjuvant treatment with letrozole She tolerated treatment well except for mild hot flashes I recommend repeat blood work and CT imaging next month for objective response to treatment

## 2017-07-23 NOTE — Progress Notes (Signed)
[place] OFFICE PROGRESS NOTE  Patient Care Team: Shirline Frees, MD as PCP - General (Family Medicine)  ASSESSMENT & PLAN:  Endometrial cancer [facility]) The patient has completed chemotherapy followed by adjuvant treatment with letrozole She tolerated treatment well except for mild hot flashes I recommend repeat blood work and CT imaging next month for objective response to treatment  Chemotherapy-induced neuropathy ([place]) She has worsening neuropathy but is not able to tolerate gabapentin at higher dose Recent reduced dose chemotherapy has helped Her neuropathy is stable and not progressive She will continue on gabapentin for now along with occasional tramadol as needed for severe pain  Pancytopenia, acquired ([place]) This is related to recent chemotherapy She is not symptomatic from anemia or thrombocytopenia I educated her to monitor for signs of bleeding I plan to order serum vitamin B12 and iron studies in her next blood draw She is not symptomatic  Hot flashes related to aromatase inhibitor therapy She has intermittent hot flashes secondary to aromatase inhibitor Her hot flashes is not severe We will continue the same for now   Orders Placed This Encounter  Procedures  . CT ABDOMEN PELVIS W CONTRAST    Standing Status:   Future    Standing Expiration Date:   08/27/2018    Order Specific Question:   If indicated for the ordered procedure, I authorize the administration of contrast media per Radiology protocol    Answer:   Yes    Order Specific Question:   Preferred imaging location?    Answer:   [facility]    Order Specific Question:   Radiology Contrast Protocol - do NOT remove file path    Answer:   [email or web address]    Order Specific Question:   Is patient pregnant?    Answer:   No  . Vitamin B12    Standing Status:   Future    Standing Expiration Date:   08/27/2018  . Ferritin    Standing Status:   Future     Standing Expiration Date:   08/27/2018  . Iron and TIBC    Standing Status:   Future    Standing Expiration Date:   08/27/2018  . CBC with Differential/Platelet    Standing Status:   Future    Standing Expiration Date:   08/27/2018  . Comprehensive metabolic panel    Standing Status:   Future    Standing Expiration Date:   08/27/2018  . CA 125    Standing Status:   Future    Standing Expiration Date:   08/27/2018    INTERVAL HISTORY: Please see below for problem oriented charting. She returns for further follow-up She started taking letrozole at the early part of April She has intermittent hot flashes She continues to have intermittent neuropathic pain since recent chemotherapy She denies abdominal bloating, changes in bowel habits or nausea Denies vaginal bleeding She denies recent infection.  SUMMARY OF ONCOLOGIC HISTORY: Oncology History   Recurrent endometrial cancer, strong ER positive 90%, PR 90%, MSI stable Clear cell mixed features of ovaries in 2015 Recurrent disease 2018: low grade endometroid       Endometrial cancer ([place])   02/24/2013 Miscellaneous    Pulmonary embolus  IVC filter was placed and she was started on Lovenox.  She was subsequently transitioned to warfarin.       03/07/2013 Pathology Results    1. Cervix, biopsy - HIGH GRADE ENDOMETRIAL CARCINOMA, SEE COMMENT. 2. Endometrium, biopsy - HIGH GRADE ENDOMETRIAL CARCINOMA, SEE  COMMENT. Microscopic Comment 1. , 2. In both parts 1 and 2, slide sections demonstrate extensive involvement by high grade endometrial carcinoma demonstrating a biphasic epithelial and stromal architecture. The immunophenotype (strong diffuse estrogen receptor; strong diffuse vimentin expression; patchy monoclonal CEA expression, patchy p16 immunostain expression, and diffuse mild to moderate p53 expression) is consistent with primary endometrial origin. 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       03/14/2013 - 01/04/2014 Chemotherapy    taxol carboplatin x 9      08/01/2013 Surgery    Procedure(s) Performed: 1. Exploratory laparotomy with total hysterectomy bilateral salpingo-oophorectomy, omental biopsies.  Surgeon: Francetta Found.  Skeet Latch, MD., PhD  Assistant Surgeon: Lahoma Crocker M.D.   Specimens: Uterus Cervix, Bilateral tubes / ovaries, lower  omentum.   Operative Findings: A 24cm left ovarian mass hemosiderine filled with omental adhesions.  Evidence of chemotherapeutic changes over the bowel.  Uterus 14 cm.  No palpable hepatic mets but the examination was limited by diaphragmatic adhesions.  No gross disease in the omentum. TAH BSO omentectomy Left ovary 20cm clear cell malignancy  Uterus Carcinosarcoma  Multiple microscopic foci, measuring up to 0.5 cm in greatest dimension. Histologic type: Carcinosarcoma (MMMT) with heterologous component (ossification) Grade: High grade Myometrial invasion: 2.1 cm where myometrium is 2.5 cm in thickness Cervical stromal involvement: No.      08/01/2013 Pathology Results    1. Adnexa - ovary +/- tube, neoplastic, left with omentum - HIGH GRADE CARCINOMA WITH BOTH CLEAR CELL CARCINOMA AND ENDOMETRIOID CARCINOMA COMPONENTS, 20 CM. PLEASE SEE ONCOLOGY TEMPLATE FOR DETAIL. 2. Uterus and cervix, with right fallopian tube and right ovary - MICROSCOPIC FOCI OF RESIDUAL CARCINOSARCOMA WITH HETEROLOGOUS ELEMENT, INVOLVING OUTER HALF OF THE MYOMETRIUM. - EXTENSIVE ADENOMYOSIS AND LEIOMYOMA. - CERVIX: BENIGN SQUAMOUS MUCOSA AND ENDOCERVICAL MUCOSA, NO DYSPLASIA OR MALIGNANCY. - RIGHT OVARY AND FALLOPIAN TUBES: NO PATHOLOGIC ABNORMALITIES. Microscopic Comment  1. OVARY Specimen(s): Left ovary and fallopian tube. Procedure: (including lymph node sampling): Left salpingo-oophorectomy Primary tumor site (including laterality): Left  ovary Ovarian surface involvement: Present. Ovarian capsule intact without fragmentation: No. Maximum tumor size (cm): 20 cm, gross measurement. Histologic type: Mixed surface epithelial carcinoma with predominant clear cell carcinoma component (70%) and minor endometrioid component (30%) Please see comment for detail. Grade: High grade Peritoneal implants: (specify invasive or non-invasive): N/A Pelvic extension (list additional structures on separate lines and if involved): No. Lymph nodes: number examined - N/A ; number positive - N/A TNM code: ypT1c2, pNX FIGO Stage (based on pathologic findings, needs clinical correlation): at least IC2 Comments: Received grossly is a 20 cm focally disrupted and partially collapsed multicystic ovary with attached/adherent omentum. Microscopically, sectioned show an invasive high grade surface carcinoma arising in a background of endometriotic cyst. The tumor is predominately composed of clear cell carcinoma component with minor endometrioid carcinoma component. No tumor involvement is identified in the adherent omentum tissue. Immunohistochemical stains were performed and the clear cell carcinoma is patchy positive for p16, ER, p53, negative for WT-1 and p63. Focal angiolymphatic invasion is present  The morphologic features are different from those seen in the uterus. 2. ONCOLOGY TABLE-UTERUS, CARCINOSARCOMA Specimen: Uterus, cervix and right fallopian tubes and ovaries. Procedure: Total hysterectomy and right salpingo-oophorectomy. Lymph node sampling performed: No. Specimen integrity: Intact. Maximum tumor size: Multiple microscopic foci, measuring up to 0.5 cm in greatest dimension. Histologic type: Carcinosarcoma (MMMT) with  heterologous component (ossification) Grade: High grade Myometrial invasion: 2.1 cm where myometrium is 2.5 cm in thickness Cervical stromal involvement: No. Extent of involvement of other organs: No. Lymph - vascular  invasion: Not identified. Peritoneal washings: N/A Lymph nodes: # examined N/A ; # positive N/A Pelvic lymph nodes: N/A involved of N/A lymph nodes. Para-aortic lymph nodes: N/A involved of N/A lymph nodes. Other (specify involvement and site): N/A TNM code: ypT1b, ypNX FIGO Stage (based on pathologic findings, needs clinical correlation): IB Comment: Immunohistochemical stains were performed and the residual carcinoma is positive for p16, negative for WT-1, weakly positive for p53 and ER. The morphologic features are different from those seen in the left ovary. (HCL:gt, 08/04/13)      02/21/2014 - 03/09/2014 Radiation Therapy    Received vaginal brachytherapy TREATMENT DATES: 12/2, 12/7, 12/11, 12/14 and 03/09/14  SITE/DOSE: Vaginal Cuff 4 cm treatment length / 30 Gy in 5 fractions  Technique: HDR with Ir-192       12/17/2016 Relapse/Recurrence    2cm rubbery like mass in the RV septum on physical examination      12/23/2016 Imaging    Reproductive: Previous hysterectomy. New soft tissue mass seen between the vaginal cuff and anterior rectal wall which measures 2.7 x 2.6 cm on image 112/ 2. This is consistent with locally recurrent endometrial carcinoma.      01/05/2017 Imaging    1. Previously noted pelvic mass is similar in size and appearance to the prior examination from 12/23/2016 demonstrating some low-level hypermetabolism. This is nonspecific, and could represent a lesion of rectal, cervical or uterine origin. 2. No definite findings to suggest metastatic disease in the chest, abdomen or pelvis. 3. Small focus of hypermetabolism in the distal third of the esophagus without a perceivable abnormality on the CT portion of the examination. This could represent physiologic activity, a focal area of esophagitis, or an underlying lesion. Clinical correlation is recommended, with consideration for followup  upper endoscopy if clinically appropriate. 4. Nonspecific low-level hypermetabolism in a left axillary lymph node which is stable in size and appearance compared to prior examinations. This is favored to be benign, but correlation with routine annual mammography is recommended. 5. Dilatation of the pulmonic trunk (4 cm in diameter), concerning for pulmonary arterial hypertension. 6. Severe hepatic steatosis.      01/05/2017 PET scan    1. Previously noted pelvic mass is similar in size and appearance to the prior examination from 12/23/2016 demonstrating some low-level hypermetabolism. This is nonspecific, and could represent a lesion of rectal, cervical or uterine origin. 2. No definite findings to suggest metastatic disease in the chest, abdomen or pelvis. 3. Small focus of hypermetabolism in the distal third of the esophagus without a perceivable abnormality on the CT portion of the examination. This could represent physiologic activity, a focal area of esophagitis, or an underlying lesion. Clinical correlation is recommended, with consideration for followup upper endoscopy if clinically appropriate. 4. Nonspecific low-level hypermetabolism in a left axillary lymph node which is stable in size and appearance compared to prior examinations. This is favored to be benign, but correlation with routine annual mammography is recommended. 5. Dilatation of the pulmonic trunk (4 cm in diameter), concerning for pulmonary arterial hypertension. 6. Severe hepatic steatosis. 7. Aortic atherosclerosis, in addition to left anterior descending coronary artery disease. Please note that although the presence of coronary artery calcium documents the presence of coronary artery disease, the severity of this disease and any potential stenosis cannot be assessed on  this non-gated CT examination. Assessment for potential risk factor modification, dietary therapy or pharmacologic therapy may be warranted, if  clinically indicated. 8. Additional incidental findings, as above. Aortic Atherosclerosis (ICD10-I70.0).      01/27/2017 Surgery    She underwent diagnostic laparoscopy, robotic assisted resection of intraperitoneal mass, infracolic omentectomy, peritoneal biopsies and rigid proctoscopy. R1 resection with miliary disease remaining in tumor bed      02/04/2017 Pathology Results    A: Cul-de-sac mass, biopsy  - Endometrioid adenocarcinoma, FIGO grade 1 (architecture grade 1, nuclear grade 2) (see comment)  B: Peritoneum, pelvic, biopsy  - Fragments of fibrovascular tissue with hemorrhage - No malignancy identified  C: Cul-de-sac, biopsy  - Endometrioid adenocarcinoma, FIGO grade 1 (architecture grade 1, nuclear grade 2), with squamous differentiation (see comment)  D: Omentum, omentectomy  - Benign omentum  - No malignancy identified  The purpose of this addendum is to report the results of immunohistochemical stains for mismatch repair proteins and hormone receptors performed on a representative block of tumor.  Immunostains for mismatch repair proteins were performed on block C2 and show intact expression of MLH1, PMS2, MSH2, and MSH6 within the tumor. This is the normal phenotype and does NOT support a diagnosis of microsatellite instability/hereditary non-polyposis colorectal cancer (HNPCC) associated carcinoma. Molecular testing for additional microsatellite instability markers will be performed by the [employer] [phone]) and reported separately.  Estrogen receptor (Ventana, clone SP1):  Positive (90%, weak to moderate)  Progesterone receptor (Ventana, clone [id number]):  Positive (90%, strong)      02/16/2017 Procedure    Successful placement of a right internal jugular approach power injectable Port-A-Cath. The catheter is ready for immediate use.      03/04/2017 -  Chemotherapy    The patient had Carboplatin and Taxol for chemotherapy treatment.          Ovarian cancer ([place])   08/01/2013 Initial Diagnosis    Ovarian cancer ([place]) Unstaged.  Incidental pathology finding      2016 Genetic Testing     Results revealed patient has the following mutation(s): NEGATIVE        REVIEW OF SYSTEMS:   Constitutional: Denies fevers, chills or abnormal weight loss Eyes: Denies blurriness of vision Ears, nose, mouth, throat, and face: Denies mucositis or sore throat Respiratory: Denies cough, dyspnea or wheezes Cardiovascular: Denies palpitation, chest discomfort or lower extremity swelling Gastrointestinal:  Denies nausea, heartburn or change in bowel habits Skin: Denies abnormal skin rashes Lymphatics: Denies new lymphadenopathy or easy bruising Neurological:Denies numbness, tingling or new weaknesses Behavioral/Psych: Mood is stable, no new changes  All other systems were reviewed with the patient and are negative.  I have reviewed the past medical history, past surgical history, social history and family history with the patient and they are unchanged from previous note.  ALLERGIES:  has No Known Allergies.  MEDICATIONS:  Current Outpatient Medications  Medication Sig Dispense Refill  . aspirin EC 81 MG tablet Take 81 mg by mouth daily.    . cetirizine (ZYRTEC) 10 MG tablet Take 10 mg by mouth daily.    Marland Kitchen gabapentin (NEURONTIN) 300 MG capsule Take 1 capsule (300 mg total) by mouth 2 (two) times daily. 90 capsule 11  . letrozole (FEMARA) 2.5 MG tablet Take 1 tablet (2.5 mg total) by mouth daily. 30 tablet 6  . lidocaine-prilocaine (EMLA) cream Apply to affected area once 30 g 3  . ondansetron (ZOFRAN) 8 MG tablet Take 1 tablet (8 mg total) by  mouth 2 (two) times daily as needed for refractory nausea / vomiting. Start on day 3 after chemo. 30 tablet 1  . prochlorperazine (COMPAZINE) 10 MG tablet Take 1 tablet (10 mg total) by mouth every 6 (six) hours as needed (Nausea or vomiting). 30 tablet 1  . traMADol (ULTRAM) 50 MG tablet Take  1 tablet (50 mg total) by mouth every 6 (six) hours as needed. 90 tablet 0   No current facility-administered medications for this visit.     PHYSICAL EXAMINATION: ECOG PERFORMANCE STATUS: 1 - Symptomatic but completely ambulatory  Vitals:   07/23/17 1244  BP: 140/77  Pulse: 77  Resp: 18  Temp: 98.7 F (37.1 C)   Filed Weights   07/23/17 1244  Weight: 275 lb 8 oz (125 kg)    GENERAL:alert, no distress and comfortable SKIN: skin color, texture, turgor are normal, no rashes or significant lesions EYES: normal, Conjunctiva are pink and non-injected, sclera clear OROPHARYNX:no exudate, no erythema and lips, buccal mucosa, and tongue normal  NECK: supple, thyroid normal size, non-tender, without nodularity LYMPH:  no palpable lymphadenopathy in the cervical, axillary or inguinal LUNGS: clear to auscultation and percussion with normal breathing effort HEART: regular rate & rhythm and no murmurs and no lower extremity edema ABDOMEN:abdomen soft, non-tender and normal bowel sounds Musculoskeletal:no cyanosis of digits and no clubbing  NEURO: alert & oriented x 3 with fluent speech, no focal motor/sensory deficits  LABORATORY DATA:  I have reviewed the data as listed    Component Value Date/Time   NA 139 07/23/2017 1212   NA 137 03/25/2017 0956   K 4.0 07/23/2017 1212   K 4.1 03/25/2017 0956   CL 106 07/23/2017 1212   CO2 25 07/23/2017 1212   CO2 25 03/25/2017 0956   GLUCOSE 95 07/23/2017 1212   GLUCOSE 180 (H) 03/25/2017 0956   BUN 15 07/23/2017 1212   BUN 17.3 03/25/2017 0956   CREATININE 1.20 (H) 07/23/2017 1212   CREATININE 1.1 03/25/2017 0956   CALCIUM 9.2 07/23/2017 1212   CALCIUM 9.4 03/25/2017 0956   PROT 8.7 (H) 07/23/2017 1212   PROT 9.1 (H) 03/25/2017 0956   ALBUMIN 3.9 07/23/2017 1212   ALBUMIN 3.6 03/25/2017 0956   AST 26 07/23/2017 1212   AST 22 03/25/2017 0956   ALT 15 07/23/2017 1212   ALT 24 03/25/2017 0956   ALKPHOS 99 07/23/2017 1212   ALKPHOS  100 03/25/2017 0956   BILITOT 0.5 07/23/2017 1212   BILITOT 0.37 03/25/2017 0956   GFRNONAA 51 (L) 07/23/2017 1212   GFRAA 59 (L) 07/23/2017 1212    No results found for: SPEP, UPEP  Lab Results  Component Value Date   WBC 4.2 07/23/2017   NEUTROABS 2.5 07/23/2017   HGB 8.5 (L) 07/23/2017   HCT 26.5 (L) 07/23/2017   MCV 101.1 (H) 07/23/2017   PLT 104 (L) 07/23/2017      Chemistry      Component Value Date/Time   NA 139 07/23/2017 1212   NA 137 03/25/2017 0956   K 4.0 07/23/2017 1212   K 4.1 03/25/2017 0956   CL 106 07/23/2017 1212   CO2 25 07/23/2017 1212   CO2 25 03/25/2017 0956   BUN 15 07/23/2017 1212   BUN 17.3 03/25/2017 0956   CREATININE 1.20 (H) 07/23/2017 1212   CREATININE 1.1 03/25/2017 0956      Component Value Date/Time   CALCIUM 9.2 07/23/2017 1212   CALCIUM 9.4 03/25/2017 0956   ALKPHOS 99 07/23/2017 1212  ALKPHOS 100 03/25/2017 0956   AST 26 07/23/2017 1212   AST 22 03/25/2017 0956   ALT 15 07/23/2017 1212   ALT 24 03/25/2017 0956   BILITOT 0.5 07/23/2017 1212   BILITOT 0.37 03/25/2017 0956     All questions were answered. The patient knows to call the clinic with any problems, questions or concerns. No barriers to learning was detected.  I spent 20 minutes counseling the patient face to face. The total time spent in the appointment was 25 minutes and more than 50% was on counseling and review of test results  Heath Lark, MD 07/23/2017 1:26 PM

## 2017-07-23 NOTE — Telephone Encounter (Signed)
Gave patient AVs and calendar of upcoming June appointments. °

## 2017-07-23 NOTE — Assessment & Plan Note (Signed)
She has intermittent hot flashes secondary to aromatase inhibitor Her hot flashes is not severe We will continue the same for now

## 2017-07-23 NOTE — Assessment & Plan Note (Signed)
She has worsening neuropathy but is not able to tolerate gabapentin at higher dose Recent reduced dose chemotherapy has helped Her neuropathy is stable and not progressive She will continue on gabapentin for now along with occasional tramadol as needed for severe pain

## 2017-07-23 NOTE — Assessment & Plan Note (Signed)
This is related to recent chemotherapy She is not symptomatic from anemia or thrombocytopenia I educated her to monitor for signs of bleeding I plan to order serum vitamin B12 and iron studies in her next blood draw She is not symptomatic

## 2017-08-17 ENCOUNTER — Other Ambulatory Visit: Payer: Self-pay | Admitting: *Deleted

## 2017-08-18 ENCOUNTER — Telehealth: Payer: Self-pay | Admitting: Hematology and Oncology

## 2017-08-18 NOTE — Telephone Encounter (Signed)
Patient scheduled per 5/28 sch message. Patient aware of date and time of updates.

## 2017-08-23 ENCOUNTER — Inpatient Hospital Stay: Payer: BLUE CROSS/BLUE SHIELD | Attending: Hematology and Oncology

## 2017-08-23 ENCOUNTER — Encounter: Payer: Self-pay | Admitting: Hematology and Oncology

## 2017-08-23 ENCOUNTER — Other Ambulatory Visit: Payer: BLUE CROSS/BLUE SHIELD

## 2017-08-23 ENCOUNTER — Ambulatory Visit (HOSPITAL_COMMUNITY)
Admission: RE | Admit: 2017-08-23 | Discharge: 2017-08-23 | Disposition: A | Discharge status: Home / Self Care | Payer: BLUE CROSS/BLUE SHIELD | Source: Ambulatory Visit | Attending: Hematology and Oncology | Admitting: Hematology and Oncology

## 2017-08-23 ENCOUNTER — Inpatient Hospital Stay (HOSPITAL_BASED_OUTPATIENT_CLINIC_OR_DEPARTMENT_OTHER): Payer: BLUE CROSS/BLUE SHIELD | Admitting: Hematology and Oncology

## 2017-08-23 ENCOUNTER — Inpatient Hospital Stay: Payer: BLUE CROSS/BLUE SHIELD

## 2017-08-23 DIAGNOSIS — Z923 Personal history of irradiation: Secondary | ICD-10-CM | POA: Diagnosis not present

## 2017-08-23 DIAGNOSIS — C541 Malignant neoplasm of endometrium: Secondary | ICD-10-CM

## 2017-08-23 DIAGNOSIS — C562 Malignant neoplasm of left ovary: Secondary | ICD-10-CM

## 2017-08-23 DIAGNOSIS — Z90710 Acquired absence of both cervix and uterus: Secondary | ICD-10-CM | POA: Insufficient documentation

## 2017-08-23 DIAGNOSIS — D61818 Other pancytopenia: Secondary | ICD-10-CM | POA: Diagnosis not present

## 2017-08-23 DIAGNOSIS — G620 Drug-induced polyneuropathy: Secondary | ICD-10-CM

## 2017-08-23 DIAGNOSIS — R232 Flushing: Secondary | ICD-10-CM | POA: Diagnosis not present

## 2017-08-23 DIAGNOSIS — M799 Soft tissue disorder, unspecified: Secondary | ICD-10-CM | POA: Insufficient documentation

## 2017-08-23 DIAGNOSIS — D539 Nutritional anemia, unspecified: Secondary | ICD-10-CM

## 2017-08-23 DIAGNOSIS — T451X5A Adverse effect of antineoplastic and immunosuppressive drugs, initial encounter: Secondary | ICD-10-CM

## 2017-08-23 DIAGNOSIS — Z9221 Personal history of antineoplastic chemotherapy: Secondary | ICD-10-CM | POA: Diagnosis not present

## 2017-08-23 DIAGNOSIS — D739 Disease of spleen, unspecified: Secondary | ICD-10-CM | POA: Diagnosis not present

## 2017-08-23 DIAGNOSIS — Z90722 Acquired absence of ovaries, bilateral: Secondary | ICD-10-CM | POA: Insufficient documentation

## 2017-08-23 LAB — CBC WITH DIFFERENTIAL/PLATELET
BASOS PCT: 0 %
Basophils Absolute: 0 10*3/uL (ref 0.0–0.1)
EOS ABS: 0 10*3/uL (ref 0.0–0.5)
Eosinophils Relative: 0 %
HCT: 27.3 % — ABNORMAL LOW (ref 34.8–46.6)
HEMOGLOBIN: 9.3 g/dL — AB (ref 11.6–15.9)
LYMPHS ABS: 0.7 10*3/uL — AB (ref 0.9–3.3)
Lymphocytes Relative: 17 %
MCH: 33.5 pg (ref 25.1–34.0)
MCHC: 34 g/dL (ref 31.5–36.0)
MCV: 98.5 fL (ref 79.5–101.0)
Monocytes Absolute: 0.3 10*3/uL (ref 0.1–0.9)
Monocytes Relative: 8 %
NEUTROS PCT: 75 %
Neutro Abs: 3.1 10*3/uL (ref 1.5–6.5)
Platelets: 157 10*3/uL (ref 145–400)
RBC: 2.77 MIL/uL — AB (ref 3.70–5.45)
RDW: 18.1 % — ABNORMAL HIGH (ref 11.2–14.5)
WBC: 4.1 10*3/uL (ref 3.9–10.3)

## 2017-08-23 LAB — COMPREHENSIVE METABOLIC PANEL
ALK PHOS: 92 U/L (ref 40–150)
ALT: 19 U/L (ref 0–55)
AST: 28 U/L (ref 5–34)
Albumin: 3.8 g/dL (ref 3.5–5.0)
Anion gap: 9 (ref 3–11)
BUN: 13 mg/dL (ref 7–26)
CALCIUM: 9 mg/dL (ref 8.4–10.4)
CO2: 25 mmol/L (ref 22–29)
CREATININE: 1.08 mg/dL (ref 0.60–1.10)
Chloride: 107 mmol/L (ref 98–109)
GFR calc Af Amer: >60 mL/min (ref >60)
GFR calc non Af Amer: 58 mL/min — ABNORMAL LOW (ref >60)
Glucose, Bld: 108 mg/dL (ref 70–140)
Potassium: 3.4 mmol/L — ABNORMAL LOW (ref 3.5–5.1)
SODIUM: 141 mmol/L (ref 136–145)
Total Bilirubin: 0.6 mg/dL (ref 0.2–1.2)
Total Protein: 8.6 g/dL — ABNORMAL HIGH (ref 6.4–8.3)

## 2017-08-23 LAB — FERRITIN: Ferritin: 241 ng/mL (ref 9–269)

## 2017-08-23 LAB — VITAMIN B12: Vitamin B-12: 236 pg/mL (ref 180–914)

## 2017-08-23 LAB — IRON AND TIBC
Iron: 47 ug/dL (ref 41–142)
SATURATION RATIOS: 18 % — AB (ref 21–57)
TIBC: 260 ug/dL (ref 236–444)
UIBC: 214 ug/dL

## 2017-08-23 MED ORDER — IOPAMIDOL (ISOVUE-300) INJECTION 61%
100.0000 mL | Freq: Once | INTRAVENOUS | Status: AC | PRN
  Administered 2017-08-23: 100 mL via INTRAVENOUS

## 2017-08-23 MED ORDER — HEPARIN SOD (PORK) LOCK FLUSH 100 UNIT/ML IV SOLN: 500.0000 [IU] | Freq: Once | INTRAVENOUS | Status: DC

## 2017-08-23 MED ORDER — SODIUM CHLORIDE 0.9% FLUSH
10.0000 mL | Freq: Once | INTRAVENOUS | Status: AC
  Administered 2017-08-23: 10 mL
  Filled 2017-08-23: qty 10

## 2017-08-23 MED ORDER — HEPARIN SOD (PORK) LOCK FLUSH 100 UNIT/ML IV SOLN
INTRAVENOUS | Status: AC
  Administered 2017-08-23: 500 [IU] via INTRAVENOUS
  Filled 2017-08-23: qty 5

## 2017-08-23 MED ORDER — IOPAMIDOL (ISOVUE-300) INJECTION 61%
INTRAVENOUS | Status: DC
Start: 2017-08-23 — End: 2017-08-24
  Filled 2017-08-23: qty 100

## 2017-08-23 NOTE — Assessment & Plan Note (Signed)
Neuropathy is stable/improving She will continue gabapentin indefinitely

## 2017-08-23 NOTE — Assessment & Plan Note (Signed)
CT imaging showed no evidence of recurrence She will continue letrozole We discussed port removal and she agreed to proceed I will get her scheduled to see GYN oncologist in 3 months

## 2017-08-23 NOTE — Progress Notes (Signed)
[place] OFFICE PROGRESS NOTE  Patient Care Team: Shirline Frees, MD as PCP - General (Family Medicine)  ASSESSMENT & PLAN:  Endometrial cancer [facility]) CT imaging showed no evidence of recurrence She will continue letrozole We discussed port removal and she agreed to proceed I will get her scheduled to see GYN oncologist in 3 months  Pancytopenia, acquired ([place]) Anemia is improving Nutritional studies are adequate I reassured the patient that her blood count should improve slowly in the future  Chemotherapy-induced neuropathy (HCC) Neuropathy is stable/improving She will continue gabapentin indefinitely  Hot flashes related to aromatase inhibitor therapy She is getting used to hot flashes induced by letrozole Gabapentin would also help with her hot flashes   No orders of the defined types were placed in this encounter.   INTERVAL HISTORY: Please see below for problem oriented charting. She returns for further follow-up She is doing well She is getting used to side effects of hot flashes Denies abdominal abdominal pain, nausea, changes in bowel habits or vaginal bleeding  SUMMARY OF ONCOLOGIC HISTORY: Oncology History   Recurrent endometrial cancer, strong ER positive 90%, PR 90%, MSI stable Clear cell mixed features of ovaries in 2015 Recurrent disease 2018: low grade endometroid       Endometrial cancer ([place])   02/24/2013 Miscellaneous    Pulmonary embolus  IVC filter was placed and she was started on Lovenox.  She was subsequently transitioned to warfarin.       03/07/2013 Pathology Results    1. Cervix, biopsy - HIGH GRADE ENDOMETRIAL CARCINOMA, SEE COMMENT. 2. Endometrium, biopsy - HIGH GRADE ENDOMETRIAL CARCINOMA, SEE COMMENT. Microscopic Comment 1. , 2. In both parts 1 and 2, slide sections demonstrate extensive involvement by high grade endometrial carcinoma demonstrating a biphasic epithelial and stromal architecture. The  immunophenotype (strong diffuse estrogen receptor; strong diffuse vimentin expression; patchy monoclonal CEA expression, patchy p16 immunostain expression, and diffuse mild to moderate p53 expression) is consistent with primary endometrial origin. 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       03/14/2013 - 01/04/2014 Chemotherapy    taxol carboplatin x 9      08/01/2013 Surgery    Procedure(s) Performed: 1. Exploratory laparotomy with total hysterectomy bilateral salpingo-oophorectomy, omental biopsies.  Surgeon: Francetta Found.  Skeet Latch, MD., PhD  Assistant Surgeon: Lahoma Crocker M.D.   Specimens: Uterus Cervix, Bilateral tubes / ovaries, lower  omentum.   Operative Findings: A 24cm left ovarian mass hemosiderine filled with omental adhesions.  Evidence of chemotherapeutic changes over the bowel.  Uterus 14 cm.  No palpable hepatic mets but the examination was limited by diaphragmatic adhesions.  No gross disease in the omentum. TAH BSO omentectomy Left ovary 20cm clear cell malignancy  Uterus Carcinosarcoma  Multiple microscopic foci, measuring up to 0.5 cm in greatest dimension. Histologic type: Carcinosarcoma (MMMT) with heterologous component (ossification) Grade: High grade Myometrial invasion: 2.1 cm where myometrium is 2.5 cm in thickness Cervical stromal involvement: No.      08/01/2013 Pathology Results    1. Adnexa - ovary +/- tube, neoplastic, left with omentum - HIGH GRADE CARCINOMA WITH BOTH CLEAR CELL CARCINOMA AND ENDOMETRIOID CARCINOMA COMPONENTS, 20 CM. PLEASE SEE ONCOLOGY TEMPLATE FOR DETAIL. 2. Uterus and cervix, with right fallopian tube and right ovary - MICROSCOPIC FOCI OF RESIDUAL CARCINOSARCOMA WITH HETEROLOGOUS ELEMENT, INVOLVING OUTER HALF OF THE MYOMETRIUM. - EXTENSIVE ADENOMYOSIS AND LEIOMYOMA. -  CERVIX: BENIGN  SQUAMOUS MUCOSA AND ENDOCERVICAL MUCOSA, NO DYSPLASIA OR MALIGNANCY. - RIGHT OVARY AND FALLOPIAN TUBES: NO PATHOLOGIC ABNORMALITIES. Microscopic Comment  1. OVARY Specimen(s): Left ovary and fallopian tube. Procedure: (including lymph node sampling): Left salpingo-oophorectomy Primary tumor site (including laterality): Left ovary Ovarian surface involvement: Present. Ovarian capsule intact without fragmentation: No. Maximum tumor size (cm): 20 cm, gross measurement. Histologic type: Mixed surface epithelial carcinoma with predominant clear cell carcinoma component (70%) and minor endometrioid component (30%) Please see comment for detail. Grade: High grade Peritoneal implants: (specify invasive or non-invasive): N/A Pelvic extension (list additional structures on separate lines and if involved): No. Lymph nodes: number examined - N/A ; number positive - N/A TNM code: ypT1c2, pNX FIGO Stage (based on pathologic findings, needs clinical correlation): at least IC2 Comments: Received grossly is a 20 cm focally disrupted and partially collapsed multicystic ovary with attached/adherent omentum. Microscopically, sectioned show an invasive high grade surface carcinoma arising in a background of endometriotic cyst. The tumor is predominately composed of clear cell carcinoma component with minor endometrioid carcinoma component. No tumor involvement is identified in the adherent omentum tissue. Immunohistochemical stains were performed and the clear cell carcinoma is patchy positive for p16, ER, p53, negative for WT-1 and p63. Focal angiolymphatic invasion is present  The morphologic features are different from those seen in the uterus. 2. ONCOLOGY TABLE-UTERUS, CARCINOSARCOMA Specimen: Uterus, cervix and right fallopian tubes and ovaries. Procedure: Total hysterectomy and right salpingo-oophorectomy. Lymph node sampling performed: No. Specimen integrity: Intact. Maximum tumor size: Multiple  microscopic foci, measuring up to 0.5 cm in greatest dimension. Histologic type: Carcinosarcoma (MMMT) with heterologous component (ossification) Grade: High grade Myometrial invasion: 2.1 cm where myometrium is 2.5 cm in thickness Cervical stromal involvement: No. Extent of involvement of other organs: No. Lymph - vascular invasion: Not identified. Peritoneal washings: N/A Lymph nodes: # examined N/A ; # positive N/A Pelvic lymph nodes: N/A involved of N/A lymph nodes. Para-aortic lymph nodes: N/A involved of N/A lymph nodes. Other (specify involvement and site): N/A TNM code: ypT1b, ypNX FIGO Stage (based on pathologic findings, needs clinical correlation): IB Comment: Immunohistochemical stains were performed and the residual carcinoma is positive for p16, negative for WT-1, weakly positive for p53 and ER. The morphologic features are different from those seen in the left ovary. (HCL:gt, 08/04/13)      02/21/2014 - 03/09/2014 Radiation Therapy    Received vaginal brachytherapy TREATMENT DATES: 12/2, 12/7, 12/11, 12/14 and 03/09/14  SITE/DOSE: Vaginal Cuff 4 cm treatment length / 30 Gy in 5 fractions  Technique: HDR with Ir-192       12/17/2016 Relapse/Recurrence    2cm rubbery like mass in the RV septum on physical examination      12/23/2016 Imaging    Reproductive: Previous hysterectomy. New soft tissue mass seen between the vaginal cuff and anterior rectal wall which measures 2.7 x 2.6 cm on image 112/ 2. This is consistent with locally recurrent endometrial carcinoma.      01/05/2017 Imaging    1. Previously noted pelvic mass is similar in size and appearance to the prior examination from 12/23/2016 demonstrating some low-level hypermetabolism. This is nonspecific, and could represent a lesion of rectal, cervical or uterine origin. 2. No definite findings to suggest metastatic disease in the  chest, abdomen or pelvis. 3. Small focus of hypermetabolism in the distal third of the esophagus without a perceivable abnormality on the CT portion of the examination. This could represent physiologic activity, a focal area of  esophagitis, or an underlying lesion. Clinical correlation is recommended, with consideration for followup upper endoscopy if clinically appropriate. 4. Nonspecific low-level hypermetabolism in a left axillary lymph node which is stable in size and appearance compared to prior examinations. This is favored to be benign, but correlation with routine annual mammography is recommended. 5. Dilatation of the pulmonic trunk (4 cm in diameter), concerning for pulmonary arterial hypertension. 6. Severe hepatic steatosis.      01/05/2017 PET scan    1. Previously noted pelvic mass is similar in size and appearance to the prior examination from 12/23/2016 demonstrating some low-level hypermetabolism. This is nonspecific, and could represent a lesion of rectal, cervical or uterine origin. 2. No definite findings to suggest metastatic disease in the chest, abdomen or pelvis. 3. Small focus of hypermetabolism in the distal third of the esophagus without a perceivable abnormality on the CT portion of the examination. This could represent physiologic activity, a focal area of esophagitis, or an underlying lesion. Clinical correlation is recommended, with consideration for followup upper endoscopy if clinically appropriate. 4. Nonspecific low-level hypermetabolism in a left axillary lymph node which is stable in size and appearance compared to prior examinations. This is favored to be benign, but correlation with routine annual mammography is recommended. 5. Dilatation of the pulmonic trunk (4 cm in diameter), concerning for pulmonary arterial hypertension. 6. Severe hepatic steatosis. 7. Aortic atherosclerosis, in addition to left anterior descending coronary artery disease.  Please note that although the presence of coronary artery calcium documents the presence of coronary artery disease, the severity of this disease and any potential stenosis cannot be assessed on this non-gated CT examination. Assessment for potential risk factor modification, dietary therapy or pharmacologic therapy may be warranted, if clinically indicated. 8. Additional incidental findings, as above. Aortic Atherosclerosis (ICD10-I70.0).      01/27/2017 Surgery    She underwent diagnostic laparoscopy, robotic assisted resection of intraperitoneal mass, infracolic omentectomy, peritoneal biopsies and rigid proctoscopy. R1 resection with miliary disease remaining in tumor bed      02/04/2017 Pathology Results    A: Cul-de-sac mass, biopsy  - Endometrioid adenocarcinoma, FIGO grade 1 (architecture grade 1, nuclear grade 2) (see comment)  B: Peritoneum, pelvic, biopsy  - Fragments of fibrovascular tissue with hemorrhage - No malignancy identified  C: Cul-de-sac, biopsy  - Endometrioid adenocarcinoma, FIGO grade 1 (architecture grade 1, nuclear grade 2), with squamous differentiation (see comment)  D: Omentum, omentectomy  - Benign omentum  - No malignancy identified  The purpose of this addendum is to report the results of immunohistochemical stains for mismatch repair proteins and hormone receptors performed on a representative block of tumor.  Immunostains for mismatch repair proteins were performed on block C2 and show intact expression of MLH1, PMS2, MSH2, and MSH6 within the tumor. This is the normal phenotype and does NOT support a diagnosis of microsatellite instability/hereditary non-polyposis colorectal cancer (HNPCC) associated carcinoma. Molecular testing for additional microsatellite instability markers will be performed by the [employer] [id number]) and reported separately.  Estrogen receptor (Ventana, clone SP1):  Positive (90%, weak to  moderate)  Progesterone receptor (Ventana, clone 1E2):  Positive (90%, strong)      02/16/2017 Procedure    Successful placement of a right internal jugular approach power injectable Port-A-Cath. The catheter is ready for immediate use.      03/04/2017 - 06/17/2017 Chemotherapy    The patient had Carboplatin and Taxol for chemotherapy treatment.        08/23/2017 Imaging  1. Soft tissue lesion seen posterior to the vagina on the previous PET-CT is not evident today. 2. Trace free fluid in the pelvis. 3. Similar appearance tiny lymph nodes scattered along both common and external iliac chains. 4. 12 mm low-density lesion in the central spleen appears stable. Attention on follow-up recommended.       Ovarian cancer ([place])   08/01/2013 Initial Diagnosis    Ovarian cancer ([place]) Unstaged.  Incidental pathology finding      2016 Genetic Testing     Results revealed patient has the following mutation(s): NEGATIVE        REVIEW OF SYSTEMS:   Constitutional: Denies fevers, chills or abnormal weight loss Eyes: Denies blurriness of vision Ears, nose, mouth, throat, and face: Denies mucositis or sore throat Respiratory: Denies cough, dyspnea or wheezes Cardiovascular: Denies palpitation, chest discomfort or lower extremity swelling Gastrointestinal:  Denies nausea, heartburn or change in bowel habits Skin: Denies abnormal skin rashes Lymphatics: Denies new lymphadenopathy or easy bruising Neurological:Denies numbness, tingling or new weaknesses Behavioral/Psych: Mood is stable, no new changes  All other systems were reviewed with the patient and are negative.  I have reviewed the past medical history, past surgical history, social history and family history with the patient and they are unchanged from previous note.  ALLERGIES:  has No Known Allergies.  MEDICATIONS:  Current Outpatient Medications  Medication Sig Dispense Refill  . aspirin EC 81 MG tablet Take 81 mg by mouth  daily.    . cetirizine (ZYRTEC) 10 MG tablet Take 10 mg by mouth daily.    [place] Kitchen gabapentin (NEURONTIN) 300 MG capsule Take 1 capsule (300 mg total) by mouth 2 (two) times daily. 90 capsule 11  . letrozole (FEMARA) 2.5 MG tablet Take 1 tablet (2.5 mg total) by mouth daily. 30 tablet 6  . lidocaine-prilocaine (EMLA) cream Apply to affected area once 30 g 3  . ondansetron (ZOFRAN) 8 MG tablet Take 1 tablet (8 mg total) by mouth 2 (two) times daily as needed for refractory nausea / vomiting. Start on day 3 after chemo. 30 tablet 1  . prochlorperazine (COMPAZINE) 10 MG tablet Take 1 tablet (10 mg total) by mouth every 6 (six) hours as needed (Nausea or vomiting). 30 tablet 1  . traMADol (ULTRAM) 50 MG tablet Take 1 tablet (50 mg total) by mouth every 6 (six) hours as needed. 90 tablet 0   No current facility-administered medications for this visit.    Facility-Administered Medications Ordered in Other Visits  Medication Dose Route Frequency Provider Last Rate Last Dose  . heparin lock flush 100 unit/mL  500 Units Intravenous Once Darlen Gledhill, MD      . iopamidol (ISOVUE-300) 61 % injection             PHYSICAL EXAMINATION: ECOG PERFORMANCE STATUS: 1 - Symptomatic but completely ambulatory  Vitals:   08/23/17 1349  BP: (!) 147/98  Pulse: 77  Resp: 18  Temp: 98.7 F (37.1 C)  SpO2: 100%   Filed Weights   08/23/17 1349  Weight: 277 lb 4.8 oz (125.8 kg)    GENERAL:alert, no distress and comfortable SKIN: skin color, texture, turgor are normal, no rashes or significant lesions NEURO: alert & oriented x 3 with fluent speech, no focal motor/sensory deficits  LABORATORY DATA:  I have reviewed the data as listed    Component Value Date/Time   NA 141 08/23/2017 1019   NA 137 03/25/2017 0956   K 3.4 (L) 08/23/2017 1019  K 4.1 03/25/2017 0956   CL 107 08/23/2017 1019   CO2 25 08/23/2017 1019   CO2 25 03/25/2017 0956   GLUCOSE 108 08/23/2017 1019   GLUCOSE 180 (H) 03/25/2017 0956   BUN  13 08/23/2017 1019   BUN 17.3 03/25/2017 0956   CREATININE 1.08 08/23/2017 1019   CREATININE 1.1 03/25/2017 0956   CALCIUM 9.0 08/23/2017 1019   CALCIUM 9.4 03/25/2017 0956   PROT 8.6 (H) 08/23/2017 1019   PROT 9.1 (H) 03/25/2017 0956   ALBUMIN 3.8 08/23/2017 1019   ALBUMIN 3.6 03/25/2017 0956   AST 28 08/23/2017 1019   AST 22 03/25/2017 0956   ALT 19 08/23/2017 1019   ALT 24 03/25/2017 0956   ALKPHOS 92 08/23/2017 1019   ALKPHOS 100 03/25/2017 0956   BILITOT 0.6 08/23/2017 1019   BILITOT 0.37 03/25/2017 0956   GFRNONAA 58 (L) 08/23/2017 1019   GFRAA >60 08/23/2017 1019    No results found for: SPEP, UPEP  Lab Results  Component Value Date   WBC 4.1 08/23/2017   NEUTROABS 3.1 08/23/2017   HGB 9.3 (L) 08/23/2017   HCT 27.3 (L) 08/23/2017   MCV 98.5 08/23/2017   PLT 157 08/23/2017      Chemistry      Component Value Date/Time   NA 141 08/23/2017 1019   NA 137 03/25/2017 0956   K 3.4 (L) 08/23/2017 1019   K 4.1 03/25/2017 0956   CL 107 08/23/2017 1019   CO2 25 08/23/2017 1019   CO2 25 03/25/2017 0956   BUN 13 08/23/2017 1019   BUN 17.3 03/25/2017 0956   CREATININE 1.08 08/23/2017 1019   CREATININE 1.1 03/25/2017 0956      Component Value Date/Time   CALCIUM 9.0 08/23/2017 1019   CALCIUM 9.4 03/25/2017 0956   ALKPHOS 92 08/23/2017 1019   ALKPHOS 100 03/25/2017 0956   AST 28 08/23/2017 1019   AST 22 03/25/2017 0956   ALT 19 08/23/2017 1019   ALT 24 03/25/2017 0956   BILITOT 0.6 08/23/2017 1019   BILITOT 0.37 03/25/2017 0956       RADIOGRAPHIC STUDIES: I have reviewed multiple imaging study with the patient I have personally reviewed the radiological images as listed and agreed with the findings in the report. Ct Abdomen Pelvis W Contrast  Result Date: 08/23/2017 CLINICAL DATA:  Recurrent endometrial cancer. EXAM: CT ABDOMEN AND PELVIS WITH CONTRAST TECHNIQUE: Multidetector CT imaging of the abdomen and pelvis was performed using the standard protocol  following bolus administration of intravenous contrast. CONTRAST:  110m ISOVUE-300 IOPAMIDOL (ISOVUE-300) INJECTION 61% COMPARISON:  PET-CT 01/05/2017.  CT scan 12/23/2016. FINDINGS: Lower chest: Peripheral scarring right lower lobe is similar to prior. Hepatobiliary: Small area of low attenuation in the anterior liver, adjacent to the falciform ligament, is in a characteristic location for focal fatty deposition. Geographic fatty deposition noted within the liver parenchyma. There is no evidence for gallstones, gallbladder wall thickening, or pericholecystic fluid. No intrahepatic or extrahepatic biliary dilation. Pancreas: No focal mass lesion. No dilatation of the main duct. No intraparenchymal cyst. No peripancreatic edema. Spleen: Similar appearance of the 12 mm low-density lesion in the central spleen. Adrenals/Urinary Tract: No adrenal nodule or mass. Kidneys unremarkable No evidence for hydroureter. The urinary bladder appears normal for the degree of distention. Stomach/Bowel: Stomach is nondistended. No gastric wall thickening. No evidence of outlet obstruction. Duodenum is normally positioned as is the ligament of Treitz. No small bowel dilatation. The appendix is normal. No gross colonic mass.  No colonic wall thickening. No substantial diverticular change. Vascular/Lymphatic: There is abdominal aortic atherosclerosis without aneurysm. Infrarenal IVC filter evident. There is no gastrohepatic or hepatoduodenal ligament lymphadenopathy. No intraperitoneal or retroperitoneal lymphadenopathy. Similar appearance of small lymph nodes seen scattered along the common and external iliac chains bilaterally. Reproductive: Uterus surgically absent.  There is no adnexal mass. Other: Trace free fluid evident in the central pelvis. Musculoskeletal: Bone windows reveal no worrisome lytic or sclerotic osseous lesions. IMPRESSION: 1. Soft tissue lesion seen posterior to the vagina on the previous PET-CT is not evident  today. 2. Trace free fluid in the pelvis. 3. Similar appearance tiny lymph nodes scattered along both common and external iliac chains. 4. 12 mm low-density lesion in the central spleen appears stable. Attention on follow-up recommended. Electronically Signed   By: Misty Stanley M.D.   On: 08/23/2017 14:33    All questions were answered. The patient knows to call the clinic with any problems, questions or concerns. No barriers to learning was detected.  I spent 15 minutes counseling the patient face to face. The total time spent in the appointment was 20 minutes and more than 50% was on counseling and review of test results  Heath Lark, MD 08/23/2017 2:48 PM

## 2017-08-23 NOTE — Assessment & Plan Note (Addendum)
She is getting used to hot flashes induced by letrozole Gabapentin would also help with her hot flashes

## 2017-08-23 NOTE — Assessment & Plan Note (Signed)
Anemia is improving Nutritional studies are adequate I reassured the patient that her blood count should improve slowly in the future

## 2017-08-24 ENCOUNTER — Telehealth: Payer: Self-pay | Admitting: *Deleted

## 2017-08-24 ENCOUNTER — Ambulatory Visit: Payer: BLUE CROSS/BLUE SHIELD | Admitting: Hematology and Oncology

## 2017-08-24 ENCOUNTER — Inpatient Hospital Stay: Payer: BLUE CROSS/BLUE SHIELD | Admitting: Hematology and Oncology

## 2017-08-24 ENCOUNTER — Telehealth: Payer: Self-pay | Admitting: Oncology

## 2017-08-24 ENCOUNTER — Other Ambulatory Visit: Payer: Self-pay | Admitting: Hematology and Oncology

## 2017-08-24 DIAGNOSIS — C541 Malignant neoplasm of endometrium: Secondary | ICD-10-CM

## 2017-08-24 LAB — CA 125: CANCER ANTIGEN (CA) 125: 7.7 U/mL (ref 0.0–38.1)

## 2017-08-24 NOTE — Telephone Encounter (Signed)
Notified of message below. Verbalized understanding 

## 2017-08-24 NOTE — Telephone Encounter (Signed)
Left a message advising of appointment with Dr. Skeet Latch on 09/09/17 at 3:15 pm.

## 2017-08-24 NOTE — Telephone Encounter (Signed)
-----   Message from Heath Lark, MD sent at 08/24/2017  8:23 AM EDT ----- Regarding: labs All labs and CT report are good: please let her know I will put order for port removal Please let her know IR will call for port removal

## 2017-09-09 ENCOUNTER — Inpatient Hospital Stay (HOSPITAL_BASED_OUTPATIENT_CLINIC_OR_DEPARTMENT_OTHER): Payer: BLUE CROSS/BLUE SHIELD | Admitting: Gynecologic Oncology

## 2017-09-09 ENCOUNTER — Encounter: Payer: Self-pay | Admitting: Gynecologic Oncology

## 2017-09-09 VITALS — BP 105/71 | HR 83 | Temp 98.1°F | Resp 20 | Ht 67.0 in | Wt 279.2 lb

## 2017-09-09 DIAGNOSIS — Z90710 Acquired absence of both cervix and uterus: Secondary | ICD-10-CM | POA: Diagnosis not present

## 2017-09-09 DIAGNOSIS — Z90722 Acquired absence of ovaries, bilateral: Secondary | ICD-10-CM

## 2017-09-09 DIAGNOSIS — Z9221 Personal history of antineoplastic chemotherapy: Secondary | ICD-10-CM | POA: Diagnosis not present

## 2017-09-09 DIAGNOSIS — C541 Malignant neoplasm of endometrium: Secondary | ICD-10-CM | POA: Diagnosis not present

## 2017-09-09 DIAGNOSIS — Z923 Personal history of irradiation: Secondary | ICD-10-CM | POA: Diagnosis not present

## 2017-09-09 NOTE — Progress Notes (Signed)
GYN ONCOLOGY OFFICE VISIT    Caitlyn Frees, MD [place] [address]   CHIEF COMPLAINT:  uterine carcinosarcoma, ovarian clear cell cancer; surveillance  : Cancer Staging Endometrial cancer ([place]) Staging form: Corpus Uteri - Carcinoma, AJCC 7th Edition - Clinical: Stage IVB (T3b, NX, M1, Free text: high grade carcinosarcoma M1) - Signed by Gordy Levan, MD on 05/03/2013 - Pathologic: Stage IVB (T3b, NX, M1) - Signed by Gordy Levan, MD on 05/03/2013  Left ovarian epithelial cancer ([place]) Staging form: Ovary, AJCC 7th Edition - Clinical: Stage IV (T1c, NX, M1) - Signed by Gordy Levan, MD on 09/09/2013 - Pathologic: No stage assigned - Unsigned   SUMMARY OF ONCOLOGIC HISTORY: Oncology History   Recurrent endometrial cancer, strong ER positive 90%, PR 90%, MSI stable Clear cell mixed features of ovaries in 2015 Recurrent disease 2018: low grade endometroid       Endometrial cancer ([place])   02/24/2013 Miscellaneous    Pulmonary embolus  IVC filter was placed and she was started on Lovenox.  She was subsequently transitioned to warfarin.       03/07/2013 Pathology Results    1. Cervix, biopsy - HIGH GRADE ENDOMETRIAL CARCINOMA, SEE COMMENT. 2. Endometrium, biopsy - HIGH GRADE ENDOMETRIAL CARCINOMA, SEE COMMENT. Microscopic Comment 1. , 2. In both parts 1 and 2, slide sections demonstrate extensive involvement by high grade endometrial carcinoma demonstrating a biphasic epithelial and stromal architecture. The immunophenotype (strong diffuse estrogen receptor; strong diffuse vimentin expression; patchy monoclonal CEA expression, patchy p16 immunostain expression, and diffuse mild to moderate p53 expression) is consistent with primary endometrial origin. 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        03/14/2013 - 01/04/2014 Chemotherapy    taxol carboplatin x 9      08/01/2013 Surgery    Procedure(s) Performed: 1. Exploratory laparotomy with total hysterectomy bilateral salpingo-oophorectomy, omental biopsies.  Surgeon: Francetta Found.  Skeet Latch, MD., PhD  Assistant Surgeon: Lahoma Crocker M.D.   Specimens: Uterus Cervix, Bilateral tubes / ovaries, lower  omentum.   Operative Findings: A 24cm left ovarian mass hemosiderine filled with omental adhesions.  Evidence of chemotherapeutic changes over the bowel.  Uterus 14 cm.  No palpable hepatic mets but the examination was limited by diaphragmatic adhesions.  No gross disease in the omentum. TAH BSO omentectomy Left ovary 20cm clear cell malignancy  Uterus Carcinosarcoma  Multiple microscopic foci, measuring up to 0.5 cm in greatest dimension. Histologic type: Carcinosarcoma (MMMT) with heterologous component (ossification) Grade: High grade Myometrial invasion: 2.1 cm where myometrium is 2.5 cm in thickness Cervical stromal involvement: No.      08/01/2013 Pathology Results    1. Adnexa - ovary +/- tube, neoplastic, left with omentum - HIGH GRADE CARCINOMA WITH BOTH CLEAR CELL CARCINOMA AND ENDOMETRIOID CARCINOMA COMPONENTS, 20 CM. PLEASE SEE ONCOLOGY TEMPLATE FOR DETAIL. 2. Uterus and cervix, with right fallopian tube and right ovary - MICROSCOPIC FOCI OF RESIDUAL CARCINOSARCOMA WITH HETEROLOGOUS ELEMENT, INVOLVING OUTER HALF OF THE MYOMETRIUM. - EXTENSIVE ADENOMYOSIS AND LEIOMYOMA. - CERVIX: BENIGN SQUAMOUS MUCOSA AND ENDOCERVICAL MUCOSA, NO DYSPLASIA OR MALIGNANCY. - RIGHT OVARY AND FALLOPIAN TUBES: NO PATHOLOGIC ABNORMALITIES. Microscopic Comment  1. OVARY Specimen(s): Left ovary and fallopian tube. Procedure: (including lymph node sampling): Left salpingo-oophorectomy Primary tumor site (including laterality): Left ovary Ovarian surface involvement: Present. Ovarian capsule intact without fragmentation:  No.  Maximum tumor size (cm): 20 cm, gross measurement. Histologic type: Mixed surface epithelial carcinoma with predominant clear cell carcinoma component (70%) and minor endometrioid component (30%) Please see comment for detail. Grade: High grade Peritoneal implants: (specify invasive or non-invasive): N/A Pelvic extension (list additional structures on separate lines and if involved): No. Lymph nodes: number examined - N/A ; number positive - N/A TNM code: ypT1c2, pNX FIGO Stage (based on pathologic findings, needs clinical correlation): at least IC2 Comments: Received grossly is a 20 cm focally disrupted and partially collapsed multicystic ovary with attached/adherent omentum. Microscopically, sectioned show an invasive high grade surface carcinoma arising in a background of endometriotic cyst. The tumor is predominately composed of clear cell carcinoma component with minor endometrioid carcinoma component. No tumor involvement is identified in the adherent omentum tissue. Immunohistochemical stains were performed and the clear cell carcinoma is patchy positive for p16, ER, p53, negative for WT-1 and p63. Focal angiolymphatic invasion is present  The morphologic features are different from those seen in the uterus. 2. ONCOLOGY TABLE-UTERUS, CARCINOSARCOMA Specimen: Uterus, cervix and right fallopian tubes and ovaries. Procedure: Total hysterectomy and right salpingo-oophorectomy. Lymph node sampling performed: No. Specimen integrity: Intact. Maximum tumor size: Multiple microscopic foci, measuring up to 0.5 cm in greatest dimension. Histologic type: Carcinosarcoma (MMMT) with heterologous component (ossification) Grade: High grade Myometrial invasion: 2.1 cm where myometrium is 2.5 cm in thickness Cervical stromal involvement: No. Extent of involvement of other organs: No. Lymph - vascular invasion: Not identified. Peritoneal washings: N/A Lymph nodes: # examined N/A ; # positive  N/A Pelvic lymph nodes: N/A involved of N/A lymph nodes. Para-aortic lymph nodes: N/A involved of N/A lymph nodes. Other (specify involvement and site): N/A TNM code: ypT1b, ypNX FIGO Stage (based on pathologic findings, needs clinical correlation): IB Comment: Immunohistochemical stains were performed and the residual carcinoma is positive for p16, negative for WT-1, weakly positive for p53 and ER. The morphologic features are different from those seen in the left ovary. (HCL:gt, 08/04/13)      02/21/2014 - 03/09/2014 Radiation Therapy    Received vaginal brachytherapy TREATMENT DATES: 12/2, 12/7, 12/11, 12/14 and 03/09/14  SITE/DOSE: Vaginal Cuff 4 cm treatment length / 30 Gy in 5 fractions  Technique: HDR with Ir-192       12/17/2016 Relapse/Recurrence    2cm rubbery like mass in the RV septum on physical examination      12/23/2016 Imaging    Reproductive: Previous hysterectomy. New soft tissue mass seen between the vaginal cuff and anterior rectal wall which measures 2.7 x 2.6 cm on image 112/ 2. This is consistent with locally recurrent endometrial carcinoma.      01/05/2017 Imaging    1. Previously noted pelvic mass is similar in size and appearance to the prior examination from 12/23/2016 demonstrating some low-level hypermetabolism. This is nonspecific, and could represent a lesion of rectal, cervical or uterine origin. 2. No definite findings to suggest metastatic disease in the chest, abdomen or pelvis. 3. Small focus of hypermetabolism in the distal third of the esophagus without a perceivable abnormality on the CT portion of the examination. This could represent physiologic activity, a focal area of esophagitis, or an underlying lesion. Clinical correlation is recommended, with consideration for followup upper endoscopy if clinically appropriate. 4. Nonspecific low-level hypermetabolism in a left  axillary lymph node which is stable in size and appearance compared to prior examinations. This is favored to be benign, but correlation with routine annual mammography is recommended. 5. Dilatation of  the pulmonic trunk (4 cm in diameter), concerning for pulmonary arterial hypertension. 6. Severe hepatic steatosis.      01/05/2017 PET scan    1. Previously noted pelvic mass is similar in size and appearance to the prior examination from 12/23/2016 demonstrating some low-level hypermetabolism. This is nonspecific, and could represent a lesion of rectal, cervical or uterine origin. 2. No definite findings to suggest metastatic disease in the chest, abdomen or pelvis. 3. Small focus of hypermetabolism in the distal third of the esophagus without a perceivable abnormality on the CT portion of the examination. This could represent physiologic activity, a focal area of esophagitis, or an underlying lesion. Clinical correlation is recommended, with consideration for followup upper endoscopy if clinically appropriate. 4. Nonspecific low-level hypermetabolism in a left axillary lymph node which is stable in size and appearance compared to prior examinations. This is favored to be benign, but correlation with routine annual mammography is recommended. 5. Dilatation of the pulmonic trunk (4 cm in diameter), concerning for pulmonary arterial hypertension. 6. Severe hepatic steatosis. 7. Aortic atherosclerosis, in addition to left anterior descending coronary artery disease. Please note that although the presence of coronary artery calcium documents the presence of coronary artery disease, the severity of this disease and any potential stenosis cannot be assessed on this non-gated CT examination. Assessment for potential risk factor modification, dietary therapy or pharmacologic therapy may be warranted, if clinically indicated. 8. Additional incidental findings, as above. Aortic Atherosclerosis  (ICD10-I70.0).      01/27/2017 Surgery    She underwent diagnostic laparoscopy, robotic assisted resection of intraperitoneal mass, infracolic omentectomy, peritoneal biopsies and rigid proctoscopy. R1 resection with miliary disease remaining in tumor bed      02/04/2017 Pathology Results    A: Cul-de-sac mass, biopsy  - Endometrioid adenocarcinoma, FIGO grade 1 (architecture grade 1, nuclear grade 2) (see comment)  B: Peritoneum, pelvic, biopsy  - Fragments of fibrovascular tissue with hemorrhage - No malignancy identified  C: Cul-de-sac, biopsy  - Endometrioid adenocarcinoma, FIGO grade 1 (architecture grade 1, nuclear grade 2), with squamous differentiation (see comment)  D: Omentum, omentectomy  - Benign omentum  - No malignancy identified  The purpose of this addendum is to report the results of immunohistochemical stains for mismatch repair proteins and hormone receptors performed on a representative block of tumor.  Immunostains for mismatch repair proteins were performed on block C2 and show intact expression of MLH1, PMS2, MSH2, and MSH6 within the tumor. This is the normal phenotype and does NOT support a diagnosis of microsatellite instability/hereditary non-polyposis colorectal cancer (HNPCC) associated carcinoma. Molecular testing for additional microsatellite instability markers will be performed by the [employer] [phone]) and reported separately.  Estrogen receptor (Ventana, clone SP1):  Positive (90%, weak to moderate)  Progesterone receptor (Ventana, clone 1E2):  Positive (90%, strong)      02/16/2017 Procedure    Successful placement of a right internal jugular approach power injectable Port-A-Cath. The catheter is ready for immediate use.      03/04/2017 - 06/17/2017 Chemotherapy    The patient had Carboplatin and Taxol for chemotherapy treatment.        06/24/2017 -  Anti-estrogen oral therapy    She started letrozole       08/23/2017 Imaging    1. Soft tissue lesion seen posterior to the vagina on the previous PET-CT is not evident today. 2. Trace free fluid in the pelvis. 3. Similar appearance tiny lymph nodes scattered along both common and external  iliac chains. 4. 12 mm low-density lesion in the central spleen appears stable. Attention on follow-up recommended.       Ovarian cancer ([place])   08/01/2013 Initial Diagnosis    Ovarian cancer ([place]) Unstaged.  Incidental pathology finding      2016 Genetic Testing     Results revealed patient has the following mutation(s): NEGATIVE         INTERVAL HISTORY: Caitlyn Higgins 52 y.o. female returns for balance from recurrent endometrioid component of her uterine carcinosarcoma.  She feels well and states that the bony pain associated with the letrozole has decreased and has no complaints   Patient Active Problem List   Diagnosis Date Noted  . Deficiency anemia 07/23/2017  . Hot flashes related to aromatase inhibitor therapy 07/23/2017  . Steroid-induced hyperglycemia 05/27/2017  . Pancytopenia, acquired ([facility]) 05/06/2017  . Mucositis due to antineoplastic therapy 03/25/2017  . Goals of care, counseling/discussion 02/22/2017  . Chemotherapy-induced neuropathy ([facility]) 09/24/2015  . Presence of IVC filter 08/14/2014  . Uterine carcinosarcoma ([facility]) 08/14/2014  . Morbid obesity ([facility]) 02/19/2014  . Left thyroid nodule 11/08/2013  . Family history of malignant neoplasm of breast   . Ovarian cancer ([place])   . Left ovarian epithelial cancer ([facility]) 09/09/2013  . Endometrial cancer ([facility]) 03/10/2013  . HTN (hypertension) 02/22/2013    has No Known Allergies.  MEDICAL HISTORY: Past Medical History:  Diagnosis Date  . Anemia   . Cancer ([facility])    uterine carcinosarcoma  . DVT (deep venous thrombosis) ([facility])    right leg 12'14  . Elevated blood uric acid level    tx. Allopurinol  . Family history of malignant neoplasm of breast   . GERD (gastroesophageal  reflux disease)   . History of chemotherapy   . Hx of radiation therapy HDR   x 5 tx  completed 03/09/14  . Hypertension   . Ovarian cancer ([facility]) 2015  . Pulmonary emboli ([facility])    12'14 denies any sob  . Reflux   . Transfusion history 07-26-13   12'14, 04-14-13(2 units), 06-09-13    SURGICAL HISTORY: Past Surgical History:  Procedure Laterality Date  . ABDOMINAL HYSTERECTOMY N/A 08/01/2013   Procedure: EXPLORATORY LAPAROTOMY/HYSTERECTOMY TOTAL ABDOMINAL;  Surgeon: Janie Morning, MD;  Location: [facility];  Service: Gynecology;  Laterality: N/A;  . IR FLUORO GUIDE PORT INSERTION RIGHT  02/16/2017  . IR US GUIDE VASC ACCESS RIGHT  02/16/2017  . IVC Right    filter placed to prevent Pulmonary emboli  . PORTACATH PLACEMENT Right    rigth chest- remains  . removed tumor from rectum    . SALPINGOOPHORECTOMY Bilateral 08/01/2013   Procedure: BILATERAL SALPINGO OOPHORECTOMY/OMENTECTOMY ;  Surgeon: Janie Morning, MD;  Location: [facility];  Service: Gynecology;  Laterality: Bilateral;    SOCIAL HISTORY: Social History   Socioeconomic History  . Marital status: Married    Spouse name: Not on file  . Number of children: Not on file  . Years of education: Not on file  . Highest education level: Not on file  Occupational History  . Not on file  Social Needs  . Financial resource strain: Not on file  . Food insecurity:    Worry: Not on file    Inability: Not on file  . Transportation needs:    Medical: Not on file    Non-medical: Not on file  Tobacco Use  . Smoking status: Never Smoker  . Smokeless tobacco: Never Used  Substance and Sexual Activity  .  Alcohol use: No    Comment: Quit  2 yrs ago-social in past  . Drug use: No  . Sexual activity: Yes  Lifestyle  . Physical activity:    Days per week: Not on file    Minutes per session: Not on file  . Stress: Not on file  Relationships  . Social connections:    Talks on phone: Not on file    Gets together: Not on file    Attends  religious service: Not on file    Active member of club or organization: Not on file    Attends meetings of clubs or organizations: Not on file    Relationship status: Not on file  . Intimate partner violence:    Fear of current or ex partner: Not on file    Emotionally abused: Not on file    Physically abused: Not on file    Forced sexual activity: Not on file  Other Topics Concern  . Not on file  Social History Narrative  . Not on file  Donates to the care of her mother with dementia with her 2 sisters.  The foster children are growing and she has much joy in her family life.  Her church family provides a great deal of emotional and spiritual support   FAMILY HISTORY: Family History  Problem Relation Age of Onset  . Diabetes Mother   . Hypertension Mother   . Hypertension Father   . Diabetes Father   . Prostate cancer Father 22       deceased 30  . Breast cancer Maternal Aunt 74       deceased 31  . Prostate cancer Maternal Uncle        5 of 6 mat uncles with prostate cancer; deceased  . Breast cancer Paternal Aunt 54       deceased 66  . Stomach cancer Paternal Grandmother        deceased 5  . Breast cancer Maternal Aunt 59       deceased 2  . Breast cancer Cousin 37       negative BRCAplus, PALB2 2015  . Breast cancer Paternal Aunt 65       deceased 47  . Thyroid disease Neg Hx     Review of Systems  Constitutional: Negative.  Negative for appetite change, chills, diaphoresis, fatigue and fever.  HENT:  Negative.   Eyes: Negative.   Respiratory: Negative.   Cardiovascular: Negative.   Gastrointestinal: Negative.  Negative for abdominal distention, abdominal pain, blood in stool and constipation.  Endocrine: Positive for hot flashes.  Genitourinary: Negative.    Musculoskeletal: Negative.   Skin: Negative.   Neurological: Negative.   Hematological: Negative.   Psychiatric/Behavioral: Negative.       PHYSICAL EXAMINATION  ECOG PERFORMANCE STATUS: 0 -  Asymptomatic  Vitals:   09/09/17 1536  BP: 105/71  Pulse: 83  Resp: 20  Temp: 98.1 F (36.7 C)    Physical Exam  Constitutional: She appears well-developed and well-nourished. No distress.  HENT:  Head: Normocephalic and atraumatic.  Eyes: No scleral icterus.  Neck: Normal range of motion. Neck supple. No JVD present. No tracheal deviation present.  Cardiovascular: Normal rate, regular rhythm and normal heart sounds.  Pulmonary/Chest: Effort normal and breath sounds normal.  Abdominal: Soft. Normal appearance and bowel sounds are normal. She exhibits no distension, no fluid wave, no ascites and no mass. There is no hepatosplenomegaly. There is no tenderness. There is no CVA tenderness. No  hernia.  Genitourinary: Rectal exam shows external hemorrhoid. Rectal exam shows no mass, no tenderness and anal tone normal. Pelvic exam was performed with patient supine. Right adnexum displays no mass, no tenderness and no fullness. Left adnexum displays no mass, no tenderness and no fullness. No tenderness or bleeding in the vagina. No vaginal discharge found.  Skin: No burn, no laceration and no lesion noted. No cyanosis. Nails show no clubbing.  Psychiatric: Her speech is normal and behavior is normal. Thought content normal. Her mood appears not anxious. Her affect is not blunt and not labile. Cognition and memory are normal. She does not exhibit a depressed mood.    ASSESSMENT and THERAPY PLAN:  Caitlyn Higgins is a 52 y.o. 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  Endometrioid recurrence was identified in October 2018.  Surgical resection and chemotherapy with Taxol carboplatin was completed March 2019.  Imaging in June 2019 was unremarkable for recurrence.  At this visit there is no evidence of disease  Continue letrozole as prescribed Follow-up in 3 months All questions were answered. The patient knows to call the clinic with any  problems, questions or concerns. We can certainly see the patient much sooner if necessary.   This note was electronically signed. Janie Morning, MD 09/09/2017

## 2017-09-09 NOTE — Patient Instructions (Signed)
Dr Skeet Latch wants you to follow up in our office in 3 months.  If you have any questions or concerns prior to your appointment, please contact our office.

## 2017-12-08 ENCOUNTER — Other Ambulatory Visit: Payer: Self-pay | Admitting: Gynecologic Oncology

## 2017-12-08 DIAGNOSIS — Z1231 Encounter for screening mammogram for malignant neoplasm of breast: Secondary | ICD-10-CM

## 2017-12-09 ENCOUNTER — Inpatient Hospital Stay: Payer: BLUE CROSS/BLUE SHIELD | Attending: Gynecologic Oncology | Admitting: Gynecologic Oncology

## 2017-12-09 ENCOUNTER — Encounter: Payer: Self-pay | Admitting: Gynecologic Oncology

## 2017-12-09 VITALS — BP 142/86 | HR 93 | Temp 98.0°F | Resp 16 | Ht 67.0 in | Wt 283.0 lb

## 2017-12-09 DIAGNOSIS — Z9221 Personal history of antineoplastic chemotherapy: Secondary | ICD-10-CM | POA: Diagnosis not present

## 2017-12-09 DIAGNOSIS — Z923 Personal history of irradiation: Secondary | ICD-10-CM | POA: Diagnosis not present

## 2017-12-09 DIAGNOSIS — Z90710 Acquired absence of both cervix and uterus: Secondary | ICD-10-CM | POA: Insufficient documentation

## 2017-12-09 DIAGNOSIS — Z90722 Acquired absence of ovaries, bilateral: Secondary | ICD-10-CM | POA: Insufficient documentation

## 2017-12-09 DIAGNOSIS — C541 Malignant neoplasm of endometrium: Secondary | ICD-10-CM | POA: Diagnosis not present

## 2017-12-09 NOTE — Patient Instructions (Signed)
Follow up with Dr. Skeet Latch in December as scheduled.   Call the office if you have any concerns prior to the appointment.

## 2017-12-09 NOTE — Progress Notes (Signed)
GYN ONCOLOGY OFFICE VISIT    Caitlyn Frees, MD [place] [address]   CHIEF COMPLAINT:  uterine carcinosarcoma, ovarian clear cell cancer; surveillance  : Cancer Staging Endometrial cancer ([place]) Staging form: Corpus Uteri - Carcinoma, AJCC 7th Edition - Clinical: Stage IVB (T3b, NX, M1, Free text: high grade carcinosarcoma M1) - Signed by Gordy Levan, MD on 05/03/2013 - Pathologic: Stage IVB (T3b, NX, M1) - Signed by Gordy Levan, MD on 05/03/2013  Left ovarian epithelial cancer ([place]) Staging form: Ovary, AJCC 7th Edition - Clinical: Stage IV (T1c, NX, M1) - Signed by Gordy Levan, MD on 09/09/2013 - Pathologic: No stage assigned - Unsigned   SUMMARY OF ONCOLOGIC HISTORY: Oncology History   Recurrent endometrial cancer, strong ER positive 90%, PR 90%, MSI stable Clear cell mixed features of ovaries in 2015 Recurrent disease 2018: low grade endometroid       Endometrial cancer ([place])   02/24/2013 Miscellaneous    Pulmonary embolus  IVC filter was placed and she was started on Lovenox.  She was subsequently transitioned to warfarin.     03/07/2013 Pathology Results    1. Cervix, biopsy - HIGH GRADE ENDOMETRIAL CARCINOMA, SEE COMMENT. 2. Endometrium, biopsy - HIGH GRADE ENDOMETRIAL CARCINOMA, SEE COMMENT. Microscopic Comment 1. , 2. In both parts 1 and 2, slide sections demonstrate extensive involvement by high grade endometrial carcinoma demonstrating a biphasic epithelial and stromal architecture. The immunophenotype (strong diffuse estrogen receptor; strong diffuse vimentin expression; patchy monoclonal CEA expression, patchy p16 immunostain expression, and diffuse mild to moderate p53 expression) is consistent with primary endometrial origin. 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      03/14/2013 - 01/04/2014 Chemotherapy    taxol carboplatin x 9    08/01/2013 Surgery    Procedure(s) Performed: 1. Exploratory laparotomy with total hysterectomy bilateral salpingo-oophorectomy, omental biopsies.  Surgeon: Francetta Found.  Skeet Latch, MD., PhD  Assistant Surgeon: Lahoma Crocker M.D.   Specimens: Uterus Cervix, Bilateral tubes / ovaries, lower  omentum.   Operative Findings: A 24cm left ovarian mass hemosiderine filled with omental adhesions.  Evidence of chemotherapeutic changes over the bowel.  Uterus 14 cm.  No palpable hepatic mets but the examination was limited by diaphragmatic adhesions.  No gross disease in the omentum. TAH BSO omentectomy Left ovary 20cm clear cell malignancy  Uterus Carcinosarcoma  Multiple microscopic foci, measuring up to 0.5 cm in greatest dimension. Histologic type: Carcinosarcoma (MMMT) with heterologous component (ossification) Grade: High grade Myometrial invasion: 2.1 cm where myometrium is 2.5 cm in thickness Cervical stromal involvement: No.    08/01/2013 Pathology Results    1. Adnexa - ovary +/- tube, neoplastic, left with omentum - HIGH GRADE CARCINOMA WITH BOTH CLEAR CELL CARCINOMA AND ENDOMETRIOID CARCINOMA COMPONENTS, 20 CM. PLEASE SEE ONCOLOGY TEMPLATE FOR DETAIL. 2. Uterus and cervix, with right fallopian tube and right ovary - MICROSCOPIC FOCI OF RESIDUAL CARCINOSARCOMA WITH HETEROLOGOUS ELEMENT, INVOLVING OUTER HALF OF THE MYOMETRIUM. - EXTENSIVE ADENOMYOSIS AND LEIOMYOMA. - CERVIX: BENIGN SQUAMOUS MUCOSA AND ENDOCERVICAL MUCOSA, NO DYSPLASIA OR MALIGNANCY. - RIGHT OVARY AND FALLOPIAN TUBES: NO PATHOLOGIC ABNORMALITIES. Microscopic Comment  1. OVARY Specimen(s): Left ovary and fallopian tube. Procedure: (including lymph node sampling): Left salpingo-oophorectomy Primary tumor site (including laterality): Left ovary Ovarian surface involvement: Present. Ovarian capsule intact without fragmentation: No. Maximum tumor  size (cm): 20 cm, gross measurement.  Histologic type: Mixed surface epithelial carcinoma with predominant clear cell carcinoma component (70%) and minor endometrioid component (30%) Please see comment for detail. Grade: High grade Peritoneal implants: (specify invasive or non-invasive): N/A Pelvic extension (list additional structures on separate lines and if involved): No. Lymph nodes: number examined - N/A ; number positive - N/A TNM code: ypT1c2, pNX FIGO Stage (based on pathologic findings, needs clinical correlation): at least IC2 Comments: Received grossly is a 20 cm focally disrupted and partially collapsed multicystic ovary with attached/adherent omentum. Microscopically, sectioned show an invasive high grade surface carcinoma arising in a background of endometriotic cyst. The tumor is predominately composed of clear cell carcinoma component with minor endometrioid carcinoma component. No tumor involvement is identified in the adherent omentum tissue. Immunohistochemical stains were performed and the clear cell carcinoma is patchy positive for p16, ER, p53, negative for WT-1 and p63. Focal angiolymphatic invasion is present  The morphologic features are different from those seen in the uterus. 2. ONCOLOGY TABLE-UTERUS, CARCINOSARCOMA Specimen: Uterus, cervix and right fallopian tubes and ovaries. Procedure: Total hysterectomy and right salpingo-oophorectomy. Lymph node sampling performed: No. Specimen integrity: Intact. Maximum tumor size: Multiple microscopic foci, measuring up to 0.5 cm in greatest dimension. Histologic type: Carcinosarcoma (MMMT) with heterologous component (ossification) Grade: High grade Myometrial invasion: 2.1 cm where myometrium is 2.5 cm in thickness Cervical stromal involvement: No. Extent of involvement of other organs: No. Lymph - vascular invasion: Not identified. Peritoneal washings: N/A Lymph nodes: # examined N/A ; # positive N/A Pelvic lymph  nodes: N/A involved of N/A lymph nodes. Para-aortic lymph nodes: N/A involved of N/A lymph nodes. Other (specify involvement and site): N/A TNM code: ypT1b, ypNX FIGO Stage (based on pathologic findings, needs clinical correlation): IB Comment: Immunohistochemical stains were performed and the residual carcinoma is positive for p16, negative for WT-1, weakly positive for p53 and ER. The morphologic features are different from those seen in the left ovary. (HCL:gt, 08/04/13)    02/21/2014 - 03/09/2014 Radiation Therapy    Received vaginal brachytherapy TREATMENT DATES: 12/2, 12/7, 12/11, 12/14 and 03/09/14  SITE/DOSE: Vaginal Cuff 4 cm treatment length / 30 Gy in 5 fractions  Technique: HDR with Ir-192     12/17/2016 Relapse/Recurrence    2cm rubbery like mass in the RV septum on physical examination    12/23/2016 Imaging    Reproductive: Previous hysterectomy. New soft tissue mass seen between the vaginal cuff and anterior rectal wall which measures 2.7 x 2.6 cm on image 112/ 2. This is consistent with locally recurrent endometrial carcinoma.    01/05/2017 Imaging    1. Previously noted pelvic mass is similar in size and appearance to the prior examination from 12/23/2016 demonstrating some low-level hypermetabolism. This is nonspecific, and could represent a lesion of rectal, cervical or uterine origin. 2. No definite findings to suggest metastatic disease in the chest, abdomen or pelvis. 3. Small focus of hypermetabolism in the distal third of the esophagus without a perceivable abnormality on the CT portion of the examination. This could represent physiologic activity, a focal area of esophagitis, or an underlying lesion. Clinical correlation is recommended, with consideration for followup upper endoscopy if clinically appropriate. 4. Nonspecific low-level hypermetabolism in a left axillary lymph node which is  stable in size and appearance compared to prior examinations. This is favored to be benign, but correlation with routine annual mammography is recommended. 5. Dilatation of the pulmonic trunk (4 cm in diameter), concerning for pulmonary arterial hypertension. 6. Severe hepatic steatosis.  01/05/2017 PET scan    1. Previously noted pelvic mass is similar in size and appearance to the prior examination from 12/23/2016 demonstrating some low-level hypermetabolism. This is nonspecific, and could represent a lesion of rectal, cervical or uterine origin. 2. No definite findings to suggest metastatic disease in the chest, abdomen or pelvis. 3. Small focus of hypermetabolism in the distal third of the esophagus without a perceivable abnormality on the CT portion of the examination. This could represent physiologic activity, a focal area of esophagitis, or an underlying lesion. Clinical correlation is recommended, with consideration for followup upper endoscopy if clinically appropriate. 4. Nonspecific low-level hypermetabolism in a left axillary lymph node which is stable in size and appearance compared to prior examinations. This is favored to be benign, but correlation with routine annual mammography is recommended. 5. Dilatation of the pulmonic trunk (4 cm in diameter), concerning for pulmonary arterial hypertension. 6. Severe hepatic steatosis. 7. Aortic atherosclerosis, in addition to left anterior descending coronary artery disease. Please note that although the presence of coronary artery calcium documents the presence of coronary artery disease, the severity of this disease and any potential stenosis cannot be assessed on this non-gated CT examination. Assessment for potential risk factor modification, dietary therapy or pharmacologic therapy may be warranted, if clinically indicated. 8. Additional incidental findings, as above. Aortic Atherosclerosis (ICD10-I70.0).    01/27/2017 Surgery     She underwent diagnostic laparoscopy, robotic assisted resection of intraperitoneal mass, infracolic omentectomy, peritoneal biopsies and rigid proctoscopy. R1 resection with miliary disease remaining in tumor bed    02/04/2017 Pathology Results    A: Cul-de-sac mass, biopsy  - Endometrioid adenocarcinoma, FIGO grade 1 (architecture grade 1, nuclear grade 2) (see comment)  B: Peritoneum, pelvic, biopsy  - Fragments of fibrovascular tissue with hemorrhage - No malignancy identified  C: Cul-de-sac, biopsy  - Endometrioid adenocarcinoma, FIGO grade 1 (architecture grade 1, nuclear grade 2), with squamous differentiation (see comment)  D: Omentum, omentectomy  - Benign omentum  - No malignancy identified  The purpose of this addendum is to report the results of immunohistochemical stains for mismatch repair proteins and hormone receptors performed on a representative block of tumor.  Immunostains for mismatch repair proteins were performed on block C2 and show intact expression of MLH1, PMS2, MSH2, and MSH6 within the tumor. This is the normal phenotype and does NOT support a diagnosis of microsatellite instability/hereditary non-polyposis colorectal cancer (HNPCC) associated carcinoma. Molecular testing for additional microsatellite instability markers will be performed by the [facility] [phone]) and reported separately.  Estrogen receptor (Ventana, clone SP1):  Positive (90%, weak to moderate)  Progesterone receptor (Ventana, clone 1E2):  Positive (90%, strong)    02/16/2017 Procedure    Successful placement of a right internal jugular approach power injectable Port-A-Cath. The catheter is ready for immediate use.    03/04/2017 - 06/17/2017 Chemotherapy    The patient had Carboplatin and Taxol for chemotherapy treatment.      06/24/2017 -  Anti-estrogen oral therapy    She started letrozole    08/23/2017 Imaging    1. Soft tissue lesion seen posterior to  the vagina on the previous PET-CT is not evident today. 2. Trace free fluid in the pelvis. 3. Similar appearance tiny lymph nodes scattered along both common and external iliac chains. 4. 12 mm low-density lesion in the central spleen appears stable. Attention on follow-up recommended.     Ovarian cancer ([facility])   08/01/2013 Initial Diagnosis    Ovarian  cancer ([facility]) Unstaged.  Incidental pathology finding    2016 Genetic Testing     Results revealed patient has the following mutation(s): NEGATIVE       INTERVAL HISTORY: Caitlyn Higgins 52 y.o. female returns for balance from recurrent endometrioid component of her uterine carcinosarcoma.  She feels well and is compliant with letrozole.  Reports recent weight gain and plans to go on the keto diet with her friend   Patient Active Problem List   Diagnosis Date Noted  . Deficiency anemia 07/23/2017  . Hot flashes related to aromatase inhibitor therapy 07/23/2017  . Steroid-induced hyperglycemia 05/27/2017  . Pancytopenia, acquired ([place]) 05/06/2017  . Mucositis due to antineoplastic therapy 03/25/2017  . Goals of care, counseling/discussion 02/22/2017  . Chemotherapy-induced neuropathy ([facility]) 09/24/2015  . Presence of IVC filter 08/14/2014  . Uterine carcinosarcoma ([facility]) 08/14/2014  . Morbid obesity ([place]) 02/19/2014  . Left thyroid nodule 11/08/2013  . Family history of malignant neoplasm of breast   . Ovarian cancer ([facility])   . Left ovarian epithelial cancer ([facility]) 09/09/2013  . Endometrial cancer ([place]) 03/10/2013  . HTN (hypertension) 02/22/2013    has No Known Allergies.  MEDICAL HISTORY: Past Medical History:  Diagnosis Date  . Anemia   . Cancer ([facility])    uterine carcinosarcoma  . DVT (deep venous thrombosis) ([facility])    right leg 12'14  . Elevated blood uric acid level    tx. Allopurinol  . Family history of malignant neoplasm of breast   . GERD (gastroesophageal reflux disease)   . History of chemotherapy   . Hx of  radiation therapy HDR   x 5 tx  completed 03/09/14  . Hypertension   . Ovarian cancer ([facility] [place]) 2015  . Pulmonary emboli ([place])    12'14 denies any sob  . Reflux   . Transfusion history 07-26-13   12'14, 04-14-13(2 units), 06-09-13    SURGICAL HISTORY: Past Surgical History:  Procedure Laterality Date  . ABDOMINAL HYSTERECTOMY N/A 08/01/2013   Procedure: EXPLORATORY LAPAROTOMY/HYSTERECTOMY TOTAL ABDOMINAL;  Surgeon: Janie Morning, MD;  Location: [facility];  Service: Gynecology;  Laterality: N/A;  . IR FLUORO GUIDE PORT INSERTION RIGHT  02/16/2017  . IR US GUIDE VASC ACCESS RIGHT  02/16/2017  . IVC Right    filter placed to prevent Pulmonary emboli  . PORTACATH PLACEMENT Right    rigth chest- remains  . removed tumor from rectum    . SALPINGOOPHORECTOMY Bilateral 08/01/2013   Procedure: BILATERAL SALPINGO OOPHORECTOMY/OMENTECTOMY ;  Surgeon: Janie Morning, MD;  Location: [facility];  Service: Gynecology;  Laterality: Bilateral;    SOCIAL HISTORY: Social History   Socioeconomic History  . Marital status: Married    Spouse name: Not on file  . Number of children: Not on file  . Years of education: Not on file  . Highest education level: Not on file  Occupational History  . Not on file  Social Needs  . Financial resource strain: Not on file  . Food insecurity:    Worry: Not on file    Inability: Not on file  . Transportation needs:    Medical: Not on file    Non-medical: Not on file  Tobacco Use  . Smoking status: Never Smoker  . Smokeless tobacco: Never Used  Substance and Sexual Activity  . Alcohol use: No    Comment: Quit  2 yrs ago-social in past  . Drug use: No  . Sexual activity: Yes  Lifestyle  . Physical activity:  Days per week: Not on file    Minutes per session: Not on file  . Stress: Not on file  Relationships  . Social connections:    Talks on phone: Not on file    Gets together: Not on file    Attends religious service: Not on file    Active member of club  or organization: Not on file    Attends meetings of clubs or organizations: Not on file    Relationship status: Not on file  . Intimate partner violence:    Fear of current or ex partner: Not on file    Emotionally abused: Not on file    Physically abused: Not on file    Forced sexual activity: Not on file  Other Topics Concern  . Not on file  Social History Narrative  . Not on file  Donates to the care of her mother with dementia with her 2 sisters.  The foster children are growing and she has much joy in her family life.  Her church family provides a great deal of emotional and spiritual support   FAMILY HISTORY: Family History  Problem Relation Age of Onset  . Diabetes Mother   . Hypertension Mother   . Hypertension Father   . Diabetes Father   . Prostate cancer Father 12       deceased 81  . Breast cancer Maternal Aunt 74       deceased 69  . Prostate cancer Maternal Uncle        5 of 6 mat uncles with prostate cancer; deceased  . Breast cancer Paternal Aunt 45       deceased 9  . Stomach cancer Paternal Grandmother        deceased 62  . Breast cancer Maternal Aunt 95       deceased 67  . Breast cancer Cousin 56       negative BRCAplus, PALB2 2015  . Breast cancer Paternal Aunt 92       deceased 19  . Thyroid disease Neg Hx     Review of Systems  Constitutional: Negative.  Negative for appetite change, chills, diaphoresis, fatigue and fever.  HENT:  Negative.   Eyes: Negative.   Respiratory: Negative.  Negative for cough and shortness of breath.   Cardiovascular: Negative.  Negative for chest pain and leg swelling.  Gastrointestinal: Negative.  Negative for abdominal distention, abdominal pain, blood in stool and constipation.       Reports a couple of episodes of pulling-like comfort in the left lower quadrant  Endocrine: Positive for hot flashes.  Genitourinary: Negative.    Musculoskeletal: Negative.   Skin: Negative.   Neurological: Negative.    Hematological: Negative.   Psychiatric/Behavioral: Negative.       PHYSICAL EXAMINATION  ECOG PERFORMANCE STATUS: 0 - Asymptomatic  Vitals:   12/09/17 0912 12/09/17 0920  BP: (!) 164/86 (!) 142/86  Pulse: 93   Resp: 16   Temp: 98 F (36.7 C)    Wt Readings from Last 3 Encounters:  12/09/17 283 lb (128.4 kg)  09/09/17 279 lb 3.2 oz (126.6 kg)  08/23/17 277 lb 4.8 oz (125.8 kg)     Physical Exam  Constitutional: She is oriented to person, place, and time. She appears well-developed and well-nourished. No distress.  HENT:  Head: Normocephalic and atraumatic.  Eyes: No scleral icterus.  Neck: Normal range of motion. Neck supple. No JVD present. No tracheal deviation present.  Cardiovascular: Normal rate, regular rhythm  and normal heart sounds.  Pulmonary/Chest: Effort normal and breath sounds normal.  Abdominal: Soft. Normal appearance and bowel sounds are normal. She exhibits no distension, no fluid wave, no ascites and no mass. There is no hepatosplenomegaly. There is no tenderness. There is no CVA tenderness. No hernia.  Genitourinary: Vagina normal. Rectal exam shows external hemorrhoid. Rectal exam shows no mass, no tenderness and anal tone normal. Pelvic exam was performed with patient supine. Right adnexum displays no mass, no tenderness and no fullness. Left adnexum displays no mass, no tenderness and no fullness. No tenderness or bleeding in the vagina. No vaginal discharge found.  Genitourinary Comments: Left vaginal apex without nodularity, on rectal examination the thickening at the prior site of recurrence is unchanged from prior lamination  Musculoskeletal: She exhibits no edema.  Neurological: She is alert and oriented to person, place, and time.  Skin: No burn, no laceration and no lesion noted. No cyanosis. Nails show no clubbing.  Psychiatric: Her speech is normal and behavior is normal. Thought content normal. Her mood appears not anxious. Her affect is not  blunt and not labile. Cognition and memory are normal. She does not exhibit a depressed mood.    ASSESSMENT and THERAPY PLAN:  Caitlyn Higgins is a 52 y.o. 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  Endometrioid recurrence was identified in October 2018.  Surgical resection and chemotherapy with Taxol carboplatin was completed March 2019.  Imaging in June 2019 was unremarkable for recurrence.  At this visit there is no evidence of disease  Continue letrozole as prescribed Follow-up in 3 months  [place] reports 1 week of pulling vaginal apical discomfort. If this discomfort continues for the next week or so we will repeat imaging Follow-up December 2019  Obesity Body mass index is 44.32 kg/m. Has plans to begin a diet  This note was electronically signed. Janie Morning, MD 12/09/2017

## 2017-12-14 ENCOUNTER — Other Ambulatory Visit: Payer: Self-pay | Admitting: Gynecologic Oncology

## 2017-12-14 DIAGNOSIS — C541 Malignant neoplasm of endometrium: Secondary | ICD-10-CM

## 2018-01-14 ENCOUNTER — Ambulatory Visit
Admission: RE | Admit: 2018-01-14 | Discharge: 2018-01-14 | Disposition: A | Discharge status: Home / Self Care | Payer: BLUE CROSS/BLUE SHIELD | Source: Ambulatory Visit | Attending: Gynecologic Oncology | Admitting: Gynecologic Oncology

## 2018-01-14 ENCOUNTER — Ambulatory Visit: Payer: BLUE CROSS/BLUE SHIELD

## 2018-01-14 DIAGNOSIS — Z1231 Encounter for screening mammogram for malignant neoplasm of breast: Secondary | ICD-10-CM

## 2018-03-09 NOTE — Progress Notes (Deleted)
GYN ONCOLOGY OFFICE VISIT    Caitlyn Frees, MD [place] [address]   CHIEF COMPLAINT:  uterine carcinosarcoma, ovarian clear cell cancer; surveillance  : Cancer Staging Endometrial cancer ([place]) Staging form: Corpus Uteri - Carcinoma, AJCC 7th Edition - Clinical: Stage IVB (T3b, NX, M1, Free text: high grade carcinosarcoma M1) - Signed by Gordy Levan, MD on 05/03/2013 - Pathologic: Stage IVB (T3b, NX, M1) - Signed by Gordy Levan, MD on 05/03/2013  Left ovarian epithelial cancer ([place]) Staging form: Ovary, AJCC 7th Edition - Clinical: Stage IV (T1c, NX, M1) - Signed by Gordy Levan, MD on 09/09/2013 - Pathologic: No stage assigned - Unsigned   SUMMARY OF ONCOLOGIC HISTORY: Oncology History   Recurrent endometrial cancer, strong ER positive 90%, PR 90%, MSI stable Clear cell mixed features of ovaries in 2015 Recurrent disease 2018: low grade endometroid       Endometrial cancer ([place])   02/24/2013 Miscellaneous    Pulmonary embolus  IVC filter was placed and she was started on Lovenox.  She was subsequently transitioned to warfarin.     03/07/2013 Pathology Results    1. Cervix, biopsy - HIGH GRADE ENDOMETRIAL CARCINOMA, SEE COMMENT. 2. Endometrium, biopsy - HIGH GRADE ENDOMETRIAL CARCINOMA, SEE COMMENT. Microscopic Comment 1. , 2. In both parts 1 and 2, slide sections demonstrate extensive involvement by high grade endometrial carcinoma demonstrating a biphasic epithelial and stromal architecture. The immunophenotype (strong diffuse estrogen receptor; strong diffuse vimentin expression; patchy monoclonal CEA expression, patchy p16 immunostain expression, and diffuse mild to moderate p53 expression) is consistent with primary endometrial origin. 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      03/14/2013 - 01/04/2014 Chemotherapy    taxol carboplatin x 9    08/01/2013 Surgery    Procedure(s) Performed: 1. Exploratory laparotomy with total hysterectomy bilateral salpingo-oophorectomy, omental biopsies.  Surgeon: Francetta Found.  Skeet Latch, MD., PhD  Assistant Surgeon: Lahoma Crocker M.D.   Specimens: Uterus Cervix, Bilateral tubes / ovaries, lower  omentum.   Operative Findings: A 24cm left ovarian mass hemosiderine filled with omental adhesions.  Evidence of chemotherapeutic changes over the bowel.  Uterus 14 cm.  No palpable hepatic mets but the examination was limited by diaphragmatic adhesions.  No gross disease in the omentum. TAH BSO omentectomy Left ovary 20cm clear cell malignancy  Uterus Carcinosarcoma  Multiple microscopic foci, measuring up to 0.5 cm in greatest dimension. Histologic type: Carcinosarcoma (MMMT) with heterologous component (ossification) Grade: High grade Myometrial invasion: 2.1 cm where myometrium is 2.5 cm in thickness Cervical stromal involvement: No.    08/01/2013 Pathology Results    1. Adnexa - ovary +/- tube, neoplastic, left with omentum - HIGH GRADE CARCINOMA WITH BOTH CLEAR CELL CARCINOMA AND ENDOMETRIOID CARCINOMA COMPONENTS, 20 CM. PLEASE SEE ONCOLOGY TEMPLATE FOR DETAIL. 2. Uterus and cervix, with right fallopian tube and right ovary - MICROSCOPIC FOCI OF RESIDUAL CARCINOSARCOMA WITH HETEROLOGOUS ELEMENT, INVOLVING OUTER HALF OF THE MYOMETRIUM. - EXTENSIVE ADENOMYOSIS AND LEIOMYOMA. - CERVIX: BENIGN SQUAMOUS MUCOSA AND ENDOCERVICAL MUCOSA, NO DYSPLASIA OR MALIGNANCY. - RIGHT OVARY AND FALLOPIAN TUBES: NO PATHOLOGIC ABNORMALITIES. Microscopic Comment  1. OVARY Specimen(s): Left ovary and fallopian tube. Procedure: (including lymph node sampling): Left salpingo-oophorectomy Primary tumor site (including laterality): Left ovary Ovarian surface involvement: Present. Ovarian capsule intact without fragmentation: No. Maximum tumor  size (cm): 20 cm, gross measurement.  Histologic type: Mixed surface epithelial carcinoma with predominant clear cell carcinoma component (70%) and minor endometrioid component (30%) Please see comment for detail. Grade: High grade Peritoneal implants: (specify invasive or non-invasive): N/A Pelvic extension (list additional structures on separate lines and if involved): No. Lymph nodes: number examined - N/A ; number positive - N/A TNM code: ypT1c2, pNX FIGO Stage (based on pathologic findings, needs clinical correlation): at least IC2 Comments: Received grossly is a 20 cm focally disrupted and partially collapsed multicystic ovary with attached/adherent omentum. Microscopically, sectioned show an invasive high grade surface carcinoma arising in a background of endometriotic cyst. The tumor is predominately composed of clear cell carcinoma component with minor endometrioid carcinoma component. No tumor involvement is identified in the adherent omentum tissue. Immunohistochemical stains were performed and the clear cell carcinoma is patchy positive for p16, ER, p53, negative for WT-1 and p63. Focal angiolymphatic invasion is present  The morphologic features are different from those seen in the uterus. 2. ONCOLOGY TABLE-UTERUS, CARCINOSARCOMA Specimen: Uterus, cervix and right fallopian tubes and ovaries. Procedure: Total hysterectomy and right salpingo-oophorectomy. Lymph node sampling performed: No. Specimen integrity: Intact. Maximum tumor size: Multiple microscopic foci, measuring up to 0.5 cm in greatest dimension. Histologic type: Carcinosarcoma (MMMT) with heterologous component (ossification) Grade: High grade Myometrial invasion: 2.1 cm where myometrium is 2.5 cm in thickness Cervical stromal involvement: No. Extent of involvement of other organs: No. Lymph - vascular invasion: Not identified. Peritoneal washings: N/A Lymph nodes: # examined N/A ; # positive N/A Pelvic lymph  nodes: N/A involved of N/A lymph nodes. Para-aortic lymph nodes: N/A involved of N/A lymph nodes. Other (specify involvement and site): N/A TNM code: ypT1b, ypNX FIGO Stage (based on pathologic findings, needs clinical correlation): IB Comment: Immunohistochemical stains were performed and the residual carcinoma is positive for p16, negative for WT-1, weakly positive for p53 and ER. The morphologic features are different from those seen in the left ovary. (HCL:gt, 08/04/13)    02/21/2014 - 03/09/2014 Radiation Therapy    Received vaginal brachytherapy TREATMENT DATES: 12/2, 12/7, 12/11, 12/14 and 03/09/14  SITE/DOSE: Vaginal Cuff 4 cm treatment length / 30 Gy in 5 fractions  Technique: HDR with Ir-192     12/17/2016 Relapse/Recurrence    2cm rubbery like mass in the RV septum on physical examination    12/23/2016 Imaging    Reproductive: Previous hysterectomy. New soft tissue mass seen between the vaginal cuff and anterior rectal wall which measures 2.7 x 2.6 cm on image 112/ 2. This is consistent with locally recurrent endometrial carcinoma.    01/05/2017 Imaging    1. Previously noted pelvic mass is similar in size and appearance to the prior examination from 12/23/2016 demonstrating some low-level hypermetabolism. This is nonspecific, and could represent a lesion of rectal, cervical or uterine origin. 2. No definite findings to suggest metastatic disease in the chest, abdomen or pelvis. 3. Small focus of hypermetabolism in the distal third of the esophagus without a perceivable abnormality on the CT portion of the examination. This could represent physiologic activity, a focal area of esophagitis, or an underlying lesion. Clinical correlation is recommended, with consideration for followup upper endoscopy if clinically appropriate. 4. Nonspecific low-level hypermetabolism in a left axillary lymph node which is  stable in size and appearance compared to prior examinations. This is favored to be benign, but correlation with routine annual mammography is recommended. 5. Dilatation of the pulmonic trunk (4 cm in diameter), concerning for pulmonary arterial hypertension. 6. Severe hepatic steatosis.  01/05/2017 PET scan    1. Previously noted pelvic mass is similar in size and appearance to the prior examination from 12/23/2016 demonstrating some low-level hypermetabolism. This is nonspecific, and could represent a lesion of rectal, cervical or uterine origin. 2. No definite findings to suggest metastatic disease in the chest, abdomen or pelvis. 3. Small focus of hypermetabolism in the distal third of the esophagus without a perceivable abnormality on the CT portion of the examination. This could represent physiologic activity, a focal area of esophagitis, or an underlying lesion. Clinical correlation is recommended, with consideration for followup upper endoscopy if clinically appropriate. 4. Nonspecific low-level hypermetabolism in a left axillary lymph node which is stable in size and appearance compared to prior examinations. This is favored to be benign, but correlation with routine annual mammography is recommended. 5. Dilatation of the pulmonic trunk (4 cm in diameter), concerning for pulmonary arterial hypertension. 6. Severe hepatic steatosis. 7. Aortic atherosclerosis, in addition to left anterior descending coronary artery disease. Please note that although the presence of coronary artery calcium documents the presence of coronary artery disease, the severity of this disease and any potential stenosis cannot be assessed on this non-gated CT examination. Assessment for potential risk factor modification, dietary therapy or pharmacologic therapy may be warranted, if clinically indicated. 8. Additional incidental findings, as above. Aortic Atherosclerosis (ICD10-I70.0).    01/27/2017 Surgery     She underwent diagnostic laparoscopy, robotic assisted resection of intraperitoneal mass, infracolic omentectomy, peritoneal biopsies and rigid proctoscopy. R1 resection with miliary disease remaining in tumor bed    02/04/2017 Pathology Results    A: Cul-de-sac mass, biopsy  - Endometrioid adenocarcinoma, FIGO grade 1 (architecture grade 1, nuclear grade 2) (see comment)  B: Peritoneum, pelvic, biopsy  - Fragments of fibrovascular tissue with hemorrhage - No malignancy identified  C: Cul-de-sac, biopsy  - Endometrioid adenocarcinoma, FIGO grade 1 (architecture grade 1, nuclear grade 2), with squamous differentiation (see comment)  D: Omentum, omentectomy  - Benign omentum  - No malignancy identified  The purpose of this addendum is to report the results of immunohistochemical stains for mismatch repair proteins and hormone receptors performed on a representative block of tumor.  Immunostains for mismatch repair proteins were performed on block C2 and show intact expression of MLH1, PMS2, MSH2, and MSH6 within the tumor. This is the normal phenotype and does NOT support a diagnosis of microsatellite instability/hereditary non-polyposis colorectal cancer (HNPCC) associated carcinoma. Molecular testing for additional microsatellite instability markers will be performed by the [facility] [phone]) and reported separately.  Estrogen receptor (Ventana, clone SP1):  Positive (90%, weak to moderate)  Progesterone receptor (Ventana, clone 1E2):  Positive (90%, strong)    02/16/2017 Procedure    Successful placement of a right internal jugular approach power injectable Port-A-Cath. The catheter is ready for immediate use.    03/04/2017 - 06/17/2017 Chemotherapy    The patient had Carboplatin and Taxol for chemotherapy treatment.      06/24/2017 -  Anti-estrogen oral therapy    She started letrozole    08/23/2017 Imaging    1. Soft tissue lesion seen posterior to  the vagina on the previous PET-CT is not evident today. 2. Trace free fluid in the pelvis. 3. Similar appearance tiny lymph nodes scattered along both common and external iliac chains. 4. 12 mm low-density lesion in the central spleen appears stable. Attention on follow-up recommended.     Ovarian cancer ([place])   08/01/2013 Initial Diagnosis    Ovarian  cancer ([place]) Unstaged.  Incidental pathology finding Adnexa - ovary +/- tube, neoplastic, left with omentum - HIGH GRADE CARCINOMA WITH BOTH CLEAR CELL CARCINOMA AND ENDOMETRIOID CARCINOMA COMPONENTS, 20 CM.Marland Kitchen    2016 Genetic Testing     Results revealed patient has the following mutation(s): NEGATIVE       INTERVAL HISTORY: Caitlyn Higgins 52 y.o. female returns for balance from recurrent endometrioid component of her uterine carcinosarcoma.  She feels well and is compliant with letrozole.  Reports recent weight gain and plans to go on the keto diet with her friend   Patient Active Problem List   Diagnosis Date Noted  . Deficiency anemia 07/23/2017  . Hot flashes related to aromatase inhibitor therapy 07/23/2017  . Steroid-induced hyperglycemia 05/27/2017  . Pancytopenia, acquired ([place]) 05/06/2017  . Mucositis due to antineoplastic therapy 03/25/2017  . Goals of care, counseling/discussion 02/22/2017  . Chemotherapy-induced neuropathy ([place]) 09/24/2015  . Presence of IVC filter 08/14/2014  . Uterine carcinosarcoma ([place]) 08/14/2014  . Morbid obesity ([place]) 02/19/2014  . Left thyroid nodule 11/08/2013  . Family history of malignant neoplasm of breast   . Ovarian cancer ([place])   . Left ovarian epithelial cancer ([place]) 09/09/2013  . Endometrial cancer ([place]) 03/10/2013  . HTN (hypertension) 02/22/2013    has No Known Allergies.  MEDICAL HISTORY: Past Medical History:  Diagnosis Date  . Anemia   . Cancer ([facility])    uterine carcinosarcoma  . DVT (deep venous thrombosis) ([facility])    right leg 12'14  . Elevated blood uric acid  level    tx. Allopurinol  . Family history of malignant neoplasm of breast   . GERD (gastroesophageal reflux disease)   . History of chemotherapy   . Hx of radiation therapy HDR   x 5 tx  completed 03/09/14  . Hypertension   . Ovarian cancer ([place]) 2015  . Pulmonary emboli ([place])    12'14 denies any sob  . Reflux   . Transfusion history 07-26-13   12'14, 04-14-13(2 units), 06-09-13    SURGICAL HISTORY: Past Surgical History:  Procedure Laterality Date  . ABDOMINAL HYSTERECTOMY N/A 08/01/2013   Procedure: EXPLORATORY LAPAROTOMY/HYSTERECTOMY TOTAL ABDOMINAL;  Surgeon: Janie Morning, MD;  Location: [facility];  Service: Gynecology;  Laterality: N/A;  . IR FLUORO GUIDE PORT INSERTION RIGHT  02/16/2017  . IR US GUIDE VASC ACCESS RIGHT  02/16/2017  . IVC Right    filter placed to prevent Pulmonary emboli  . PORTACATH PLACEMENT Right    rigth chest- remains  . removed tumor from rectum    . SALPINGOOPHORECTOMY Bilateral 08/01/2013   Procedure: BILATERAL SALPINGO OOPHORECTOMY/OMENTECTOMY ;  Surgeon: Janie Morning, MD;  Location: [facility];  Service: Gynecology;  Laterality: Bilateral;    SOCIAL HISTORY: Social History   Socioeconomic History  . Marital status: Married    Spouse name: Not on file  . Number of children: Not on file  . Years of education: Not on file  . Highest education level: Not on file  Occupational History  . Not on file  Social Needs  . Financial resource strain: Not on file  . Food insecurity:    Worry: Not on file    Inability: Not on file  . Transportation needs:    Medical: Not on file    Non-medical: Not on file  Tobacco Use  . Smoking status: Never Smoker  . Smokeless tobacco: Never Used  Substance and Sexual Activity  . Alcohol use: No    Comment: Quit  2 yrs ago-social in past  . Drug use: No  . Sexual activity: Yes  Lifestyle  . Physical activity:    Days per week: Not on file    Minutes per session: Not on file  . Stress: Not on file   Relationships  . Social connections:    Talks on phone: Not on file    Gets together: Not on file    Attends religious service: Not on file    Active member of club or organization: Not on file    Attends meetings of clubs or organizations: Not on file    Relationship status: Not on file  . Intimate partner violence:    Fear of current or ex partner: Not on file    Emotionally abused: Not on file    Physically abused: Not on file    Forced sexual activity: Not on file  Other Topics Concern  . Not on file  Social History Narrative  . Not on file  Donates to the care of her mother with dementia with her 2 sisters.  The foster children are growing and she has much joy in her family life.  Her church family provides a great deal of emotional and spiritual support   FAMILY HISTORY: Family History  Problem Relation Age of Onset  . Diabetes Mother   . Hypertension Mother   . Hypertension Father   . Diabetes Father   . Prostate cancer Father 3       deceased 28  . Breast cancer Maternal Aunt 74       deceased 59  . Prostate cancer Maternal Uncle        5 of 6 mat uncles with prostate cancer; deceased  . Breast cancer Paternal Aunt 30       deceased 50  . Stomach cancer Paternal Grandmother        deceased 60  . Breast cancer Maternal Aunt 71       deceased 46  . Breast cancer Cousin 47       negative BRCAplus, PALB2 2015  . Breast cancer Paternal Aunt 29       deceased 40  . Thyroid disease Neg Hx     Review of Systems  Constitutional: Negative.  Negative for appetite change, chills, diaphoresis, fatigue and fever.  HENT:  Negative.   Eyes: Negative.   Respiratory: Negative.  Negative for cough and shortness of breath.   Cardiovascular: Negative.  Negative for chest pain and leg swelling.  Gastrointestinal: Negative.  Negative for abdominal distention, abdominal pain, blood in stool and constipation.       Reports a couple of episodes of pulling-like comfort in the  left lower quadrant  Endocrine: Positive for hot flashes.  Genitourinary: Negative.    Musculoskeletal: Negative.   Skin: Negative.   Neurological: Negative.   Hematological: Negative.   Psychiatric/Behavioral: Negative.       PHYSICAL EXAMINATION  ECOG PERFORMANCE STATUS: 0 - Asymptomatic  There were no vitals filed for this visit. Wt Readings from Last 3 Encounters:  12/09/17 283 lb (128.4 kg)  09/09/17 279 lb 3.2 oz (126.6 kg)  08/23/17 277 lb 4.8 oz (125.8 kg)     Physical Exam Constitutional:      General: She is not in acute distress.    Appearance: Normal appearance. She is well-developed.  HENT:     Head: Normocephalic and atraumatic.  Eyes:     General: No scleral icterus. Neck:     Musculoskeletal:  Normal range of motion and neck supple.     Vascular: No JVD.     Trachea: No tracheal deviation.  Cardiovascular:     Rate and Rhythm: Normal rate and regular rhythm.     Heart sounds: Normal heart sounds.  Pulmonary:     Effort: Pulmonary effort is normal.     Breath sounds: Normal breath sounds.  Abdominal:     General: Bowel sounds are normal. There is no distension.     Palpations: Abdomen is soft. There is no fluid wave or mass.     Tenderness: There is no abdominal tenderness.     Hernia: No hernia is present.  Genitourinary:    Exam position: Supine.     Vagina: Normal. No vaginal discharge, tenderness or bleeding.     Adnexa:        Right: No mass, tenderness or fullness.         Left: No mass, tenderness or fullness.       Rectum: External hemorrhoid present. No mass or tenderness. Normal anal tone.     Comments: Left vaginal apex without nodularity, on rectal examination the thickening at the prior site of recurrence is unchanged from prior lamination Skin:    Findings: No burn, laceration or lesion.     Nails: There is no clubbing.   Neurological:     Mental Status: She is alert and oriented to person, place, and time.  Psychiatric:         Mood and Affect: Mood is not anxious or depressed. Affect is not labile or blunt.        Speech: Speech normal.        Behavior: Behavior normal.        Thought Content: Thought content normal.     ASSESSMENT and THERAPY PLAN:  Caitlyn Higgins is a 52 y.o. 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  Endometrioid recurrence was identified in October 2018.  Surgical resection and chemotherapy with Taxol carboplatin was completed March 2019.  Imaging in June 2019 was unremarkable for recurrence.  At this visit there is no evidence of disease  Continue letrozole as prescribed Follow-up in 3 months  [facility] reports 1 week of pulling vaginal apical discomfort. If this discomfort continues for the next week or so we will repeat imaging Follow-up December 2019  Obesity There is no height or weight on file to calculate BMI. Has plans to begin a diet  This note was electronically signed. Janie Morning, MD 03/09/2018

## 2018-03-10 ENCOUNTER — Inpatient Hospital Stay: Payer: BLUE CROSS/BLUE SHIELD | Admitting: Gynecologic Oncology

## 2018-03-10 ENCOUNTER — Telehealth: Payer: Self-pay | Admitting: *Deleted

## 2018-03-10 NOTE — Telephone Encounter (Signed)
Called and moved the patient's appt from today to January 9th

## 2018-03-30 NOTE — Progress Notes (Signed)
GYN ONCOLOGY OFFICE VISIT    Shirline Frees, MD [place] [address]   CHIEF COMPLAINT:  uterine carcinosarcoma, ovarian clear cell cancer; left lower quadrant pain  : Cancer Staging Endometrial cancer ([place]) Staging form: Corpus Uteri - Carcinoma, AJCC 7th Edition - Clinical: Stage IVB (T3b, NX, M1, Free text: high grade carcinosarcoma M1) - Signed by Gordy Levan, MD on 05/03/2013 - Pathologic: Stage IVB (T3b, NX, M1) - Signed by Gordy Levan, MD on 05/03/2013  Left ovarian epithelial cancer ([place]) Staging form: Ovary, AJCC 7th Edition - Clinical: Stage IV (T1c, NX, M1) - Signed by Gordy Levan, MD on 09/09/2013 - Pathologic: No stage assigned - Unsigned   SUMMARY OF ONCOLOGIC HISTORY: Oncology History   Recurrent endometrial cancer, strong ER positive 90%, PR 90%, MSI stable Clear cell mixed features of ovaries in 2015 Recurrent disease 2018: low grade endometroid       Endometrial cancer ([place])   02/24/2013 Miscellaneous    Pulmonary embolus  IVC filter was placed and she was started on Lovenox.  She was subsequently transitioned to warfarin.     03/07/2013 Pathology Results    1. Cervix, biopsy - HIGH GRADE ENDOMETRIAL CARCINOMA, SEE COMMENT. 2. Endometrium, biopsy - HIGH GRADE ENDOMETRIAL CARCINOMA, SEE COMMENT. Microscopic Comment 1. , 2. In both parts 1 and 2, slide sections demonstrate extensive involvement by high grade endometrial carcinoma demonstrating a biphasic epithelial and stromal architecture. The immunophenotype (strong diffuse estrogen receptor; strong diffuse vimentin expression; patchy monoclonal CEA expression, patchy p16 immunostain expression, and diffuse mild to moderate p53 expression) is consistent with primary endometrial origin. 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     03/14/2013 - 01/04/2014 Chemotherapy    taxol carboplatin x 9    08/01/2013 Surgery    Procedure(s) Performed: 1. Exploratory laparotomy with total hysterectomy bilateral salpingo-oophorectomy, omental biopsies.  Surgeon: Francetta Found.  Skeet Latch, MD., PhD  Assistant Surgeon: Lahoma Crocker M.D.   Specimens: Uterus Cervix, Bilateral tubes / ovaries, lower  omentum.   Operative Findings: A 24cm left ovarian mass hemosiderine filled with omental adhesions.  Evidence of chemotherapeutic changes over the bowel.  Uterus 14 cm.  No palpable hepatic mets but the examination was limited by diaphragmatic adhesions.  No gross disease in the omentum. TAH BSO omentectomy Left ovary 20cm clear cell malignancy  Uterus Carcinosarcoma  Multiple microscopic foci, measuring up to 0.5 cm in greatest dimension. Histologic type: Carcinosarcoma (MMMT) with heterologous component (ossification) Grade: High grade Myometrial invasion: 2.1 cm where myometrium is 2.5 cm in thickness Cervical stromal involvement: No.    08/01/2013 Pathology Results    1. Adnexa - ovary +/- tube, neoplastic, left with omentum - HIGH GRADE CARCINOMA WITH BOTH CLEAR CELL CARCINOMA AND ENDOMETRIOID CARCINOMA COMPONENTS, 20 CM. PLEASE SEE ONCOLOGY TEMPLATE FOR DETAIL. 2. Uterus and cervix, with right fallopian tube and right ovary - MICROSCOPIC FOCI OF RESIDUAL CARCINOSARCOMA WITH HETEROLOGOUS ELEMENT, INVOLVING OUTER HALF OF THE MYOMETRIUM. - EXTENSIVE ADENOMYOSIS AND LEIOMYOMA. - CERVIX: BENIGN SQUAMOUS MUCOSA AND ENDOCERVICAL MUCOSA, NO DYSPLASIA OR MALIGNANCY. - RIGHT OVARY AND FALLOPIAN TUBES: NO PATHOLOGIC ABNORMALITIES. Microscopic Comment  1. OVARY Specimen(s): Left ovary and fallopian tube. Procedure: (including lymph node sampling): Left salpingo-oophorectomy Primary tumor site (including laterality): Left ovary Ovarian surface involvement: Present. Ovarian capsule intact without fragmentation:  No. Maximum tumor size (cm): 20  cm, gross measurement. Histologic type: Mixed surface epithelial carcinoma with predominant clear cell carcinoma component (70%) and minor endometrioid component (30%) Please see comment for detail. Grade: High grade Peritoneal implants: (specify invasive or non-invasive): N/A Pelvic extension (list additional structures on separate lines and if involved): No. Lymph nodes: number examined - N/A ; number positive - N/A TNM code: ypT1c2, pNX FIGO Stage (based on pathologic findings, needs clinical correlation): at least IC2 Comments: Received grossly is a 20 cm focally disrupted and partially collapsed multicystic ovary with attached/adherent omentum. Microscopically, sectioned show an invasive high grade surface carcinoma arising in a background of endometriotic cyst. The tumor is predominately composed of clear cell carcinoma component with minor endometrioid carcinoma component. No tumor involvement is identified in the adherent omentum tissue. Immunohistochemical stains were performed and the clear cell carcinoma is patchy positive for p16, ER, p53, negative for WT-1 and p63. Focal angiolymphatic invasion is present  The morphologic features are different from those seen in the uterus. 2. ONCOLOGY TABLE-UTERUS, CARCINOSARCOMA Specimen: Uterus, cervix and right fallopian tubes and ovaries. Procedure: Total hysterectomy and right salpingo-oophorectomy. Lymph node sampling performed: No. Specimen integrity: Intact. Maximum tumor size: Multiple microscopic foci, measuring up to 0.5 cm in greatest dimension. Histologic type: Carcinosarcoma (MMMT) with heterologous component (ossification) Grade: High grade Myometrial invasion: 2.1 cm where myometrium is 2.5 cm in thickness Cervical stromal involvement: No. Extent of involvement of other organs: No. Lymph - vascular invasion: Not identified. Peritoneal washings: N/A Lymph nodes: # examined N/A ; # positive  N/A Pelvic lymph nodes: N/A involved of N/A lymph nodes. Para-aortic lymph nodes: N/A involved of N/A lymph nodes. Other (specify involvement and site): N/A TNM code: ypT1b, ypNX FIGO Stage (based on pathologic findings, needs clinical correlation): IB Comment: Immunohistochemical stains were performed and the residual carcinoma is positive for p16, negative for WT-1, weakly positive for p53 and ER. The morphologic features are different from those seen in the left ovary. (HCL:gt, 08/04/13)    02/21/2014 - 03/09/2014 Radiation Therapy    Received vaginal brachytherapy TREATMENT DATES: 12/2, 12/7, 12/11, 12/14 and 03/09/14  SITE/DOSE: Vaginal Cuff 4 cm treatment length / 30 Gy in 5 fractions  Technique: HDR with Ir-192     12/17/2016 Relapse/Recurrence    2cm rubbery like mass in the RV septum on physical examination    12/23/2016 Imaging    Reproductive: Previous hysterectomy. New soft tissue mass seen between the vaginal cuff and anterior rectal wall which measures 2.7 x 2.6 cm on image 112/ 2. This is consistent with locally recurrent endometrial carcinoma.    01/05/2017 Imaging    1. Previously noted pelvic mass is similar in size and appearance to the prior examination from 12/23/2016 demonstrating some low-level hypermetabolism. This is nonspecific, and could represent a lesion of rectal, cervical or uterine origin. 2. No definite findings to suggest metastatic disease in the chest, abdomen or pelvis. 3. Small focus of hypermetabolism in the distal third of the esophagus without a perceivable abnormality on the CT portion of the examination. This could represent physiologic activity, a focal area of esophagitis, or an underlying lesion. Clinical correlation is recommended, with consideration for followup upper endoscopy if clinically appropriate. 4. Nonspecific low-level hypermetabolism in a left axillary  lymph node which is stable in size and appearance compared to prior examinations. This is favored to be benign, but correlation with routine annual mammography is recommended. 5. Dilatation of the pulmonic trunk (4 cm in diameter), concerning for pulmonary arterial hypertension. 6.  Severe hepatic steatosis.    01/05/2017 PET scan    1. Previously noted pelvic mass is similar in size and appearance to the prior examination from 12/23/2016 demonstrating some low-level hypermetabolism. This is nonspecific, and could represent a lesion of rectal, cervical or uterine origin. 2. No definite findings to suggest metastatic disease in the chest, abdomen or pelvis. 3. Small focus of hypermetabolism in the distal third of the esophagus without a perceivable abnormality on the CT portion of the examination. This could represent physiologic activity, a focal area of esophagitis, or an underlying lesion. Clinical correlation is recommended, with consideration for followup upper endoscopy if clinically appropriate. 4. Nonspecific low-level hypermetabolism in a left axillary lymph node which is stable in size and appearance compared to prior examinations. This is favored to be benign, but correlation with routine annual mammography is recommended. 5. Dilatation of the pulmonic trunk (4 cm in diameter), concerning for pulmonary arterial hypertension. 6. Severe hepatic steatosis. 7. Aortic atherosclerosis, in addition to left anterior descending coronary artery disease. Please note that although the presence of coronary artery calcium documents the presence of coronary artery disease, the severity of this disease and any potential stenosis cannot be assessed on this non-gated CT examination. Assessment for potential risk factor modification, dietary therapy or pharmacologic therapy may be warranted, if clinically indicated. 8. Additional incidental findings, as above. Aortic Atherosclerosis (ICD10-I70.0).     01/27/2017 Surgery    She underwent diagnostic laparoscopy, robotic assisted resection of intraperitoneal mass, infracolic omentectomy, peritoneal biopsies and rigid proctoscopy. R1 resection with miliary disease remaining in tumor bed    02/04/2017 Pathology Results    A: Cul-de-sac mass, biopsy  - Endometrioid adenocarcinoma, FIGO grade 1 (architecture grade 1, nuclear grade 2) (see comment)  B: Peritoneum, pelvic, biopsy  - Fragments of fibrovascular tissue with hemorrhage - No malignancy identified  C: Cul-de-sac, biopsy  - Endometrioid adenocarcinoma, FIGO grade 1 (architecture grade 1, nuclear grade 2), with squamous differentiation (see comment)  D: Omentum, omentectomy  - Benign omentum  - No malignancy identified  The purpose of this addendum is to report the results of immunohistochemical stains for mismatch repair proteins and hormone receptors performed on a representative block of tumor.  Immunostains for mismatch repair proteins were performed on block C2 and show intact expression of MLH1, PMS2, MSH2, and MSH6 within the tumor. This is the normal phenotype and does NOT support a diagnosis of microsatellite instability/hereditary non-polyposis colorectal cancer (HNPCC) associated carcinoma. Molecular testing for additional microsatellite instability markers will be performed by the [employer] [phone]) and reported separately.  Estrogen receptor (Ventana, clone SP1):  Positive (90%, weak to moderate)  Progesterone receptor (Ventana, clone 1E2):  Positive (90%, strong)    02/16/2017 Procedure    Successful placement of a right internal jugular approach power injectable Port-A-Cath. The catheter is ready for immediate use.    03/04/2017 - 06/17/2017 Chemotherapy    The patient had Carboplatin and Taxol for chemotherapy treatment.      06/24/2017 -  Anti-estrogen oral therapy    She started letrozole    08/23/2017 Imaging    1. Soft tissue  lesion seen posterior to the vagina on the previous PET-CT is not evident today. 2. Trace free fluid in the pelvis. 3. Similar appearance tiny lymph nodes scattered along both common and external iliac chains. 4. 12 mm low-density lesion in the central spleen appears stable. Attention on follow-up recommended.     Ovarian cancer ([place])   08/01/2013  Initial Diagnosis    Ovarian cancer ([place]) Unstaged.  Incidental pathology finding Adnexa - ovary +/- tube, neoplastic, left with omentum - HIGH GRADE CARCINOMA WITH BOTH CLEAR CELL CARCINOMA AND ENDOMETRIOID CARCINOMA COMPONENTS, 20 CM.[place] Kitchen    2016 Genetic Testing     Results revealed patient has the following mutation(s): NEGATIVE       INTERVAL HISTORY:  She feels well and is compliant with letrozole.  Reports persistent left lower quadrant intermittent discomfort sometimes associated with diarrhea , for 3 pound weight loss over the last month .    Patient Active Problem List   Diagnosis Date Noted  . Deficiency anemia 07/23/2017  . Hot flashes related to aromatase inhibitor therapy 07/23/2017  . Steroid-induced hyperglycemia 05/27/2017  . Pancytopenia, acquired ([place]) 05/06/2017  . Mucositis due to antineoplastic therapy 03/25/2017  . Goals of care, counseling/discussion 02/22/2017  . Chemotherapy-induced neuropathy ([place]) 09/24/2015  . Presence of IVC filter 08/14/2014  . Uterine carcinosarcoma ([place]) 08/14/2014  . Morbid obesity ([place]) 02/19/2014  . Left thyroid nodule 11/08/2013  . Family history of malignant neoplasm of breast   . Ovarian cancer ([place])   . Left ovarian epithelial cancer ([place]) 09/09/2013  . Endometrial cancer ([place]) 03/10/2013  . HTN (hypertension) 02/22/2013    has No Known Allergies.  MEDICAL HISTORY: Past Medical History:  Diagnosis Date  . Anemia   . Cancer ([facility])    uterine carcinosarcoma  . DVT (deep venous thrombosis) ([facility])    right leg 12'14  . Elevated blood uric acid level    tx. Allopurinol  .  Family history of malignant neoplasm of breast   . GERD (gastroesophageal reflux disease)   . History of chemotherapy   . Hx of radiation therapy HDR   x 5 tx  completed 03/09/14  . Hypertension   . Ovarian cancer ([place]) 2015  . Pulmonary emboli ([place])    12'14 denies any sob  . Reflux   . Transfusion history 07-26-13   12'14, 04-14-13(2 units), 06-09-13    SURGICAL HISTORY: Past Surgical History:  Procedure Laterality Date  . ABDOMINAL HYSTERECTOMY N/A 08/01/2013   Procedure: EXPLORATORY LAPAROTOMY/HYSTERECTOMY TOTAL ABDOMINAL;  Surgeon: Janie Morning, MD;  Location: [facility];  Service: Gynecology;  Laterality: N/A;  . IR FLUORO GUIDE PORT INSERTION RIGHT  02/16/2017  . IR US GUIDE VASC ACCESS RIGHT  02/16/2017  . IVC Right    filter placed to prevent Pulmonary emboli  . PORTACATH PLACEMENT Right    rigth chest- remains  . removed tumor from rectum    . SALPINGOOPHORECTOMY Bilateral 08/01/2013   Procedure: BILATERAL SALPINGO OOPHORECTOMY/OMENTECTOMY ;  Surgeon: Janie Morning, MD;  Location: [facility];  Service: Gynecology;  Laterality: Bilateral;    SOCIAL HISTORY: Social History   Socioeconomic History  . Marital status: Married    Spouse name: Not on file  . Number of children: Not on file  . Years of education: Not on file  . Highest education level: Not on file  Occupational History  . Not on file  Social Needs  . Financial resource strain: Not on file  . Food insecurity:    Worry: Not on file    Inability: Not on file  . Transportation needs:    Medical: Not on file    Non-medical: Not on file  Tobacco Use  . Smoking status: Never Smoker  . Smokeless tobacco: Never Used  Substance and Sexual Activity  . Alcohol use: No    Comment: Quit  2 yrs ago-social  in past  . Drug use: No  . Sexual activity: Yes  Lifestyle  . Physical activity:    Days per week: Not on file    Minutes per session: Not on file  . Stress: Not on file  Relationships  . Social connections:     Talks on phone: Not on file    Gets together: Not on file    Attends religious service: Not on file    Active member of club or organization: Not on file    Attends meetings of clubs or organizations: Not on file    Relationship status: Not on file  . Intimate partner violence:    Fear of current or ex partner: Not on file    Emotionally abused: Not on file    Physically abused: Not on file    Forced sexual activity: Not on file  Other Topics Concern  . Not on file  Social History Narrative  . Not on file  The foster children are growing and she has much joy in her family life.  Her church family provides a great deal of emotional and spiritual support.  Mother died in 2023/01/20   FAMILY HISTORY: Family History  Problem Relation Age of Onset  . Diabetes Mother   . Hypertension Mother   . Hypertension Father   . Diabetes Father   . Prostate cancer Father 65       deceased 40  . Breast cancer Maternal Aunt 74       deceased 79  . Prostate cancer Maternal Uncle        5 of 6 mat uncles with prostate cancer; deceased  . Breast cancer Paternal Aunt 4       deceased 50  . Stomach cancer Paternal Grandmother        deceased 4  . Breast cancer Maternal Aunt 24       deceased 3  . Breast cancer Cousin 39       negative BRCAplus, PALB2 2015  . Breast cancer Paternal Aunt 76       deceased 12  . Thyroid disease Neg Hx     Review of Systems  Constitutional: Negative.  Negative for appetite change, chills, diaphoresis, fatigue and fever.  HENT:  Negative.   Eyes: Negative.   Respiratory: Negative.  Negative for cough and shortness of breath.   Cardiovascular: Negative.  Negative for chest pain and leg swelling.       Denies headache chest pain diaphoresis dizziness or other sensory changes  Gastrointestinal: Positive for abdominal pain and diarrhea. Negative for abdominal distention, blood in stool and constipation.       Reports a couple of episodes of pulling-like  comfort in the left lower quadrant  Endocrine: Positive for hot flashes.  Genitourinary: Negative.    Musculoskeletal: Negative.   Skin: Negative.   Neurological: Negative.   Hematological: Negative.   Psychiatric/Behavioral: Negative.       PHYSICAL EXAMINATION  ECOG PERFORMANCE STATUS: 0 - Asymptomatic  There were no vitals filed for this visit. Wt Readings from Last 3 Encounters:  03/31/18 280 lb (127 kg)  12/09/17 283 lb (128.4 kg)  09/09/17 279 lb 3.2 oz (126.6 kg)  BP (!) 158/86 (BP Location: Left Arm, Patient Position: Sitting) Comment: manual bp. notified Dr Skeet Latch.   Pulse 79   Temp 98.5 F (36.9 C) (Oral)   Resp 20   Ht '5\' 7"'$  (1.702 m)   Wt 280 lb (127 kg)   LMP  03/09/2013   BMI 43.85 kg/m  Blood pressure 165/102 189/90 Physical Exam Constitutional:      General: She is not in acute distress.    Appearance: Normal appearance. She is well-developed.  HENT:     Head: Normocephalic and atraumatic.  Eyes:     General: No scleral icterus. Neck:     Musculoskeletal: Normal range of motion and neck supple.     Vascular: No JVD.     Trachea: No tracheal deviation.  Cardiovascular:     Rate and Rhythm: Normal rate and regular rhythm.     Heart sounds: Normal heart sounds.  Pulmonary:     Effort: Pulmonary effort is normal.     Breath sounds: Normal breath sounds.  Abdominal:     General: Bowel sounds are normal. There is no distension.     Palpations: Abdomen is soft. There is no fluid wave or mass.     Tenderness: There is no abdominal tenderness.     Hernia: No hernia is present.  Genitourinary:    Exam position: Supine.     Vagina: Normal. No vaginal discharge, tenderness or bleeding.     Adnexa:        Right: No mass, tenderness or fullness.         Left: No mass, tenderness or fullness.       Rectum: External hemorrhoid present. No mass or tenderness. Normal anal tone.     Comments: Left vaginal apex without nodularity, on rectal examination the  thickening at the prior site of recurrence is unchanged  Skin:    Findings: No burn, laceration or lesion.     Nails: There is no clubbing.   Neurological:     Mental Status: She is alert and oriented to person, place, and time.  Psychiatric:        Mood and Affect: Mood is not anxious or depressed. Affect is not labile or blunt.        Speech: Speech normal.        Behavior: Behavior normal.        Thought Content: Thought content normal.     ASSESSMENT and THERAPY PLAN:  Marian P Covington is a 53 y.o. 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  Endometrioid recurrence was identified in October 2018.  Surgical resection and chemotherapy with Taxol carboplatin was completed March 2019.  Imaging in June 2019 was unremarkable for recurrence.  At this visit there is no evidence of disease  Continue letrozole as prescribed Follow-up in 1 months  Hall reports 1 week of left lower quadrant discomfort since last evaluation is increasing in frequency  CT of the abdomen and pelvis  Follow-up 06/10/2018  Obesity Body mass index is 43.85 kg/m. Cariah P Roma is struggling with weight management  Hypertension Benicar HCT Rx Referral made to primary care physician   This note was electronically signed. Janie Morning, MD 03/30/2018

## 2018-03-31 ENCOUNTER — Inpatient Hospital Stay: Payer: 59

## 2018-03-31 ENCOUNTER — Encounter: Payer: Self-pay | Admitting: Gynecologic Oncology

## 2018-03-31 ENCOUNTER — Other Ambulatory Visit: Payer: Self-pay | Admitting: Gynecologic Oncology

## 2018-03-31 ENCOUNTER — Inpatient Hospital Stay: Payer: 59 | Attending: Gynecologic Oncology | Admitting: Gynecologic Oncology

## 2018-03-31 VITALS — BP 150/90 | HR 79 | Temp 98.5°F | Resp 20 | Ht 67.0 in | Wt 280.0 lb

## 2018-03-31 DIAGNOSIS — Z95828 Presence of other vascular implants and grafts: Secondary | ICD-10-CM

## 2018-03-31 DIAGNOSIS — R1030 Lower abdominal pain, unspecified: Secondary | ICD-10-CM

## 2018-03-31 DIAGNOSIS — Z90722 Acquired absence of ovaries, bilateral: Secondary | ICD-10-CM | POA: Diagnosis not present

## 2018-03-31 DIAGNOSIS — Z79811 Long term (current) use of aromatase inhibitors: Secondary | ICD-10-CM | POA: Diagnosis not present

## 2018-03-31 DIAGNOSIS — Z90710 Acquired absence of both cervix and uterus: Secondary | ICD-10-CM | POA: Diagnosis not present

## 2018-03-31 DIAGNOSIS — C541 Malignant neoplasm of endometrium: Secondary | ICD-10-CM | POA: Diagnosis not present

## 2018-03-31 DIAGNOSIS — Z9221 Personal history of antineoplastic chemotherapy: Secondary | ICD-10-CM | POA: Diagnosis not present

## 2018-03-31 DIAGNOSIS — Z923 Personal history of irradiation: Secondary | ICD-10-CM | POA: Insufficient documentation

## 2018-03-31 DIAGNOSIS — I10 Essential (primary) hypertension: Secondary | ICD-10-CM

## 2018-03-31 DIAGNOSIS — C562 Malignant neoplasm of left ovary: Secondary | ICD-10-CM | POA: Insufficient documentation

## 2018-03-31 LAB — BASIC METABOLIC PANEL
Anion gap: 10 (ref 5–15)
BUN: 7 mg/dL (ref 6–20)
CALCIUM: 9.3 mg/dL (ref 8.9–10.3)
CO2: 28 mmol/L (ref 22–32)
Chloride: 104 mmol/L (ref 98–111)
Creatinine, Ser: 1.04 mg/dL — ABNORMAL HIGH (ref 0.44–1.00)
GFR calc Af Amer: >60 mL/min (ref >60)
GFR calc non Af Amer: >60 mL/min (ref >60)
Glucose, Bld: 108 mg/dL — ABNORMAL HIGH (ref 70–99)
Potassium: 3.5 mmol/L (ref 3.5–5.1)
SODIUM: 142 mmol/L (ref 135–145)

## 2018-03-31 MED ORDER — ALTEPLASE 2 MG IJ SOLR
2.0000 mg | Freq: Once | INTRAMUSCULAR | Status: DC | PRN
  Filled 2018-03-31: qty 2

## 2018-03-31 MED ORDER — OLMESARTAN MEDOXOMIL-HCTZ 20-12.5 MG PO TABS: 1.0000 | ORAL_TABLET | Freq: Every day | ORAL | 1 refills | Status: DC

## 2018-03-31 MED ORDER — SODIUM CHLORIDE 0.9% FLUSH
10.0000 mL | Freq: Once | INTRAVENOUS | Status: AC
  Administered 2018-03-31: 10 mL
  Filled 2018-03-31: qty 10

## 2018-03-31 MED ORDER — HEPARIN SOD (PORK) LOCK FLUSH 100 UNIT/ML IV SOLN
500.0000 [IU] | Freq: Once | INTRAVENOUS | Status: AC
  Administered 2018-03-31: 500 [IU]
  Filled 2018-03-31: qty 5

## 2018-03-31 MED ORDER — LIDOCAINE-PRILOCAINE 2.5-2.5 % EX CREA: 1.0000 "application " | TOPICAL_CREAM | CUTANEOUS | 1 refills | Status: DC | PRN

## 2018-03-31 MED ORDER — SODIUM CHLORIDE 0.9% FLUSH
10.0000 mL | INTRAVENOUS | Status: DC | PRN
  Filled 2018-03-31: qty 10

## 2018-03-31 NOTE — Patient Instructions (Signed)
Plan to have a CT scan of the abdomen and pelvis.  We will contact you with the results.  Plan to follow up with Dr. Skeet Latch on May 05, 2018 or sooner if needed.

## 2018-04-04 ENCOUNTER — Ambulatory Visit (HOSPITAL_COMMUNITY)
Admission: RE | Admit: 2018-04-04 | Discharge: 2018-04-04 | Disposition: A | Discharge status: Home / Self Care | Payer: Medicare PPO | Source: Ambulatory Visit | Attending: Gynecologic Oncology | Admitting: Gynecologic Oncology

## 2018-04-04 DIAGNOSIS — C541 Malignant neoplasm of endometrium: Secondary | ICD-10-CM | POA: Diagnosis not present

## 2018-04-04 DIAGNOSIS — C569 Malignant neoplasm of unspecified ovary: Secondary | ICD-10-CM | POA: Diagnosis not present

## 2018-04-04 DIAGNOSIS — R1030 Lower abdominal pain, unspecified: Secondary | ICD-10-CM | POA: Diagnosis not present

## 2018-04-04 MED ORDER — HEPARIN SOD (PORK) LOCK FLUSH 100 UNIT/ML IV SOLN
INTRAVENOUS | Status: AC
  Administered 2018-04-04: 500 [IU] via INTRAVENOUS
  Filled 2018-04-04: qty 5

## 2018-04-04 MED ORDER — IOHEXOL 300 MG/ML  SOLN
100.0000 mL | Freq: Once | INTRAMUSCULAR | Status: AC | PRN
  Administered 2018-04-04: 100 mL via INTRAVENOUS

## 2018-04-04 MED ORDER — SODIUM CHLORIDE (PF) 0.9 % IJ SOLN
INTRAMUSCULAR | Status: AC
  Filled 2018-04-04: qty 50

## 2018-04-04 MED ORDER — HEPARIN SOD (PORK) LOCK FLUSH 100 UNIT/ML IV SOLN: 500.0000 [IU] | Freq: Once | INTRAVENOUS | Status: DC

## 2018-04-05 ENCOUNTER — Telehealth: Payer: Self-pay

## 2018-04-05 NOTE — Telephone Encounter (Signed)
Told Ms Tallent that the CT scan showed no evidence of cancer per Joylene John, NP.

## 2018-04-13 ENCOUNTER — Telehealth: Payer: Self-pay | Admitting: *Deleted

## 2018-04-13 ENCOUNTER — Other Ambulatory Visit: Payer: Self-pay | Admitting: Gynecologic Oncology

## 2018-04-13 DIAGNOSIS — I10 Essential (primary) hypertension: Secondary | ICD-10-CM

## 2018-04-13 MED ORDER — OLMESARTAN MEDOXOMIL-HCTZ 20-12.5 MG PO TABS: 1.0000 | ORAL_TABLET | Freq: Every day | ORAL | 1 refills | Status: DC

## 2018-04-13 NOTE — Telephone Encounter (Signed)
Called and let the patient know that [facility] will call in another script with a refill for her BP medicine. Also let her know that the script from January had a refill with it.

## 2018-04-13 NOTE — Telephone Encounter (Signed)
Dora from [facility] with Dr. Lina Sar office called they are unable to reach the patient. I have reached out to the patient and gave her Dora's number [phone] to call back.

## 2018-04-23 ENCOUNTER — Other Ambulatory Visit: Payer: Self-pay | Admitting: Gynecologic Oncology

## 2018-04-23 DIAGNOSIS — I10 Essential (primary) hypertension: Secondary | ICD-10-CM

## 2018-05-04 NOTE — Progress Notes (Signed)
GYN ONCOLOGY OFFICE VISIT    Seward Carol, MD [address] [place] [address]   CHIEF COMPLAINT:  uterine carcinosarcoma, ovarian clear cell cancer; left lower quadrant pain  : Cancer Staging Endometrial cancer ([place]) Staging form: Corpus Uteri - Carcinoma, AJCC 7th Edition - Clinical: Stage IVB (T3b, NX, M1, Free text: high grade carcinosarcoma M1) - Signed by Gordy Levan, MD on 05/03/2013 - Pathologic: Stage IVB (T3b, NX, M1) - Signed by Gordy Levan, MD on 05/03/2013  Left ovarian epithelial cancer ([place]) Staging form: Ovary, AJCC 7th Edition - Clinical: Stage IV (T1c, NX, M1) - Signed by Gordy Levan, MD on 09/09/2013 - Pathologic: No stage assigned - Unsigned   SUMMARY OF ONCOLOGIC HISTORY: Oncology History   Recurrent endometrial cancer, strong ER positive 90%, PR 90%, MSI stable Clear cell mixed features of ovaries in 2015 Recurrent disease 2018: low grade endometroid       Endometrial cancer ([place])   02/24/2013 Miscellaneous    Pulmonary embolus  IVC filter was placed and she was started on Lovenox.  She was subsequently transitioned to warfarin.     03/07/2013 Pathology Results    1. Cervix, biopsy - HIGH GRADE ENDOMETRIAL CARCINOMA, SEE COMMENT. 2. Endometrium, biopsy - HIGH GRADE ENDOMETRIAL CARCINOMA, SEE COMMENT. Microscopic Comment 1. , 2. In both parts 1 and 2, slide sections demonstrate extensive involvement by high grade endometrial carcinoma demonstrating a biphasic epithelial and stromal architecture. The immunophenotype (strong diffuse estrogen receptor; strong diffuse vimentin expression; patchy monoclonal CEA expression, patchy p16 immunostain expression, and diffuse mild to moderate p53 expression) is consistent with primary endometrial origin. 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      03/14/2013 - 01/04/2014 Chemotherapy    taxol carboplatin x 9    08/01/2013 Surgery    Procedure(s) Performed: 1. Exploratory laparotomy with total hysterectomy bilateral salpingo-oophorectomy, omental biopsies.  Surgeon: Francetta Found.  Skeet Latch, MD., PhD  Assistant Surgeon: Lahoma Crocker M.D.   Specimens: Uterus Cervix, Bilateral tubes / ovaries, lower  omentum.   Operative Findings: A 24cm left ovarian mass hemosiderine filled with omental adhesions.  Evidence of chemotherapeutic changes over the bowel.  Uterus 14 cm.  No palpable hepatic mets but the examination was limited by diaphragmatic adhesions.  No gross disease in the omentum. TAH BSO omentectomy Left ovary 20cm clear cell malignancy  Uterus Carcinosarcoma  Multiple microscopic foci, measuring up to 0.5 cm in greatest dimension. Histologic type: Carcinosarcoma (MMMT) with heterologous component (ossification) Grade: High grade Myometrial invasion: 2.1 cm where myometrium is 2.5 cm in thickness Cervical stromal involvement: No.    08/01/2013 Pathology Results    1. Adnexa - ovary +/- tube, neoplastic, left with omentum - HIGH GRADE CARCINOMA WITH BOTH CLEAR CELL CARCINOMA AND ENDOMETRIOID CARCINOMA COMPONENTS, 20 CM. PLEASE SEE ONCOLOGY TEMPLATE FOR DETAIL. 2. Uterus and cervix, with right fallopian tube and right ovary - MICROSCOPIC FOCI OF RESIDUAL CARCINOSARCOMA WITH HETEROLOGOUS ELEMENT, INVOLVING OUTER HALF OF THE MYOMETRIUM. - EXTENSIVE ADENOMYOSIS AND LEIOMYOMA. - CERVIX: BENIGN SQUAMOUS MUCOSA AND ENDOCERVICAL MUCOSA, NO DYSPLASIA OR MALIGNANCY. - RIGHT OVARY AND FALLOPIAN TUBES: NO PATHOLOGIC ABNORMALITIES. Microscopic Comment  1. OVARY Specimen(s): Left ovary and fallopian tube. Procedure: (including lymph node sampling): Left salpingo-oophorectomy Primary tumor site (including laterality): Left ovary Ovarian surface involvement: Present. Ovarian capsule intact without fragmentation: No. Maximum  tumor size (cm): 20  cm, gross measurement. Histologic type: Mixed surface epithelial carcinoma with predominant clear cell carcinoma component (70%) and minor endometrioid component (30%) Please see comment for detail. Grade: High grade Peritoneal implants: (specify invasive or non-invasive): N/A Pelvic extension (list additional structures on separate lines and if involved): No. Lymph nodes: number examined - N/A ; number positive - N/A TNM code: ypT1c2, pNX FIGO Stage (based on pathologic findings, needs clinical correlation): at least IC2 Comments: Received grossly is a 20 cm focally disrupted and partially collapsed multicystic ovary with attached/adherent omentum. Microscopically, sectioned show an invasive high grade surface carcinoma arising in a background of endometriotic cyst. The tumor is predominately composed of clear cell carcinoma component with minor endometrioid carcinoma component. No tumor involvement is identified in the adherent omentum tissue. Immunohistochemical stains were performed and the clear cell carcinoma is patchy positive for p16, ER, p53, negative for WT-1 and p63. Focal angiolymphatic invasion is present  The morphologic features are different from those seen in the uterus. 2. ONCOLOGY TABLE-UTERUS, CARCINOSARCOMA Specimen: Uterus, cervix and right fallopian tubes and ovaries. Procedure: Total hysterectomy and right salpingo-oophorectomy. Lymph node sampling performed: No. Specimen integrity: Intact. Maximum tumor size: Multiple microscopic foci, measuring up to 0.5 cm in greatest dimension. Histologic type: Carcinosarcoma (MMMT) with heterologous component (ossification) Grade: High grade Myometrial invasion: 2.1 cm where myometrium is 2.5 cm in thickness Cervical stromal involvement: No. Extent of involvement of other organs: No. Lymph - vascular invasion: Not identified. Peritoneal washings: N/A Lymph nodes: # examined N/A ; # positive N/A Pelvic lymph  nodes: N/A involved of N/A lymph nodes. Para-aortic lymph nodes: N/A involved of N/A lymph nodes. Other (specify involvement and site): N/A TNM code: ypT1b, ypNX FIGO Stage (based on pathologic findings, needs clinical correlation): IB Comment: Immunohistochemical stains were performed and the residual carcinoma is positive for p16, negative for WT-1, weakly positive for p53 and ER. The morphologic features are different from those seen in the left ovary. (HCL:gt, 08/04/13)    02/21/2014 - 03/09/2014 Radiation Therapy    Received vaginal brachytherapy TREATMENT DATES: 12/2, 12/7, 12/11, 12/14 and 03/09/14  SITE/DOSE: Vaginal Cuff 4 cm treatment length / 30 Gy in 5 fractions  Technique: HDR with Ir-192     12/17/2016 Relapse/Recurrence    2cm rubbery like mass in the RV septum on physical examination    12/23/2016 Imaging    Reproductive: Previous hysterectomy. New soft tissue mass seen between the vaginal cuff and anterior rectal wall which measures 2.7 x 2.6 cm on image 112/ 2. This is consistent with locally recurrent endometrial carcinoma.    01/05/2017 Imaging    1. Previously noted pelvic mass is similar in size and appearance to the prior examination from 12/23/2016 demonstrating some low-level hypermetabolism. This is nonspecific, and could represent a lesion of rectal, cervical or uterine origin. 2. No definite findings to suggest metastatic disease in the chest, abdomen or pelvis. 3. Small focus of hypermetabolism in the distal third of the esophagus without a perceivable abnormality on the CT portion of the examination. This could represent physiologic activity, a focal area of esophagitis, or an underlying lesion. Clinical correlation is recommended, with consideration for followup upper endoscopy if clinically appropriate. 4. Nonspecific low-level hypermetabolism in a left axillary lymph node which is  stable in size and appearance compared to prior examinations. This is favored to be benign, but correlation with routine annual mammography is recommended. 5. Dilatation of the pulmonic trunk (4 cm in diameter), concerning for pulmonary arterial hypertension. 6.  Severe hepatic steatosis.    01/05/2017 PET scan    1. Previously noted pelvic mass is similar in size and appearance to the prior examination from 12/23/2016 demonstrating some low-level hypermetabolism. This is nonspecific, and could represent a lesion of rectal, cervical or uterine origin. 2. No definite findings to suggest metastatic disease in the chest, abdomen or pelvis. 3. Small focus of hypermetabolism in the distal third of the esophagus without a perceivable abnormality on the CT portion of the examination. This could represent physiologic activity, a focal area of esophagitis, or an underlying lesion. Clinical correlation is recommended, with consideration for followup upper endoscopy if clinically appropriate. 4. Nonspecific low-level hypermetabolism in a left axillary lymph node which is stable in size and appearance compared to prior examinations. This is favored to be benign, but correlation with routine annual mammography is recommended. 5. Dilatation of the pulmonic trunk (4 cm in diameter), concerning for pulmonary arterial hypertension. 6. Severe hepatic steatosis. 7. Aortic atherosclerosis, in addition to left anterior descending coronary artery disease. Please note that although the presence of coronary artery calcium documents the presence of coronary artery disease, the severity of this disease and any potential stenosis cannot be assessed on this non-gated CT examination. Assessment for potential risk factor modification, dietary therapy or pharmacologic therapy may be warranted, if clinically indicated. 8. Additional incidental findings, as above. Aortic Atherosclerosis (ICD10-I70.0).    01/27/2017 Surgery     She underwent diagnostic laparoscopy, robotic assisted resection of intraperitoneal mass, infracolic omentectomy, peritoneal biopsies and rigid proctoscopy. R1 resection with miliary disease remaining in tumor bed    02/04/2017 Pathology Results    A: Cul-de-sac mass, biopsy  - Endometrioid adenocarcinoma, FIGO grade 1 (architecture grade 1, nuclear grade 2) (see comment)  B: Peritoneum, pelvic, biopsy  - Fragments of fibrovascular tissue with hemorrhage - No malignancy identified  C: Cul-de-sac, biopsy  - Endometrioid adenocarcinoma, FIGO grade 1 (architecture grade 1, nuclear grade 2), with squamous differentiation (see comment)  D: Omentum, omentectomy  - Benign omentum  - No malignancy identified  The purpose of this addendum is to report the results of immunohistochemical stains for mismatch repair proteins and hormone receptors performed on a representative block of tumor.  Immunostains for mismatch repair proteins were performed on block C2 and show intact expression of MLH1, PMS2, MSH2, and MSH6 within the tumor. This is the normal phenotype and does NOT support a diagnosis of microsatellite instability/hereditary non-polyposis colorectal cancer (HNPCC) associated carcinoma. Molecular testing for additional microsatellite instability markers will be performed by the [employer] [phone]) and reported separately.  Estrogen receptor (Ventana, clone SP1):  Positive (90%, weak to moderate)  Progesterone receptor (Ventana, clone 1E2):  Positive (90%, strong)    02/16/2017 Procedure    Successful placement of a right internal jugular approach power injectable Port-A-Cath. The catheter is ready for immediate use.    03/04/2017 - 06/17/2017 Chemotherapy    The patient had Carboplatin and Taxol for chemotherapy treatment.      06/24/2017 -  Anti-estrogen oral therapy    She started letrozole    08/23/2017 Imaging    1. Soft tissue lesion seen posterior to  the vagina on the previous PET-CT is not evident today. 2. Trace free fluid in the pelvis. 3. Similar appearance tiny lymph nodes scattered along both common and external iliac chains. 4. 12 mm low-density lesion in the central spleen appears stable. Attention on follow-up recommended.    03/2018 Imaging    CT Abd/Pelvis  Other: Small right inguinal hernia contains fat. No free fluid. Soft tissue extending between the vaginal cuff and rectum (series 2, image 80) may be minimally more prominent, currently measuring 10 mm.  Musculoskeletal: Degenerative changes in the spine. No worrisome lytic or sclerotic lesions.  IMPRESSION: 1. Soft tissue extending between the vaginal cuff and rectum may be minimally thicker. Continued attention on follow-up exams is warranted. 2. Subtle low-density lesion in the spleen is unchanged. 3.  Aortic atherosclerosis (ICD10-170.0).       Ovarian cancer ([place])   08/01/2013 Initial Diagnosis    Ovarian cancer ([place]) Unstaged.  Incidental pathology finding Adnexa - ovary +/- tube, neoplastic, left with omentum - HIGH GRADE CARCINOMA WITH BOTH CLEAR CELL CARCINOMA AND ENDOMETRIOID CARCINOMA COMPONENTS, 20 CM.Marland Kitchen    2016 Genetic Testing     Results revealed patient has the following mutation(s): NEGATIVE       INTERVAL HISTORY:  She feels well and is compliant with letrozole.  Reports decrease in frequency of  left lower quadrant intermittent discomfort.  Diarrhea has resolved with dietary modifications.     Patient Active Problem List   Diagnosis Date Noted  . Deficiency anemia 07/23/2017  . Hot flashes related to aromatase inhibitor therapy 07/23/2017  . Steroid-induced hyperglycemia 05/27/2017  . Pancytopenia, acquired ([place]) 05/06/2017  . Mucositis due to antineoplastic therapy 03/25/2017  . Goals of care, counseling/discussion 02/22/2017  . Chemotherapy-induced neuropathy ([place]) 09/24/2015  . Presence of IVC filter 08/14/2014  . Uterine  carcinosarcoma ([place]) 08/14/2014  . Morbid obesity ([place]) 02/19/2014  . Left thyroid nodule 11/08/2013  . Family history of malignant neoplasm of breast   . Ovarian cancer ([place])   . Left ovarian epithelial cancer ([place]) 09/09/2013  . Endometrial cancer ([place]) 03/10/2013  . HTN (hypertension) 02/22/2013    has No Known Allergies.  MEDICAL HISTORY: Past Medical History:  Diagnosis Date  . Anemia   . Cancer ([facility])    uterine carcinosarcoma  . DVT (deep venous thrombosis) ([facility])    right leg 12'14  . Elevated blood uric acid level    tx. Allopurinol  . Family history of malignant neoplasm of breast   . GERD (gastroesophageal reflux disease)   . History of chemotherapy   . Hx of radiation therapy HDR   x 5 tx  completed 03/09/14  . Hypertension   . Ovarian cancer ([place]) 2015  . Pulmonary emboli ([place])    12'14 denies any sob  . Reflux   . Transfusion history 07-26-13   12'14, 04-14-13(2 units), 06-09-13    SURGICAL HISTORY: Past Surgical History:  Procedure Laterality Date  . ABDOMINAL HYSTERECTOMY N/A 08/01/2013   Procedure: EXPLORATORY LAPAROTOMY/HYSTERECTOMY TOTAL ABDOMINAL;  Surgeon: Janie Morning, MD;  Location: [facility];  Service: Gynecology;  Laterality: N/A;  . IR FLUORO GUIDE PORT INSERTION RIGHT  02/16/2017  . IR US GUIDE VASC ACCESS RIGHT  02/16/2017  . IVC Right    filter placed to prevent Pulmonary emboli  . PORTACATH PLACEMENT Right    rigth chest- remains  . removed tumor from rectum    . SALPINGOOPHORECTOMY Bilateral 08/01/2013   Procedure: BILATERAL SALPINGO OOPHORECTOMY/OMENTECTOMY ;  Surgeon: Janie Morning, MD;  Location: [facility];  Service: Gynecology;  Laterality: Bilateral;    SOCIAL HISTORY: Social History   Socioeconomic History  . Marital status: Married    Spouse name: Not on file  . Number of children: Not on file  . Years of education: Not on file  . Highest education level: Not  on file  Occupational History  . Not on file  Social Needs  .  Financial resource strain: Not on file  . Food insecurity:    Worry: Not on file    Inability: Not on file  . Transportation needs:    Medical: Not on file    Non-medical: Not on file  Tobacco Use  . Smoking status: Never Smoker  . Smokeless tobacco: Never Used  Substance and Sexual Activity  . Alcohol use: No    Comment: Quit  2 yrs ago-social in past  . Drug use: No  . Sexual activity: Yes  Lifestyle  . Physical activity:    Days per week: Not on file    Minutes per session: Not on file  . Stress: Not on file  Relationships  . Social connections:    Talks on phone: Not on file    Gets together: Not on file    Attends religious service: Not on file    Active member of club or organization: Not on file    Attends meetings of clubs or organizations: Not on file    Relationship status: Not on file  . Intimate partner violence:    Fear of current or ex partner: Not on file    Emotionally abused: Not on file    Physically abused: Not on file    Forced sexual activity: Not on file  Other Topics Concern  . Not on file  Social History Narrative  . Not on file  The foster children are growing and she has much joy in her family life.  Her church family provides a great deal of emotional and spiritual support.  Mother died in 01/11/23   FAMILY HISTORY: Family History  Problem Relation Age of Onset  . Diabetes Mother   . Hypertension Mother   . Hypertension Father   . Diabetes Father   . Prostate cancer Father 36       deceased 2  . Breast cancer Maternal Aunt 74       deceased 5  . Prostate cancer Maternal Uncle        5 of 6 mat uncles with prostate cancer; deceased  . Breast cancer Paternal Aunt 9       deceased 63  . Stomach cancer Paternal Grandmother        deceased 64  . Breast cancer Maternal Aunt 14       deceased 43  . Breast cancer Cousin 52       negative BRCAplus, PALB2 2015  . Breast cancer Paternal Aunt 69       deceased 3  . Thyroid disease Neg  Hx     Review of Systems  Constitutional: Negative.  Negative for appetite change, chills, diaphoresis, fatigue and fever.  HENT:  Negative.   Eyes: Negative.   Respiratory: Negative.  Negative for cough and shortness of breath.   Cardiovascular: Negative.  Negative for chest pain and leg swelling.       Denies headache chest pain diaphoresis dizziness or other sensory changes  Gastrointestinal: Positive for abdominal pain and diarrhea. Negative for abdominal distention, blood in stool and constipation.       Reports a couple of episodes of pulling-like comfort in the left lower quadrant  Endocrine: Positive for hot flashes.  Genitourinary: Negative.    Musculoskeletal: Negative.   Skin: Negative.   Neurological: Negative.   Hematological: Negative.   Psychiatric/Behavioral: Negative.       PHYSICAL EXAMINATION  ECOG PERFORMANCE STATUS: 0 - Asymptomatic  There were no vitals filed for this visit. Wt Readings from Last 3 Encounters:  03/31/18 280 lb (127 kg)  12/09/17 283 lb (128.4 kg)  09/09/17 279 lb 3.2 oz (126.6 kg)  LMP 03/09/2013  Blood pressure 165/102 189/90 Physical Exam Exam conducted with a chaperone present.  Constitutional:      General: She is not in acute distress.    Appearance: Normal appearance. She is well-developed.  HENT:     Head: Normocephalic and atraumatic.  Eyes:     General: No scleral icterus. Neck:     Musculoskeletal: Normal range of motion and neck supple.     Vascular: No JVD.     Trachea: No tracheal deviation.  Pulmonary:     Effort: Pulmonary effort is normal.  Abdominal:     General: There is no distension.     Palpations: Abdomen is soft. There is no fluid wave or mass.     Tenderness: There is no abdominal tenderness. There is no guarding or rebound.     Hernia: No hernia is present.  Genitourinary:    Exam position: Supine.     Vagina: No tenderness or bleeding.     Adnexa:        Right: No mass, tenderness or fullness.          Left: No mass, tenderness or fullness.       Rectum: External hemorrhoid present. No mass or tenderness. Normal anal tone.  Skin:    Findings: No burn, laceration or lesion.     Nails: There is no clubbing.   Neurological:     Mental Status: She is alert and oriented to person, place, and time.  Psychiatric:        Mood and Affect: Mood is not anxious or depressed. Affect is not labile or blunt.        Speech: Speech normal.        Behavior: Behavior normal.        Thought Content: Thought content normal.     ASSESSMENT and THERAPY PLAN:  Caitlyn Higgins is a 53 y.o. 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  Endometrioid recurrence was identified in October 2018.  Surgical resection and chemotherapy with Taxol carboplatin was completed March 2019. Caitlyn Higgins reports intermittent  left lower quadrant discomfort. CT of the abdomen and pelvis  06/10/2018 notable for fat containing inguinal hernia, stable cuff, splenic and adenopathy changes  Continue letrozole as prescribed Follow-up in 3 months CA 125 today and repeat in three months Repeat imaging of the pelvis and abdomen in 3 months   Obesity There is no height or weight on file to calculate BMI. Caitlyn Higgins is struggling with weight management.  Restarted exercising yesterday.

## 2018-05-05 ENCOUNTER — Inpatient Hospital Stay: Payer: Medicare PPO | Admitting: Gynecologic Oncology

## 2018-05-05 ENCOUNTER — Encounter: Payer: Self-pay | Admitting: Gynecologic Oncology

## 2018-05-05 ENCOUNTER — Inpatient Hospital Stay: Payer: Medicare PPO | Attending: Gynecologic Oncology

## 2018-05-05 VITALS — BP 134/86 | HR 78 | Temp 98.3°F | Resp 16 | Ht 67.0 in | Wt 280.0 lb

## 2018-05-05 DIAGNOSIS — R1030 Lower abdominal pain, unspecified: Secondary | ICD-10-CM | POA: Insufficient documentation

## 2018-05-05 DIAGNOSIS — Z90722 Acquired absence of ovaries, bilateral: Secondary | ICD-10-CM | POA: Diagnosis not present

## 2018-05-05 DIAGNOSIS — Z9221 Personal history of antineoplastic chemotherapy: Secondary | ICD-10-CM

## 2018-05-05 DIAGNOSIS — C541 Malignant neoplasm of endometrium: Secondary | ICD-10-CM | POA: Insufficient documentation

## 2018-05-05 DIAGNOSIS — Z79811 Long term (current) use of aromatase inhibitors: Secondary | ICD-10-CM | POA: Insufficient documentation

## 2018-05-05 DIAGNOSIS — Z90710 Acquired absence of both cervix and uterus: Secondary | ICD-10-CM | POA: Diagnosis not present

## 2018-05-05 DIAGNOSIS — C562 Malignant neoplasm of left ovary: Secondary | ICD-10-CM

## 2018-05-05 NOTE — Patient Instructions (Signed)
   Thank you very much [name] for allowing me to provide care for you today.  I appreciate your confidence in choosing our Gynecologic Oncology team.  If you have any questions about your visit today please call our office and we will get back to you as soon as possible.  Please consider using the website Medlineplus.gov as an Geneticist, molecular.   Francetta Found. Gustavia Carie MD., PhD Gynecologic Oncology  Follow-up in 3 months CA 125 every 3 months  CT A/P in 3 months

## 2018-05-06 ENCOUNTER — Telehealth: Payer: Self-pay

## 2018-05-06 DIAGNOSIS — H04123 Dry eye syndrome of bilateral lacrimal glands: Secondary | ICD-10-CM | POA: Diagnosis not present

## 2018-05-06 DIAGNOSIS — H2513 Age-related nuclear cataract, bilateral: Secondary | ICD-10-CM | POA: Diagnosis not present

## 2018-05-06 DIAGNOSIS — H40053 Ocular hypertension, bilateral: Secondary | ICD-10-CM | POA: Diagnosis not present

## 2018-05-06 LAB — CA 125: Cancer Antigen (CA) 125: 8.3 U/mL (ref 0.0–38.1)

## 2018-05-06 NOTE — Telephone Encounter (Signed)
Outgoing call to patient with results of CA 125 per Joylene John NP- "within normal limits"  - Pt voiced understanding. No other needs per pt at this time.

## 2018-05-08 ENCOUNTER — Other Ambulatory Visit: Payer: Self-pay | Admitting: Hematology and Oncology

## 2018-05-26 ENCOUNTER — Inpatient Hospital Stay: Payer: Medicare PPO | Attending: Gynecologic Oncology

## 2018-05-26 DIAGNOSIS — Z452 Encounter for adjustment and management of vascular access device: Secondary | ICD-10-CM | POA: Diagnosis not present

## 2018-05-26 DIAGNOSIS — C542 Malignant neoplasm of myometrium: Secondary | ICD-10-CM | POA: Diagnosis not present

## 2018-05-26 DIAGNOSIS — C787 Secondary malignant neoplasm of liver and intrahepatic bile duct: Secondary | ICD-10-CM | POA: Diagnosis not present

## 2018-05-26 DIAGNOSIS — C541 Malignant neoplasm of endometrium: Secondary | ICD-10-CM

## 2018-05-26 DIAGNOSIS — C562 Malignant neoplasm of left ovary: Secondary | ICD-10-CM | POA: Diagnosis present

## 2018-05-26 MED ORDER — SODIUM CHLORIDE 0.9% FLUSH
10.0000 mL | Freq: Once | INTRAVENOUS | Status: AC
  Administered 2018-05-26: 10 mL
  Filled 2018-05-26: qty 10

## 2018-05-26 MED ORDER — HEPARIN SOD (PORK) LOCK FLUSH 100 UNIT/ML IV SOLN
500.0000 [IU] | Freq: Once | INTRAVENOUS | Status: AC
  Administered 2018-05-26: 500 [IU]
  Filled 2018-05-26: qty 5

## 2018-06-21 ENCOUNTER — Telehealth: Payer: Self-pay

## 2018-06-21 ENCOUNTER — Other Ambulatory Visit: Payer: Self-pay | Admitting: Gynecologic Oncology

## 2018-06-21 DIAGNOSIS — I10 Essential (primary) hypertension: Secondary | ICD-10-CM

## 2018-06-21 MED ORDER — OLMESARTAN MEDOXOMIL-HCTZ 20-12.5 MG PO TABS: 1.0000 | ORAL_TABLET | Freq: Every day | ORAL | 1 refills | Status: DC

## 2018-06-21 NOTE — Telephone Encounter (Signed)
  Spoke with pharmacist and stated that the prescription was given to patient by Joylene John, NP to get her to PCP Dr. Seward Carol. Refill request needs to go th her PCP. Pharmacist will send a refill request to that office.

## 2018-06-21 NOTE — Progress Notes (Signed)
Pt's PCP appt got moved out to May due to [place] pandemic.  Refill given to last to appt.  Advised refills would need to come from PCP.

## 2018-07-19 ENCOUNTER — Other Ambulatory Visit: Payer: Self-pay | Admitting: Gynecologic Oncology

## 2018-07-19 DIAGNOSIS — I10 Essential (primary) hypertension: Secondary | ICD-10-CM

## 2018-07-27 ENCOUNTER — Other Ambulatory Visit: Payer: Self-pay

## 2018-07-27 ENCOUNTER — Ambulatory Visit (HOSPITAL_COMMUNITY)
Admission: RE | Admit: 2018-07-27 | Discharge: 2018-07-27 | Disposition: A | Discharge status: Home / Self Care | Payer: Medicare PPO | Source: Ambulatory Visit | Attending: Gynecologic Oncology | Admitting: Gynecologic Oncology

## 2018-07-27 ENCOUNTER — Inpatient Hospital Stay: Payer: Medicare PPO

## 2018-07-27 ENCOUNTER — Inpatient Hospital Stay: Payer: Medicare PPO | Attending: Gynecologic Oncology

## 2018-07-27 ENCOUNTER — Encounter (HOSPITAL_COMMUNITY): Payer: Self-pay

## 2018-07-27 DIAGNOSIS — Z923 Personal history of irradiation: Secondary | ICD-10-CM | POA: Insufficient documentation

## 2018-07-27 DIAGNOSIS — R1030 Lower abdominal pain, unspecified: Secondary | ICD-10-CM | POA: Insufficient documentation

## 2018-07-27 DIAGNOSIS — Z7901 Long term (current) use of anticoagulants: Secondary | ICD-10-CM | POA: Insufficient documentation

## 2018-07-27 DIAGNOSIS — Z90722 Acquired absence of ovaries, bilateral: Secondary | ICD-10-CM | POA: Diagnosis not present

## 2018-07-27 DIAGNOSIS — C541 Malignant neoplasm of endometrium: Secondary | ICD-10-CM | POA: Insufficient documentation

## 2018-07-27 DIAGNOSIS — Z86711 Personal history of pulmonary embolism: Secondary | ICD-10-CM | POA: Insufficient documentation

## 2018-07-27 DIAGNOSIS — Z79811 Long term (current) use of aromatase inhibitors: Secondary | ICD-10-CM | POA: Diagnosis not present

## 2018-07-27 DIAGNOSIS — Z170 Estrogen receptor positive status [ER+]: Secondary | ICD-10-CM | POA: Insufficient documentation

## 2018-07-27 DIAGNOSIS — R109 Unspecified abdominal pain: Secondary | ICD-10-CM | POA: Diagnosis not present

## 2018-07-27 DIAGNOSIS — Z90710 Acquired absence of both cervix and uterus: Secondary | ICD-10-CM | POA: Insufficient documentation

## 2018-07-27 DIAGNOSIS — C562 Malignant neoplasm of left ovary: Secondary | ICD-10-CM | POA: Diagnosis not present

## 2018-07-27 DIAGNOSIS — C786 Secondary malignant neoplasm of retroperitoneum and peritoneum: Secondary | ICD-10-CM | POA: Insufficient documentation

## 2018-07-27 LAB — BASIC METABOLIC PANEL
Anion gap: 11 (ref 5–15)
BUN: 18 mg/dL (ref 6–20)
CO2: 24 mmol/L (ref 22–32)
Calcium: 8 mg/dL — ABNORMAL LOW (ref 8.9–10.3)
Chloride: 105 mmol/L (ref 98–111)
Creatinine, Ser: 1.32 mg/dL — ABNORMAL HIGH (ref 0.44–1.00)
GFR calc Af Amer: 53 mL/min — ABNORMAL LOW (ref >60)
GFR calc non Af Amer: 46 mL/min — ABNORMAL LOW (ref >60)
Glucose, Bld: 114 mg/dL — ABNORMAL HIGH (ref 70–99)
Potassium: 3.8 mmol/L (ref 3.5–5.1)
Sodium: 140 mmol/L (ref 135–145)

## 2018-07-27 MED ORDER — HEPARIN SOD (PORK) LOCK FLUSH 100 UNIT/ML IV SOLN
500.0000 [IU] | Freq: Once | INTRAVENOUS | Status: AC
  Administered 2018-07-27: 13:00:00 500 [IU] via INTRAVENOUS

## 2018-07-27 MED ORDER — IOHEXOL 300 MG/ML  SOLN
100.0000 mL | Freq: Once | INTRAMUSCULAR | Status: AC | PRN
  Administered 2018-07-27: 100 mL via INTRAVENOUS

## 2018-07-27 MED ORDER — SODIUM CHLORIDE (PF) 0.9 % IJ SOLN
INTRAMUSCULAR | Status: AC
  Filled 2018-07-27: qty 50

## 2018-07-27 MED ORDER — SODIUM CHLORIDE 0.9% FLUSH
10.0000 mL | Freq: Once | INTRAVENOUS | Status: AC
  Administered 2018-07-27: 10 mL
  Filled 2018-07-27: qty 10

## 2018-07-27 MED ORDER — HEPARIN SOD (PORK) LOCK FLUSH 100 UNIT/ML IV SOLN
INTRAVENOUS | Status: AC
  Filled 2018-07-27: qty 5

## 2018-07-28 ENCOUNTER — Telehealth: Payer: Self-pay | Admitting: *Deleted

## 2018-07-28 LAB — CA 125: Cancer Antigen (CA) 125: 8.8 U/mL (ref 0.0–38.1)

## 2018-07-28 NOTE — Telephone Encounter (Signed)
Called and spoke with the patient regarding her appt for 5/14. Patient did not want to come in for her due to [place], patient request a phone visit. Patient stated "My computer isn't working, so I can't do a virtual visit."

## 2018-07-29 ENCOUNTER — Ambulatory Visit (HOSPITAL_COMMUNITY): Admission: RE | Admit: 2018-07-29 | Payer: Medicare PPO | Source: Ambulatory Visit

## 2018-07-29 ENCOUNTER — Ambulatory Visit (HOSPITAL_COMMUNITY): Payer: Medicare PPO

## 2018-07-29 ENCOUNTER — Telehealth: Payer: Self-pay

## 2018-07-29 NOTE — Telephone Encounter (Signed)
Told Caitlyn Higgins that the CA-125 was 8.8 on 07-27-18 which is stable and in normal range. The CT scan showed no evidence of recurrent or metastatic carcinoma within the abdomen and pelvis. Stable exam per Joylene John, NP. Told Caitlyn Higgins that she still needs to keep the phone visit with Dr. Skeet Latch on Thursday 08-04-18 ~ 1:30 pm. Pt verbalized understanding.

## 2018-08-03 ENCOUNTER — Other Ambulatory Visit: Payer: Self-pay | Admitting: Hematology and Oncology

## 2018-08-03 DIAGNOSIS — I700 Atherosclerosis of aorta: Secondary | ICD-10-CM | POA: Insufficient documentation

## 2018-08-03 NOTE — Progress Notes (Signed)
[facility] PHONE  VISIT    Seward Carol, MD [address] [place] [address]   CHIEF COMPLAINT:  uterine carcinosarcoma, ovarian clear cell cancer; left lower quadrant pain  : Cancer Staging Endometrial cancer ([place]) Staging form: Corpus Uteri - Carcinoma, AJCC 7th Edition - Clinical: Stage IVB (T3b, NX, M1, Free text: high grade carcinosarcoma M1) - Signed by Gordy Levan, MD on 05/03/2013 - Pathologic: Stage IVB (T3b, NX, M1) - Signed by Gordy Levan, MD on 05/03/2013  Left ovarian epithelial cancer ([place]) Staging form: Ovary, AJCC 7th Edition - Clinical: Stage IV (T1c, NX, M1) - Signed by Gordy Levan, MD on 09/09/2013 - Pathologic: No stage assigned - Unsigned   SUMMARY OF ONCOLOGIC HISTORY: Oncology History   Recurrent endometrial cancer, strong ER positive 90%, PR 90%, MSI stable Clear cell mixed features of ovaries in 2015 Recurrent disease 2018: low grade endometroid       Endometrial cancer ([place])   02/24/2013 Miscellaneous    Pulmonary embolus  IVC filter was placed and she was started on Lovenox.  She was subsequently transitioned to warfarin.     03/07/2013 Pathology Results    1. Cervix, biopsy - HIGH GRADE ENDOMETRIAL CARCINOMA, SEE COMMENT. 2. Endometrium, biopsy - HIGH GRADE ENDOMETRIAL CARCINOMA, SEE COMMENT. Microscopic Comment 1. , 2. In both parts 1 and 2, slide sections demonstrate extensive involvement by high grade endometrial carcinoma demonstrating a biphasic epithelial and stromal architecture. The immunophenotype (strong diffuse estrogen receptor; strong diffuse vimentin expression; patchy monoclonal CEA expression, patchy p16 immunostain expression, and diffuse mild to moderate p53 expression) is consistent with primary endometrial origin. 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      03/14/2013 - 01/04/2014 Chemotherapy    taxol carboplatin x 9    08/01/2013 Surgery    Procedure(s) Performed: 1. Exploratory laparotomy with total hysterectomy bilateral salpingo-oophorectomy, omental biopsies.  Surgeon: Francetta Found.  Skeet Latch, MD., PhD  Assistant Surgeon: Lahoma Crocker M.D.   Specimens: Uterus Cervix, Bilateral tubes / ovaries, lower  omentum.   Operative Findings: A 24cm left ovarian mass hemosiderine filled with omental adhesions.  Evidence of chemotherapeutic changes over the bowel.  Uterus 14 cm.  No palpable hepatic mets but the examination was limited by diaphragmatic adhesions.  No gross disease in the omentum. TAH BSO omentectomy Left ovary 20cm clear cell malignancy  Uterus Carcinosarcoma  Multiple microscopic foci, measuring up to 0.5 cm in greatest dimension. Histologic type: Carcinosarcoma (MMMT) with heterologous component (ossification) Grade: High grade Myometrial invasion: 2.1 cm where myometrium is 2.5 cm in thickness Cervical stromal involvement: No.    08/01/2013 Pathology Results    1. Adnexa - ovary +/- tube, neoplastic, left with omentum - HIGH GRADE CARCINOMA WITH BOTH CLEAR CELL CARCINOMA AND ENDOMETRIOID CARCINOMA COMPONENTS, 20 CM. PLEASE SEE ONCOLOGY TEMPLATE FOR DETAIL. 2. Uterus and cervix, with right fallopian tube and right ovary - MICROSCOPIC FOCI OF RESIDUAL CARCINOSARCOMA WITH HETEROLOGOUS ELEMENT, INVOLVING OUTER HALF OF THE MYOMETRIUM. - EXTENSIVE ADENOMYOSIS AND LEIOMYOMA. - CERVIX: BENIGN SQUAMOUS MUCOSA AND ENDOCERVICAL MUCOSA, NO DYSPLASIA OR MALIGNANCY. - RIGHT OVARY AND FALLOPIAN TUBES: NO PATHOLOGIC ABNORMALITIES. Microscopic Comment  1. OVARY Specimen(s): Left ovary and fallopian tube. Procedure: (including lymph node sampling): Left salpingo-oophorectomy Primary tumor site (including laterality): Left ovary Ovarian surface involvement: Present. Ovarian capsule intact without fragmentation: No. Maximum  tumor size (cm):  20 cm, gross measurement. Histologic type: Mixed surface epithelial carcinoma with predominant clear cell carcinoma component (70%) and minor endometrioid component (30%) Please see comment for detail. Grade: High grade Peritoneal implants: (specify invasive or non-invasive): N/A Pelvic extension (list additional structures on separate lines and if involved): No. Lymph nodes: number examined - N/A ; number positive - N/A TNM code: ypT1c2, pNX FIGO Stage (based on pathologic findings, needs clinical correlation): at least IC2 Comments: Received grossly is a 20 cm focally disrupted and partially collapsed multicystic ovary with attached/adherent omentum. Microscopically, sectioned show an invasive high grade surface carcinoma arising in a background of endometriotic cyst. The tumor is predominately composed of clear cell carcinoma component with minor endometrioid carcinoma component. No tumor involvement is identified in the adherent omentum tissue. Immunohistochemical stains were performed and the clear cell carcinoma is patchy positive for p16, ER, p53, negative for WT-1 and p63. Focal angiolymphatic invasion is present  The morphologic features are different from those seen in the uterus. 2. ONCOLOGY TABLE-UTERUS, CARCINOSARCOMA Specimen: Uterus, cervix and right fallopian tubes and ovaries. Procedure: Total hysterectomy and right salpingo-oophorectomy. Lymph node sampling performed: No. Specimen integrity: Intact. Maximum tumor size: Multiple microscopic foci, measuring up to 0.5 cm in greatest dimension. Histologic type: Carcinosarcoma (MMMT) with heterologous component (ossification) Grade: High grade Myometrial invasion: 2.1 cm where myometrium is 2.5 cm in thickness Cervical stromal involvement: No. Extent of involvement of other organs: No. Lymph - vascular invasion: Not identified. Peritoneal washings: N/A Lymph nodes: # examined N/A ; # positive N/A Pelvic lymph  nodes: N/A involved of N/A lymph nodes. Para-aortic lymph nodes: N/A involved of N/A lymph nodes. Other (specify involvement and site): N/A TNM code: ypT1b, ypNX FIGO Stage (based on pathologic findings, needs clinical correlation): IB Comment: Immunohistochemical stains were performed and the residual carcinoma is positive for p16, negative for WT-1, weakly positive for p53 and ER. The morphologic features are different from those seen in the left ovary. (HCL:gt, 08/04/13)    02/21/2014 - 03/09/2014 Radiation Therapy    Received vaginal brachytherapy TREATMENT DATES: 12/2, 12/7, 12/11, 12/14 and 03/09/14  SITE/DOSE: Vaginal Cuff 4 cm treatment length / 30 Gy in 5 fractions  Technique: HDR with Ir-192     12/17/2016 Relapse/Recurrence    2cm rubbery like mass in the RV septum on physical examination    12/23/2016 Imaging    Reproductive: Previous hysterectomy. New soft tissue mass seen between the vaginal cuff and anterior rectal wall which measures 2.7 x 2.6 cm on image 112/ 2. This is consistent with locally recurrent endometrial carcinoma.    01/05/2017 Imaging    1. Previously noted pelvic mass is similar in size and appearance to the prior examination from 12/23/2016 demonstrating some low-level hypermetabolism. This is nonspecific, and could represent a lesion of rectal, cervical or uterine origin. 2. No definite findings to suggest metastatic disease in the chest, abdomen or pelvis. 3. Small focus of hypermetabolism in the distal third of the esophagus without a perceivable abnormality on the CT portion of the examination. This could represent physiologic activity, a focal area of esophagitis, or an underlying lesion. Clinical correlation is recommended, with consideration for followup upper endoscopy if clinically appropriate. 4. Nonspecific low-level hypermetabolism in a left axillary lymph node which is  stable in size and appearance compared to prior examinations. This is favored to be benign, but correlation with routine annual mammography is recommended. 5. Dilatation of the pulmonic trunk (4 cm in diameter), concerning for pulmonary arterial hypertension.  6. Severe hepatic steatosis.    01/05/2017 PET scan    1. Previously noted pelvic mass is similar in size and appearance to the prior examination from 12/23/2016 demonstrating some low-level hypermetabolism. This is nonspecific, and could represent a lesion of rectal, cervical or uterine origin. 2. No definite findings to suggest metastatic disease in the chest, abdomen or pelvis. 3. Small focus of hypermetabolism in the distal third of the esophagus without a perceivable abnormality on the CT portion of the examination. This could represent physiologic activity, a focal area of esophagitis, or an underlying lesion. Clinical correlation is recommended, with consideration for followup upper endoscopy if clinically appropriate. 4. Nonspecific low-level hypermetabolism in a left axillary lymph node which is stable in size and appearance compared to prior examinations. This is favored to be benign, but correlation with routine annual mammography is recommended. 5. Dilatation of the pulmonic trunk (4 cm in diameter), concerning for pulmonary arterial hypertension. 6. Severe hepatic steatosis. 7. Aortic atherosclerosis, in addition to left anterior descending coronary artery disease. Please note that although the presence of coronary artery calcium documents the presence of coronary artery disease, the severity of this disease and any potential stenosis cannot be assessed on this non-gated CT examination. Assessment for potential risk factor modification, dietary therapy or pharmacologic therapy may be warranted, if clinically indicated. 8. Additional incidental findings, as above. Aortic Atherosclerosis (ICD10-I70.0).    01/27/2017 Surgery     She underwent diagnostic laparoscopy, robotic assisted resection of intraperitoneal mass, infracolic omentectomy, peritoneal biopsies and rigid proctoscopy. R1 resection with miliary disease remaining in tumor bed    02/04/2017 Pathology Results    A: Cul-de-sac mass, biopsy  - Endometrioid adenocarcinoma, FIGO grade 1 (architecture grade 1, nuclear grade 2) (see comment)  B: Peritoneum, pelvic, biopsy  - Fragments of fibrovascular tissue with hemorrhage - No malignancy identified  C: Cul-de-sac, biopsy  - Endometrioid adenocarcinoma, FIGO grade 1 (architecture grade 1, nuclear grade 2), with squamous differentiation (see comment)  D: Omentum, omentectomy  - Benign omentum  - No malignancy identified  The purpose of this addendum is to report the results of immunohistochemical stains for mismatch repair proteins and hormone receptors performed on a representative block of tumor.  Immunostains for mismatch repair proteins were performed on block C2 and show intact expression of MLH1, PMS2, MSH2, and MSH6 within the tumor. This is the normal phenotype and does NOT support a diagnosis of microsatellite instability/hereditary non-polyposis colorectal cancer (HNPCC) associated carcinoma. Molecular testing for additional microsatellite instability markers will be performed by the [employer] [phone]) and reported separately.  Estrogen receptor (Ventana, clone SP1):  Positive (90%, weak to moderate)  Progesterone receptor (Ventana, clone 1E2):  Positive (90%, strong)    02/16/2017 Procedure    Successful placement of a right internal jugular approach power injectable Port-A-Cath. The catheter is ready for immediate use.    03/04/2017 - 06/17/2017 Chemotherapy    The patient had Carboplatin and Taxol for chemotherapy treatment.      06/24/2017 -  Anti-estrogen oral therapy    She started letrozole    08/23/2017 Imaging    1. Soft tissue lesion seen posterior to  the vagina on the previous PET-CT is not evident today. 2. Trace free fluid in the pelvis. 3. Similar appearance tiny lymph nodes scattered along both common and external iliac chains. 4. 12 mm low-density lesion in the central spleen appears stable. Attention on follow-up recommended.    03/2018 Imaging    CT  Abd/Pelvis Other: Small right inguinal hernia contains fat. No free fluid. Soft tissue extending between the vaginal cuff and rectum (series 2, image 80) may be minimally more prominent, currently measuring 10 mm.  Musculoskeletal: Degenerative changes in the spine. No worrisome lytic or sclerotic lesions.  IMPRESSION: 1. Soft tissue extending between the vaginal cuff and rectum may be minimally thicker. Continued attention on follow-up exams is warranted. 2. Subtle low-density lesion in the spleen is unchanged. 3.  Aortic atherosclerosis (ICD10-170.0).      07/27/2018 Imaging    CT A/P IMPRESSION: 1. Stable exam.  No acute findings. 2. No evidence of recurrent or metastatic carcinoma within the abdomen or pelvis. 3. Hepatic steatosis.  Aortic Atherosclerosis     Ovarian cancer ([place])   08/01/2013 Initial Diagnosis    Ovarian cancer ([place]) Unstaged.  Incidental pathology finding Adnexa - ovary +/- tube, neoplastic, left with omentum - HIGH GRADE CARCINOMA WITH BOTH CLEAR CELL CARCINOMA AND ENDOMETRIOID CARCINOMA COMPONENTS, 20 CM.Marland Kitchen    2016 Genetic Testing     Results revealed patient has the following mutation(s): NEGATIVE       INTERVAL HISTORY:  She feels well and is compliant with letrozole.  Reports resolution in frequency of  left lower quadrant intermittent discomfort.  Diarrhea has resolved with dietary modifications.     Patient Active Problem List   Diagnosis Date Noted  . Deficiency anemia 07/23/2017  . Hot flashes related to aromatase inhibitor therapy 07/23/2017  . Steroid-induced hyperglycemia 05/27/2017  . Pancytopenia, acquired ([place])  05/06/2017  . Mucositis due to antineoplastic therapy 03/25/2017  . Goals of care, counseling/discussion 02/22/2017  . Chemotherapy-induced neuropathy ([place]) 09/24/2015  . Presence of IVC filter 08/14/2014  . Uterine carcinosarcoma ([place]) 08/14/2014  . Morbid obesity ([place]) 02/19/2014  . Left thyroid nodule 11/08/2013  . Family history of malignant neoplasm of breast   . Ovarian cancer ([place])   . Left ovarian epithelial cancer ([place]) 09/09/2013  . Endometrial cancer ([place]) 03/10/2013  . HTN (hypertension) 02/22/2013    has No Known Allergies.  MEDICAL HISTORY: Past Medical History:  Diagnosis Date  . Anemia   . Cancer ([facility])    uterine carcinosarcoma  . DVT (deep venous thrombosis) ([facility])    right leg 12'14  . Elevated blood uric acid level    tx. Allopurinol  . Family history of malignant neoplasm of breast   . GERD (gastroesophageal reflux disease)   . History of chemotherapy   . Hx of radiation therapy HDR   x 5 tx  completed 03/09/14  . Hypertension   . Ovarian cancer ([place]) 2015  . Pulmonary emboli ([place])    12'14 denies any sob  . Reflux   . Transfusion history 07-26-13   12'14, 04-14-13(2 units), 06-09-13    SURGICAL HISTORY: Past Surgical History:  Procedure Laterality Date  . ABDOMINAL HYSTERECTOMY N/A 08/01/2013   Procedure: EXPLORATORY LAPAROTOMY/HYSTERECTOMY TOTAL ABDOMINAL;  Surgeon: Janie Morning, MD;  Location: [facility];  Service: Gynecology;  Laterality: N/A;  . IR FLUORO GUIDE PORT INSERTION RIGHT  02/16/2017  . IR US GUIDE VASC ACCESS RIGHT  02/16/2017  . IVC Right    filter placed to prevent Pulmonary emboli  . PORTACATH PLACEMENT Right    rigth chest- remains  . removed tumor from rectum    . SALPINGOOPHORECTOMY Bilateral 08/01/2013   Procedure: BILATERAL SALPINGO OOPHORECTOMY/OMENTECTOMY ;  Surgeon: Janie Morning, MD;  Location: [facility];  Service: Gynecology;  Laterality: Bilateral;    SOCIAL HISTORY: Social History  Socioeconomic History  . Marital  status: Married    Spouse name: Not on file  . Number of children: Not on file  . Years of education: Not on file  . Highest education level: Not on file  Occupational History  . Not on file  Social Needs  . Financial resource strain: Not on file  . Food insecurity:    Worry: Not on file    Inability: Not on file  . Transportation needs:    Medical: Not on file    Non-medical: Not on file  Tobacco Use  . Smoking status: Never Smoker  . Smokeless tobacco: Never Used  Substance and Sexual Activity  . Alcohol use: No    Comment: Quit  2 yrs ago-social in past  . Drug use: No  . Sexual activity: Yes  Lifestyle  . Physical activity:    Days per week: Not on file    Minutes per session: Not on file  . Stress: Not on file  Relationships  . Social connections:    Talks on phone: Not on file    Gets together: Not on file    Attends religious service: Not on file    Active member of club or organization: Not on file    Attends meetings of clubs or organizations: Not on file    Relationship status: Not on file  . Intimate partner violence:    Fear of current or ex partner: Not on file    Emotionally abused: Not on file    Physically abused: Not on file    Forced sexual activity: Not on file  Other Topics Concern  . Not on file  Social History Narrative  . Not on file  The foster children are growing and she has much joy in her family life.  Her church family provides a great deal of emotional and spiritual support.  Mother died in 12-27-2022   FAMILY HISTORY: Family History  Problem Relation Age of Onset  . Diabetes Mother   . Hypertension Mother   . Hypertension Father   . Diabetes Father   . Prostate cancer Father 23       deceased 41  . Breast cancer Maternal Aunt 74       deceased 65  . Prostate cancer Maternal Uncle        5 of 6 mat uncles with prostate cancer; deceased  . Breast cancer Paternal Aunt 52       deceased 55  . Stomach cancer Paternal Grandmother         deceased 69  . Breast cancer Maternal Aunt 44       deceased 22  . Breast cancer Cousin 64       negative BRCAplus, PALB2 2015  . Breast cancer Paternal Aunt 62       deceased 80  . Thyroid disease Neg Hx     Review of Systems  Constitutional: Negative.  Negative for appetite change, chills, diaphoresis, fatigue and fever.  HENT:  Negative.   Eyes: Negative.   Respiratory: Negative.  Negative for cough and shortness of breath.   Cardiovascular: Negative.  Negative for chest pain and leg swelling.       Denies headache chest pain diaphoresis dizziness or other sensory changes  Gastrointestinal: Negative for abdominal distention, abdominal pain, blood in stool, constipation, diarrhea and vomiting.       Reports a couple of episodes of pulling-like comfort in the left lower quadrant  Endocrine: Negative for  hot flashes.  Genitourinary: Negative.    Musculoskeletal: Negative.   Skin: Negative.   Neurological: Negative.   Hematological: Negative.   Psychiatric/Behavioral: Negative.       PHYSICAL EXAMINATION  ECOG PERFORMANCE STATUS: 0 - Asymptomatic  There were no vitals filed for this visit. Wt Readings from Last 3 Encounters:  05/05/18 280 lb (127 kg)  03/31/18 280 lb (127 kg)  12/09/17 283 lb (128.4 kg)  LMP 03/09/2013  Blood pressure 165/102 189/90 Physical Exam Exam conducted with a chaperone present.  Constitutional:      General: She is not in acute distress.    Appearance: Normal appearance. She is well-developed.  HENT:     Head: Normocephalic and atraumatic.  Eyes:     General: No scleral icterus. Neck:     Musculoskeletal: Normal range of motion and neck supple.     Vascular: No JVD.     Trachea: No tracheal deviation.  Pulmonary:     Effort: Pulmonary effort is normal.  Abdominal:     General: There is no distension.     Palpations: Abdomen is soft. There is no fluid wave or mass.     Tenderness: There is no abdominal tenderness. There is no  guarding or rebound.     Hernia: No hernia is present.  Genitourinary:    Exam position: Supine.     Vagina: No tenderness or bleeding.     Adnexa:        Right: No mass, tenderness or fullness.         Left: No mass, tenderness or fullness.       Rectum: External hemorrhoid present. No mass or tenderness. Normal anal tone.  Skin:    Findings: No burn, laceration or lesion.     Nails: There is no clubbing.   Neurological:     Mental Status: She is alert and oriented to person, place, and time.  Psychiatric:        Mood and Affect: Mood is not anxious or depressed. Affect is not labile or blunt.        Speech: Speech normal.        Behavior: Behavior normal.        Thought Content: Thought content normal.     ASSESSMENT and THERAPY PLAN:  Caitlyn Higgins is a 53 y.o. 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  Endometrioid recurrence was identified in October 2018.  Surgical resection and chemotherapy with Taxol carboplatin was completed March 2019. Caitlyn Higgins reports intermittent  left lower quadrant discomfort. CT of the abdomen and pelvis  06/10/2018 notable for fat containing inguinal hernia, stable cuff, splenic and adenopathy changes.  Repeat imaging 07/27/2018 of the pelvis and abdomen 07/2018 NED  Continue letrozole as prescribed Follow-up in 3 months CA 125 today and repeat in three months Repeat imaging in 6 months 01/2019   Obesity  Restarted exercising but her gym has closed Informed of finding of atherosclerosis Advised to f/u with PCP Dr. Delfina Redwood as scheduled Anti hypertensive meds reordered  This was an 11 minute phone visit.  Caitlyn Higgins was safe at her home in [place] and able to speak privately.  She voiced an understanding of the limitations of virtual visits in this time of COVID pandemic.  An additional 5 minutes was sent in document review and completion.

## 2018-08-04 ENCOUNTER — Inpatient Hospital Stay (HOSPITAL_BASED_OUTPATIENT_CLINIC_OR_DEPARTMENT_OTHER): Payer: Medicare PPO | Admitting: Gynecologic Oncology

## 2018-08-04 DIAGNOSIS — Z7901 Long term (current) use of anticoagulants: Secondary | ICD-10-CM | POA: Diagnosis not present

## 2018-08-04 DIAGNOSIS — Z86711 Personal history of pulmonary embolism: Secondary | ICD-10-CM

## 2018-08-04 DIAGNOSIS — Z90722 Acquired absence of ovaries, bilateral: Secondary | ICD-10-CM | POA: Diagnosis not present

## 2018-08-04 DIAGNOSIS — Z90710 Acquired absence of both cervix and uterus: Secondary | ICD-10-CM | POA: Diagnosis not present

## 2018-08-04 DIAGNOSIS — C562 Malignant neoplasm of left ovary: Secondary | ICD-10-CM

## 2018-08-04 DIAGNOSIS — C541 Malignant neoplasm of endometrium: Secondary | ICD-10-CM | POA: Diagnosis not present

## 2018-08-04 DIAGNOSIS — I700 Atherosclerosis of aorta: Secondary | ICD-10-CM

## 2018-08-04 DIAGNOSIS — C786 Secondary malignant neoplasm of retroperitoneum and peritoneum: Secondary | ICD-10-CM | POA: Diagnosis not present

## 2018-08-04 DIAGNOSIS — Z923 Personal history of irradiation: Secondary | ICD-10-CM | POA: Diagnosis not present

## 2018-08-04 DIAGNOSIS — Z79811 Long term (current) use of aromatase inhibitors: Secondary | ICD-10-CM | POA: Diagnosis not present

## 2018-08-04 DIAGNOSIS — Z170 Estrogen receptor positive status [ER+]: Secondary | ICD-10-CM | POA: Diagnosis not present

## 2018-08-04 DIAGNOSIS — I10 Essential (primary) hypertension: Secondary | ICD-10-CM

## 2018-08-04 MED ORDER — OLMESARTAN MEDOXOMIL-HCTZ 20-12.5 MG PO TABS: 1.0000 | ORAL_TABLET | Freq: Every day | ORAL | 3 refills | Status: AC

## 2018-08-04 NOTE — Patient Instructions (Signed)
   Thank you very much Ms. MARSHAL SCHRECENGOST for allowing me to provide care for you today.  I appreciate your confidence in choosing our Gynecologic Oncology team.  If you have any questions about your visit today please call our office and we will get back to you as soon as possible.  Please consider using the website Medlineplus.gov as an Geneticist, molecular.   Francetta Found. Gianlucca Szymborski MD., PhD Gynecologic Oncology

## 2018-08-22 ENCOUNTER — Other Ambulatory Visit: Payer: Self-pay | Admitting: Hematology and Oncology

## 2018-08-22 ENCOUNTER — Telehealth: Payer: Self-pay | Admitting: Oncology

## 2018-08-22 DIAGNOSIS — C541 Malignant neoplasm of endometrium: Secondary | ICD-10-CM

## 2018-08-22 NOTE — Telephone Encounter (Signed)
Order is placed.

## 2018-08-22 NOTE — Telephone Encounter (Signed)
Called Caitlyn Higgins and asked if she is ready to have her port removed.  She said that she is ready.

## 2018-08-31 ENCOUNTER — Other Ambulatory Visit: Payer: Self-pay | Admitting: Radiology

## 2018-09-01 ENCOUNTER — Ambulatory Visit (HOSPITAL_COMMUNITY)
Admission: RE | Admit: 2018-09-01 | Discharge: 2018-09-01 | Disposition: A | Discharge status: Home / Self Care | Payer: Medicare PPO | Source: Ambulatory Visit | Attending: Hematology and Oncology | Admitting: Hematology and Oncology

## 2018-09-01 ENCOUNTER — Encounter (HOSPITAL_COMMUNITY): Payer: Self-pay

## 2018-09-01 ENCOUNTER — Other Ambulatory Visit: Payer: Self-pay

## 2018-09-01 DIAGNOSIS — K219 Gastro-esophageal reflux disease without esophagitis: Secondary | ICD-10-CM | POA: Insufficient documentation

## 2018-09-01 DIAGNOSIS — Z86718 Personal history of other venous thrombosis and embolism: Secondary | ICD-10-CM | POA: Diagnosis not present

## 2018-09-01 DIAGNOSIS — I10 Essential (primary) hypertension: Secondary | ICD-10-CM | POA: Diagnosis not present

## 2018-09-01 DIAGNOSIS — Z90710 Acquired absence of both cervix and uterus: Secondary | ICD-10-CM | POA: Insufficient documentation

## 2018-09-01 DIAGNOSIS — Z86711 Personal history of pulmonary embolism: Secondary | ICD-10-CM | POA: Diagnosis not present

## 2018-09-01 DIAGNOSIS — Z8543 Personal history of malignant neoplasm of ovary: Secondary | ICD-10-CM | POA: Insufficient documentation

## 2018-09-01 DIAGNOSIS — C541 Malignant neoplasm of endometrium: Secondary | ICD-10-CM | POA: Diagnosis not present

## 2018-09-01 DIAGNOSIS — Z8542 Personal history of malignant neoplasm of other parts of uterus: Secondary | ICD-10-CM | POA: Insufficient documentation

## 2018-09-01 DIAGNOSIS — Z452 Encounter for adjustment and management of vascular access device: Secondary | ICD-10-CM | POA: Diagnosis not present

## 2018-09-01 DIAGNOSIS — Z79899 Other long term (current) drug therapy: Secondary | ICD-10-CM | POA: Insufficient documentation

## 2018-09-01 DIAGNOSIS — Z803 Family history of malignant neoplasm of breast: Secondary | ICD-10-CM | POA: Insufficient documentation

## 2018-09-01 DIAGNOSIS — Z90722 Acquired absence of ovaries, bilateral: Secondary | ICD-10-CM | POA: Diagnosis not present

## 2018-09-01 HISTORY — PX: IR REMOVAL TUN ACCESS W/ PORT W/O FL MOD SED: IMG2290

## 2018-09-01 LAB — CBC WITH DIFFERENTIAL/PLATELET
Abs Immature Granulocytes: 0.04 10*3/uL (ref 0.00–0.07)
Basophils Absolute: 0 10*3/uL (ref 0.0–0.1)
Basophils Relative: 0 %
Eosinophils Absolute: 0 10*3/uL (ref 0.0–0.5)
Eosinophils Relative: 0 %
HCT: 33.1 % — ABNORMAL LOW (ref 36.0–46.0)
Hemoglobin: 10.6 g/dL — ABNORMAL LOW (ref 12.0–15.0)
Immature Granulocytes: 1 %
Lymphocytes Relative: 20 %
Lymphs Abs: 1.3 10*3/uL (ref 0.7–4.0)
MCH: 31.9 pg (ref 26.0–34.0)
MCHC: 32 g/dL (ref 30.0–36.0)
MCV: 99.7 fL (ref 80.0–100.0)
Monocytes Absolute: 0.5 10*3/uL (ref 0.1–1.0)
Monocytes Relative: 8 %
Neutro Abs: 4.5 10*3/uL (ref 1.7–7.7)
Neutrophils Relative %: 71 %
Platelets: 188 10*3/uL (ref 150–400)
RBC: 3.32 MIL/uL — ABNORMAL LOW (ref 3.87–5.11)
RDW: 14 % (ref 11.5–15.5)
WBC: 6.3 10*3/uL (ref 4.0–10.5)
nRBC: 0 % (ref 0.0–0.2)

## 2018-09-01 LAB — BASIC METABOLIC PANEL
Anion gap: 12 (ref 5–15)
BUN: 24 mg/dL — ABNORMAL HIGH (ref 6–20)
CO2: 24 mmol/L (ref 22–32)
Calcium: 8.7 mg/dL — ABNORMAL LOW (ref 8.9–10.3)
Chloride: 103 mmol/L (ref 98–111)
Creatinine, Ser: 1.3 mg/dL — ABNORMAL HIGH (ref 0.44–1.00)
GFR calc Af Amer: 54 mL/min — ABNORMAL LOW (ref >60)
GFR calc non Af Amer: 46 mL/min — ABNORMAL LOW (ref >60)
Glucose, Bld: 86 mg/dL (ref 70–99)
Potassium: 3.8 mmol/L (ref 3.5–5.1)
Sodium: 139 mmol/L (ref 135–145)

## 2018-09-01 LAB — PROTIME-INR
INR: 1 (ref 0.8–1.2)
Prothrombin Time: 13 seconds (ref 11.4–15.2)

## 2018-09-01 MED ORDER — LIDOCAINE HCL (PF) 1 % IJ SOLN
INTRAMUSCULAR | Status: AC | PRN
  Administered 2018-09-01: 10 mL

## 2018-09-01 MED ORDER — LIDOCAINE HCL 1 % IJ SOLN
INTRAMUSCULAR | Status: AC
  Filled 2018-09-01: qty 20

## 2018-09-01 MED ORDER — FENTANYL CITRATE (PF) 100 MCG/2ML IJ SOLN
INTRAMUSCULAR | Status: AC
  Filled 2018-09-01: qty 2

## 2018-09-01 MED ORDER — MIDAZOLAM HCL 2 MG/2ML IJ SOLN
INTRAMUSCULAR | Status: AC | PRN
  Administered 2018-09-01 (×2): 1 mg via INTRAVENOUS

## 2018-09-01 MED ORDER — SODIUM CHLORIDE 0.9 % IV SOLN
INTRAVENOUS | Status: DC
  Administered 2018-09-01: 15:00:00 via INTRAVENOUS

## 2018-09-01 MED ORDER — FENTANYL CITRATE (PF) 100 MCG/2ML IJ SOLN
INTRAMUSCULAR | Status: AC | PRN
  Administered 2018-09-01 (×2): 50 ug via INTRAVENOUS

## 2018-09-01 MED ORDER — MIDAZOLAM HCL 2 MG/2ML IJ SOLN
INTRAMUSCULAR | Status: AC
  Filled 2018-09-01: qty 4

## 2018-09-01 MED ORDER — DEXTROSE 5 % IV SOLN
3.0000 g | INTRAVENOUS | Status: AC
  Administered 2018-09-01: 15:00:00 3 g via INTRAVENOUS
  Filled 2018-09-01: qty 3

## 2018-09-01 NOTE — H&P (Signed)
Referring Physician(s): Heath Lark  Supervising Physician: Aletta Edouard  Patient Status:  Caitlyn Higgins OP  Chief Complaint:  "I'm getting my port out"  Subjective: Patient familiar to IR service from Port-A-Cath placement on 04/03/2013 followed by removal on 03/12/14 and replacement on 02/16/2017.  She has a history of recurrent endometrial carcinoma/carcinosarcoma as well as left ovarian epithelial carcinoma. She is status post surgery and chemoradiation.  She has no recurrent disease on recent imaging . She presents today for Port-A-Cath removal.  She currently denies fever, headache, chest pain, dyspnea, cough, back pain, nausea, vomiting or bleeding.  She does have some intermittent abdominal cramping.  Past Medical History:  Diagnosis Date  . Anemia   . Cancer (HCC)    uterine carcinosarcoma  . DVT (deep venous thrombosis) (HCC)    right leg 12'14  . Elevated blood uric acid level    tx. Allopurinol  . Family history of malignant neoplasm of breast   . GERD (gastroesophageal reflux disease)   . History of chemotherapy   . Hx of radiation therapy HDR   x 5 tx  completed 03/09/14  . Hypertension   . Ovarian cancer ([place]) 2015  . Pulmonary emboli ([place])    12'14 denies any sob  . Reflux   . Transfusion history 07-26-13   12'14, 04-14-13(2 units), 06-09-13   Past Surgical History:  Procedure Laterality Date  . ABDOMINAL HYSTERECTOMY N/A 08/01/2013   Procedure: EXPLORATORY LAPAROTOMY/HYSTERECTOMY TOTAL ABDOMINAL;  Surgeon: Janie Morning, MD;  Location: [facility]S;  Service: Gynecology;  Laterality: N/A;  . IR FLUORO GUIDE PORT INSERTION RIGHT  02/16/2017  . IR US GUIDE VASC ACCESS RIGHT  02/16/2017  . IVC Right    filter placed to prevent Pulmonary emboli  . PORTACATH PLACEMENT Right    rigth chest- remains  . removed tumor from rectum    . SALPINGOOPHORECTOMY Bilateral 08/01/2013   Procedure: BILATERAL SALPINGO OOPHORECTOMY/OMENTECTOMY ;  Surgeon: Janie Morning, MD;  Location: [facility]  ORS;  Service: Gynecology;  Laterality: Bilateral;      Allergies: Patient has no known allergies.  Medications: Prior to Admission medications   Medication Sig Start Date End Date Taking? Authorizing Provider  cetirizine (ZYRTEC) 10 MG tablet Take 10 mg by mouth daily.    [provider]  gabapentin (NEURONTIN) 300 MG capsule TAKE 1 CAPSULE BY MOUTH TWICE A DAY 08/03/18   Heath Lark, MD  letrozole (FEMARA) 2.5 MG tablet TAKE 1 TABLET BY MOUTH EVERY DAY 12/14/17   Cross, Lenna Sciara D, NP  lidocaine-prilocaine (EMLA) cream Apply 1 application topically as needed. 03/31/18   Cross, Lenna Sciara D, NP  olmesartan-hydrochlorothiazide (BENICAR HCT) 20-12.5 MG tablet Take 1 tablet by mouth daily. 08/04/18   Janie Morning, MD  traMADol (ULTRAM) 50 MG tablet Take 1 tablet (50 mg total) by mouth every 6 (six) hours as needed. 07/23/17   Heath Lark, MD     Vital Signs: BP 139/86   Pulse (!) 56   Temp 98.6 F (37 C) (Oral)   Resp 16   LMP 03/09/2013   Physical Exam awake, alert.  Chest clear to auscultation bilaterally.  Clean, intact right chest wall Port-A-Cath.  Heart with bradycardic but regular rhythm.  Abdomen obese, soft, positive bowel sounds, nontender.  Trace to 1+ pretibial edema bilaterally.  Imaging: No results found.  Labs:  CBC: No results for input(s): WBC, HGB, HCT, PLT in the last 8760 hours.  COAGS: No results for input(s): INR, APTT in the last 8760 hours.  BMP: Recent Labs    03/31/18 1341 07/27/18 1115  NA 142 140  K 3.5 3.8  CL 104 105  CO2 28 24  GLUCOSE 108* 114*  BUN 7 18  CALCIUM 9.3 8.0*  CREATININE 1.04* 1.32*  GFRNONAA >60 46*  GFRAA >60 53*    LIVER FUNCTION TESTS: No results for input(s): BILITOT, AST, ALT, ALKPHOS, PROT, ALBUMIN in the last 8760 hours.  Assessment and Plan: Pt with history of recurrent endometrial carcinoma/carcinosarcoma as well as left ovarian epithelial carcinoma. She is status post surgery and chemoradiation.  She  has no recurrent disease on recent imaging . She presents today for Port-A-Cath removal.  Details/risks of procedure, including but not limited to, internal bleeding, infection, injury to adjacent structures discussed with patient with her understanding and consent.   Electronically Signed: D. Rowe Robert, PA-C 09/01/2018, 1:22 PM   I spent a total of 20 minutes at the the patient's bedside AND on the patient's hospital floor or unit, greater than 50% of which was counseling/coordinating care for Port-A-Cath removal

## 2018-09-01 NOTE — Procedures (Signed)
Interventional Radiology Procedure Note  Procedure: Port removal  Complications: None  Estimated Blood Loss: < 10 mL  Findings: Right chest port removed in entirety. Wound closed.  Reshma Hoey T. Jamieka Royle, M.D Pager:  [phone]    

## 2018-09-01 NOTE — Discharge Instructions (Signed)
Moderate Conscious Sedation, Adult, Care After These instructions provide you with information about caring for yourself after your procedure. Your health care provider may also give you more specific instructions. Your treatment has been planned according to current medical practices, but problems sometimes occur. Call your health care provider if you have any problems or questions after your procedure. What can I expect after the procedure? After your procedure, it is common:  To feel sleepy for several hours.  To feel clumsy and have poor balance for several hours.  To have poor judgment for several hours.  To vomit if you eat too soon. Follow these instructions at home: For at least 24 hours after the procedure:   Do not: ? Participate in activities where you could fall or become injured. ? Drive. ? Use heavy machinery. ? Drink alcohol. ? Take sleeping pills or medicines that cause drowsiness. ? Make important decisions or sign legal documents. ? Take care of children on your own.  Rest. Eating and drinking  Follow the diet recommended by your health care provider.  If you vomit: ? Drink water, juice, or soup when you can drink without vomiting. ? Make sure you have little or no nausea before eating solid foods. General instructions  Have a responsible adult stay with you until you are awake and alert.  Take over-the-counter and prescription medicines only as told by your health care provider.  If you smoke, do not smoke without supervision.  Keep all follow-up visits as told by your health care provider. This is important. Contact a health care provider if:  You keep feeling nauseous or you keep vomiting.  You feel light-headed.  You develop a rash.  You have a fever. Get help right away if:  You have trouble breathing. This information is not intended to replace advice given to you by your health care provider. Make sure you discuss any questions you have  with your health care provider. Document Released: 12/28/2012 Document Revised: 08/12/2015 Document Reviewed: 06/29/2015 Elsevier Interactive Patient Education  2019 [place] Removal, Care After This sheet gives you information about how to care for yourself after your procedure. Your health care provider may also give you more specific instructions. If you have problems or questions, contact your health care provider. What can I expect after the procedure? After the procedure, it is common to have:  Soreness or pain near your incision.  Some swelling or bruising near your incision. Follow these instructions at home: Medicines  Take over-the-counter and prescription medicines only as told by your health care provider.  If you were prescribed an antibiotic medicine, take it as told by your health care provider. Do not stop taking the antibiotic even if you start to feel better. Bathing  Do not take baths, swim, or use a hot tub until your health care provider approves. Ask your health care provider if you can take showers. You may only be allowed to take sponge baths. Incision care   Follow instructions from your health care provider about how to take care of your incision. Make sure you: ? Wash your hands with soap and water before you change your bandage (dressing). If soap and water are not available, use hand sanitizer. ? Change your dressing as told by your health care provider. ? Keep your dressing dry. ? Leave stitches (sutures), skin glue, or adhesive strips in place. These skin closures may need to stay in place for 2 weeks or longer. If  adhesive strip edges start to loosen and curl up, you may trim the loose edges. Do not remove adhesive strips completely unless your health care provider tells you to do that.  Check your incision area every day for signs of infection. Check for: ? More redness, swelling, or pain. ? More fluid or  blood. ? Warmth. ? Pus or a bad smell. Driving   Do not drive for 24 hours if you were given a medicine to help you relax (sedative) during your procedure.  If you did not receive a sedative, ask your health care provider when it is safe to drive. Activity  Return to your normal activities as told by your health care provider. Ask your health care provider what activities are safe for you.  Do not lift anything that is heavier than 10 lb (4.5 kg), or the limit that you are told, until your health care provider says that it is safe.  Do not do activities that involve lifting your arms over your head. General instructions  Do not use any products that contain nicotine or tobacco, such as cigarettes and e-cigarettes. These can delay healing. If you need help quitting, ask your health care provider.  Keep all follow-up visits as told by your health care provider. This is important. Contact a health care provider if:  You have more redness, swelling, or pain around your incision.  You have more fluid or blood coming from your incision.  Your incision feels warm to the touch.  You have pus or a bad smell coming from your incision.  You have pain that is not relieved by your pain medicine. Get help right away if you have:  A fever or chills.  Chest pain.  Difficulty breathing. Summary  After the procedure, it is common to have pain, soreness, swelling, or bruising near your incision.  If you were prescribed an antibiotic medicine, take it as told by your health care provider. Do not stop taking the antibiotic even if you start to feel better.  Do not drive for 24 hours if you were given a sedative during your procedure.  Return to your normal activities as told by your health care provider. Ask your health care provider what activities are safe for you. This information is not intended to replace advice given to you by your health care provider. Make sure you discuss any  questions you have with your health care provider. Document Released: 02/18/2015 Document Revised: 04/22/2017 Document Reviewed: 04/22/2017 Elsevier Interactive Patient Education  2019 [employer].

## 2018-11-04 DIAGNOSIS — I10 Essential (primary) hypertension: Secondary | ICD-10-CM | POA: Diagnosis not present

## 2018-11-04 DIAGNOSIS — Z6841 Body Mass Index (BMI) 40.0 and over, adult: Secondary | ICD-10-CM | POA: Diagnosis not present

## 2018-11-04 DIAGNOSIS — R7309 Other abnormal glucose: Secondary | ICD-10-CM | POA: Diagnosis not present

## 2018-11-04 DIAGNOSIS — C569 Malignant neoplasm of unspecified ovary: Secondary | ICD-10-CM | POA: Diagnosis not present

## 2018-11-15 ENCOUNTER — Other Ambulatory Visit: Payer: Self-pay | Admitting: Gynecologic Oncology

## 2018-11-15 DIAGNOSIS — I10 Essential (primary) hypertension: Secondary | ICD-10-CM

## 2018-12-26 ENCOUNTER — Other Ambulatory Visit: Payer: Self-pay | Admitting: Internal Medicine

## 2018-12-26 DIAGNOSIS — Z1231 Encounter for screening mammogram for malignant neoplasm of breast: Secondary | ICD-10-CM

## 2018-12-27 DIAGNOSIS — H2513 Age-related nuclear cataract, bilateral: Secondary | ICD-10-CM | POA: Diagnosis not present

## 2018-12-27 DIAGNOSIS — H40053 Ocular hypertension, bilateral: Secondary | ICD-10-CM | POA: Diagnosis not present

## 2018-12-27 DIAGNOSIS — H04123 Dry eye syndrome of bilateral lacrimal glands: Secondary | ICD-10-CM | POA: Diagnosis not present

## 2018-12-27 DIAGNOSIS — H43391 Other vitreous opacities, right eye: Secondary | ICD-10-CM | POA: Diagnosis not present

## 2019-02-02 ENCOUNTER — Telehealth: Payer: Self-pay | Admitting: *Deleted

## 2019-02-02 ENCOUNTER — Other Ambulatory Visit: Payer: Self-pay | Admitting: Gynecologic Oncology

## 2019-02-02 DIAGNOSIS — C541 Malignant neoplasm of endometrium: Secondary | ICD-10-CM

## 2019-02-02 DIAGNOSIS — C562 Malignant neoplasm of left ovary: Secondary | ICD-10-CM

## 2019-02-02 NOTE — Telephone Encounter (Signed)
Scheduled the patient for labs and CT scan at [facility], also scheduled the patient for MD visit with Dr. Skeet Latch at [facility]. Called and gave the patient the appt dates/times, along with instructions

## 2019-02-09 ENCOUNTER — Other Ambulatory Visit: Payer: Self-pay

## 2019-02-09 ENCOUNTER — Ambulatory Visit
Admission: RE | Admit: 2019-02-09 | Discharge: 2019-02-09 | Disposition: A | Discharge status: Home / Self Care | Payer: Medicare PPO | Source: Ambulatory Visit | Attending: Internal Medicine | Admitting: Internal Medicine

## 2019-02-09 DIAGNOSIS — Z1231 Encounter for screening mammogram for malignant neoplasm of breast: Secondary | ICD-10-CM

## 2019-03-02 ENCOUNTER — Other Ambulatory Visit: Payer: Self-pay

## 2019-03-02 ENCOUNTER — Inpatient Hospital Stay: Payer: Medicare PPO | Attending: Gynecologic Oncology

## 2019-03-02 DIAGNOSIS — C562 Malignant neoplasm of left ovary: Secondary | ICD-10-CM

## 2019-03-02 DIAGNOSIS — C541 Malignant neoplasm of endometrium: Secondary | ICD-10-CM | POA: Insufficient documentation

## 2019-03-02 LAB — BASIC METABOLIC PANEL
Anion gap: 11 (ref 5–15)
BUN: 23 mg/dL — ABNORMAL HIGH (ref 6–20)
CO2: 26 mmol/L (ref 22–32)
Calcium: 8.9 mg/dL (ref 8.9–10.3)
Chloride: 103 mmol/L (ref 98–111)
Creatinine, Ser: 1.34 mg/dL — ABNORMAL HIGH (ref 0.44–1.00)
GFR calc Af Amer: 52 mL/min — ABNORMAL LOW (ref >60)
GFR calc non Af Amer: 44 mL/min — ABNORMAL LOW (ref >60)
Glucose, Bld: 98 mg/dL (ref 70–99)
Potassium: 3.8 mmol/L (ref 3.5–5.1)
Sodium: 140 mmol/L (ref 135–145)

## 2019-03-03 ENCOUNTER — Other Ambulatory Visit: Payer: Medicare PPO

## 2019-03-03 DIAGNOSIS — H40053 Ocular hypertension, bilateral: Secondary | ICD-10-CM | POA: Diagnosis not present

## 2019-03-03 DIAGNOSIS — H2513 Age-related nuclear cataract, bilateral: Secondary | ICD-10-CM | POA: Diagnosis not present

## 2019-03-03 DIAGNOSIS — H04123 Dry eye syndrome of bilateral lacrimal glands: Secondary | ICD-10-CM | POA: Diagnosis not present

## 2019-03-03 DIAGNOSIS — H43391 Other vitreous opacities, right eye: Secondary | ICD-10-CM | POA: Diagnosis not present

## 2019-03-03 LAB — CA 125: Cancer Antigen (CA) 125: 7.1 U/mL (ref 0.0–38.1)

## 2019-03-06 ENCOUNTER — Ambulatory Visit (HOSPITAL_COMMUNITY)
Admission: RE | Admit: 2019-03-06 | Discharge: 2019-03-06 | Disposition: A | Discharge status: Home / Self Care | Payer: Medicare PPO | Source: Ambulatory Visit | Attending: Gynecologic Oncology | Admitting: Gynecologic Oncology

## 2019-03-06 ENCOUNTER — Other Ambulatory Visit: Payer: Self-pay | Admitting: Gynecologic Oncology

## 2019-03-06 ENCOUNTER — Other Ambulatory Visit: Payer: Self-pay

## 2019-03-06 DIAGNOSIS — C55 Malignant neoplasm of uterus, part unspecified: Secondary | ICD-10-CM | POA: Diagnosis not present

## 2019-03-06 DIAGNOSIS — C541 Malignant neoplasm of endometrium: Secondary | ICD-10-CM

## 2019-03-06 DIAGNOSIS — C562 Malignant neoplasm of left ovary: Secondary | ICD-10-CM

## 2019-03-06 MED ORDER — SODIUM CHLORIDE (PF) 0.9 % IJ SOLN
INTRAMUSCULAR | Status: AC
  Filled 2019-03-06: qty 50

## 2019-03-06 MED ORDER — IOHEXOL 300 MG/ML  SOLN: 100.0000 mL | Freq: Once | INTRAMUSCULAR | Status: DC | PRN

## 2019-03-07 ENCOUNTER — Other Ambulatory Visit: Payer: Self-pay | Admitting: Hematology and Oncology

## 2019-03-07 ENCOUNTER — Telehealth: Payer: Self-pay

## 2019-03-07 ENCOUNTER — Other Ambulatory Visit: Payer: Self-pay | Admitting: Gynecologic Oncology

## 2019-03-07 ENCOUNTER — Telehealth: Payer: Self-pay | Admitting: *Deleted

## 2019-03-07 ENCOUNTER — Other Ambulatory Visit: Payer: Self-pay

## 2019-03-07 DIAGNOSIS — C541 Malignant neoplasm of endometrium: Secondary | ICD-10-CM

## 2019-03-07 MED ORDER — LETROZOLE 2.5 MG PO TABS: 2.5000 mg | ORAL_TABLET | Freq: Every day | ORAL | 0 refills | Status: DC

## 2019-03-07 NOTE — Telephone Encounter (Signed)
Called and given below message. She verbalized understanding. 

## 2019-03-07 NOTE — Telephone Encounter (Signed)
I will do it Please remind her since Dr. Skeet Latch is following, she should call [facility] in the future for medication refills

## 2019-03-07 NOTE — Telephone Encounter (Signed)
email and fax last office note, labs and CT to Dr Skeet Latch. Power share films

## 2019-03-07 NOTE — Telephone Encounter (Signed)
-----   Message from Dorothyann Gibbs, NP sent at 03/06/2019  1:49 PM EST ----- Can you let her know her CA 125 value and her CT scan findings and fax to [facility] or scan to Dr. Skeet Latch bc think she is going to be seen there? Thank you!

## 2019-03-07 NOTE — Telephone Encounter (Signed)
She called and requested a 7 day supply of Letrozole sent to [facility] on [address]. She usually get mail order but it will not arrive in time.

## 2019-03-07 NOTE — Telephone Encounter (Signed)
Told Caitlyn Higgins that her CA-125 was good at 7.1. The CT scan was stable and did not show recurrent disease in the abdomen and pelvis. Information sent to Dr. Skeet Latch and power shared CT scan for her appointment on 03-21-19 at 2pm at [facility].

## 2019-03-21 DIAGNOSIS — C562 Malignant neoplasm of left ovary: Secondary | ICD-10-CM | POA: Diagnosis not present

## 2019-03-21 DIAGNOSIS — Z6841 Body Mass Index (BMI) 40.0 and over, adult: Secondary | ICD-10-CM | POA: Diagnosis not present

## 2019-04-18 ENCOUNTER — Other Ambulatory Visit: Payer: Self-pay | Admitting: Gynecologic Oncology

## 2019-04-18 DIAGNOSIS — I10 Essential (primary) hypertension: Secondary | ICD-10-CM

## 2019-05-20 ENCOUNTER — Ambulatory Visit: Payer: Self-pay | Attending: Internal Medicine

## 2019-05-20 DIAGNOSIS — Z23 Encounter for immunization: Secondary | ICD-10-CM | POA: Insufficient documentation

## 2019-05-20 NOTE — Progress Notes (Signed)
   Covid-19 Vaccination Clinic  Name:  Lilliana Dillen    MRN: [id number] DOB: March 07, 1966  05/20/2019  Ms. Mazzola was observed post Covid-19 immunization for 15 minutes without incidence. She was provided with Vaccine Information Sheet and instruction to access the V-Safe system.   Ms. Breault was instructed to call 911 with any severe reactions post vaccine: Marland Kitchen Difficulty breathing  . Swelling of your face and throat  . A fast heartbeat  . A bad rash all over your body  . Dizziness and weakness    Immunizations Administered    Name Date Dose VIS Date Route   Pfizer COVID-19 Vaccine 05/20/2019  5:20 PM 0.3 mL 03/03/2019 Intramuscular   Manufacturer: [place]   Lot: [id number]   [place]: [id number]

## 2019-06-10 ENCOUNTER — Ambulatory Visit: Payer: Self-pay | Attending: Internal Medicine

## 2019-06-10 DIAGNOSIS — Z23 Encounter for immunization: Secondary | ICD-10-CM

## 2019-06-10 NOTE — Progress Notes (Signed)
   Covid-19 Vaccination Clinic  Name:  Paxton Pamplona    MRN: [id number] DOB: 1966/01/25  06/10/2019  Ms. Anderman was observed post Covid-19 immunization for 15 minutes without incident. She was provided with Vaccine Information Sheet and instruction to access the V-Safe system.   Ms. Krutz was instructed to call 911 with any severe reactions post vaccine: Marland Kitchen Difficulty breathing  . Swelling of face and throat  . A fast heartbeat  . A bad rash all over body  . Dizziness and weakness   Immunizations Administered    Name Date Dose VIS Date Route   Pfizer COVID-19 Vaccine 06/10/2019  9:09 AM 0.3 mL 03/03/2019 Intramuscular   Manufacturer: [place]   Lot: [id number]   [place]: [id number]

## 2019-06-15 ENCOUNTER — Other Ambulatory Visit: Payer: Self-pay | Admitting: Gynecologic Oncology

## 2019-06-15 DIAGNOSIS — I10 Essential (primary) hypertension: Secondary | ICD-10-CM

## 2019-06-22 IMAGING — MG 2D DIGITAL SCREENING BILATERAL MAMMOGRAM WITH CAD AND ADJUNCT TO
8 of 13 series · 8 of 29 positions shown · non-contrast
Comparison: Previous exam(s).

CLINICAL DATA: Screening.

EXAM:
2D DIGITAL SCREENING BILATERAL MAMMOGRAM WITH CAD AND ADJUNCT TOMO

[R CV]
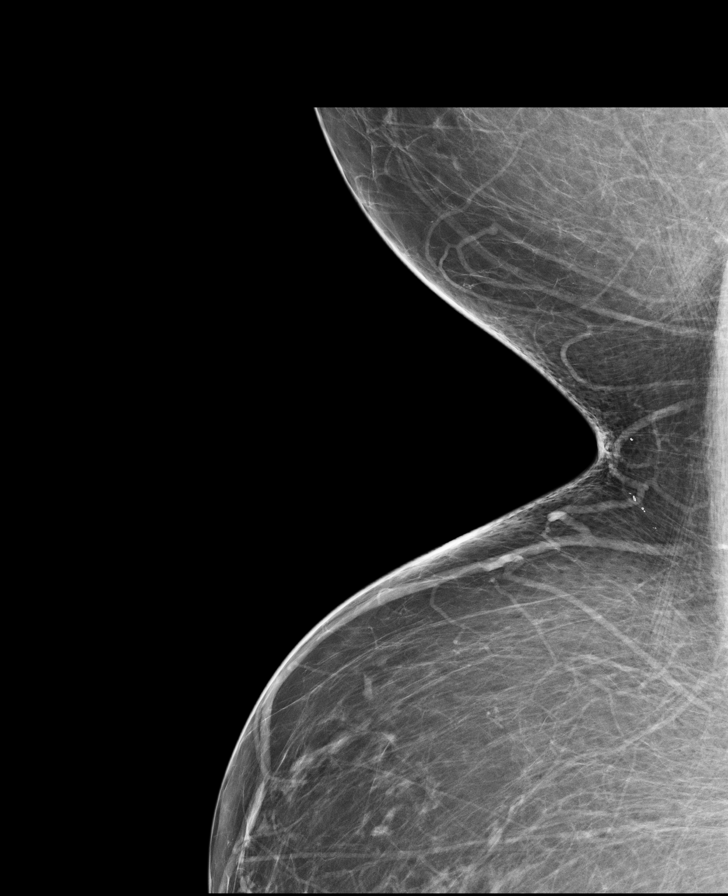

[R MLO]
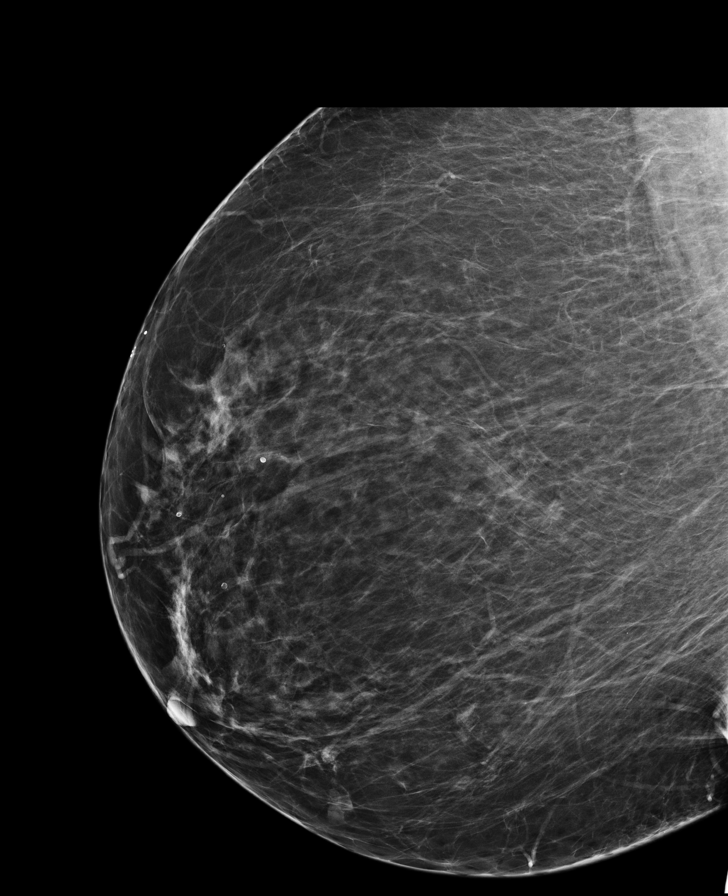

[R CC synth-2D]
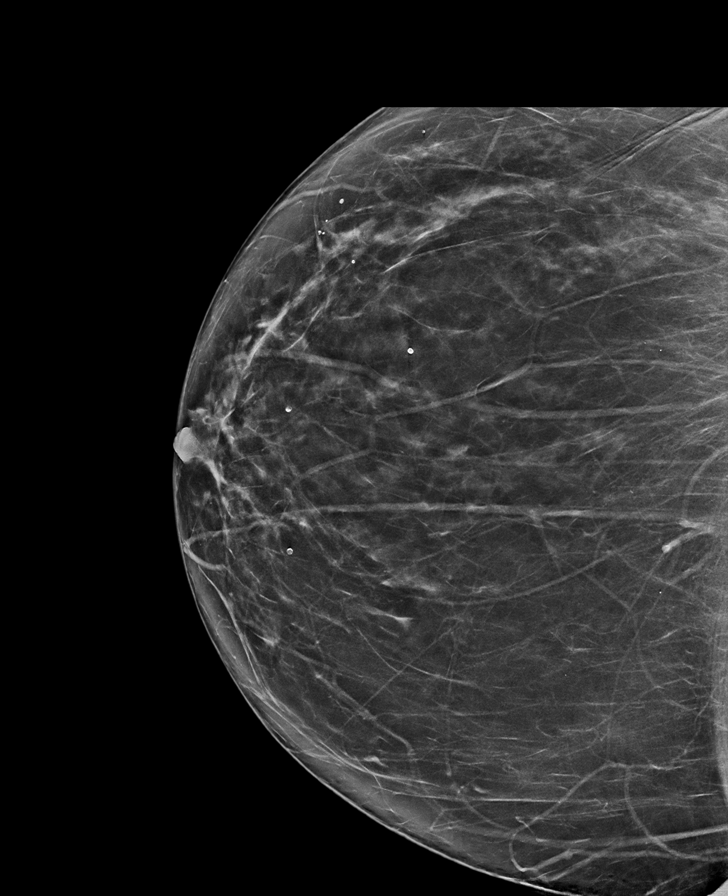

[L MLO]
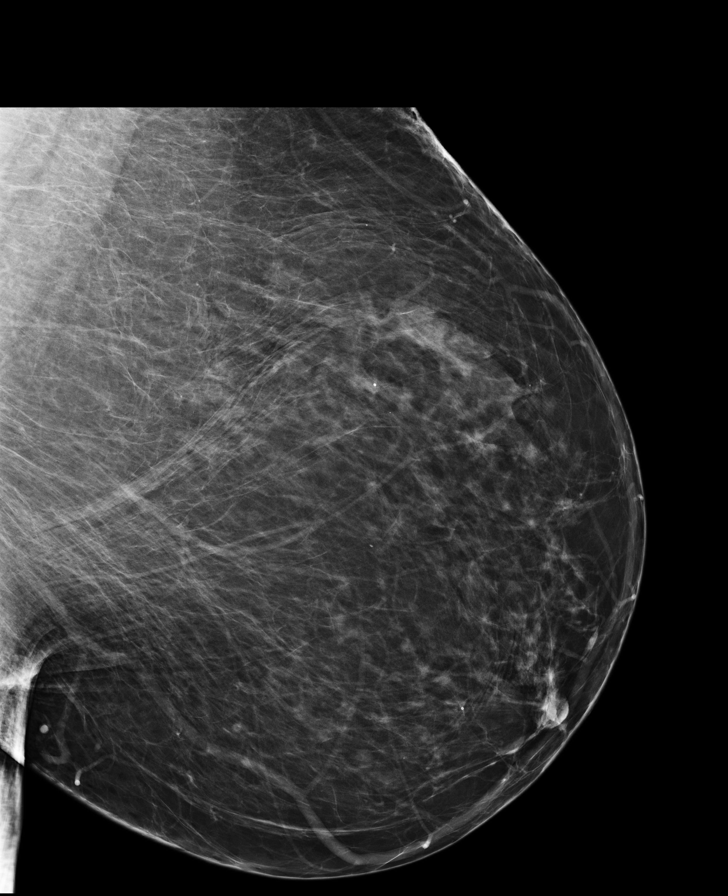

[L MLO synth-2D]
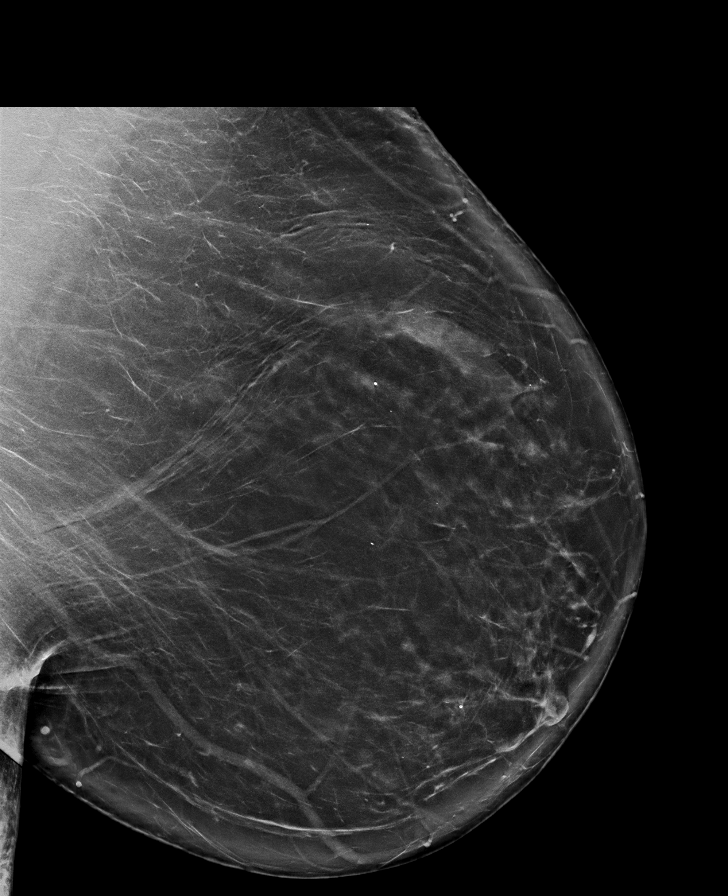

[L CC]
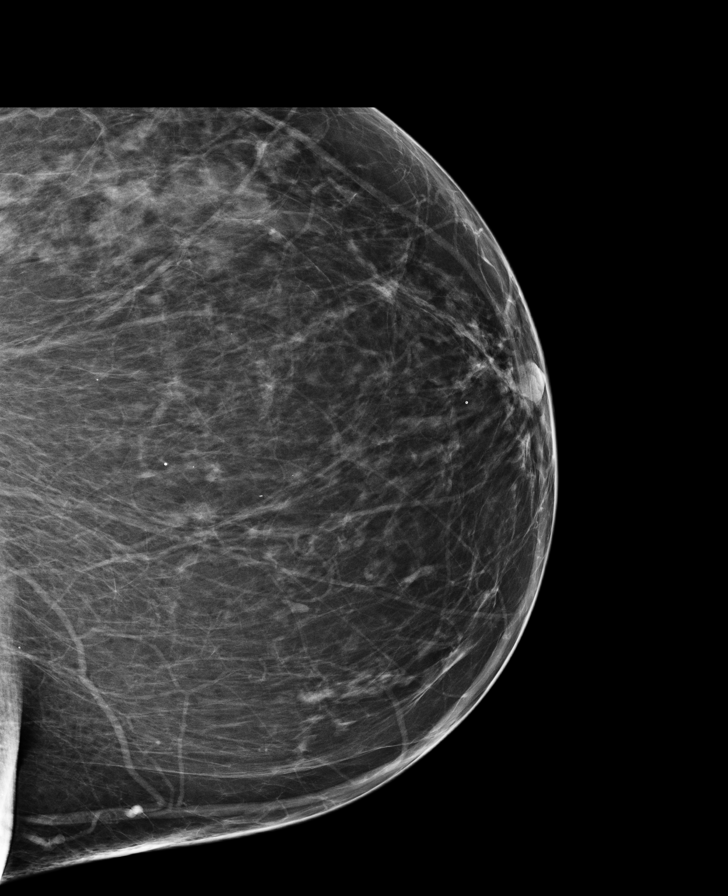

[R MLO synth-2D]
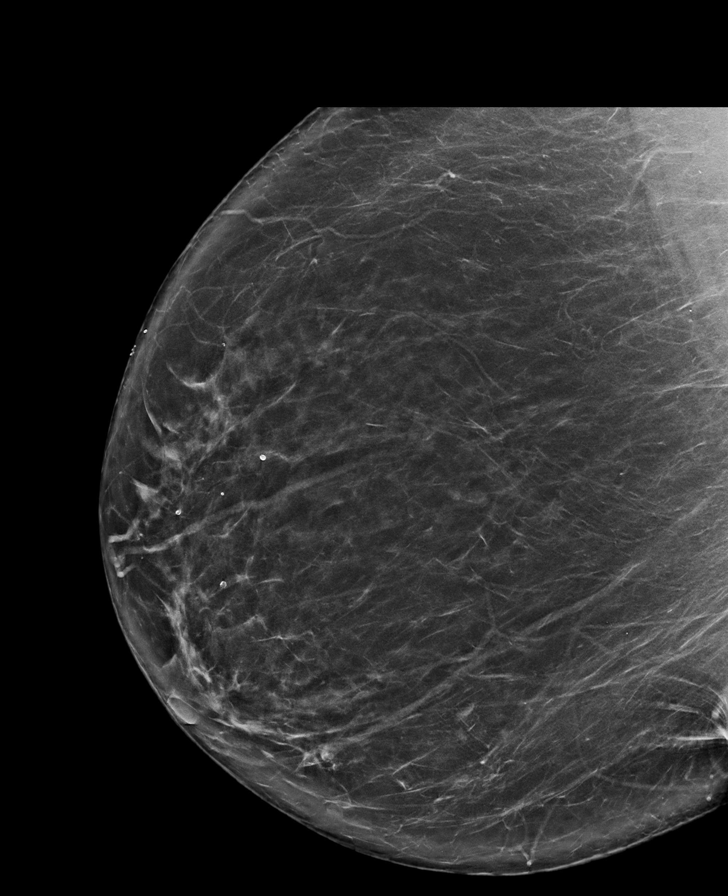

[L CC synth-2D]
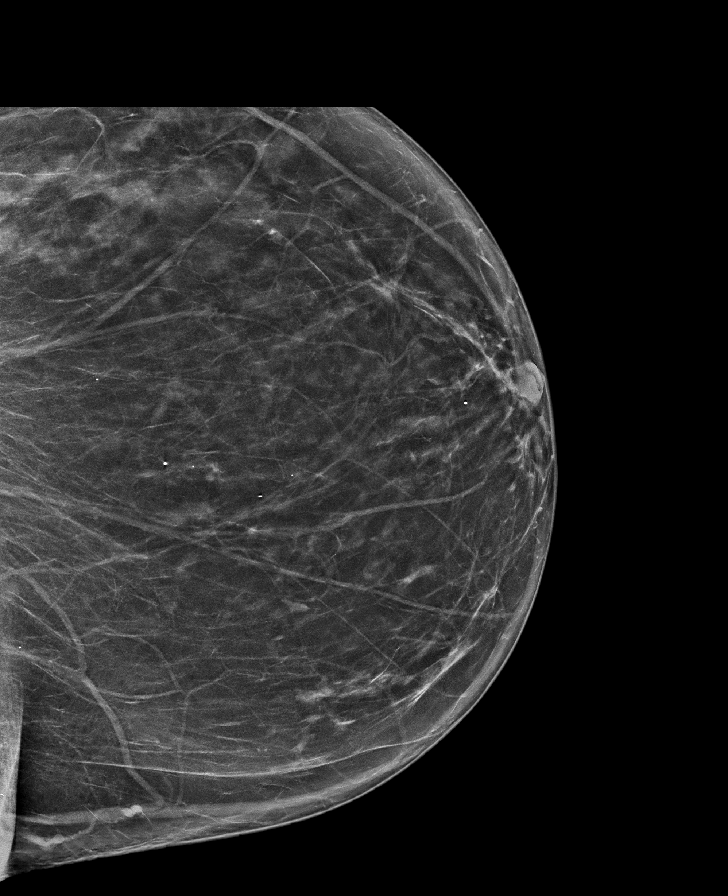

[8 of 29 positions shown; findings below may reference images not displayed]

ACR Breast Density Category b: There are scattered areas of
fibroglandular density.
FINDINGS: There are no findings suspicious for malignancy. Images were
processed with CAD.
IMPRESSION: No mammographic evidence of malignancy. A result letter of this
screening mammogram will be mailed directly to the patient.

RECOMMENDATION:
Screening mammogram in one year. (Code:97-6-RS4)

BI-RADS CATEGORY  1: Negative.

## 2019-06-27 NOTE — Telephone Encounter (Signed)
Discard

## 2019-07-14 ENCOUNTER — Other Ambulatory Visit: Payer: Self-pay | Admitting: Gynecologic Oncology

## 2019-07-14 DIAGNOSIS — Z6841 Body Mass Index (BMI) 40.0 and over, adult: Secondary | ICD-10-CM | POA: Diagnosis not present

## 2019-07-14 DIAGNOSIS — C55 Malignant neoplasm of uterus, part unspecified: Secondary | ICD-10-CM | POA: Diagnosis not present

## 2019-07-14 DIAGNOSIS — C562 Malignant neoplasm of left ovary: Secondary | ICD-10-CM

## 2019-07-14 DIAGNOSIS — C569 Malignant neoplasm of unspecified ovary: Secondary | ICD-10-CM | POA: Diagnosis not present

## 2019-07-14 DIAGNOSIS — C541 Malignant neoplasm of endometrium: Secondary | ICD-10-CM

## 2019-07-14 NOTE — Progress Notes (Signed)
CT CAP ordered per Dr. 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

## 2019-08-10 ENCOUNTER — Other Ambulatory Visit: Payer: Self-pay | Admitting: Gynecologic Oncology

## 2019-08-10 ENCOUNTER — Telehealth: Payer: Self-pay | Admitting: *Deleted

## 2019-08-10 DIAGNOSIS — C541 Malignant neoplasm of endometrium: Secondary | ICD-10-CM

## 2019-08-10 DIAGNOSIS — C562 Malignant neoplasm of left ovary: Secondary | ICD-10-CM

## 2019-08-10 NOTE — Progress Notes (Signed)
Patient requesting CT without IV contrast.  Fine to proceed per Dr. Skeet Latch.

## 2019-08-10 NOTE — Telephone Encounter (Signed)
Called and scheduled the patient for a CT scan on June 17th. Patient also scheduled for a lab.

## 2019-09-06 ENCOUNTER — Other Ambulatory Visit: Payer: Self-pay

## 2019-09-06 ENCOUNTER — Inpatient Hospital Stay: Payer: Medicare HMO | Attending: Hematology and Oncology

## 2019-09-06 DIAGNOSIS — C541 Malignant neoplasm of endometrium: Secondary | ICD-10-CM | POA: Diagnosis not present

## 2019-09-06 DIAGNOSIS — C562 Malignant neoplasm of left ovary: Secondary | ICD-10-CM

## 2019-09-06 LAB — BASIC METABOLIC PANEL
Anion gap: 11 (ref 5–15)
BUN: 26 mg/dL — ABNORMAL HIGH (ref 6–20)
CO2: 25 mmol/L (ref 22–32)
Calcium: 9.1 mg/dL (ref 8.9–10.3)
Chloride: 102 mmol/L (ref 98–111)
Creatinine, Ser: 1.73 mg/dL — ABNORMAL HIGH (ref 0.44–1.00)
GFR calc Af Amer: 38 mL/min — ABNORMAL LOW (ref >60)
GFR calc non Af Amer: 33 mL/min — ABNORMAL LOW (ref >60)
Glucose, Bld: 110 mg/dL — ABNORMAL HIGH (ref 70–99)
Potassium: 3.9 mmol/L (ref 3.5–5.1)
Sodium: 138 mmol/L (ref 135–145)

## 2019-09-07 ENCOUNTER — Ambulatory Visit (HOSPITAL_COMMUNITY)
Admission: RE | Admit: 2019-09-07 | Discharge: 2019-09-07 | Disposition: A | Discharge status: Home / Self Care | Payer: Medicare HMO | Source: Ambulatory Visit | Attending: Gynecologic Oncology | Admitting: Gynecologic Oncology

## 2019-09-07 ENCOUNTER — Ambulatory Visit (HOSPITAL_COMMUNITY): Payer: Medicare PPO

## 2019-09-07 DIAGNOSIS — C541 Malignant neoplasm of endometrium: Secondary | ICD-10-CM | POA: Diagnosis not present

## 2019-09-07 DIAGNOSIS — C562 Malignant neoplasm of left ovary: Secondary | ICD-10-CM | POA: Diagnosis not present

## 2019-09-07 DIAGNOSIS — C569 Malignant neoplasm of unspecified ovary: Secondary | ICD-10-CM | POA: Diagnosis not present

## 2019-09-07 DIAGNOSIS — R918 Other nonspecific abnormal finding of lung field: Secondary | ICD-10-CM | POA: Diagnosis not present

## 2019-09-07 DIAGNOSIS — K760 Fatty (change of) liver, not elsewhere classified: Secondary | ICD-10-CM | POA: Diagnosis not present

## 2019-09-08 ENCOUNTER — Telehealth: Payer: Self-pay

## 2019-09-08 NOTE — Telephone Encounter (Signed)
Told  Caitlyn Higgins that the CT scan was stable and showed no findings to suggest recurrent or metastatic disease. The report was e-mailed to Dr. Skeet Latch and power shared to her as well for her visit at [facility] on 09-15-19 by Alfredo Martinez on 09-08-19.

## 2019-09-11 ENCOUNTER — Other Ambulatory Visit (HOSPITAL_COMMUNITY): Payer: Medicare PPO

## 2019-09-15 DIAGNOSIS — C55 Malignant neoplasm of uterus, part unspecified: Secondary | ICD-10-CM | POA: Diagnosis not present

## 2019-09-15 DIAGNOSIS — Z6841 Body Mass Index (BMI) 40.0 and over, adult: Secondary | ICD-10-CM | POA: Diagnosis not present

## 2019-09-15 DIAGNOSIS — C569 Malignant neoplasm of unspecified ovary: Secondary | ICD-10-CM | POA: Diagnosis not present

## 2019-09-18 DIAGNOSIS — C569 Malignant neoplasm of unspecified ovary: Secondary | ICD-10-CM | POA: Diagnosis not present

## 2019-09-18 DIAGNOSIS — R7309 Other abnormal glucose: Secondary | ICD-10-CM | POA: Diagnosis not present

## 2019-09-18 DIAGNOSIS — Z0000 Encounter for general adult medical examination without abnormal findings: Secondary | ICD-10-CM | POA: Diagnosis not present

## 2019-09-18 DIAGNOSIS — Z1389 Encounter for screening for other disorder: Secondary | ICD-10-CM | POA: Diagnosis not present

## 2019-09-18 DIAGNOSIS — N1832 Chronic kidney disease, stage 3b: Secondary | ICD-10-CM | POA: Diagnosis not present

## 2019-09-18 DIAGNOSIS — I10 Essential (primary) hypertension: Secondary | ICD-10-CM | POA: Diagnosis not present

## 2019-12-06 ENCOUNTER — Other Ambulatory Visit: Payer: Self-pay | Admitting: Internal Medicine

## 2019-12-06 DIAGNOSIS — Z1231 Encounter for screening mammogram for malignant neoplasm of breast: Secondary | ICD-10-CM

## 2020-01-19 DIAGNOSIS — Z23 Encounter for immunization: Secondary | ICD-10-CM | POA: Diagnosis not present

## 2020-02-06 DIAGNOSIS — H40053 Ocular hypertension, bilateral: Secondary | ICD-10-CM | POA: Diagnosis not present

## 2020-02-06 DIAGNOSIS — H2513 Age-related nuclear cataract, bilateral: Secondary | ICD-10-CM | POA: Diagnosis not present

## 2020-02-06 DIAGNOSIS — H04123 Dry eye syndrome of bilateral lacrimal glands: Secondary | ICD-10-CM | POA: Diagnosis not present

## 2020-02-12 ENCOUNTER — Ambulatory Visit: Payer: Medicare HMO

## 2020-03-27 ENCOUNTER — Other Ambulatory Visit: Payer: Self-pay

## 2020-03-27 ENCOUNTER — Ambulatory Visit
Admission: RE | Admit: 2020-03-27 | Discharge: 2020-03-27 | Disposition: A | Discharge status: Home / Self Care | Payer: Medicare HMO | Source: Ambulatory Visit | Attending: Internal Medicine | Admitting: Internal Medicine

## 2020-03-27 DIAGNOSIS — Z1231 Encounter for screening mammogram for malignant neoplasm of breast: Secondary | ICD-10-CM

## 2020-04-11 DIAGNOSIS — C55 Malignant neoplasm of uterus, part unspecified: Secondary | ICD-10-CM | POA: Diagnosis not present

## 2020-04-11 DIAGNOSIS — C569 Malignant neoplasm of unspecified ovary: Secondary | ICD-10-CM | POA: Diagnosis not present

## 2020-04-11 DIAGNOSIS — Z6841 Body Mass Index (BMI) 40.0 and over, adult: Secondary | ICD-10-CM | POA: Diagnosis not present

## 2020-04-23 DIAGNOSIS — N1831 Chronic kidney disease, stage 3a: Secondary | ICD-10-CM | POA: Diagnosis not present

## 2020-04-23 DIAGNOSIS — C569 Malignant neoplasm of unspecified ovary: Secondary | ICD-10-CM | POA: Diagnosis not present

## 2020-04-23 DIAGNOSIS — D649 Anemia, unspecified: Secondary | ICD-10-CM | POA: Diagnosis not present

## 2020-04-23 DIAGNOSIS — I10 Essential (primary) hypertension: Secondary | ICD-10-CM | POA: Diagnosis not present

## 2020-04-23 DIAGNOSIS — G629 Polyneuropathy, unspecified: Secondary | ICD-10-CM | POA: Diagnosis not present

## 2020-04-23 DIAGNOSIS — E1169 Type 2 diabetes mellitus with other specified complication: Secondary | ICD-10-CM | POA: Diagnosis not present

## 2020-09-20 ENCOUNTER — Encounter: Payer: Self-pay | Admitting: Hematology and Oncology

## 2020-09-20 DIAGNOSIS — H04123 Dry eye syndrome of bilateral lacrimal glands: Secondary | ICD-10-CM | POA: Diagnosis not present

## 2020-09-20 DIAGNOSIS — H40053 Ocular hypertension, bilateral: Secondary | ICD-10-CM | POA: Diagnosis not present

## 2020-09-20 DIAGNOSIS — H2513 Age-related nuclear cataract, bilateral: Secondary | ICD-10-CM | POA: Diagnosis not present

## 2020-09-20 DIAGNOSIS — H43391 Other vitreous opacities, right eye: Secondary | ICD-10-CM | POA: Diagnosis not present

## 2020-11-18 DIAGNOSIS — C569 Malignant neoplasm of unspecified ovary: Secondary | ICD-10-CM | POA: Diagnosis not present

## 2020-11-18 DIAGNOSIS — Z6841 Body Mass Index (BMI) 40.0 and over, adult: Secondary | ICD-10-CM | POA: Diagnosis not present

## 2020-11-18 DIAGNOSIS — C55 Malignant neoplasm of uterus, part unspecified: Secondary | ICD-10-CM | POA: Diagnosis not present

## 2020-11-19 DIAGNOSIS — E1169 Type 2 diabetes mellitus with other specified complication: Secondary | ICD-10-CM | POA: Diagnosis not present

## 2020-11-19 DIAGNOSIS — N1831 Chronic kidney disease, stage 3a: Secondary | ICD-10-CM | POA: Diagnosis not present

## 2020-11-19 DIAGNOSIS — G629 Polyneuropathy, unspecified: Secondary | ICD-10-CM | POA: Diagnosis not present

## 2020-11-19 DIAGNOSIS — C569 Malignant neoplasm of unspecified ovary: Secondary | ICD-10-CM | POA: Diagnosis not present

## 2020-11-19 DIAGNOSIS — Z0000 Encounter for general adult medical examination without abnormal findings: Secondary | ICD-10-CM | POA: Diagnosis not present

## 2021-01-03 DIAGNOSIS — E1169 Type 2 diabetes mellitus with other specified complication: Secondary | ICD-10-CM | POA: Diagnosis not present

## 2021-01-21 DIAGNOSIS — H40053 Ocular hypertension, bilateral: Secondary | ICD-10-CM | POA: Diagnosis not present

## 2021-02-17 ENCOUNTER — Other Ambulatory Visit: Payer: Self-pay | Admitting: Internal Medicine

## 2021-02-17 DIAGNOSIS — Z1231 Encounter for screening mammogram for malignant neoplasm of breast: Secondary | ICD-10-CM

## 2021-04-14 ENCOUNTER — Ambulatory Visit
Admission: RE | Admit: 2021-04-14 | Discharge: 2021-04-14 | Disposition: A | Discharge status: Home / Self Care | Payer: Medicare HMO | Source: Ambulatory Visit | Attending: Internal Medicine | Admitting: Internal Medicine

## 2021-04-14 DIAGNOSIS — Z1231 Encounter for screening mammogram for malignant neoplasm of breast: Secondary | ICD-10-CM

## 2021-04-14 DIAGNOSIS — Z23 Encounter for immunization: Secondary | ICD-10-CM | POA: Diagnosis not present

## 2021-06-25 ENCOUNTER — Telehealth: Payer: Self-pay | Admitting: Hematology and Oncology

## 2021-06-25 NOTE — Telephone Encounter (Signed)
Scheduled appt per pt request. Pt is re-establishing her care with [place], okay per Dr. Alvy Bimler. Pt is aware of appt date and time. Pt stated she would have records from [facility] sent over to [place] from the last few years.  ?

## 2021-07-29 ENCOUNTER — Ambulatory Visit: Payer: Medicare HMO | Admitting: Hematology and Oncology

## 2021-08-19 ENCOUNTER — Other Ambulatory Visit: Payer: Self-pay | Admitting: Hematology and Oncology

## 2021-08-19 ENCOUNTER — Telehealth: Payer: Self-pay

## 2021-08-19 ENCOUNTER — Other Ambulatory Visit: Payer: Self-pay

## 2021-08-19 ENCOUNTER — Inpatient Hospital Stay: Payer: Medicare HMO | Attending: Hematology and Oncology | Admitting: Hematology and Oncology

## 2021-08-19 ENCOUNTER — Encounter: Payer: Self-pay | Admitting: Hematology and Oncology

## 2021-08-19 ENCOUNTER — Inpatient Hospital Stay: Payer: Medicare HMO

## 2021-08-19 VITALS — BP 129/61 | HR 74 | Temp 98.3°F | Resp 18 | Ht 67.0 in | Wt 284.8 lb

## 2021-08-19 DIAGNOSIS — Z86711 Personal history of pulmonary embolism: Secondary | ICD-10-CM

## 2021-08-19 DIAGNOSIS — Z780 Asymptomatic menopausal state: Secondary | ICD-10-CM

## 2021-08-19 DIAGNOSIS — Z6841 Body Mass Index (BMI) 40.0 and over, adult: Secondary | ICD-10-CM | POA: Insufficient documentation

## 2021-08-19 DIAGNOSIS — Z8543 Personal history of malignant neoplasm of ovary: Secondary | ICD-10-CM

## 2021-08-19 DIAGNOSIS — Z9221 Personal history of antineoplastic chemotherapy: Secondary | ICD-10-CM

## 2021-08-19 DIAGNOSIS — T451X5A Adverse effect of antineoplastic and immunosuppressive drugs, initial encounter: Secondary | ICD-10-CM | POA: Diagnosis not present

## 2021-08-19 DIAGNOSIS — Z90710 Acquired absence of both cervix and uterus: Secondary | ICD-10-CM | POA: Diagnosis not present

## 2021-08-19 DIAGNOSIS — G620 Drug-induced polyneuropathy: Secondary | ICD-10-CM

## 2021-08-19 DIAGNOSIS — M8580 Other specified disorders of bone density and structure, unspecified site: Secondary | ICD-10-CM | POA: Diagnosis not present

## 2021-08-19 DIAGNOSIS — C541 Malignant neoplasm of endometrium: Secondary | ICD-10-CM

## 2021-08-19 DIAGNOSIS — Z8542 Personal history of malignant neoplasm of other parts of uterus: Secondary | ICD-10-CM | POA: Diagnosis not present

## 2021-08-19 DIAGNOSIS — Z86718 Personal history of other venous thrombosis and embolism: Secondary | ICD-10-CM | POA: Diagnosis not present

## 2021-08-19 DIAGNOSIS — R232 Flushing: Secondary | ICD-10-CM

## 2021-08-19 DIAGNOSIS — Z923 Personal history of irradiation: Secondary | ICD-10-CM

## 2021-08-19 LAB — CMP (CANCER CENTER ONLY)
ALT: 17 U/L (ref 0–44)
AST: 27 U/L (ref 15–41)
Albumin: 4.1 g/dL (ref 3.5–5.0)
Alkaline Phosphatase: 76 U/L (ref 38–126)
Anion gap: 10 (ref 5–15)
BUN: 24 mg/dL — ABNORMAL HIGH (ref 6–20)
CO2: 27 mmol/L (ref 22–32)
Calcium: 7.7 mg/dL — ABNORMAL LOW (ref 8.9–10.3)
Chloride: 102 mmol/L (ref 98–111)
Creatinine: 1.43 mg/dL — ABNORMAL HIGH (ref 0.44–1.00)
GFR, Estimated: 43 mL/min — ABNORMAL LOW (ref >60)
Glucose, Bld: 96 mg/dL (ref 70–99)
Potassium: 3.5 mmol/L (ref 3.5–5.1)
Sodium: 139 mmol/L (ref 135–145)
Total Bilirubin: 0.7 mg/dL (ref 0.3–1.2)
Total Protein: 8.8 g/dL — ABNORMAL HIGH (ref 6.5–8.1)

## 2021-08-19 LAB — CBC WITH DIFFERENTIAL (CANCER CENTER ONLY)
Abs Immature Granulocytes: 0.04 10*3/uL (ref 0.00–0.07)
Basophils Absolute: 0 10*3/uL (ref 0.0–0.1)
Basophils Relative: 0 %
Eosinophils Absolute: 0 10*3/uL (ref 0.0–0.5)
Eosinophils Relative: 0 %
HCT: 28.3 % — ABNORMAL LOW (ref 36.0–46.0)
Hemoglobin: 9.7 g/dL — ABNORMAL LOW (ref 12.0–15.0)
Immature Granulocytes: 1 %
Lymphocytes Relative: 20 %
Lymphs Abs: 1.3 10*3/uL (ref 0.7–4.0)
MCH: 31.3 pg (ref 26.0–34.0)
MCHC: 34.3 g/dL (ref 30.0–36.0)
MCV: 91.3 fL (ref 80.0–100.0)
Monocytes Absolute: 0.4 10*3/uL (ref 0.1–1.0)
Monocytes Relative: 7 %
Neutro Abs: 4.8 10*3/uL (ref 1.7–7.7)
Neutrophils Relative %: 72 %
Platelet Count: 181 10*3/uL (ref 150–400)
RBC: 3.1 MIL/uL — ABNORMAL LOW (ref 3.87–5.11)
RDW: 14.2 % (ref 11.5–15.5)
WBC Count: 6.6 10*3/uL (ref 4.0–10.5)
nRBC: 0 % (ref 0.0–0.2)

## 2021-08-19 LAB — VITAMIN D 25 HYDROXY (VIT D DEFICIENCY, FRACTURES): Vit D, 25-Hydroxy: 24.55 ng/mL — ABNORMAL LOW (ref 30–100)

## 2021-08-19 NOTE — Assessment & Plan Note (Signed)
We discussed the risk of class III obesity and association with risk of cancer recurrence The patient is starting to exercise at the local [employer] I recommend the patient to sign up for healthy food choices/special classes with dietitian as well

## 2021-08-19 NOTE — Telephone Encounter (Signed)
She called back. Given appt for bone density on 6/15 at [facility], arrive 15 mins early for 0930 appt. She verbalized understanding.

## 2021-08-19 NOTE — Assessment & Plan Note (Signed)
Examination is benign but limited due to central obesity She tolerated Femara well without major side effects Due to risk of osteoporosis, I recommend bone density scan Due to her high risk for cancer recurrence, I recommend repeat CT imaging next week for further follow-up Due to her poor venous access, I will not order CT imaging with IV contrast

## 2021-08-19 NOTE — Progress Notes (Signed)
[name] FOLLOW-UP progress notes  Patient Care Team: Seward Carol, MD as PCP - General (Internal Medicine) Seward Carol, MD (Internal Medicine)  CHIEF COMPLAINTS/PURPOSE OF VISIT:  History of recurrent uterine cancer, for further management The patient also had history of ovarian cancer  HISTORY OF PRESENTING ILLNESS:  Caitlyn Higgins 56 y.o. female was transferred to my care after her prior physician is no longer seeing patients  She was last seen by myself in June 2019; she is billed as a new patient today She has been going to [facility] follow[facility]up and is taking Femara for recurrent low-grade endometrioid cancer  I reviewed the patient's records extensive and collaborated the history with the patient. Summary of her history is as follows: Oncology History Overview Note  Recurrent endometrial cancer, strong ER positive 90%, PR 90%, MSI stable Clear cell mixed features of ovaries in 2015 Recurrent disease 2018: low grade endometroid      Endometrial cancer ([place])  02/24/2013 Miscellaneous   Pulmonary embolus  IVC filter was placed and she was started on Lovenox.  She was subsequently transitioned to warfarin.    03/07/2013 Pathology Results   1. Cervix, biopsy - HIGH GRADE ENDOMETRIAL CARCINOMA, SEE COMMENT. 2. Endometrium, biopsy - HIGH GRADE ENDOMETRIAL CARCINOMA, SEE COMMENT. Microscopic Comment 1. , 2. In both parts 1 and 2, slide sections demonstrate extensive involvement by high grade endometrial carcinoma demonstrating a biphasic epithelial and stromal architecture. The immunophenotype (strong diffuse estrogen receptor; strong diffuse vimentin expression; patchy monoclonal CEA expression, patchy p16 immunostain expression, and diffuse mild to moderate p53 expression) is consistent with primary endometrial origin. 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     03/14/2013 - 01/04/2014 Chemotherapy   taxol carboplatin x 9   08/01/2013 Surgery   Procedure(s) Performed: 1. Exploratory laparotomy with total hysterectomy bilateral salpingo-oophorectomy, omental biopsies.   Surgeon: Francetta Found.  Skeet Latch, MD., PhD   Assistant Surgeon: Lahoma Crocker M.D.    Specimens: Uterus Cervix, Bilateral tubes / ovaries, lower  omentum.     Operative Findings: A 24cm left ovarian mass hemosiderine filled with omental adhesions.  Evidence of chemotherapeutic changes over the bowel.  Uterus 14 cm.  No palpable hepatic mets but the examination was limited by diaphragmatic adhesions.  No gross disease in the omentum. TAH BSO omentectomy Left ovary 20cm clear cell malignancy  Uterus Carcinosarcoma  Multiple microscopic foci, measuring up to 0.5 cm in greatest dimension. Histologic type: Carcinosarcoma (MMMT) with heterologous component (ossification) Grade: High grade Myometrial invasion: 2.1 cm where myometrium is 2.5 cm in thickness Cervical stromal involvement: No.   08/01/2013 Pathology Results   1. Adnexa - ovary +/- tube, neoplastic, left with omentum - HIGH GRADE CARCINOMA WITH BOTH CLEAR CELL CARCINOMA AND ENDOMETRIOID CARCINOMA COMPONENTS, 20 CM. PLEASE SEE ONCOLOGY TEMPLATE FOR DETAIL. 2. Uterus and cervix, with right fallopian tube and right ovary - MICROSCOPIC FOCI OF RESIDUAL CARCINOSARCOMA WITH HETEROLOGOUS ELEMENT, INVOLVING OUTER HALF OF THE MYOMETRIUM. - EXTENSIVE ADENOMYOSIS AND LEIOMYOMA. - CERVIX: BENIGN SQUAMOUS MUCOSA AND ENDOCERVICAL MUCOSA, NO DYSPLASIA OR MALIGNANCY. - RIGHT OVARY AND FALLOPIAN TUBES: NO PATHOLOGIC ABNORMALITIES. Microscopic Comment  1. OVARY Specimen(s): Left ovary and fallopian tube. Procedure: (including lymph node sampling): Left salpingo-oophorectomy Primary tumor site (including laterality): Left ovary Ovarian surface involvement:  Present. Ovarian capsule intact without fragmentation: No. Maximum tumor size (cm): 20 cm, gross  measurement. Histologic type: Mixed surface epithelial carcinoma with predominant clear cell carcinoma component (70%) and minor endometrioid component (30%) Please see comment for detail. Grade: High grade Peritoneal implants: (specify invasive or non-invasive): N/A Pelvic extension (list additional structures on separate lines and if involved): No. Lymph nodes: number examined - N/A ; number positive - N/A TNM code: ypT1c2, pNX FIGO Stage (based on pathologic findings, needs clinical correlation): at least IC2 Comments: Received grossly is a 20 cm focally disrupted and partially collapsed multicystic ovary with attached/adherent omentum. Microscopically, sectioned show an invasive high grade surface carcinoma arising in a background of endometriotic cyst. The tumor is predominately composed of clear cell carcinoma component with minor endometrioid carcinoma component. No tumor involvement is identified in the adherent omentum tissue. Immunohistochemical stains were performed and the clear cell carcinoma is patchy positive for p16, ER, p53, negative for WT-1 and p63. Focal angiolymphatic invasion is present  The morphologic features are different from those seen in the uterus. 2. ONCOLOGY TABLE-UTERUS, CARCINOSARCOMA Specimen: Uterus, cervix and right fallopian tubes and ovaries. Procedure: Total hysterectomy and right salpingo-oophorectomy. Lymph node sampling performed: No. Specimen integrity: Intact. Maximum tumor size: Multiple microscopic foci, measuring up to 0.5 cm in greatest dimension. Histologic type: Carcinosarcoma (MMMT) with heterologous component (ossification) Grade: High grade Myometrial invasion: 2.1 cm where myometrium is 2.5 cm in thickness Cervical stromal involvement: No. Extent of involvement of other organs: No. Lymph - vascular invasion: Not identified. Peritoneal  washings: N/A Lymph nodes: # examined N/A ; # positive N/A Pelvic lymph nodes: N/A involved of N/A lymph nodes. Para-aortic lymph nodes: N/A involved of N/A lymph nodes. Other (specify involvement and site): N/A TNM code: ypT1b, ypNX FIGO Stage (based on pathologic findings, needs clinical correlation): IB Comment: Immunohistochemical stains were performed and the residual carcinoma is positive for p16, negative for WT-1, weakly positive for p53 and ER. The morphologic features are different from those seen in the left ovary. (HCL:gt, 08/04/13)    02/21/2014 - 03/09/2014 Radiation Therapy   Received vaginal brachytherapy TREATMENT DATES:  12/2, 12/7, 12/11, 12/14 and 03/09/14                                          SITE/DOSE: Vaginal Cuff 4 cm treatment length  / 30 Gy in 5 fractions                      Technique:   HDR with Ir-192        12/17/2016 Relapse/Recurrence   2cm rubbery like mass in the RV septum on physical examination    12/23/2016 Imaging   Reproductive: Previous hysterectomy. New soft tissue mass seen between the vaginal cuff and anterior rectal wall which measures 2.7 x 2.6 cm on image 112/ 2. This is consistent with locally recurrent endometrial carcinoma.    01/05/2017 Imaging   1. Previously noted pelvic mass is similar in size and appearance to the prior examination from 12/23/2016 demonstrating some low-level hypermetabolism. This is nonspecific, and could represent a lesion of rectal, cervical or uterine origin. 2. No definite findings to suggest metastatic disease in the chest, abdomen or pelvis. 3. Small focus of hypermetabolism in the distal third of the esophagus without a perceivable abnormality on the CT portion of the examination. This could represent physiologic activity, a focal area of esophagitis, or an underlying lesion. Clinical correlation is recommended,  with consideration for followup upper endoscopy if clinically appropriate. 4.  Nonspecific low-level hypermetabolism in a left axillary lymph node which is stable in size and appearance compared to prior examinations. This is favored to be benign, but correlation with routine annual mammography is recommended. 5. Dilatation of the pulmonic trunk (4 cm in diameter), concerning for pulmonary arterial hypertension. 6. Severe hepatic steatosis.    01/05/2017 PET scan   1. Previously noted pelvic mass is similar in size and appearance to the prior examination from 12/23/2016 demonstrating some low-level hypermetabolism. This is nonspecific, and could represent a lesion of rectal, cervical or uterine origin. 2. No definite findings to suggest metastatic disease in the chest, abdomen or pelvis. 3. Small focus of hypermetabolism in the distal third of the esophagus without a perceivable abnormality on the CT portion of the examination. This could represent physiologic activity, a focal area of esophagitis, or an underlying lesion. Clinical correlation is recommended, with consideration for followup upper endoscopy if clinically appropriate. 4. Nonspecific low-level hypermetabolism in a left axillary lymph node which is stable in size and appearance compared to prior examinations. This is favored to be benign, but correlation with routine annual mammography is recommended. 5. Dilatation of the pulmonic trunk (4 cm in diameter), concerning for pulmonary arterial hypertension. 6. Severe hepatic steatosis. 7. Aortic atherosclerosis, in addition to left anterior descending coronary artery disease. Please note that although the presence of coronary artery calcium documents the presence of coronary artery disease, the severity of this disease and any potential stenosis cannot be assessed on this non-gated CT examination. Assessment for potential risk factor modification, dietary therapy or pharmacologic therapy may be warranted, if clinically indicated. 8. Additional incidental  findings, as above. Aortic Atherosclerosis (ICD10-I70.0).    01/27/2017 Surgery   She underwent diagnostic laparoscopy, robotic assisted resection of intraperitoneal mass, infracolic omentectomy, peritoneal biopsies and rigid proctoscopy. R1 resection with miliary disease remaining in tumor bed    02/04/2017 Pathology Results   A: Cul-de-sac mass, biopsy  - Endometrioid adenocarcinoma, FIGO grade 1 (architecture grade 1, nuclear grade 2) (see comment)  B: Peritoneum, pelvic, biopsy  - Fragments of fibrovascular tissue with hemorrhage - No malignancy identified  C: Cul-de-sac, biopsy  - Endometrioid adenocarcinoma, FIGO grade 1 (architecture grade 1, nuclear grade 2), with squamous differentiation (see comment)  D: Omentum, omentectomy  - Benign omentum  - No malignancy identified  The purpose of this addendum is to report the results of immunohistochemical stains for mismatch repair proteins and hormone receptors performed on a representative block of tumor.  Immunostains for mismatch repair proteins were performed on block C2 and show intact expression of MLH1, PMS2, MSH2, and MSH6 within the tumor. This is the normal phenotype and does NOT support a diagnosis of microsatellite instability/hereditary non-polyposis colorectal cancer (HNPCC) associated carcinoma. Molecular testing for additional microsatellite instability markers will be performed by the [employer] [id number]) and reported separately.  Estrogen receptor (Ventana, clone SP1):  Positive (90%, weak to moderate)  Progesterone receptor (Ventana, clone 1E2):  Positive (90%, strong)    02/16/2017 Procedure   Successful placement of a right internal jugular approach power injectable Port-A-Cath. The catheter is ready for immediate use.    03/04/2017 - 06/17/2017 Chemotherapy   The patient had Carboplatin and Taxol for chemotherapy treatment.      06/24/2017 -  Anti-estrogen oral therapy   She  started letrozole    08/23/2017 Imaging   1. Soft tissue lesion seen posterior to the vagina  on the previous PET-CT is not evident today. 2. Trace free fluid in the pelvis. 3. Similar appearance tiny lymph nodes scattered along both common and external iliac chains. 4. 12 mm low-density lesion in the central spleen appears stable. Attention on follow-up recommended.    03/2018 Imaging   CT Abd/Pelvis Other: Small right inguinal hernia contains fat. No free fluid. Soft tissue extending between the vaginal cuff and rectum (series 2, image 80) may be minimally more prominent, currently measuring 10 mm.   Musculoskeletal: Degenerative changes in the spine. No worrisome lytic or sclerotic lesions.   IMPRESSION: 1. Soft tissue extending between the vaginal cuff and rectum may be minimally thicker. Continued attention on follow-up exams is warranted. 2. Subtle low-density lesion in the spleen is unchanged. 3.  Aortic atherosclerosis (ICD10-170.0).        07/27/2018 Imaging   CT A/P IMPRESSION: 1. Stable exam.  No acute findings. 2. No evidence of recurrent or metastatic carcinoma within the abdomen or pelvis. 3. Hepatic steatosis.   Aortic Atherosclerosis    09/01/2018 Procedure   Removal of implanted Port-A-Cath utilizing sharp and blunt dissection. The procedure was uncomplicated   11/18/9369 Imaging   1. Stable CT of the chest, abdomen and pelvis. No findings to suggest recurrent or metastatic disease. 2. Hepatic steatosis. 3. Coronary artery calcifications. 4. Aortic atherosclerosis.   Aortic Atherosclerosis (ICD10-I70.0).   Ovarian cancer ([place])  08/01/2013 Initial Diagnosis   Ovarian cancer ([place]) Unstaged.  Incidental pathology finding Adnexa - ovary +/- tube, neoplastic, left with omentum - HIGH GRADE CARCINOMA WITH BOTH CLEAR CELL CARCINOMA AND ENDOMETRIOID CARCINOMA COMPONENTS, 20 CM.Marland Kitchen    2016 Genetic Testing    Results revealed patient has the following  mutation(s): NEGATIVE       MEDICAL HISTORY:  Past Medical History:  Diagnosis Date   Anemia    Cancer ([place])    uterine carcinosarcoma   DVT (deep venous thrombosis) ([facility])    right leg 12'14   Elevated blood uric acid level    tx. Allopurinol   Family history of malignant neoplasm of breast    GERD (gastroesophageal reflux disease)    History of chemotherapy    Hx of radiation therapy HDR   x 5 tx  completed 03/09/14   Hypertension    Ovarian cancer ([place]) 2015   Pulmonary emboli ([place])    12'14 denies any sob   Reflux    Transfusion history 07-26-13   12'14, 04-14-13(2 units), 06-09-13    SURGICAL HISTORY: Past Surgical History:  Procedure Laterality Date   ABDOMINAL HYSTERECTOMY N/A 08/01/2013   Procedure: EXPLORATORY LAPAROTOMY/HYSTERECTOMY TOTAL ABDOMINAL;  Surgeon: Janie Morning, MD;  Location: [facility];  Service: Gynecology;  Laterality: N/A;   IR FLUORO GUIDE PORT INSERTION RIGHT  02/16/2017   IR REMOVAL TUN ACCESS W/ PORT W/O FL MOD SED  09/01/2018   IR US GUIDE VASC ACCESS RIGHT  02/16/2017   IVC Right    filter placed to prevent Pulmonary emboli   PORTACATH PLACEMENT Right    rigth chest- remains   removed tumor from rectum     SALPINGOOPHORECTOMY Bilateral 08/01/2013   Procedure: BILATERAL SALPINGO OOPHORECTOMY/OMENTECTOMY ;  Surgeon: Janie Morning, MD;  Location: [facility];  Service: Gynecology;  Laterality: Bilateral;    SOCIAL HISTORY: Social History   Socioeconomic History   Marital status: Married    Spouse name: Not on file   Number of children: Not on file   Years of education: Not on file  Highest education level: Not on file  Occupational History   Not on file  Tobacco Use   Smoking status: Never   Smokeless tobacco: Never  Vaping Use   Vaping Use: Never used  Substance and Sexual Activity   Alcohol use: No    Comment: Quit  2 yrs ago-social in past   Drug use: No   Sexual activity: Yes  Other Topics Concern   Not on file  Social History  Narrative   Not on file   Social Determinants of Health   Financial Resource Strain: Not on file  Food Insecurity: Not on file  Transportation Needs: Not on file  Physical Activity: Not on file  Stress: Not on file  Social Connections: Not on file  Intimate Partner Violence: Not on file    FAMILY HISTORY: Family History  Problem Relation Age of Onset   Diabetes Mother    Hypertension Mother    Hypertension Father    Diabetes Father    Prostate cancer Father 10       deceased 37   Breast cancer Maternal Aunt 74       deceased 39   Prostate cancer Maternal Uncle        5 of 6 mat uncles with prostate cancer; deceased   Breast cancer Paternal Aunt 69       deceased 48   Stomach cancer Paternal Grandmother        deceased 28   Breast cancer Maternal Aunt 27-Jun-2045       deceased 72   Breast cancer Cousin 41       negative BRCAplus, PALB2 Jun 27, 2013   Breast cancer Paternal Aunt 86       deceased 48   Thyroid disease Neg Hx     ALLERGIES:  has no allergies on file.  MEDICATIONS:  Current Outpatient Medications  Medication Sig Dispense Refill   calcium carbonate (TUMS - DOSED IN MG ELEMENTAL CALCIUM) 500 MG chewable tablet Chew 1 tablet by mouth daily.     cholecalciferol (VITAMIN D3) 25 MCG (1000 UNIT) tablet Take 2,000 Units by mouth daily.     cetirizine (ZYRTEC) 10 MG tablet Take 10 mg by mouth daily.     gabapentin (NEURONTIN) 300 MG capsule TAKE 1 CAPSULE BY MOUTH TWICE A DAY 180 capsule 6   letrozole (FEMARA) 2.5 MG tablet Take 1 tablet (2.5 mg total) by mouth daily. 30 tablet 0   lidocaine-prilocaine (EMLA) cream Apply 1 application topically as needed. 30 g 1   olmesartan-hydrochlorothiazide (BENICAR HCT) 20-12.5 MG tablet Take 1 tablet by mouth daily. 30 tablet 3   traMADol (ULTRAM) 50 MG tablet Take 1 tablet (50 mg total) by mouth every 6 (six) hours as needed. 90 tablet 0   No current facility-administered medications for this visit.   She tolerated Femara well except  for hot flashes Denies abdominal pain, bloating, changes in bowel habits or vaginal bleeding  REVIEW OF SYSTEMS:   Constitutional: Denies fevers, chills or abnormal night sweats Eyes: Denies blurriness of vision, double vision or watery eyes Ears, nose, mouth, throat, and face: Denies mucositis or sore throat Respiratory: Denies cough, dyspnea or wheezes Cardiovascular: Denies palpitation, chest discomfort or lower extremity swelling Gastrointestinal:  Denies nausea, heartburn or change in bowel habits Skin: Denies abnormal skin rashes Lymphatics: Denies new lymphadenopathy or easy bruising Neurological:Denies numbness, tingling or new weaknesses Behavioral/Psych: Mood is stable, no new changes  All other systems were reviewed with the patient and are negative.  PHYSICAL EXAMINATION: ECOG PERFORMANCE STATUS: 0 - Asymptomatic  Vitals:   08/19/21 1254  BP: 129/61  Pulse: 74  Resp: 18  Temp: 98.3 F (36.8 C)   Filed Weights   08/19/21 1254  Weight: 284 lb 12.8 oz (129.2 kg)    GENERAL:alert, no distress and comfortable SKIN: skin color, texture, turgor are normal, no rashes or significant lesions EYES: normal, conjunctiva are pink and non-injected, sclera clear OROPHARYNX:no exudate, normal lips, buccal mucosa, and tongue  NECK: supple, thyroid normal size, non-tender, without nodularity LYMPH:  no palpable lymphadenopathy in the cervical, axillary or inguinal LUNGS: clear to auscultation and percussion with normal breathing effort HEART: regular rate & rhythm and no murmurs without lower extremity edema ABDOMEN:abdomen soft, non-tender and normal bowel sounds.  Well-healed surgical scar.  Limited examination due to central obesity Musculoskeletal:no cyanosis of digits and no clubbing  PSYCH: alert & oriented x 3 with fluent speech NEURO: no focal motor/sensory deficits  LABORATORY DATA:  I have reviewed the data as listed Lab Results  Component Value Date   WBC 6.3  09/01/2018   HGB 10.6 (L) 09/01/2018   HCT 33.1 (L) 09/01/2018   MCV 99.7 09/01/2018   PLT 188 09/01/2018   No results for input(s): NA, K, CL, CO2, GLUCOSE, BUN, CREATININE, CALCIUM, GFRNONAA, GFRAA, PROT, ALBUMIN, AST, ALT, ALKPHOS, BILITOT, BILIDIR, IBILI in the last 8760 hours.  I have reviewed documentation [facility] over the last few years  ASSESSMENT & PLAN:  Endometrial cancer ([place]) Examination is benign but limited due to central obesity She tolerated Femara well without major side effects Due to risk of osteoporosis, I recommend bone density scan Due to her high risk for cancer recurrence, I recommend repeat CT imaging next week for further follow-up Due to her poor venous access, I will not order CT imaging with IV contrast  Chemotherapy-induced neuropathy ([place]) She is still taking gabapentin and denies signs or symptoms of severe neuropathy  Hot flashes related to aromatase inhibitor therapy This is tolerable and not bothering her too much She will continue Femara  Obesity, Class III, BMI 40-49.9 (morbid obesity) ([place]) We discussed the risk of class III obesity and association with risk of cancer recurrence The patient is starting to exercise at the local [employer] I recommend the patient to sign up for healthy food choices/special classes with dietitian as well  Orders Placed This Encounter  Procedures   DG BONE DENSITY (DXA)    Standing Status:   Future    Standing Expiration Date:   08/20/2022    Order Specific Question:   Reason for Exam (SYMPTOM  OR DIAGNOSIS REQUIRED)    Answer:   osteopenia    Order Specific Question:   Is the patient pregnant?    Answer:   No    Order Specific Question:   Preferred imaging location?    Answer[facility]   CT CHEST WO CONTRAST    Standing Status:   Future    Standing Expiration Date:   08/20/2022    Order Specific Question:   Preferred imaging location?    Answer:   [facility]    Order Specific Question:    Radiology Contrast Protocol - do NOT remove file path    Answer:   [email or web address]    Order Specific Question:   Is patient pregnant?    Answer:   No   CT ABDOMEN PELVIS WO CONTRAST    Standing Status:   Future  Standing Expiration Date:   08/19/2022    Scheduling Instructions:     NO IV contrast due to poor venous access    Order Specific Question:   Is patient pregnant?    Answer:   No    Order Specific Question:   Preferred imaging location?    Answer:   [facility]    Order Specific Question:   Is Oral Contrast requested for this exam?    Answer:   Yes, Per Radiology protocol   CBC with Differential ([place] Only)    Standing Status:   Future    Number of Occurrences:   1    Standing Expiration Date:   08/20/2022   CMP ([place] only)    Standing Status:   Future    Number of Occurrences:   1    Standing Expiration Date:   08/20/2022   CA 125    Standing Status:   Future    Number of Occurrences:   1    Standing Expiration Date:   08/20/2022   VITAMIN D 25 Hydroxy (Vit-D Deficiency, Fractures)    Standing Status:   Future    Number of Occurrences:   1    Standing Expiration Date:   08/20/2022    All questions were answered. The patient knows to call the clinic with any problems, questions or concerns. The total time spent in the appointment was 60 minutes encounter with patients including review of chart and various tests results, discussions about plan of care and coordination of care plan   Heath Lark, MD 08/19/2021 1:38 PM

## 2021-08-19 NOTE — Assessment & Plan Note (Signed)
She is still taking gabapentin and denies signs or symptoms of severe neuropathy

## 2021-08-19 NOTE — Telephone Encounter (Signed)
-----   Message from Heath Lark, MD sent at 08/19/2021  1:40 PM EDT ----- Regarding: bone density scan Can you help me schedule DEXA scan next week? She had her mammogram on [address]

## 2021-08-19 NOTE — Assessment & Plan Note (Signed)
This is tolerable and not bothering her too much She will continue Femara

## 2021-08-19 NOTE — Telephone Encounter (Signed)
Called to give her appt for bone density. She ask that I call her later with details.

## 2021-08-20 LAB — CA 125: Cancer Antigen (CA) 125: 12.9 U/mL (ref 0.0–38.1)

## 2021-08-26 ENCOUNTER — Encounter (HOSPITAL_COMMUNITY): Payer: Self-pay

## 2021-08-26 ENCOUNTER — Ambulatory Visit (HOSPITAL_COMMUNITY)
Admission: RE | Admit: 2021-08-26 | Discharge: 2021-08-26 | Disposition: A | Discharge status: Home / Self Care | Payer: Medicare HMO | Source: Ambulatory Visit | Attending: Hematology and Oncology | Admitting: Hematology and Oncology

## 2021-08-26 DIAGNOSIS — C541 Malignant neoplasm of endometrium: Secondary | ICD-10-CM | POA: Diagnosis not present

## 2021-08-26 DIAGNOSIS — R918 Other nonspecific abnormal finding of lung field: Secondary | ICD-10-CM | POA: Diagnosis not present

## 2021-08-26 DIAGNOSIS — I700 Atherosclerosis of aorta: Secondary | ICD-10-CM | POA: Diagnosis not present

## 2021-08-26 DIAGNOSIS — K5730 Diverticulosis of large intestine without perforation or abscess without bleeding: Secondary | ICD-10-CM | POA: Diagnosis not present

## 2021-08-26 DIAGNOSIS — K760 Fatty (change of) liver, not elsewhere classified: Secondary | ICD-10-CM | POA: Diagnosis not present

## 2021-08-28 ENCOUNTER — Other Ambulatory Visit: Payer: Self-pay

## 2021-08-28 ENCOUNTER — Telehealth: Payer: Self-pay | Admitting: *Deleted

## 2021-08-28 ENCOUNTER — Inpatient Hospital Stay: Payer: Medicare HMO | Attending: Hematology and Oncology | Admitting: Hematology and Oncology

## 2021-08-28 ENCOUNTER — Ambulatory Visit: Payer: Medicare HMO

## 2021-08-28 ENCOUNTER — Encounter: Payer: Self-pay | Admitting: Hematology and Oncology

## 2021-08-28 VITALS — BP 127/75 | HR 69 | Temp 99.1°F | Resp 18 | Ht 67.0 in | Wt 283.8 lb

## 2021-08-28 DIAGNOSIS — Z9079 Acquired absence of other genital organ(s): Secondary | ICD-10-CM | POA: Insufficient documentation

## 2021-08-28 DIAGNOSIS — M8580 Other specified disorders of bone density and structure, unspecified site: Secondary | ICD-10-CM | POA: Insufficient documentation

## 2021-08-28 DIAGNOSIS — Z90722 Acquired absence of ovaries, bilateral: Secondary | ICD-10-CM | POA: Diagnosis not present

## 2021-08-28 DIAGNOSIS — Z90710 Acquired absence of both cervix and uterus: Secondary | ICD-10-CM | POA: Insufficient documentation

## 2021-08-28 DIAGNOSIS — Z79899 Other long term (current) drug therapy: Secondary | ICD-10-CM | POA: Diagnosis not present

## 2021-08-28 DIAGNOSIS — C541 Malignant neoplasm of endometrium: Secondary | ICD-10-CM | POA: Diagnosis not present

## 2021-08-28 DIAGNOSIS — Z8542 Personal history of malignant neoplasm of other parts of uterus: Secondary | ICD-10-CM | POA: Diagnosis not present

## 2021-08-28 DIAGNOSIS — Z9221 Personal history of antineoplastic chemotherapy: Secondary | ICD-10-CM | POA: Insufficient documentation

## 2021-08-28 DIAGNOSIS — E559 Vitamin D deficiency, unspecified: Secondary | ICD-10-CM | POA: Insufficient documentation

## 2021-08-28 DIAGNOSIS — N183 Chronic kidney disease, stage 3 unspecified: Secondary | ICD-10-CM | POA: Insufficient documentation

## 2021-08-28 DIAGNOSIS — Z923 Personal history of irradiation: Secondary | ICD-10-CM | POA: Insufficient documentation

## 2021-08-28 DIAGNOSIS — Z780 Asymptomatic menopausal state: Secondary | ICD-10-CM | POA: Diagnosis not present

## 2021-08-28 DIAGNOSIS — Z79811 Long term (current) use of aromatase inhibitors: Secondary | ICD-10-CM | POA: Insufficient documentation

## 2021-08-28 NOTE — Assessment & Plan Note (Signed)
We discussed the risk of class III obesity and association with risk of cancer recurrence The patient is starting to exercise at the local [employer] I recommend the patient to sign up for healthy food choices/special classes with dietitian as well We reviewed CT imaging which confirmed fatty liver disease She will work hard to lose weight

## 2021-08-28 NOTE — Telephone Encounter (Signed)
Left the patient a message to call the office back, patient needs to see Dr Delsa Sale in June

## 2021-08-28 NOTE — Assessment & Plan Note (Signed)
Examination is benign but limited due to central obesity She tolerated Femara well without major side effects Due to risk of osteoporosis, I recommend bone density scan I reviewed results of CT imaging which showed no evidence of disease I recommend GYn pelvic exam in the near future and see me in 6 months

## 2021-08-28 NOTE — Assessment & Plan Note (Signed)
We discussed importance of oral fluid hydration

## 2021-08-28 NOTE — Assessment & Plan Note (Signed)
I recommend vitamin D replacement therapy and calcium supplement

## 2021-08-28 NOTE — Progress Notes (Signed)
[place] OFFICE PROGRESS NOTE  Patient Care Team: Seward Carol, MD as PCP - General (Internal Medicine) Seward Carol, MD (Internal Medicine)  ASSESSMENT & PLAN:  Endometrial cancer [facility]) Examination is benign but limited due to central obesity She tolerated Femara well without major side effects Due to risk of osteoporosis, I recommend bone density scan I reviewed results of CT imaging which showed no evidence of disease I recommend GYn pelvic exam in the near future and see me in 6 months  Obesity, Class III, BMI 40-49.9 (morbid obesity) ([place]) We discussed the risk of class III obesity and association with risk of cancer recurrence The patient is starting to exercise at the local [employer] I recommend the patient to sign up for healthy food choices/special classes with dietitian as well We reviewed CT imaging which confirmed fatty liver disease She will work hard to lose weight  Osteopenia after menopause She is at risk of osteoporosis DEXA is pending, we discussed importance of vitamin D and calcium replacement  Vitamin D deficiency I recommend vitamin D replacement therapy and calcium supplement  CKD (chronic kidney disease), stage III ([place]) We discussed importance of oral fluid hydration  Orders Placed This Encounter  Procedures   CBC with Differential ([place] Only)    Standing Status:   Future    Standing Expiration Date:   08/29/2022   CMP ([place] only)    Standing Status:   Future    Standing Expiration Date:   08/29/2022   CA 125    Standing Status:   Standing    Number of Occurrences:   11    Standing Expiration Date:   08/29/2022    All questions were answered. The patient knows to call the clinic with any problems, questions or concerns. The total time spent in the appointment was 30 minutes encounter with patients including review of chart and various tests results, discussions about plan of care and coordination of care plan   Heath Lark, MD 08/28/2021 4:59 PM  INTERVAL HISTORY: Please see below for problem oriented charting. she returns for test results We spent 30 mins reviewing test results and discussed management  REVIEW OF SYSTEMS:   Constitutional: Denies fevers, chills or abnormal weight loss Eyes: Denies blurriness of vision Ears, nose, mouth, throat, and face: Denies mucositis or sore throat Respiratory: Denies cough, dyspnea or wheezes Cardiovascular: Denies palpitation, chest discomfort or lower extremity swelling Gastrointestinal:  Denies nausea, heartburn or change in bowel habits Skin: Denies abnormal skin rashes Lymphatics: Denies new lymphadenopathy or easy bruising Neurological:Denies numbness, tingling or new weaknesses Behavioral/Psych: Mood is stable, no new changes  All other systems were reviewed with the patient and are negative.  I have reviewed the past medical history, past surgical history, social history and family history with the patient and they are unchanged from previous note.  ALLERGIES:  has no allergies on file.  MEDICATIONS:  Current Outpatient Medications  Medication Sig Dispense Refill   calcium carbonate (TUMS - DOSED IN MG ELEMENTAL CALCIUM) 500 MG chewable tablet Chew 1 tablet by mouth daily.     cetirizine (ZYRTEC) 10 MG tablet Take 10 mg by mouth daily.     cholecalciferol (VITAMIN D3) 25 MCG (1000 UNIT) tablet Take 2,000 Units by mouth daily.     gabapentin (NEURONTIN) 300 MG capsule TAKE 1 CAPSULE BY MOUTH TWICE A DAY 180 capsule 6   letrozole (FEMARA) 2.5 MG tablet Take 1 tablet (2.5 mg total) by mouth daily.  30 tablet 0   lidocaine-prilocaine (EMLA) cream Apply 1 application topically as needed. 30 g 1   olmesartan-hydrochlorothiazide (BENICAR HCT) 20-12.5 MG tablet Take 1 tablet by mouth daily. 30 tablet 3   traMADol (ULTRAM) 50 MG tablet Take 1 tablet (50 mg total) by mouth every 6 (six) hours as needed. 90 tablet 0   No current facility-administered  medications for this visit.    SUMMARY OF ONCOLOGIC HISTORY: Oncology History Overview Note  Recurrent endometrial cancer, strong ER positive 90%, PR 90%, MSI stable Clear cell mixed features of ovaries in 2015 Recurrent disease 2018: low grade endometroid     Endometrial cancer ([place])  02/24/2013 Miscellaneous   Pulmonary embolus  IVC filter was placed and she was started on Lovenox.  She was subsequently transitioned to warfarin.    03/07/2013 Pathology Results   1. Cervix, biopsy - HIGH GRADE ENDOMETRIAL CARCINOMA, SEE COMMENT. 2. Endometrium, biopsy - HIGH GRADE ENDOMETRIAL CARCINOMA, SEE COMMENT. Microscopic Comment 1. , 2. In both parts 1 and 2, slide sections demonstrate extensive involvement by high grade endometrial carcinoma demonstrating a biphasic epithelial and stromal architecture. The immunophenotype (strong diffuse estrogen receptor; strong diffuse vimentin expression; patchy monoclonal CEA expression, patchy p16 immunostain expression, and diffuse mild to moderate p53 expression) is consistent with primary endometrial origin. 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    03/14/2013 - 01/04/2014 Chemotherapy   taxol carboplatin x 9   08/01/2013 Surgery   Procedure(s) Performed: 1. Exploratory laparotomy with total hysterectomy bilateral salpingo-oophorectomy, omental biopsies.   Surgeon: Francetta Found.  Skeet Latch, MD., PhD   Assistant Surgeon: Lahoma Crocker M.D.    Specimens: Uterus Cervix, Bilateral tubes / ovaries, lower  omentum.     Operative Findings: A 24cm left ovarian mass hemosiderine filled with omental adhesions.  Evidence of chemotherapeutic changes over the bowel.  Uterus 14 cm.  No palpable hepatic mets but the examination was limited by diaphragmatic adhesions.  No gross disease in the omentum. TAH BSO  omentectomy Left ovary 20cm clear cell malignancy  Uterus Carcinosarcoma  Multiple microscopic foci, measuring up to 0.5 cm in greatest dimension. Histologic type: Carcinosarcoma (MMMT) with heterologous component (ossification) Grade: High grade Myometrial invasion: 2.1 cm where myometrium is 2.5 cm in thickness Cervical stromal involvement: No.   08/01/2013 Pathology Results   1. Adnexa - ovary +/- tube, neoplastic, left with omentum - HIGH GRADE CARCINOMA WITH BOTH CLEAR CELL CARCINOMA AND ENDOMETRIOID CARCINOMA COMPONENTS, 20 CM. PLEASE SEE ONCOLOGY TEMPLATE FOR DETAIL. 2. Uterus and cervix, with right fallopian tube and right ovary - MICROSCOPIC FOCI OF RESIDUAL CARCINOSARCOMA WITH HETEROLOGOUS ELEMENT, INVOLVING OUTER HALF OF THE MYOMETRIUM. - EXTENSIVE ADENOMYOSIS AND LEIOMYOMA. - CERVIX: BENIGN SQUAMOUS MUCOSA AND ENDOCERVICAL MUCOSA, NO DYSPLASIA OR MALIGNANCY. - RIGHT OVARY AND FALLOPIAN TUBES: NO PATHOLOGIC ABNORMALITIES. Microscopic Comment  1. OVARY Specimen(s): Left ovary and fallopian tube. Procedure: (including lymph node sampling): Left salpingo-oophorectomy Primary tumor site (including laterality): Left ovary Ovarian surface involvement: Present. Ovarian capsule intact without fragmentation: No. Maximum tumor size (cm): 20 cm, gross measurement. Histologic type: Mixed surface epithelial carcinoma with predominant clear cell carcinoma component (70%) and minor endometrioid component (30%) Please see comment for detail. Grade: High grade Peritoneal implants: (specify invasive or non-invasive): N/A Pelvic extension (list additional structures on separate lines and if involved): No. Lymph nodes: number examined - N/A ; number positive - N/A TNM code:  ypT1c2, pNX FIGO Stage (based on pathologic findings, needs clinical correlation): at least IC2 Comments: Received grossly is a 20 cm focally disrupted and partially collapsed multicystic ovary with attached/adherent  omentum. Microscopically, sectioned show an invasive high grade surface carcinoma arising in a background of endometriotic cyst. The tumor is predominately composed of clear cell carcinoma component with minor endometrioid carcinoma component. No tumor involvement is identified in the adherent omentum tissue. Immunohistochemical stains were performed and the clear cell carcinoma is patchy positive for p16, ER, p53, negative for WT-1 and p63. Focal angiolymphatic invasion is present  The morphologic features are different from those seen in the uterus. 2. ONCOLOGY TABLE-UTERUS, CARCINOSARCOMA Specimen: Uterus, cervix and right fallopian tubes and ovaries. Procedure: Total hysterectomy and right salpingo-oophorectomy. Lymph node sampling performed: No. Specimen integrity: Intact. Maximum tumor size: Multiple microscopic foci, measuring up to 0.5 cm in greatest dimension. Histologic type: Carcinosarcoma (MMMT) with heterologous component (ossification) Grade: High grade Myometrial invasion: 2.1 cm where myometrium is 2.5 cm in thickness Cervical stromal involvement: No. Extent of involvement of other organs: No. Lymph - vascular invasion: Not identified. Peritoneal washings: N/A Lymph nodes: # examined N/A ; # positive N/A Pelvic lymph nodes: N/A involved of N/A lymph nodes. Para-aortic lymph nodes: N/A involved of N/A lymph nodes. Other (specify involvement and site): N/A TNM code: ypT1b, ypNX FIGO Stage (based on pathologic findings, needs clinical correlation): IB Comment: Immunohistochemical stains were performed and the residual carcinoma is positive for p16, negative for WT-1, weakly positive for p53 and ER. The morphologic features are different from those seen in the left ovary. (HCL:gt, 08/04/13)   02/21/2014 - 03/09/2014 Radiation Therapy   Received vaginal brachytherapy TREATMENT DATES:  12/2, 12/7, 12/11, 12/14 and 03/09/14                                          SITE/DOSE:  Vaginal Cuff 4 cm treatment length  / 30 Gy in 5 fractions                      Technique:   HDR with Ir-192        12/17/2016 Relapse/Recurrence   2cm rubbery like mass in the RV septum on physical examination   12/23/2016 Imaging   Reproductive: Previous hysterectomy. New soft tissue mass seen between the vaginal cuff and anterior rectal wall which measures 2.7 x 2.6 cm on image 112/ 2. This is consistent with locally recurrent endometrial carcinoma.   01/05/2017 Imaging   1. Previously noted pelvic mass is similar in size and appearance to the prior examination from 12/23/2016 demonstrating some low-level hypermetabolism. This is nonspecific, and could represent a lesion of rectal, cervical or uterine origin. 2. No definite findings to suggest metastatic disease in the chest, abdomen or pelvis. 3. Small focus of hypermetabolism in the distal third of the esophagus without a perceivable abnormality on the CT portion of the examination. This could represent physiologic activity, a focal area of esophagitis, or an underlying lesion. Clinical correlation is recommended, with consideration for followup upper endoscopy if clinically appropriate. 4. Nonspecific low-level hypermetabolism in a left axillary lymph node which is stable in size and appearance compared to prior examinations. This is favored to be benign, but correlation with routine annual mammography is recommended. 5. Dilatation of the pulmonic trunk (4 cm in diameter), concerning for pulmonary arterial hypertension. 6. Severe  hepatic steatosis.   01/05/2017 PET scan   1. Previously noted pelvic mass is similar in size and appearance to the prior examination from 12/23/2016 demonstrating some low-level hypermetabolism. This is nonspecific, and could represent a lesion of rectal, cervical or uterine origin. 2. No definite findings to suggest metastatic disease in the chest, abdomen or pelvis. 3. Small focus of  hypermetabolism in the distal third of the esophagus without a perceivable abnormality on the CT portion of the examination. This could represent physiologic activity, a focal area of esophagitis, or an underlying lesion. Clinical correlation is recommended, with consideration for followup upper endoscopy if clinically appropriate. 4. Nonspecific low-level hypermetabolism in a left axillary lymph node which is stable in size and appearance compared to prior examinations. This is favored to be benign, but correlation with routine annual mammography is recommended. 5. Dilatation of the pulmonic trunk (4 cm in diameter), concerning for pulmonary arterial hypertension. 6. Severe hepatic steatosis. 7. Aortic atherosclerosis, in addition to left anterior descending coronary artery disease. Please note that although the presence of coronary artery calcium documents the presence of coronary artery disease, the severity of this disease and any potential stenosis cannot be assessed on this non-gated CT examination. Assessment for potential risk factor modification, dietary therapy or pharmacologic therapy may be warranted, if clinically indicated. 8. Additional incidental findings, as above. Aortic Atherosclerosis (ICD10-I70.0).   01/27/2017 Surgery   She underwent diagnostic laparoscopy, robotic assisted resection of intraperitoneal mass, infracolic omentectomy, peritoneal biopsies and rigid proctoscopy. R1 resection with miliary disease remaining in tumor bed   02/04/2017 Pathology Results   A: Cul-de-sac mass, biopsy  - Endometrioid adenocarcinoma, FIGO grade 1 (architecture grade 1, nuclear grade 2) (see comment)  B: Peritoneum, pelvic, biopsy  - Fragments of fibrovascular tissue with hemorrhage - No malignancy identified  C: Cul-de-sac, biopsy  - Endometrioid adenocarcinoma, FIGO grade 1 (architecture grade 1, nuclear grade 2), with squamous differentiation (see comment)  D: Omentum,  omentectomy  - Benign omentum  - No malignancy identified  The purpose of this addendum is to report the results of immunohistochemical stains for mismatch repair proteins and hormone receptors performed on a representative block of tumor.  Immunostains for mismatch repair proteins were performed on block C2 and show intact expression of MLH1, PMS2, MSH2, and MSH6 within the tumor. This is the normal phenotype and does NOT support a diagnosis of microsatellite instability/hereditary non-polyposis colorectal cancer (HNPCC) associated carcinoma. Molecular testing for additional microsatellite instability markers will be performed by the [employer] [phone]) and reported separately.  Estrogen receptor (Ventana, clone SP1):  Positive (90%, weak to moderate)  Progesterone receptor (Ventana, clone [id number]):  Positive (90%, strong)   02/16/2017 Procedure   Successful placement of a right internal jugular approach power injectable Port-A-Cath. The catheter is ready for immediate use.   03/04/2017 - 06/17/2017 Chemotherapy   The patient had Carboplatin and Taxol for chemotherapy treatment.     06/24/2017 -  Anti-estrogen oral therapy   She started letrozole   08/23/2017 Imaging   1. Soft tissue lesion seen posterior to the vagina on the previous PET-CT is not evident today. 2. Trace free fluid in the pelvis. 3. Similar appearance tiny lymph nodes scattered along both common and external iliac chains. 4. 12 mm low-density lesion in the central spleen appears stable. Attention on follow-up recommended.   03/2018 Imaging   CT Abd/Pelvis Other: Small right inguinal hernia contains fat. No free fluid. Soft tissue extending between the vaginal cuff  and rectum (series 2, image 80) may be minimally more prominent, currently measuring 10 mm.   Musculoskeletal: Degenerative changes in the spine. No worrisome lytic or sclerotic lesions.   IMPRESSION: 1. Soft tissue extending  between the vaginal cuff and rectum may be minimally thicker. Continued attention on follow-up exams is warranted. 2. Subtle low-density lesion in the spleen is unchanged. 3.  Aortic atherosclerosis (ICD10-170.0).       07/27/2018 Imaging   CT A/P IMPRESSION: 1. Stable exam.  No acute findings. 2. No evidence of recurrent or metastatic carcinoma within the abdomen or pelvis. 3. Hepatic steatosis.   Aortic Atherosclerosis   09/01/2018 Procedure   Removal of implanted Port-A-Cath utilizing sharp and blunt dissection. The procedure was uncomplicated   0/35/0093 Imaging   1. Stable CT of the chest, abdomen and pelvis. No findings to suggest recurrent or metastatic disease. 2. Hepatic steatosis. 3. Coronary artery calcifications. 4. Aortic atherosclerosis.   Aortic Atherosclerosis (ICD10-I70.0).   08/21/2021 Tumor Marker   Patient's tumor was tested for the following markers: CA-125. Results of the tumor marker test revealed 12.9.   08/28/2021 Imaging   1. Stable examination without new or progressive findings to suggest local recurrence or active metastatic disease in the chest abdomen or pelvis. 2. Hepatic steatosis. 3. Scattered colonic diverticulosis without findings of acute diverticulitis. 4. Coronary artery calcifications andaortic Atherosclerosis (ICD10-I70.0).       PHYSICAL EXAMINATION: ECOG PERFORMANCE STATUS: 0 - Asymptomatic  Vitals:   08/28/21 0915  BP: 127/75  Pulse: 69  Resp: 18  Temp: 99.1 F (37.3 C)  SpO2: 95%   Filed Weights   08/28/21 0915  Weight: 283 lb 12.8 oz (128.7 kg)    GENERAL:alert, no distress and comfortable NEURO: alert & oriented x 3 with fluent speech, no focal motor/sensory deficits  LABORATORY DATA:  I have reviewed the data as listed    Component Value Date/Time   NA 139 08/19/2021 1336   NA 137 03/25/2017 0956   K 3.5 08/19/2021 1336   K 4.1 03/25/2017 0956   CL 102 08/19/2021 1336   CO2 27 08/19/2021 1336   CO2 25  03/25/2017 0956   GLUCOSE 96 08/19/2021 1336   GLUCOSE 180 (H) 03/25/2017 0956   BUN 24 (H) 08/19/2021 1336   BUN 17.3 03/25/2017 0956   CREATININE 1.43 (H) 08/19/2021 1336   CREATININE 1.1 03/25/2017 0956   CALCIUM 7.7 (L) 08/19/2021 1336   CALCIUM 9.4 03/25/2017 0956   PROT 8.8 (H) 08/19/2021 1336   PROT 9.1 (H) 03/25/2017 0956   ALBUMIN 4.1 08/19/2021 1336   ALBUMIN 3.6 03/25/2017 0956   AST 27 08/19/2021 1336   AST 22 03/25/2017 0956   ALT 17 08/19/2021 1336   ALT 24 03/25/2017 0956   ALKPHOS 76 08/19/2021 1336   ALKPHOS 100 03/25/2017 0956   BILITOT 0.7 08/19/2021 1336   BILITOT 0.37 03/25/2017 0956   GFRNONAA 43 (L) 08/19/2021 1336   GFRAA 38 (L) 09/06/2019 0811    No results found for: "SPEP", "UPEP"  Lab Results  Component Value Date   WBC 6.6 08/19/2021   NEUTROABS 4.8 08/19/2021   HGB 9.7 (L) 08/19/2021   HCT 28.3 (L) 08/19/2021   MCV 91.3 08/19/2021   PLT 181 08/19/2021      Chemistry      Component Value Date/Time   NA 139 08/19/2021 1336   NA 137 03/25/2017 0956   K 3.5 08/19/2021 1336   K 4.1 03/25/2017 0956   CL  102 08/19/2021 1336   CO2 27 08/19/2021 1336   CO2 25 03/25/2017 0956   BUN 24 (H) 08/19/2021 1336   BUN 17.3 03/25/2017 0956   CREATININE 1.43 (H) 08/19/2021 1336   CREATININE 1.1 03/25/2017 0956      Component Value Date/Time   CALCIUM 7.7 (L) 08/19/2021 1336   CALCIUM 9.4 03/25/2017 0956   ALKPHOS 76 08/19/2021 1336   ALKPHOS 100 03/25/2017 0956   AST 27 08/19/2021 1336   AST 22 03/25/2017 0956   ALT 17 08/19/2021 1336   ALT 24 03/25/2017 0956   BILITOT 0.7 08/19/2021 1336   BILITOT 0.37 03/25/2017 0956       RADIOGRAPHIC STUDIES:we reviewed multiple imaging studies I have personally reviewed the radiological images as listed and agreed with the findings in the report. CT CHEST WO CONTRAST  Result Date: 08/27/2021 CLINICAL DATA:  History of ovarian/uterine cancer, uterine carcinosarcoma, endometrial cancer, rectal cancer.  Chemo radiation complete. Currently on Femara for recurrent low-grade endometrioid cancer* Tracking Code: BO * EXAM: CT CHEST, ABDOMEN AND PELVIS WITHOUT CONTRAST TECHNIQUE: Multidetector CT imaging of the chest, abdomen and pelvis was performed following the standard protocol without IV contrast. RADIATION DOSE REDUCTION: This exam was performed according to the departmental dose-optimization program which includes automated exposure control, adjustment of the mA and/or kV according to patient size and/or use of iterative reconstruction technique. COMPARISON:  Multiple priors including most recent CT September 07, 2019. FINDINGS: CT CHEST FINDINGS Cardiovascular: Aortic atherosclerosis without aneurysmal dilation. Normal size heart. Calcifications of the mitral annulus. No significant pericardial effusion/thickening. Mediastinum/Nodes: Unchanged size of the 4.8 cm nodule in left lobe of the thyroid previously biopsied with pathologic diagnosis of benign follicular nodule. Not clinically significant; no follow-up imaging recommended (ref: J Am Coll Radiol. 2015 Feb;12(2): 143-50).Similar prominent bilateral axillary lymph nodes not pathologically enlarged by size criteria. No pathologically enlarged mediastinal, hilar or axillary lymph nodes, noting limited sensitivity for the detection of hilar adenopathy on this noncontrast study. Patulous esophagus with reflux versus retained material in the distal esophagus. Lungs/Pleura: No suspicious pulmonary nodules or masses. Patchy ground-glass opacities in the bilateral lung bases are favored infectious or inflammatory. No pleural effusion. No pneumothorax. Musculoskeletal: No chest wall mass or suspicious bone lesions identified. CT ABDOMEN PELVIS FINDINGS Hepatobiliary: Diffuse hepatic steatosis. No suspicious hepatic lesion on this noncontrast examination. Gallbladder is unremarkable. No biliary ductal dilation. Pancreas: No pancreatic ductal dilation or evidence of acute  inflammation. Spleen: No splenomegaly. Ill-defined hypodense 12 mm splenic lesion is unchanged and favored benign. Adrenals/Urinary Tract: Bilateral adrenal glands appear normal. No hydronephrosis. No renal, ureteral or bladder calculi. Urinary bladder is unremarkable for degree of distension. Stomach/Bowel: Radiopaque enteric contrast material traverses the descending colon. Stomach is unremarkable for degree of distension. No pathologic dilation of small or large bowel. Normal appendix. No evidence of acute bowel inflammation. Vascular/Lymphatic: Aortic and branch vessel atherosclerosis without abdominal aortic aneurysm. Infrarenal IVC filter. Prominent pelvic lymph nodes are similar to prior with a right external iliac lymph node measuring 8 mm in short axis on image 108/2 previously 9 mm. No pathologically enlarged abdominal or pelvic lymph nodes. Reproductive: Uterus is surgically absent, without soft tissue nodularity along the vaginal cuff. No suspicious adnexal mass. Other: No significant abdominopelvic free fluid. No discrete peritoneal or omental nodularity. Postsurgical change in the anterior abdominal wall. Sequela of subcutaneous injections. Musculoskeletal: No aggressive lytic or blastic lesion of bone. IMPRESSION: 1. Stable examination without new or progressive findings to suggest local recurrence  or active metastatic disease in the chest abdomen or pelvis. 2. Hepatic steatosis. 3. Scattered colonic diverticulosis without findings of acute diverticulitis. 4. Coronary artery calcifications andaortic Atherosclerosis (ICD10-I70.0). Electronically Signed   By: Dahlia Bailiff M.D.   On: 08/27/2021 15:42   CT ABDOMEN PELVIS WO CONTRAST  Result Date: 08/27/2021 CLINICAL DATA:  History of ovarian/uterine cancer, uterine carcinosarcoma, endometrial cancer, rectal cancer. Chemo radiation complete. Currently on Femara for recurrent low-grade endometrioid cancer* Tracking Code: BO * EXAM: CT CHEST, ABDOMEN  AND PELVIS WITHOUT CONTRAST TECHNIQUE: Multidetector CT imaging of the chest, abdomen and pelvis was performed following the standard protocol without IV contrast. RADIATION DOSE REDUCTION: This exam was performed according to the departmental dose-optimization program which includes automated exposure control, adjustment of the mA and/or kV according to patient size and/or use of iterative reconstruction technique. COMPARISON:  Multiple priors including most recent CT September 07, 2019. FINDINGS: CT CHEST FINDINGS Cardiovascular: Aortic atherosclerosis without aneurysmal dilation. Normal size heart. Calcifications of the mitral annulus. No significant pericardial effusion/thickening. Mediastinum/Nodes: Unchanged size of the 4.8 cm nodule in left lobe of the thyroid previously biopsied with pathologic diagnosis of benign follicular nodule. Not clinically significant; no follow-up imaging recommended (ref: J Am Coll Radiol. 2015 Feb;12(2): 143-50).Similar prominent bilateral axillary lymph nodes not pathologically enlarged by size criteria. No pathologically enlarged mediastinal, hilar or axillary lymph nodes, noting limited sensitivity for the detection of hilar adenopathy on this noncontrast study. Patulous esophagus with reflux versus retained material in the distal esophagus. Lungs/Pleura: No suspicious pulmonary nodules or masses. Patchy ground-glass opacities in the bilateral lung bases are favored infectious or inflammatory. No pleural effusion. No pneumothorax. Musculoskeletal: No chest wall mass or suspicious bone lesions identified. CT ABDOMEN PELVIS FINDINGS Hepatobiliary: Diffuse hepatic steatosis. No suspicious hepatic lesion on this noncontrast examination. Gallbladder is unremarkable. No biliary ductal dilation. Pancreas: No pancreatic ductal dilation or evidence of acute inflammation. Spleen: No splenomegaly. Ill-defined hypodense 12 mm splenic lesion is unchanged and favored benign. Adrenals/Urinary  Tract: Bilateral adrenal glands appear normal. No hydronephrosis. No renal, ureteral or bladder calculi. Urinary bladder is unremarkable for degree of distension. Stomach/Bowel: Radiopaque enteric contrast material traverses the descending colon. Stomach is unremarkable for degree of distension. No pathologic dilation of small or large bowel. Normal appendix. No evidence of acute bowel inflammation. Vascular/Lymphatic: Aortic and branch vessel atherosclerosis without abdominal aortic aneurysm. Infrarenal IVC filter. Prominent pelvic lymph nodes are similar to prior with a right external iliac lymph node measuring 8 mm in short axis on image 108/2 previously 9 mm. No pathologically enlarged abdominal or pelvic lymph nodes. Reproductive: Uterus is surgically absent, without soft tissue nodularity along the vaginal cuff. No suspicious adnexal mass. Other: No significant abdominopelvic free fluid. No discrete peritoneal or omental nodularity. Postsurgical change in the anterior abdominal wall. Sequela of subcutaneous injections. Musculoskeletal: No aggressive lytic or blastic lesion of bone. IMPRESSION: 1. Stable examination without new or progressive findings to suggest local recurrence or active metastatic disease in the chest abdomen or pelvis. 2. Hepatic steatosis. 3. Scattered colonic diverticulosis without findings of acute diverticulitis. 4. Coronary artery calcifications andaortic Atherosclerosis (ICD10-I70.0). Electronically Signed   By: Dahlia Bailiff M.D.   On: 08/27/2021 15:42

## 2021-08-28 NOTE — Assessment & Plan Note (Signed)
She is at risk of osteoporosis DEXA is pending, we discussed importance of vitamin D and calcium replacement

## 2021-08-29 NOTE — Telephone Encounter (Signed)
Spoke with the patient and scheduled a follow up appt for 7/19 with Dr Delsa Sale

## 2021-09-04 ENCOUNTER — Ambulatory Visit
Admission: RE | Admit: 2021-09-04 | Discharge: 2021-09-04 | Disposition: A | Discharge status: Home / Self Care | Payer: Medicare HMO | Source: Ambulatory Visit | Attending: Hematology and Oncology | Admitting: Hematology and Oncology

## 2021-09-04 DIAGNOSIS — Z780 Asymptomatic menopausal state: Secondary | ICD-10-CM

## 2021-09-05 ENCOUNTER — Telehealth: Payer: Self-pay

## 2021-09-05 NOTE — Telephone Encounter (Signed)
-----   Message from Heath Lark, MD sent at 09/05/2021  9:34 AM EDT ----- Pls call her Bone density scan result is good

## 2021-09-05 NOTE — Telephone Encounter (Signed)
Called and left below message that bone density is good. Ask her to call the office with questions.

## 2021-10-08 ENCOUNTER — Inpatient Hospital Stay: Payer: Medicare HMO | Admitting: Obstetrics & Gynecology

## 2021-10-09 ENCOUNTER — Encounter: Payer: Self-pay | Admitting: Obstetrics & Gynecology

## 2021-10-15 ENCOUNTER — Inpatient Hospital Stay: Payer: Medicare HMO | Attending: Hematology and Oncology | Admitting: Obstetrics & Gynecology

## 2021-10-15 ENCOUNTER — Encounter: Payer: Self-pay | Admitting: Obstetrics & Gynecology

## 2021-10-15 ENCOUNTER — Other Ambulatory Visit: Payer: Self-pay

## 2021-10-15 VITALS — BP 134/58 | HR 81 | Temp 99.1°F | Resp 16 | Ht 67.0 in | Wt 275.6 lb

## 2021-10-15 DIAGNOSIS — C541 Malignant neoplasm of endometrium: Secondary | ICD-10-CM | POA: Diagnosis not present

## 2021-10-15 DIAGNOSIS — Z9221 Personal history of antineoplastic chemotherapy: Secondary | ICD-10-CM | POA: Diagnosis not present

## 2021-10-15 DIAGNOSIS — Z8543 Personal history of malignant neoplasm of ovary: Secondary | ICD-10-CM

## 2021-10-15 DIAGNOSIS — Z90710 Acquired absence of both cervix and uterus: Secondary | ICD-10-CM | POA: Insufficient documentation

## 2021-10-15 DIAGNOSIS — Z90722 Acquired absence of ovaries, bilateral: Secondary | ICD-10-CM | POA: Insufficient documentation

## 2021-10-15 DIAGNOSIS — Z79811 Long term (current) use of aromatase inhibitors: Secondary | ICD-10-CM | POA: Diagnosis not present

## 2021-10-15 DIAGNOSIS — C562 Malignant neoplasm of left ovary: Secondary | ICD-10-CM

## 2021-10-15 NOTE — Patient Instructions (Signed)
Return in 6 months. Call around November or December to schedule

## 2021-10-15 NOTE — Progress Notes (Addendum)
Follow Up Note: Gyn-Onc  Caitlyn Higgins 56 y.o. female  CC:  She presents for a f/u visit.   HPI: The oncology history was reviewed.  Interval History: She was recently seen in follow up by Dr. Alvy Bimler.  A pelvic exam was recommended.  She denies abdominal distention, pain, weight loss, vaginal bleeding, cough, lethargy or change in her bowel habits.    Review of Systems  Review of Systems  Constitutional:  Negative for malaise/fatigue and weight loss.  Respiratory:  Negative for shortness of breath and wheezing.   Cardiovascular:  Negative for chest pain and leg swelling.  Gastrointestinal:  Negative for abdominal pain, blood in stool, constipation, nausea and vomiting.  Genitourinary:  Negative for dysuria, frequency, hematuria and urgency.  Musculoskeletal:  Negative for joint pain and myalgias.  Neurological:  Negative for weakness.  Psychiatric/Behavioral:  Negative for depression. The patient does not have insomnia.    Current medications, allergy, social history, past surgical history, past medical history, family history were all reviewed.    Vitals:  BP (!) 134/58 (BP Location: Left Arm, Patient Position: Sitting)   Pulse 81   Temp 99.1 F (37.3 C) (Tympanic)   Resp 16   Ht '5\' 7"'$  (1.702 m)   Wt 275 lb 9.6 oz (125 kg)   LMP 03/09/2013   BMI 43.17 kg/m    Physical Exam Exam conducted with a chaperone present.  Constitutional:      General: She is not in acute distress. Cardiovascular:     Rate and Rhythm: Normal rate and regular rhythm.  Pulmonary:     Effort: Pulmonary effort is normal.     Breath sounds: Normal breath sounds. No wheezing or rhonchi.  Abdominal:     Palpations: Abdomen is soft.     Tenderness: There is no abdominal tenderness. There is no right CVA tenderness or left CVA tenderness.     Hernia: No hernia is present.  Genitourinary:    General: Normal vulva.     Urethra: No urethral lesion.     Vagina: No lesions. No  bleeding Musculoskeletal:     Cervical back: Neck supple.     Right lower leg: No edema.     Left lower leg: No edema.  Lymphadenopathy:     Upper Body:     Right upper body: No supraclavicular adenopathy.     Left upper body: No supraclavicular adenopathy.     Lower Body: No right inguinal adenopathy. No left inguinal adenopathy.  Skin:    Findings: No rash.  Neurological:     Mental Status: She is oriented to person, place, and time.   Assessment/Plan:  Caitlyn Higgins is a 56 y.o. 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  Endometrioid recurrence was identified in October 2018.  Surgical resection and chemotherapy with Taxol carboplatin was completed March 2019.  Repeat imaging to date NED    >continue letrazole >return in 6 mos   I personally spent 30 minutes face-to-face and non-face-to-face in the care of this patient, which includes all pre, intra, and post visit time on the date of service.   Lahoma Crocker, MD

## 2022-02-27 ENCOUNTER — Ambulatory Visit: Payer: Medicare HMO | Admitting: Hematology and Oncology

## 2022-02-27 ENCOUNTER — Other Ambulatory Visit: Payer: Medicare HMO

## 2022-04-08 ENCOUNTER — Other Ambulatory Visit: Payer: Self-pay | Admitting: Internal Medicine

## 2022-04-08 DIAGNOSIS — Z1231 Encounter for screening mammogram for malignant neoplasm of breast: Secondary | ICD-10-CM

## 2022-04-09 DIAGNOSIS — H04123 Dry eye syndrome of bilateral lacrimal glands: Secondary | ICD-10-CM | POA: Diagnosis not present

## 2022-04-09 DIAGNOSIS — H2513 Age-related nuclear cataract, bilateral: Secondary | ICD-10-CM | POA: Diagnosis not present

## 2022-04-09 DIAGNOSIS — H40022 Open angle with borderline findings, high risk, left eye: Secondary | ICD-10-CM | POA: Diagnosis not present

## 2022-04-09 DIAGNOSIS — H524 Presbyopia: Secondary | ICD-10-CM | POA: Diagnosis not present

## 2022-04-09 DIAGNOSIS — H5213 Myopia, bilateral: Secondary | ICD-10-CM | POA: Diagnosis not present

## 2022-04-11 DIAGNOSIS — H524 Presbyopia: Secondary | ICD-10-CM | POA: Diagnosis not present

## 2022-04-23 ENCOUNTER — Telehealth: Payer: Self-pay

## 2022-04-23 ENCOUNTER — Other Ambulatory Visit: Payer: Self-pay

## 2022-04-23 DIAGNOSIS — C541 Malignant neoplasm of endometrium: Secondary | ICD-10-CM

## 2022-04-23 MED ORDER — LETROZOLE 2.5 MG PO TABS: 2.5000 mg | ORAL_TABLET | Freq: Every day | ORAL | 0 refills | Status: DC

## 2022-04-23 NOTE — Telephone Encounter (Signed)
Rx sent to her preferred pharmacy.

## 2022-04-23 NOTE — Telephone Encounter (Signed)
You can send 30 pills, no refills

## 2022-04-23 NOTE — Telephone Encounter (Signed)
Called and scheduled appt for lab and MD on 2/20. She was not aware that she missed December appts. She is aware of 2/20 appts.  She has 24 pills left of the Letrozole Rx. It takes 7 days for mail order to come in the mail. She is asking if you can send now or 1 week prior to 2/20 appts.

## 2022-04-24 ENCOUNTER — Telehealth: Payer: Self-pay

## 2022-04-24 NOTE — Telephone Encounter (Signed)
Returned Pts call regarding letrozole rx. Pt states she was contacted by [facility] who stated they needed new rx order.  Called [facility] who confirmed that they received letrozole rx yesterday and rx shipped today.  Relayed above message to Pt who verbalized understanding with no further questions at this time.

## 2022-05-12 ENCOUNTER — Encounter: Payer: Self-pay | Admitting: Hematology and Oncology

## 2022-05-12 ENCOUNTER — Inpatient Hospital Stay (HOSPITAL_BASED_OUTPATIENT_CLINIC_OR_DEPARTMENT_OTHER): Payer: Medicare HMO | Admitting: Hematology and Oncology

## 2022-05-12 ENCOUNTER — Telehealth: Payer: Self-pay

## 2022-05-12 ENCOUNTER — Other Ambulatory Visit: Payer: Self-pay

## 2022-05-12 ENCOUNTER — Inpatient Hospital Stay: Payer: Medicare HMO | Attending: Hematology and Oncology

## 2022-05-12 ENCOUNTER — Inpatient Hospital Stay: Payer: Medicare HMO

## 2022-05-12 VITALS — BP 136/67 | HR 78 | Temp 98.3°F | Resp 18 | Ht 68.0 in | Wt 283.5 lb

## 2022-05-12 DIAGNOSIS — D539 Nutritional anemia, unspecified: Secondary | ICD-10-CM

## 2022-05-12 DIAGNOSIS — Z170 Estrogen receptor positive status [ER+]: Secondary | ICD-10-CM | POA: Diagnosis not present

## 2022-05-12 DIAGNOSIS — C541 Malignant neoplasm of endometrium: Secondary | ICD-10-CM

## 2022-05-12 DIAGNOSIS — E559 Vitamin D deficiency, unspecified: Secondary | ICD-10-CM

## 2022-05-12 DIAGNOSIS — Z79811 Long term (current) use of aromatase inhibitors: Secondary | ICD-10-CM | POA: Diagnosis not present

## 2022-05-12 DIAGNOSIS — Z79899 Other long term (current) drug therapy: Secondary | ICD-10-CM | POA: Insufficient documentation

## 2022-05-12 DIAGNOSIS — N183 Chronic kidney disease, stage 3 unspecified: Secondary | ICD-10-CM

## 2022-05-12 LAB — CBC WITH DIFFERENTIAL (CANCER CENTER ONLY)
Abs Immature Granulocytes: 0.02 10*3/uL (ref 0.00–0.07)
Basophils Absolute: 0 10*3/uL (ref 0.0–0.1)
Basophils Relative: 0 %
Eosinophils Absolute: 0 10*3/uL (ref 0.0–0.5)
Eosinophils Relative: 0 %
HCT: 30.6 % — ABNORMAL LOW (ref 36.0–46.0)
Hemoglobin: 10.5 g/dL — ABNORMAL LOW (ref 12.0–15.0)
Immature Granulocytes: 0 %
Lymphocytes Relative: 21 %
Lymphs Abs: 1.3 10*3/uL (ref 0.7–4.0)
MCH: 31.9 pg (ref 26.0–34.0)
MCHC: 34.3 g/dL (ref 30.0–36.0)
MCV: 93 fL (ref 80.0–100.0)
Monocytes Absolute: 0.4 10*3/uL (ref 0.1–1.0)
Monocytes Relative: 7 %
Neutro Abs: 4.7 10*3/uL (ref 1.7–7.7)
Neutrophils Relative %: 72 %
Platelet Count: 186 10*3/uL (ref 150–400)
RBC: 3.29 MIL/uL — ABNORMAL LOW (ref 3.87–5.11)
RDW: 13.7 % (ref 11.5–15.5)
WBC Count: 6.5 10*3/uL (ref 4.0–10.5)
nRBC: 0 % (ref 0.0–0.2)

## 2022-05-12 LAB — FERRITIN: Ferritin: 232 ng/mL (ref 11–307)

## 2022-05-12 LAB — VITAMIN B12: Vitamin B-12: 183 pg/mL (ref 180–914)

## 2022-05-12 LAB — IRON AND IRON BINDING CAPACITY (CC-WL,HP ONLY)
Iron: 57 ug/dL (ref 28–170)
Saturation Ratios: 20 % (ref 10.4–31.8)
TIBC: 287 ug/dL (ref 250–450)
UIBC: 230 ug/dL (ref 148–442)

## 2022-05-12 LAB — CMP (CANCER CENTER ONLY)
ALT: 20 U/L (ref 0–44)
AST: 30 U/L (ref 15–41)
Albumin: 4.2 g/dL (ref 3.5–5.0)
Alkaline Phosphatase: 78 U/L (ref 38–126)
Anion gap: 9 (ref 5–15)
BUN: 24 mg/dL — ABNORMAL HIGH (ref 6–20)
CO2: 28 mmol/L (ref 22–32)
Calcium: 7.4 mg/dL — ABNORMAL LOW (ref 8.9–10.3)
Chloride: 103 mmol/L (ref 98–111)
Creatinine: 1.26 mg/dL — ABNORMAL HIGH (ref 0.44–1.00)
GFR, Estimated: 49 mL/min — ABNORMAL LOW (ref >60)
Glucose, Bld: 106 mg/dL — ABNORMAL HIGH (ref 70–99)
Potassium: 3.3 mmol/L — ABNORMAL LOW (ref 3.5–5.1)
Sodium: 140 mmol/L (ref 135–145)
Total Bilirubin: 0.6 mg/dL (ref 0.3–1.2)
Total Protein: 8.7 g/dL — ABNORMAL HIGH (ref 6.5–8.1)

## 2022-05-12 LAB — VITAMIN D 25 HYDROXY (VIT D DEFICIENCY, FRACTURES): Vit D, 25-Hydroxy: 55.09 ng/mL (ref 30–100)

## 2022-05-12 MED ORDER — LETROZOLE 2.5 MG PO TABS: 2.5000 mg | ORAL_TABLET | Freq: Every day | ORAL | 0 refills | Status: DC

## 2022-05-12 NOTE — Assessment & Plan Note (Signed)
She has gained a lot of weight since her last visit Unfortunately, letrozole is known to cause weight gain

## 2022-05-12 NOTE — Assessment & Plan Note (Signed)
Clinically, she is not symptomatic We discussed the role of imaging study We discussed the role of continuing maintenance letrozole I will get her scheduled for pelvic exam with GYN Ultimately, she is undecided I refilled her prescription and plan to see her back in 3 months for further follow-up

## 2022-05-12 NOTE — Progress Notes (Signed)
[place] OFFICE PROGRESS NOTE  Patient Care Team: Seward Carol, MD as PCP - General (Internal Medicine) Seward Carol, MD (Internal Medicine)  ASSESSMENT & PLAN:  Endometrial cancer [facility]) Clinically, she is not symptomatic We discussed the role of imaging study We discussed the role of continuing maintenance letrozole I will get her scheduled for pelvic exam with GYN Ultimately, she is undecided I refilled her prescription and plan to see her back in 3 months for further follow-up  Obesity, Class III, BMI 40-49.9 (morbid obesity) ([place]) She has gained a lot of weight since her last visit Unfortunately, letrozole is known to cause weight gain  CKD (chronic kidney disease), stage III ([place]) She will continue risk factor modification  Deficiency anemia She has chronic anemia, could be related to anemia of chronic kidney disease or other reasons I will order vitamin B12 and iron studies  Orders Placed This Encounter  Procedures   Iron and Iron Binding Capacity ([facility] only)    Standing Status:   Future    Number of Occurrences:   1    Standing Expiration Date:   05/13/2023   Ferritin    Standing Status:   Future    Number of Occurrences:   1    Standing Expiration Date:   05/12/2023   Vitamin B12    Standing Status:   Future    Number of Occurrences:   1    Standing Expiration Date:   05/13/2023    All questions were answered. The patient knows to call the clinic with any problems, questions or concerns. The total time spent in the appointment was 30 minutes encounter with patients including review of chart and various tests results, discussions about plan of care and coordination of care plan   Heath Lark, MD 05/12/2022 9:22 AM  INTERVAL HISTORY: Please see below for problem oriented charting. she returns for treatment follow-up on maintenance letrozole She is doing well She missed her appointment recently Denies abnormal vaginal bleeding, changes in  bowel habits or others She has some weight gain, intermittent hot flashes and occasional mood swings but denies depression We spent a lot of time reviewing the rationale of continuing treatment versus stopping  REVIEW OF SYSTEMS:   Constitutional: Denies fevers, chills or abnormal weight loss Eyes: Denies blurriness of vision Ears, nose, mouth, throat, and face: Denies mucositis or sore throat Respiratory: Denies cough, dyspnea or wheezes Cardiovascular: Denies palpitation, chest discomfort or lower extremity swelling Gastrointestinal:  Denies nausea, heartburn or change in bowel habits Skin: Denies abnormal skin rashes Lymphatics: Denies new lymphadenopathy or easy bruising Neurological:Denies numbness, tingling or new weaknesses Behavioral/Psych: Mood is stable, no new changes  All other systems were reviewed with the patient and are negative.  I have reviewed the past medical history, past surgical history, social history and family history with the patient and they are unchanged from previous note.  ALLERGIES:  has No Known Allergies.  MEDICATIONS:  Current Outpatient Medications  Medication Sig Dispense Refill   aspirin 81 MG chewable tablet 1 tab     calcium carbonate (TUMS - DOSED IN MG ELEMENTAL CALCIUM) 500 MG chewable tablet Chew 1 tablet by mouth daily.     cetirizine (ZYRTEC) 10 MG chewable tablet 1 tab     cholecalciferol (VITAMIN D3) 25 MCG (1000 UNIT) tablet Take 2,000 Units by mouth daily.     gabapentin (NEURONTIN) 300 MG capsule TAKE 1 CAPSULE BY MOUTH TWICE A DAY 180 capsule 6   ibuprofen (  ADVIL) 600 MG tablet Take 600 mg by mouth every 4 (four) hours as needed.     latanoprost (XALATAN) 0.005 % ophthalmic solution SMARTSIG:In Eye(s)     letrozole (FEMARA) 2.5 MG tablet Take 1 tablet (2.5 mg total) by mouth daily. 90 tablet 0   olmesartan-hydrochlorothiazide (BENICAR HCT) 20-12.5 MG tablet Take 1 tablet by mouth daily. 30 tablet 3   omeprazole (PRILOSEC) 40 MG  capsule Take 40 mg by mouth daily.     No current facility-administered medications for this visit.    SUMMARY OF ONCOLOGIC HISTORY: Oncology History Overview Note  Recurrent endometrial cancer, strong ER positive 90%, PR 90%, MSI stable Clear cell mixed features of ovaries in 2015 Recurrent disease 2018: low grade endometroid     Endometrial cancer ([place])  02/24/2013 Miscellaneous   Pulmonary embolus  IVC filter was placed and she was started on Lovenox.  She was subsequently transitioned to warfarin.    03/07/2013 Pathology Results   1. Cervix, biopsy - HIGH GRADE ENDOMETRIAL CARCINOMA, SEE COMMENT. 2. Endometrium, biopsy - HIGH GRADE ENDOMETRIAL CARCINOMA, SEE COMMENT. Microscopic Comment 1. , 2. In both parts 1 and 2, slide sections demonstrate extensive involvement by high grade endometrial carcinoma demonstrating a biphasic epithelial and stromal architecture. The immunophenotype (strong diffuse estrogen receptor; strong diffuse vimentin expression; patchy monoclonal CEA expression, patchy p16 immunostain expression, and diffuse mild to moderate p53 expression) is consistent with primary endometrial origin. 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    03/14/2013 - 01/04/2014 Chemotherapy   taxol carboplatin x 9   08/01/2013 Surgery   Procedure(s) Performed: 1. Exploratory laparotomy with total hysterectomy bilateral salpingo-oophorectomy, omental biopsies.   Surgeon: Francetta Found.  Skeet Latch, MD., PhD   Assistant Surgeon: Lahoma Crocker M.D.    Specimens: Uterus Cervix, Bilateral tubes / ovaries, lower  omentum.     Operative Findings: A 24cm left ovarian mass hemosiderine filled with omental adhesions.  Evidence of chemotherapeutic changes over the bowel.  Uterus 14 cm.  No palpable hepatic mets but the examination was limited by  diaphragmatic adhesions.  No gross disease in the omentum. TAH BSO omentectomy Left ovary 20cm clear cell malignancy  Uterus Carcinosarcoma  Multiple microscopic foci, measuring up to 0.5 cm in greatest dimension. Histologic type: Carcinosarcoma (MMMT) with heterologous component (ossification) Grade: High grade Myometrial invasion: 2.1 cm where myometrium is 2.5 cm in thickness Cervical stromal involvement: No.   08/01/2013 Pathology Results   1. Adnexa - ovary +/- tube, neoplastic, left with omentum - HIGH GRADE CARCINOMA WITH BOTH CLEAR CELL CARCINOMA AND ENDOMETRIOID CARCINOMA COMPONENTS, 20 CM. PLEASE SEE ONCOLOGY TEMPLATE FOR DETAIL. 2. Uterus and cervix, with right fallopian tube and right ovary - MICROSCOPIC FOCI OF RESIDUAL CARCINOSARCOMA WITH HETEROLOGOUS ELEMENT, INVOLVING OUTER HALF OF THE MYOMETRIUM. - EXTENSIVE ADENOMYOSIS AND LEIOMYOMA. - CERVIX: BENIGN SQUAMOUS MUCOSA AND ENDOCERVICAL MUCOSA, NO DYSPLASIA OR MALIGNANCY. - RIGHT OVARY AND FALLOPIAN TUBES: NO PATHOLOGIC ABNORMALITIES. Microscopic Comment  1. OVARY Specimen(s): Left ovary and fallopian tube. Procedure: (including lymph node sampling): Left salpingo-oophorectomy Primary tumor site (including laterality): Left ovary Ovarian surface involvement: Present. Ovarian capsule intact without fragmentation: No. Maximum tumor size (cm): 20 cm, gross measurement. Histologic type: Mixed surface epithelial carcinoma with predominant clear cell carcinoma component (70%) and minor endometrioid component (30%) Please see comment for detail. Grade: High grade Peritoneal implants: (specify invasive or non-invasive): N/A Pelvic extension (list  additional structures on separate lines and if involved): No. Lymph nodes: number examined - N/A ; number positive - N/A TNM code: ypT1c2, pNX FIGO Stage (based on pathologic findings, needs clinical correlation): at least IC2 Comments: Received grossly is a 20 cm focally  disrupted and partially collapsed multicystic ovary with attached/adherent omentum. Microscopically, sectioned show an invasive high grade surface carcinoma arising in a background of endometriotic cyst. The tumor is predominately composed of clear cell carcinoma component with minor endometrioid carcinoma component. No tumor involvement is identified in the adherent omentum tissue. Immunohistochemical stains were performed and the clear cell carcinoma is patchy positive for p16, ER, p53, negative for WT-1 and p63. Focal angiolymphatic invasion is present  The morphologic features are different from those seen in the uterus. 2. ONCOLOGY TABLE-UTERUS, CARCINOSARCOMA Specimen: Uterus, cervix and right fallopian tubes and ovaries. Procedure: Total hysterectomy and right salpingo-oophorectomy. Lymph node sampling performed: No. Specimen integrity: Intact. Maximum tumor size: Multiple microscopic foci, measuring up to 0.5 cm in greatest dimension. Histologic type: Carcinosarcoma (MMMT) with heterologous component (ossification) Grade: High grade Myometrial invasion: 2.1 cm where myometrium is 2.5 cm in thickness Cervical stromal involvement: No. Extent of involvement of other organs: No. Lymph - vascular invasion: Not identified. Peritoneal washings: N/A Lymph nodes: # examined N/A ; # positive N/A Pelvic lymph nodes: N/A involved of N/A lymph nodes. Para-aortic lymph nodes: N/A involved of N/A lymph nodes. Other (specify involvement and site): N/A TNM code: ypT1b, ypNX FIGO Stage (based on pathologic findings, needs clinical correlation): IB Comment: Immunohistochemical stains were performed and the residual carcinoma is positive for p16, negative for WT-1, weakly positive for p53 and ER. The morphologic features are different from those seen in the left ovary. (HCL:gt, 08/04/13)   02/21/2014 - 03/09/2014 Radiation Therapy   Received vaginal brachytherapy TREATMENT DATES:  12/2, 12/7, 12/11,  12/14 and 03/09/14                                          SITE/DOSE: Vaginal Cuff 4 cm treatment length  / 30 Gy in 5 fractions                      Technique:   HDR with Ir-192        12/17/2016 Relapse/Recurrence   2cm rubbery like mass in the RV septum on physical examination   12/23/2016 Imaging   Reproductive: Previous hysterectomy. New soft tissue mass seen between the vaginal cuff and anterior rectal wall which measures 2.7 x 2.6 cm on image 112/ 2. This is consistent with locally recurrent endometrial carcinoma.   01/05/2017 Imaging   1. Previously noted pelvic mass is similar in size and appearance to the prior examination from 12/23/2016 demonstrating some low-level hypermetabolism. This is nonspecific, and could represent a lesion of rectal, cervical or uterine origin. 2. No definite findings to suggest metastatic disease in the chest, abdomen or pelvis. 3. Small focus of hypermetabolism in the distal third of the esophagus without a perceivable abnormality on the CT portion of the examination. This could represent physiologic activity, a focal area of esophagitis, or an underlying lesion. Clinical correlation is recommended, with consideration for followup upper endoscopy if clinically appropriate. 4. Nonspecific low-level hypermetabolism in a left axillary lymph node which is stable in size and appearance compared to prior examinations. This is favored to be benign, but correlation with  routine annual mammography is recommended. 5. Dilatation of the pulmonic trunk (4 cm in diameter), concerning for pulmonary arterial hypertension. 6. Severe hepatic steatosis.   01/05/2017 PET scan   1. Previously noted pelvic mass is similar in size and appearance to the prior examination from 12/23/2016 demonstrating some low-level hypermetabolism. This is nonspecific, and could represent a lesion of rectal, cervical or uterine origin. 2. No definite findings to suggest metastatic  disease in the chest, abdomen or pelvis. 3. Small focus of hypermetabolism in the distal third of the esophagus without a perceivable abnormality on the CT portion of the examination. This could represent physiologic activity, a focal area of esophagitis, or an underlying lesion. Clinical correlation is recommended, with consideration for followup upper endoscopy if clinically appropriate. 4. Nonspecific low-level hypermetabolism in a left axillary lymph node which is stable in size and appearance compared to prior examinations. This is favored to be benign, but correlation with routine annual mammography is recommended. 5. Dilatation of the pulmonic trunk (4 cm in diameter), concerning for pulmonary arterial hypertension. 6. Severe hepatic steatosis. 7. Aortic atherosclerosis, in addition to left anterior descending coronary artery disease. Please note that although the presence of coronary artery calcium documents the presence of coronary artery disease, the severity of this disease and any potential stenosis cannot be assessed on this non-gated CT examination. Assessment for potential risk factor modification, dietary therapy or pharmacologic therapy may be warranted, if clinically indicated. 8. Additional incidental findings, as above. Aortic Atherosclerosis (ICD10-I70.0).   01/27/2017 Surgery   She underwent diagnostic laparoscopy, robotic assisted resection of intraperitoneal mass, infracolic omentectomy, peritoneal biopsies and rigid proctoscopy. R1 resection with miliary disease remaining in tumor bed   02/04/2017 Pathology Results   A: Cul-de-sac mass, biopsy  - Endometrioid adenocarcinoma, FIGO grade 1 (architecture grade 1, nuclear grade 2) (see comment)  B: Peritoneum, pelvic, biopsy  - Fragments of fibrovascular tissue with hemorrhage - No malignancy identified  C: Cul-de-sac, biopsy  - Endometrioid adenocarcinoma, FIGO grade 1 (architecture grade 1, nuclear grade 2), with  squamous differentiation (see comment)  D: Omentum, omentectomy  - Benign omentum  - No malignancy identified  The purpose of this addendum is to report the results of immunohistochemical stains for mismatch repair proteins and hormone receptors performed on a representative block of tumor.  Immunostains for mismatch repair proteins were performed on block C2 and show intact expression of MLH1, PMS2, MSH2, and MSH6 within the tumor. This is the normal phenotype and does NOT support a diagnosis of microsatellite instability/hereditary non-polyposis colorectal cancer (HNPCC) associated carcinoma. Molecular testing for additional microsatellite instability markers will be performed by the [employer] [phone]) and reported separately.  Estrogen receptor (Ventana, clone SP1):  Positive (90%, weak to moderate)  Progesterone receptor (Ventana, clone 1E2):  Positive (90%, strong)   02/16/2017 Procedure   Successful placement of a right internal jugular approach power injectable Port-A-Cath. The catheter is ready for immediate use.   03/04/2017 - 06/17/2017 Chemotherapy   The patient had Carboplatin and Taxol for chemotherapy treatment.     06/24/2017 -  Anti-estrogen oral therapy   She started letrozole   08/23/2017 Imaging   1. Soft tissue lesion seen posterior to the vagina on the previous PET-CT is not evident today. 2. Trace free fluid in the pelvis. 3. Similar appearance tiny lymph nodes scattered along both common and external iliac chains. 4. 12 mm low-density lesion in the central spleen appears stable. Attention on follow-up recommended.   03/2018  Imaging   CT Abd/Pelvis Other: Small right inguinal hernia contains fat. No free fluid. Soft tissue extending between the vaginal cuff and rectum (series 2, image 80) may be minimally more prominent, currently measuring 10 mm.   Musculoskeletal: Degenerative changes in the spine. No worrisome lytic or sclerotic  lesions.   IMPRESSION: 1. Soft tissue extending between the vaginal cuff and rectum may be minimally thicker. Continued attention on follow-up exams is warranted. 2. Subtle low-density lesion in the spleen is unchanged. 3.  Aortic atherosclerosis (ICD10-170.0).       07/27/2018 Imaging   CT A/P IMPRESSION: 1. Stable exam.  No acute findings. 2. No evidence of recurrent or metastatic carcinoma within the abdomen or pelvis. 3. Hepatic steatosis.   Aortic Atherosclerosis   09/01/2018 Procedure   Removal of implanted Port-A-Cath utilizing sharp and blunt dissection. The procedure was uncomplicated   [id number] Imaging   1. Stable CT of the chest, abdomen and pelvis. No findings to suggest recurrent or metastatic disease. 2. Hepatic steatosis. 3. Coronary artery calcifications. 4. Aortic atherosclerosis.   Aortic Atherosclerosis (ICD10-I70.0).   08/21/2021 Tumor Marker   Patient's tumor was tested for the following markers: CA-125. Results of the tumor marker test revealed 12.9.   08/28/2021 Imaging   1. Stable examination without new or progressive findings to suggest local recurrence or active metastatic disease in the chest abdomen or pelvis. 2. Hepatic steatosis. 3. Scattered colonic diverticulosis without findings of acute diverticulitis. 4. Coronary artery calcifications andaortic Atherosclerosis (ICD10-I70.0).     09/05/2021 Imaging   Normal based on BMD.   World Health Organization T-score criteria for osteoporosis   Bone density Normal = T-score at or above -1.0 SD.   Low Bone Mass (Osteopenia) = T-score between -1.0 and -2.5 SD.   Osteoporosis = T-score at or below -2.5 SD.   Fracture risk is not increased.     PHYSICAL EXAMINATION: ECOG PERFORMANCE STATUS: 0 - Asymptomatic  Vitals:   05/12/22 0838  BP: 136/67  Pulse: 78  Resp: 18  Temp: 98.3 F (36.8 C)   Filed Weights   05/12/22 0838  Weight: 283 lb 8 oz (128.6 kg)    GENERAL:alert, no  distress and comfortable  NEURO: alert & oriented x 3 with fluent speech, no focal motor/sensory deficits  LABORATORY DATA:  I have reviewed the data as listed    Component Value Date/Time   NA 140 05/12/2022 0816   NA 137 03/25/2017 0956   K 3.3 (L) 05/12/2022 0816   K 4.1 03/25/2017 0956   CL 103 05/12/2022 0816   CO2 28 05/12/2022 0816   CO2 25 03/25/2017 0956   GLUCOSE 106 (H) 05/12/2022 0816   GLUCOSE 180 (H) 03/25/2017 0956   BUN 24 (H) 05/12/2022 0816   BUN 17.3 03/25/2017 0956   CREATININE 1.26 (H) 05/12/2022 0816   CREATININE 1.1 03/25/2017 0956   CALCIUM 7.4 (L) 05/12/2022 0816   CALCIUM 9.4 03/25/2017 0956   PROT 8.7 (H) 05/12/2022 0816   PROT 9.1 (H) 03/25/2017 0956   ALBUMIN 4.2 05/12/2022 0816   ALBUMIN 3.6 03/25/2017 0956   AST 30 05/12/2022 0816   AST 22 03/25/2017 0956   ALT 20 05/12/2022 0816   ALT 24 03/25/2017 0956   ALKPHOS 78 05/12/2022 0816   ALKPHOS 100 03/25/2017 0956   BILITOT 0.6 05/12/2022 0816   BILITOT 0.37 03/25/2017 0956   GFRNONAA 49 (L) 05/12/2022 0816   GFRAA 38 (L) 09/06/2019 0811    No results found  for: "SPEP", "UPEP"  Lab Results  Component Value Date   WBC 6.5 05/12/2022   NEUTROABS 4.7 05/12/2022   HGB 10.5 (L) 05/12/2022   HCT 30.6 (L) 05/12/2022   MCV 93.0 05/12/2022   PLT 186 05/12/2022      Chemistry      Component Value Date/Time   NA 140 05/12/2022 0816   NA 137 03/25/2017 0956   K 3.3 (L) 05/12/2022 0816   K 4.1 03/25/2017 0956   CL 103 05/12/2022 0816   CO2 28 05/12/2022 0816   CO2 25 03/25/2017 0956   BUN 24 (H) 05/12/2022 0816   BUN 17.3 03/25/2017 0956   CREATININE 1.26 (H) 05/12/2022 0816   CREATININE 1.1 03/25/2017 0956      Component Value Date/Time   CALCIUM 7.4 (L) 05/12/2022 0816   CALCIUM 9.4 03/25/2017 0956   ALKPHOS 78 05/12/2022 0816   ALKPHOS 100 03/25/2017 0956   AST 30 05/12/2022 0816   AST 22 03/25/2017 0956   ALT 20 05/12/2022 0816   ALT 24 03/25/2017 0956   BILITOT 0.6  05/12/2022 0816   BILITOT 0.37 03/25/2017 0956

## 2022-05-12 NOTE — Telephone Encounter (Signed)
-----   Message from Heath Lark, MD sent at 05/12/2022  2:46 PM EST ----- Pls let her know iron is ok but B12 is low I suggest she takes OTC B12 supplement 1000 mcg daily

## 2022-05-12 NOTE — Assessment & Plan Note (Signed)
She has chronic anemia, could be related to anemia of chronic kidney disease or other reasons I will order vitamin B12 and iron studies

## 2022-05-12 NOTE — Telephone Encounter (Signed)
Called and given below message. She verbalized understanding. 

## 2022-05-12 NOTE — Assessment & Plan Note (Addendum)
She will continue risk factor modification

## 2022-05-13 ENCOUNTER — Telehealth: Payer: Self-pay | Admitting: *Deleted

## 2022-05-13 NOTE — Telephone Encounter (Signed)
LMOM for the patient to call the office back, patient needs a follow up appt with Dr Delsa Sale

## 2022-05-14 LAB — CA 125: Cancer Antigen (CA) 125: 13.2 U/mL (ref 0.0–38.1)

## 2022-05-14 NOTE — Telephone Encounter (Signed)
Patient called back and scheduled for a follow up on 3/6

## 2022-05-26 ENCOUNTER — Ambulatory Visit
Admission: RE | Admit: 2022-05-26 | Discharge: 2022-05-26 | Disposition: A | Discharge status: Home / Self Care | Payer: Medicare HMO | Source: Ambulatory Visit | Attending: Internal Medicine | Admitting: Internal Medicine

## 2022-05-26 ENCOUNTER — Ambulatory Visit: Payer: Medicare HMO

## 2022-05-26 DIAGNOSIS — Z1231 Encounter for screening mammogram for malignant neoplasm of breast: Secondary | ICD-10-CM | POA: Diagnosis not present

## 2022-05-27 ENCOUNTER — Other Ambulatory Visit: Payer: Self-pay

## 2022-05-27 ENCOUNTER — Encounter: Payer: Self-pay | Admitting: Obstetrics & Gynecology

## 2022-05-27 ENCOUNTER — Inpatient Hospital Stay: Payer: Medicare HMO | Attending: Obstetrics & Gynecology | Admitting: Obstetrics & Gynecology

## 2022-05-27 VITALS — BP 137/64 | HR 72 | Temp 98.6°F | Resp 16 | Ht 68.0 in | Wt 282.6 lb

## 2022-05-27 DIAGNOSIS — Z8542 Personal history of malignant neoplasm of other parts of uterus: Secondary | ICD-10-CM | POA: Diagnosis not present

## 2022-05-27 DIAGNOSIS — Z9221 Personal history of antineoplastic chemotherapy: Secondary | ICD-10-CM | POA: Insufficient documentation

## 2022-05-27 DIAGNOSIS — C55 Malignant neoplasm of uterus, part unspecified: Secondary | ICD-10-CM

## 2022-05-27 DIAGNOSIS — Z8543 Personal history of malignant neoplasm of ovary: Secondary | ICD-10-CM | POA: Insufficient documentation

## 2022-05-27 DIAGNOSIS — C562 Malignant neoplasm of left ovary: Secondary | ICD-10-CM

## 2022-05-27 NOTE — Progress Notes (Signed)
Follow Up Note: Gyn-Onc  Caitlyn Higgins 57 y.o. female  CC:  She presents for a f/u visit.   HPI: The oncology history was reviewed.  Interval History: She was recently seen in follow up by Dr. Alvy Bimler.  A pelvic exam was recommended.  She denies abdominal distention, pain, weight loss, vaginal bleeding, cough, lethargy or change in her bowel habits.    Review of Systems  Review of Systems  Constitutional:  Negative for malaise/fatigue and weight loss.  Respiratory:  Negative for shortness of breath and wheezing.   Cardiovascular:  Negative for chest pain and leg swelling.  Gastrointestinal:  Negative for abdominal pain, blood in stool, constipation, nausea and vomiting.  Genitourinary:  Negative for dysuria, frequency, hematuria and urgency.  Musculoskeletal:  Negative for joint pain and myalgias.  Neurological:  Negative for weakness.  Psychiatric/Behavioral:  Negative for depression. The patient does not have insomnia.    Current medications, allergy, social history, past surgical history, past medical history, family history were all reviewed.    Vitals:  BP 137/64 (BP Location: Left Arm, Patient Position: Sitting)   Pulse 72   Temp 98.6 F (37 C) (Oral)   Resp 16   Ht '5\' 8"'$  (1.727 m)   Wt 282 lb 9.6 oz (128.2 kg)   LMP 03/09/2013   BMI 42.97 kg/m    Physical Exam Exam conducted with a chaperone present.  Constitutional:      General: She is not in acute distress. Cardiovascular:     Rate and Rhythm: Normal rate and regular rhythm.  Pulmonary:     Effort: Pulmonary effort is normal.     Breath sounds: Normal breath sounds. No wheezing or rhonchi.  Abdominal:     Palpations: Abdomen is soft.     Tenderness: There is no abdominal tenderness. There is no right CVA tenderness or left CVA tenderness.     Hernia: No hernia is present.  Genitourinary:    General: Normal vulva.     Urethra: No urethral lesion.     Vagina: No lesions. No  bleeding Musculoskeletal:     Cervical back: Neck supple.     Right lower leg: No edema.     Left lower leg: No edema.  Lymphadenopathy:     Upper Body:     Right upper body: No supraclavicular adenopathy.     Left upper body: No supraclavicular adenopathy.     Lower Body: No right inguinal adenopathy. No left inguinal adenopathy.  Skin:    Findings: No rash.  Neurological:     Mental Status: She is oriented to person, place, and time.   Assessment/Plan:  Caitlyn Higgins is a 57 y.o. 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  Endometrioid recurrence was identified in October 2018.  Surgical resection and chemotherapy with Taxol carboplatin was completed March 2019.  Repeat imaging to date NED   >continue letrazole indefinitely as long as well tolerated >return in 1 yr   I personally spent 25 minutes face-to-face and non-face-to-face in the care of this patient, which includes all pre, intra, and post visit time on the date of service.   Lahoma Crocker, MD

## 2022-05-27 NOTE — Patient Instructions (Addendum)
Return in 1 year ?

## 2022-07-24 ENCOUNTER — Other Ambulatory Visit: Payer: Self-pay | Admitting: Hematology and Oncology

## 2022-07-24 DIAGNOSIS — C541 Malignant neoplasm of endometrium: Secondary | ICD-10-CM

## 2022-07-31 ENCOUNTER — Telehealth: Payer: Self-pay | Admitting: Hematology and Oncology

## 2022-08-04 ENCOUNTER — Other Ambulatory Visit: Payer: Medicare HMO

## 2022-08-04 ENCOUNTER — Ambulatory Visit: Payer: Medicare HMO | Admitting: Hematology and Oncology

## 2022-08-24 ENCOUNTER — Telehealth: Payer: Self-pay

## 2022-08-24 NOTE — Telephone Encounter (Signed)
Called to see if she wanted to reschedule missed appt with Dr. Bertis Ruddy. She saw Dr. Jean RosenthalChristell Constant who will write her prescription for Letrozole. She does not need a follow up with Dr. Bertis Ruddy, she will just keep seeing Dr. Jean RosenthalChristell Constant.

## 2022-09-04 DIAGNOSIS — I700 Atherosclerosis of aorta: Secondary | ICD-10-CM | POA: Diagnosis not present

## 2022-09-04 DIAGNOSIS — E1149 Type 2 diabetes mellitus with other diabetic neurological complication: Secondary | ICD-10-CM | POA: Diagnosis not present

## 2022-09-04 DIAGNOSIS — Z0000 Encounter for general adult medical examination without abnormal findings: Secondary | ICD-10-CM | POA: Diagnosis not present

## 2022-09-04 DIAGNOSIS — Z6841 Body Mass Index (BMI) 40.0 and over, adult: Secondary | ICD-10-CM | POA: Diagnosis not present

## 2022-09-04 DIAGNOSIS — C562 Malignant neoplasm of left ovary: Secondary | ICD-10-CM | POA: Diagnosis not present

## 2022-09-04 DIAGNOSIS — E1169 Type 2 diabetes mellitus with other specified complication: Secondary | ICD-10-CM | POA: Diagnosis not present

## 2022-09-04 DIAGNOSIS — N1831 Chronic kidney disease, stage 3a: Secondary | ICD-10-CM | POA: Diagnosis not present

## 2022-09-04 DIAGNOSIS — E1122 Type 2 diabetes mellitus with diabetic chronic kidney disease: Secondary | ICD-10-CM | POA: Diagnosis not present

## 2022-09-04 DIAGNOSIS — G620 Drug-induced polyneuropathy: Secondary | ICD-10-CM | POA: Diagnosis not present

## 2022-09-08 DIAGNOSIS — R799 Abnormal finding of blood chemistry, unspecified: Secondary | ICD-10-CM | POA: Diagnosis not present

## 2022-10-15 DIAGNOSIS — H40022 Open angle with borderline findings, high risk, left eye: Secondary | ICD-10-CM | POA: Diagnosis not present

## 2022-10-15 DIAGNOSIS — H43391 Other vitreous opacities, right eye: Secondary | ICD-10-CM | POA: Diagnosis not present

## 2023-05-10 ENCOUNTER — Other Ambulatory Visit: Payer: Self-pay | Admitting: Hematology and Oncology

## 2023-05-10 DIAGNOSIS — C541 Malignant neoplasm of endometrium: Secondary | ICD-10-CM

## 2023-05-11 ENCOUNTER — Other Ambulatory Visit: Payer: Self-pay | Admitting: Gynecologic Oncology

## 2023-05-11 DIAGNOSIS — C541 Malignant neoplasm of endometrium: Secondary | ICD-10-CM

## 2023-05-11 MED ORDER — LETROZOLE 2.5 MG PO TABS: 2.5000 mg | ORAL_TABLET | Freq: Every day | ORAL | 4 refills | Status: AC

## 2023-05-11 NOTE — Progress Notes (Signed)
Refill sent in per patient request. Last bone density in 2023 normal.

## 2023-07-05 ENCOUNTER — Other Ambulatory Visit: Payer: Self-pay | Admitting: Internal Medicine

## 2023-07-05 DIAGNOSIS — Z1231 Encounter for screening mammogram for malignant neoplasm of breast: Secondary | ICD-10-CM

## 2023-07-26 ENCOUNTER — Ambulatory Visit

## 2023-07-28 ENCOUNTER — Ambulatory Visit
Admission: RE | Admit: 2023-07-28 | Discharge: 2023-07-28 | Disposition: A | Discharge status: Home / Self Care | Source: Ambulatory Visit | Attending: Internal Medicine | Admitting: Internal Medicine

## 2023-07-28 DIAGNOSIS — Z1231 Encounter for screening mammogram for malignant neoplasm of breast: Secondary | ICD-10-CM | POA: Diagnosis not present

## 2023-09-21 DIAGNOSIS — H04123 Dry eye syndrome of bilateral lacrimal glands: Secondary | ICD-10-CM | POA: Diagnosis not present

## 2023-09-21 DIAGNOSIS — H40022 Open angle with borderline findings, high risk, left eye: Secondary | ICD-10-CM | POA: Diagnosis not present

## 2023-09-21 DIAGNOSIS — H43391 Other vitreous opacities, right eye: Secondary | ICD-10-CM | POA: Diagnosis not present

## 2023-09-21 DIAGNOSIS — H5213 Myopia, bilateral: Secondary | ICD-10-CM | POA: Diagnosis not present

## 2023-09-21 DIAGNOSIS — H2513 Age-related nuclear cataract, bilateral: Secondary | ICD-10-CM | POA: Diagnosis not present

## 2023-09-21 DIAGNOSIS — H524 Presbyopia: Secondary | ICD-10-CM | POA: Diagnosis not present

## 2023-09-21 DIAGNOSIS — H52223 Regular astigmatism, bilateral: Secondary | ICD-10-CM | POA: Diagnosis not present

## 2023-11-17 DIAGNOSIS — I700 Atherosclerosis of aorta: Secondary | ICD-10-CM | POA: Diagnosis not present

## 2023-11-17 DIAGNOSIS — I10 Essential (primary) hypertension: Secondary | ICD-10-CM | POA: Diagnosis not present

## 2023-11-17 DIAGNOSIS — E1122 Type 2 diabetes mellitus with diabetic chronic kidney disease: Secondary | ICD-10-CM | POA: Diagnosis not present

## 2023-11-17 DIAGNOSIS — N1831 Chronic kidney disease, stage 3a: Secondary | ICD-10-CM | POA: Diagnosis not present

## 2023-11-17 DIAGNOSIS — E1140 Type 2 diabetes mellitus with diabetic neuropathy, unspecified: Secondary | ICD-10-CM | POA: Diagnosis not present

## 2023-11-17 DIAGNOSIS — G629 Polyneuropathy, unspecified: Secondary | ICD-10-CM | POA: Diagnosis not present

## 2023-11-17 DIAGNOSIS — Z0000 Encounter for general adult medical examination without abnormal findings: Secondary | ICD-10-CM | POA: Diagnosis not present

## 2023-11-17 DIAGNOSIS — M79674 Pain in right toe(s): Secondary | ICD-10-CM | POA: Diagnosis not present
